# Patient Record
Sex: Male | Born: 1965 | Race: Black or African American | Hispanic: No | Smoking: Former smoker
Health system: Southern US, Community
[De-identification: ages and names within clinical notes are randomized; demographics above are authoritative.]

## PROBLEM LIST (undated history)

## (undated) ENCOUNTER — Emergency Department (HOSPITAL_COMMUNITY): Admission: EM | Payer: Medicare HMO | Source: Home / Self Care

## (undated) DIAGNOSIS — N189 Chronic kidney disease, unspecified: Secondary | ICD-10-CM

## (undated) DIAGNOSIS — E119 Type 2 diabetes mellitus without complications: Secondary | ICD-10-CM

## (undated) DIAGNOSIS — E7800 Pure hypercholesterolemia, unspecified: Secondary | ICD-10-CM

## (undated) DIAGNOSIS — I10 Essential (primary) hypertension: Secondary | ICD-10-CM

## (undated) HISTORY — DX: Chronic kidney disease, unspecified: N18.9

---

## 2018-07-31 HISTORY — PX: TRANSURETHRAL RESECTION OF PROSTATE: SHX73

## 2021-05-16 ENCOUNTER — Observation Stay (HOSPITAL_COMMUNITY): Payer: Medicare HMO

## 2021-05-16 ENCOUNTER — Emergency Department (HOSPITAL_COMMUNITY): Payer: Medicare HMO

## 2021-05-16 ENCOUNTER — Inpatient Hospital Stay (HOSPITAL_COMMUNITY)
Admission: EM | Admit: 2021-05-16 | Discharge: 2021-05-20 | DRG: 065 | Disposition: A | Discharge status: Rehabilitation Facility | Payer: Medicare HMO | Attending: Family Medicine | Admitting: Family Medicine

## 2021-05-16 DIAGNOSIS — N401 Enlarged prostate with lower urinary tract symptoms: Secondary | ICD-10-CM | POA: Diagnosis present

## 2021-05-16 DIAGNOSIS — R29898 Other symptoms and signs involving the musculoskeletal system: Secondary | ICD-10-CM | POA: Diagnosis not present

## 2021-05-16 DIAGNOSIS — G8194 Hemiplegia, unspecified affecting left nondominant side: Secondary | ICD-10-CM | POA: Diagnosis present

## 2021-05-16 DIAGNOSIS — I639 Cerebral infarction, unspecified: Principal | ICD-10-CM

## 2021-05-16 DIAGNOSIS — G4733 Obstructive sleep apnea (adult) (pediatric): Secondary | ICD-10-CM | POA: Diagnosis present

## 2021-05-16 DIAGNOSIS — E1165 Type 2 diabetes mellitus with hyperglycemia: Secondary | ICD-10-CM | POA: Diagnosis not present

## 2021-05-16 DIAGNOSIS — Z833 Family history of diabetes mellitus: Secondary | ICD-10-CM

## 2021-05-16 DIAGNOSIS — I6381 Other cerebral infarction due to occlusion or stenosis of small artery: Secondary | ICD-10-CM | POA: Diagnosis not present

## 2021-05-16 DIAGNOSIS — I421 Obstructive hypertrophic cardiomyopathy: Secondary | ICD-10-CM | POA: Diagnosis not present

## 2021-05-16 DIAGNOSIS — E785 Hyperlipidemia, unspecified: Secondary | ICD-10-CM | POA: Diagnosis present

## 2021-05-16 DIAGNOSIS — I651 Occlusion and stenosis of basilar artery: Secondary | ICD-10-CM | POA: Diagnosis not present

## 2021-05-16 DIAGNOSIS — R1313 Dysphagia, pharyngeal phase: Secondary | ICD-10-CM | POA: Diagnosis present

## 2021-05-16 DIAGNOSIS — E1169 Type 2 diabetes mellitus with other specified complication: Secondary | ICD-10-CM | POA: Diagnosis not present

## 2021-05-16 DIAGNOSIS — Z23 Encounter for immunization: Secondary | ICD-10-CM

## 2021-05-16 DIAGNOSIS — Z7984 Long term (current) use of oral hypoglycemic drugs: Secondary | ICD-10-CM

## 2021-05-16 DIAGNOSIS — I69952 Hemiplegia and hemiparesis following unspecified cerebrovascular disease affecting left dominant side: Secondary | ICD-10-CM | POA: Diagnosis not present

## 2021-05-16 DIAGNOSIS — I6522 Occlusion and stenosis of left carotid artery: Secondary | ICD-10-CM | POA: Diagnosis not present

## 2021-05-16 DIAGNOSIS — Z8249 Family history of ischemic heart disease and other diseases of the circulatory system: Secondary | ICD-10-CM | POA: Diagnosis not present

## 2021-05-16 DIAGNOSIS — E669 Obesity, unspecified: Secondary | ICD-10-CM | POA: Diagnosis not present

## 2021-05-16 DIAGNOSIS — Z6841 Body Mass Index (BMI) 40.0 and over, adult: Secondary | ICD-10-CM | POA: Diagnosis not present

## 2021-05-16 DIAGNOSIS — R471 Dysarthria and anarthria: Secondary | ICD-10-CM | POA: Diagnosis present

## 2021-05-16 DIAGNOSIS — G459 Transient cerebral ischemic attack, unspecified: Secondary | ICD-10-CM | POA: Diagnosis not present

## 2021-05-16 DIAGNOSIS — I517 Cardiomegaly: Secondary | ICD-10-CM | POA: Diagnosis not present

## 2021-05-16 DIAGNOSIS — R300 Dysuria: Secondary | ICD-10-CM | POA: Diagnosis present

## 2021-05-16 DIAGNOSIS — R29705 NIHSS score 5: Secondary | ICD-10-CM | POA: Diagnosis present

## 2021-05-16 DIAGNOSIS — E119 Type 2 diabetes mellitus without complications: Secondary | ICD-10-CM

## 2021-05-16 DIAGNOSIS — R4781 Slurred speech: Secondary | ICD-10-CM | POA: Diagnosis not present

## 2021-05-16 DIAGNOSIS — E1122 Type 2 diabetes mellitus with diabetic chronic kidney disease: Secondary | ICD-10-CM | POA: Diagnosis present

## 2021-05-16 DIAGNOSIS — N1832 Chronic kidney disease, stage 3b: Secondary | ICD-10-CM | POA: Diagnosis not present

## 2021-05-16 DIAGNOSIS — Z4659 Encounter for fitting and adjustment of other gastrointestinal appliance and device: Secondary | ICD-10-CM | POA: Diagnosis not present

## 2021-05-16 DIAGNOSIS — M109 Gout, unspecified: Secondary | ICD-10-CM | POA: Diagnosis not present

## 2021-05-16 DIAGNOSIS — I69354 Hemiplegia and hemiparesis following cerebral infarction affecting left non-dominant side: Secondary | ICD-10-CM

## 2021-05-16 DIAGNOSIS — R062 Wheezing: Secondary | ICD-10-CM | POA: Diagnosis not present

## 2021-05-16 DIAGNOSIS — I69322 Dysarthria following cerebral infarction: Secondary | ICD-10-CM | POA: Diagnosis not present

## 2021-05-16 DIAGNOSIS — R2681 Unsteadiness on feet: Secondary | ICD-10-CM | POA: Diagnosis present

## 2021-05-16 DIAGNOSIS — Z452 Encounter for adjustment and management of vascular access device: Secondary | ICD-10-CM | POA: Diagnosis not present

## 2021-05-16 DIAGNOSIS — R4701 Aphasia: Secondary | ICD-10-CM | POA: Diagnosis present

## 2021-05-16 DIAGNOSIS — Z87891 Personal history of nicotine dependence: Secondary | ICD-10-CM

## 2021-05-16 DIAGNOSIS — G9349 Other encephalopathy: Secondary | ICD-10-CM | POA: Diagnosis present

## 2021-05-16 DIAGNOSIS — I10 Essential (primary) hypertension: Secondary | ICD-10-CM | POA: Diagnosis present

## 2021-05-16 DIAGNOSIS — I6389 Other cerebral infarction: Secondary | ICD-10-CM | POA: Diagnosis not present

## 2021-05-16 DIAGNOSIS — Z751 Person awaiting admission to adequate facility elsewhere: Secondary | ICD-10-CM | POA: Diagnosis not present

## 2021-05-16 DIAGNOSIS — R0689 Other abnormalities of breathing: Secondary | ICD-10-CM | POA: Diagnosis not present

## 2021-05-16 DIAGNOSIS — I129 Hypertensive chronic kidney disease with stage 1 through stage 4 chronic kidney disease, or unspecified chronic kidney disease: Secondary | ICD-10-CM | POA: Diagnosis present

## 2021-05-16 DIAGNOSIS — Z20822 Contact with and (suspected) exposure to covid-19: Secondary | ICD-10-CM | POA: Diagnosis not present

## 2021-05-16 DIAGNOSIS — Z79899 Other long term (current) drug therapy: Secondary | ICD-10-CM

## 2021-05-16 DIAGNOSIS — I69391 Dysphagia following cerebral infarction: Secondary | ICD-10-CM

## 2021-05-16 DIAGNOSIS — E7800 Pure hypercholesterolemia, unspecified: Secondary | ICD-10-CM | POA: Diagnosis present

## 2021-05-16 DIAGNOSIS — Z886 Allergy status to analgesic agent status: Secondary | ICD-10-CM

## 2021-05-16 DIAGNOSIS — I63219 Cerebral infarction due to unspecified occlusion or stenosis of unspecified vertebral arteries: Secondary | ICD-10-CM | POA: Diagnosis not present

## 2021-05-16 DIAGNOSIS — R531 Weakness: Secondary | ICD-10-CM | POA: Diagnosis not present

## 2021-05-16 DIAGNOSIS — I63011 Cerebral infarction due to thrombosis of right vertebral artery: Secondary | ICD-10-CM | POA: Diagnosis not present

## 2021-05-16 HISTORY — DX: Essential (primary) hypertension: I10

## 2021-05-16 HISTORY — DX: Type 2 diabetes mellitus without complications: E11.9

## 2021-05-16 HISTORY — DX: Pure hypercholesterolemia, unspecified: E78.00

## 2021-05-16 LAB — DIFFERENTIAL
Abs Immature Granulocytes: 0.02 10*3/uL (ref 0.00–0.07)
Basophils Absolute: 0.1 10*3/uL (ref 0.0–0.1)
Basophils Relative: 1 %
Eosinophils Absolute: 0.2 10*3/uL (ref 0.0–0.5)
Eosinophils Relative: 3 %
Immature Granulocytes: 0 %
Lymphocytes Relative: 29 %
Lymphs Abs: 2.4 10*3/uL (ref 0.7–4.0)
Monocytes Absolute: 0.7 10*3/uL (ref 0.1–1.0)
Monocytes Relative: 9 %
Neutro Abs: 5 10*3/uL (ref 1.7–7.7)
Neutrophils Relative %: 58 %

## 2021-05-16 LAB — URINALYSIS, ROUTINE W REFLEX MICROSCOPIC
Bacteria, UA: NONE SEEN
Bilirubin Urine: NEGATIVE
Glucose, UA: NEGATIVE mg/dL
Ketones, ur: NEGATIVE mg/dL
Leukocytes,Ua: NEGATIVE
Nitrite: NEGATIVE
Protein, ur: 30 mg/dL — AB
Specific Gravity, Urine: 1.009 (ref 1.005–1.030)
pH: 8 (ref 5.0–8.0)

## 2021-05-16 LAB — CBG MONITORING, ED
Glucose-Capillary: 126 mg/dL — ABNORMAL HIGH (ref 70–99)
Glucose-Capillary: 97 mg/dL (ref 70–99)

## 2021-05-16 LAB — PROTIME-INR
INR: 1 (ref 0.8–1.2)
Prothrombin Time: 13 seconds (ref 11.4–15.2)

## 2021-05-16 LAB — CBC
HCT: 41.6 % (ref 39.0–52.0)
Hemoglobin: 12.8 g/dL — ABNORMAL LOW (ref 13.0–17.0)
MCH: 24.8 pg — ABNORMAL LOW (ref 26.0–34.0)
MCHC: 30.8 g/dL (ref 30.0–36.0)
MCV: 80.5 fL (ref 80.0–100.0)
Platelets: 382 10*3/uL (ref 150–400)
RBC: 5.17 MIL/uL (ref 4.22–5.81)
RDW: 14.9 % (ref 11.5–15.5)
WBC: 8.5 10*3/uL (ref 4.0–10.5)
nRBC: 0 % (ref 0.0–0.2)

## 2021-05-16 LAB — COMPREHENSIVE METABOLIC PANEL
ALT: 17 U/L (ref 0–44)
AST: 24 U/L (ref 15–41)
Albumin: 3.6 g/dL (ref 3.5–5.0)
Alkaline Phosphatase: 56 U/L (ref 38–126)
Anion gap: 7 (ref 5–15)
BUN: 30 mg/dL — ABNORMAL HIGH (ref 6–20)
CO2: 27 mmol/L (ref 22–32)
Calcium: 8.2 mg/dL — ABNORMAL LOW (ref 8.9–10.3)
Chloride: 105 mmol/L (ref 98–111)
Creatinine, Ser: 2.07 mg/dL — ABNORMAL HIGH (ref 0.61–1.24)
GFR, Estimated: 38 mL/min — ABNORMAL LOW (ref >60)
Glucose, Bld: 99 mg/dL (ref 70–99)
Potassium: 3.7 mmol/L (ref 3.5–5.1)
Sodium: 139 mmol/L (ref 135–145)
Total Bilirubin: 0.5 mg/dL (ref 0.3–1.2)
Total Protein: 8 g/dL (ref 6.5–8.1)

## 2021-05-16 LAB — RESP PANEL BY RT-PCR (FLU A&B, COVID) ARPGX2
Influenza A by PCR: NEGATIVE
Influenza B by PCR: NEGATIVE
SARS Coronavirus 2 by RT PCR: NEGATIVE

## 2021-05-16 LAB — ETHANOL: Alcohol, Ethyl (B): <10 mg/dL (ref <10)

## 2021-05-16 LAB — RAPID URINE DRUG SCREEN, HOSP PERFORMED
Amphetamines: NOT DETECTED
Barbiturates: NOT DETECTED
Benzodiazepines: NOT DETECTED
Cocaine: NOT DETECTED
Opiates: NOT DETECTED
Tetrahydrocannabinol: NOT DETECTED

## 2021-05-16 LAB — APTT: aPTT: 26 seconds (ref 24–36)

## 2021-05-16 IMAGING — DX DG ABDOMEN 1V
1 series · 1 of 1 positions shown · non-contrast
Comparison: None.

CLINICAL DATA: Check gastric catheter placement

EXAM:
ABDOMEN - 1 VIEW

[abdomen supine grid]
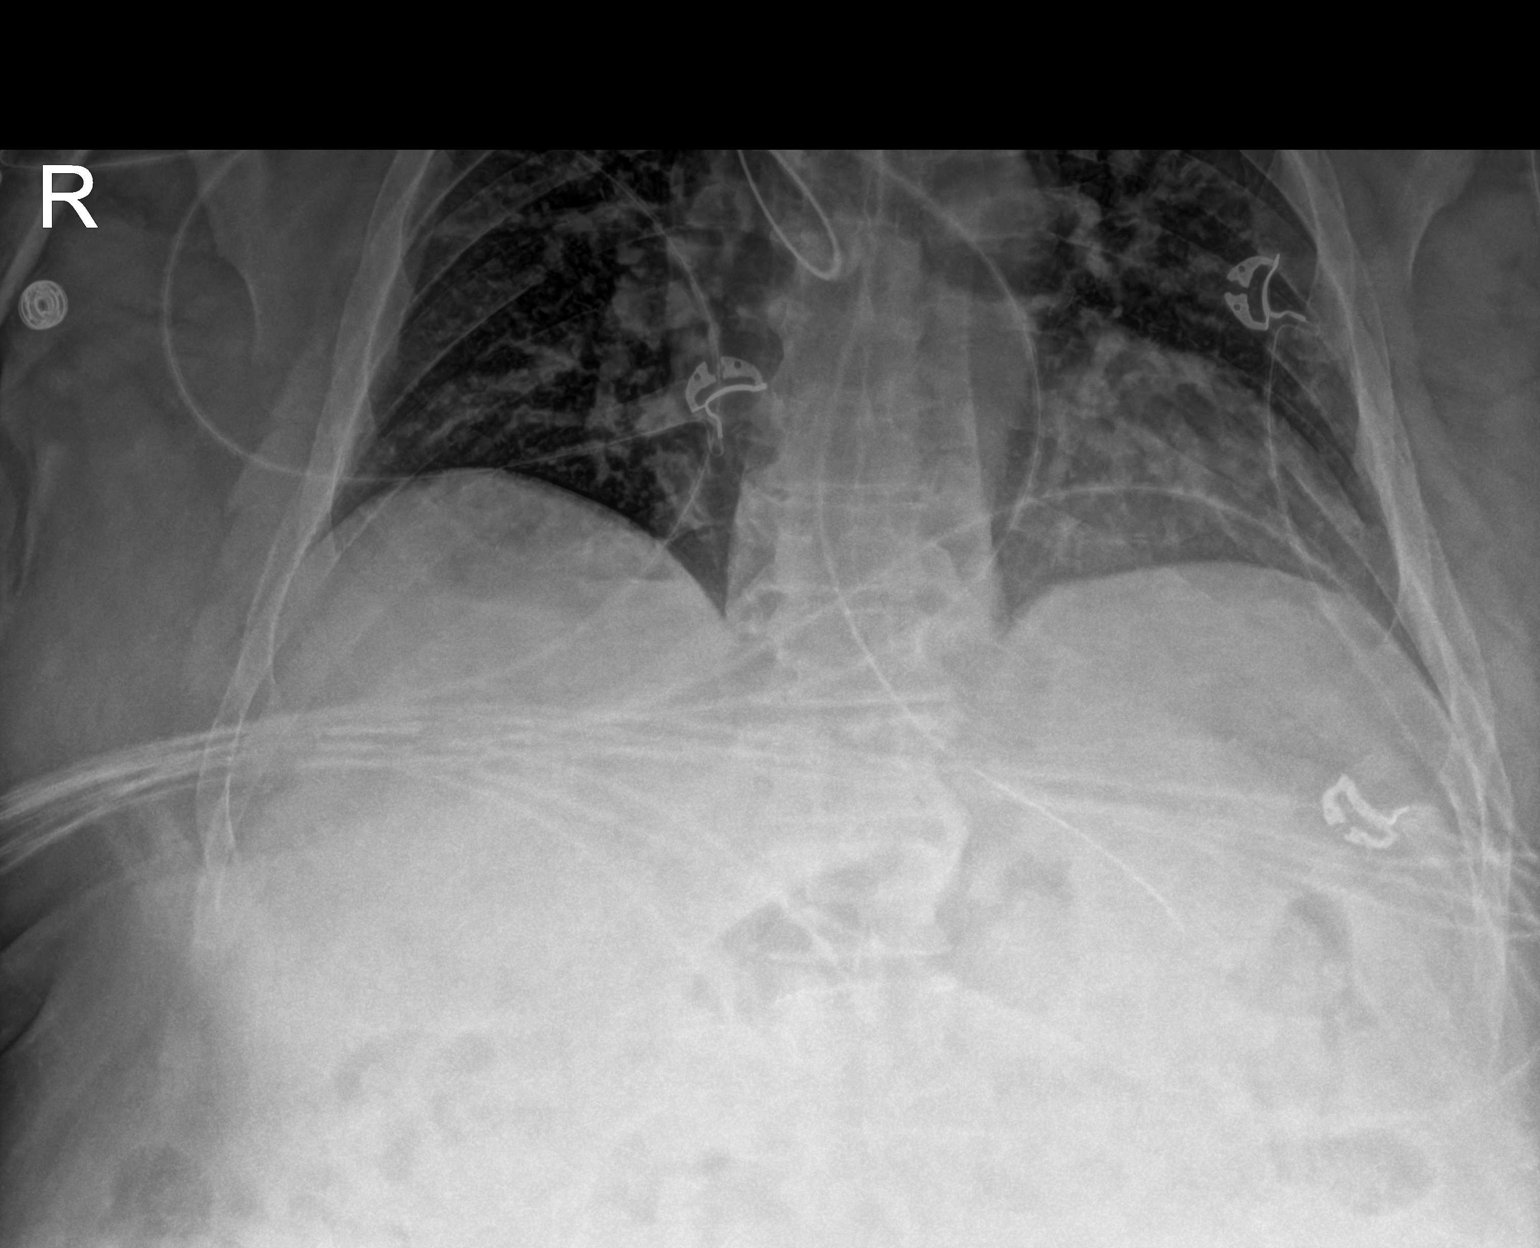

[1 of 1 positions shown; findings below may reference images not displayed]

FINDINGS: Gastric catheter is noted with the tip in the stomach. Proximal side
port is noted at the gastroesophageal junction. This could be
advanced deeper into the stomach. No obstructive changes are noted.
IMPRESSION: Gastric catheter as described. This could be advanced slightly
deeper into the stomach.

## 2021-05-16 IMAGING — DX DG ABDOMEN 1V
1 series · 1 of 1 positions shown · non-contrast
Comparison: Film from earlier in the same day.

CLINICAL DATA: Check gastric catheter placement

EXAM:
ABDOMEN - 1 VIEW

[abdomen supine grid]
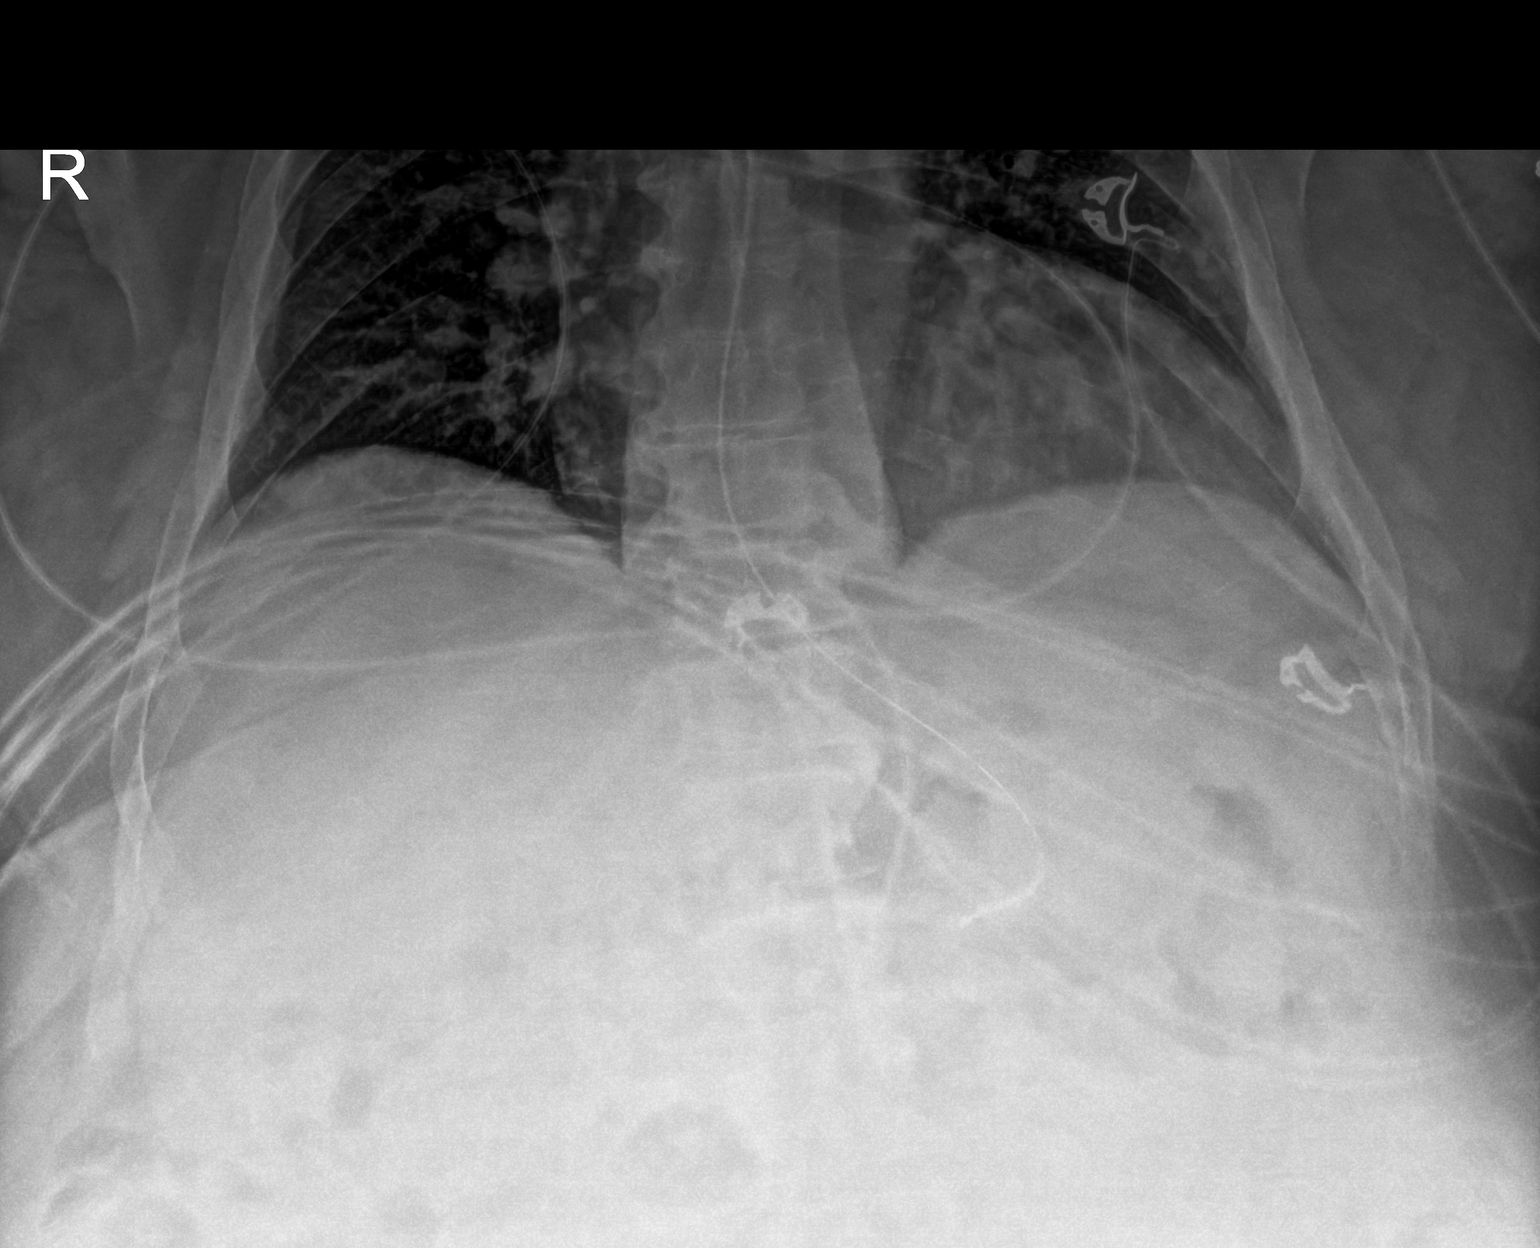

[1 of 1 positions shown; findings below may reference images not displayed]

FINDINGS: Gastric catheter has been advanced. Proximal side port now lies
within the stomach.
IMPRESSION: Gastric catheter in satisfactory position.

## 2021-05-16 MED ORDER — METOPROLOL TARTRATE 5 MG/5ML IV SOLN
2.5000 mg | Freq: Once | INTRAVENOUS | Status: AC
  Administered 2021-05-16: 2.5 mg via INTRAVENOUS
  Filled 2021-05-16: qty 5

## 2021-05-16 MED ORDER — ASPIRIN 300 MG RE SUPP
300.0000 mg | Freq: Once | RECTAL | Status: AC
  Administered 2021-05-16: 300 mg via RECTAL
  Filled 2021-05-16: qty 1

## 2021-05-16 MED ORDER — CLOPIDOGREL BISULFATE 75 MG PO TABS
75.0000 mg | ORAL_TABLET | Freq: Once | ORAL | Status: DC
Start: 2021-05-16 — End: 2021-05-16

## 2021-05-16 MED ORDER — CLOPIDOGREL BISULFATE 75 MG PO TABS
300.0000 mg | ORAL_TABLET | Freq: Every day | ORAL | Status: AC
  Administered 2021-05-17: 300 mg via ORAL
  Filled 2021-05-16: qty 4

## 2021-05-16 MED ORDER — HEPARIN SODIUM (PORCINE) 5000 UNIT/ML IJ SOLN
5000.0000 [IU] | Freq: Three times a day (TID) | INTRAMUSCULAR | Status: DC
  Administered 2021-05-16 – 2021-05-20 (×11): 5000 [IU] via SUBCUTANEOUS
  Filled 2021-05-16 (×11): qty 1

## 2021-05-16 MED ORDER — ACETAMINOPHEN 325 MG PO TABS: 650.0000 mg | ORAL_TABLET | ORAL | Status: DC | PRN

## 2021-05-16 MED ORDER — SODIUM CHLORIDE 0.9 % IV SOLN: INTRAVENOUS | Status: DC

## 2021-05-16 MED ORDER — INSULIN ASPART 100 UNIT/ML IJ SOLN: 0.0000 [IU] | Freq: Every day | INTRAMUSCULAR | Status: DC

## 2021-05-16 MED ORDER — CLOPIDOGREL BISULFATE 75 MG PO TABS
75.0000 mg | ORAL_TABLET | Freq: Every day | ORAL | Status: DC
  Administered 2021-05-18 – 2021-05-20 (×3): 75 mg via NASOGASTRIC
  Filled 2021-05-16 (×3): qty 1

## 2021-05-16 MED ORDER — ACETAMINOPHEN 160 MG/5ML PO SOLN: 650.0000 mg | ORAL | Status: DC | PRN

## 2021-05-16 MED ORDER — ACETAMINOPHEN 650 MG RE SUPP: 650.0000 mg | RECTAL | Status: DC | PRN

## 2021-05-16 MED ORDER — INSULIN ASPART 100 UNIT/ML IJ SOLN
0.0000 [IU] | Freq: Three times a day (TID) | INTRAMUSCULAR | Status: DC
  Administered 2021-05-17 (×2): 1 [IU] via SUBCUTANEOUS
  Filled 2021-05-16 (×2): qty 1

## 2021-05-16 MED ORDER — ASPIRIN 325 MG PO TABS
325.0000 mg | ORAL_TABLET | Freq: Every day | ORAL | Status: DC
  Administered 2021-05-17 – 2021-05-19 (×3): 325 mg via NASOGASTRIC
  Filled 2021-05-16 (×3): qty 1

## 2021-05-16 MED ORDER — CLOPIDOGREL BISULFATE 75 MG PO TABS: 300.0000 mg | ORAL_TABLET | Freq: Every day | ORAL | Status: DC

## 2021-05-16 MED ORDER — STROKE: EARLY STAGES OF RECOVERY BOOK
Freq: Once | Status: AC
  Filled 2021-05-16: qty 1

## 2021-05-16 MED ORDER — CLOPIDOGREL BISULFATE 75 MG PO TABS: 75.0000 mg | ORAL_TABLET | Freq: Every day | ORAL | Status: DC

## 2021-05-16 MED ORDER — ATORVASTATIN CALCIUM 40 MG PO TABS
40.0000 mg | ORAL_TABLET | Freq: Every day | ORAL | Status: DC
  Administered 2021-05-17 – 2021-05-20 (×4): 40 mg via ORAL
  Filled 2021-05-16 (×4): qty 1

## 2021-05-16 MED ORDER — CLOPIDOGREL BISULFATE 75 MG PO TABS: 75.0000 mg | ORAL_TABLET | Freq: Once | ORAL | Status: DC

## 2021-05-16 NOTE — ED Triage Notes (Signed)
Per EMS pt started having aphasia at 12 noon today. Pt states he has left side weakness in his history but today it was worse.   EMS BP 177/115 Bg 121 HR 92

## 2021-05-16 NOTE — H&P (Signed)
TRH H&P    Patient Demographics:    Brandon French, is a 55 y.o. male  MRN: [id number]  DOB - 30-Sep-1965  Admit Date - 05/16/2021  Referring MD/NP/PA: Rhunette Croft  Outpatient Primary MD for the patient is Pcp, No  Patient coming from: Home  Chief complaint- weakness and slurred speech   HPI:    Brandon French  is a 55 y.o. male, with history of DMII, HTN, HLD and more presents to the ED with a chief of weakness and slurred speech.  Patient reports that the onset of symptoms was around 2:30 PM.  He later then said he actually went to sleep at 2:30 PM and woke up at 5:30 PM with slurred speech.  He reports he is never had this problem before.  He has had left upper and lower extremity weakness as well.  He has been drooling as well.  He has had trouble swallowing.  Patient has had a history of stroke and started blood pressure medication after the last stroke.  He is difficult to understand due to slurred and garbled speech, but it sounds like the left-sided weakness was present after the last stroke.  He does think it is worse now though.  Patient reports that he was otherwise in his normal state of health up until this started this afternoon.  No other complaints at this time.  Patient does not smoke, does not drink, does not use illicit drugs.  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 UA and UDS pending Patient failed swallow eval Neurology consulted recommended admission here at [facility] as patient is out of the stroke window, no CTA given creatinine, do MRI brain and MRA of brain and neck tomorrow, echo  tomorrow, stroke labs   Review of systems:    In addition to the HPI above,  No Fever-chills, Admits to mild headache, No changes with Vision or hearing, No Chest pain, Cough or Shortness of Breath, No Abdominal pain, No Nausea or Vomiting, bowel movements are regular, No Blood in stool or Urine, No dysuria, No new skin rashes or bruises, No new joints pains-aches,  No new weakness, tingling, numbness in any extremity, No recent weight gain or loss, No polyuria, polydypsia or polyphagia, No significant Mental Stressors.  All other systems reviewed and are negative.    Past History of the following :    No past medical history on file.     Past medical history including diabetes mellitus type 2, hypertension, hyperlipidemia   Social History:      Social History   Tobacco Use   Smoking status: Not on file   Smokeless tobacco: Not on file  Substance Use Topics   Alcohol use: Not on file  Family History :    No family history on file. Family history of hypertension   Home Medications:   Prior to Admission medications   Medication Sig Start Date End Date Taking? Authorizing Provider  cloNIDine (CATAPRES) 0.1 MG tablet Take 0.1 mg by mouth 2 (two) times daily.   Yes [provider]  hydrochlorothiazide (HYDRODIURIL) 25 MG tablet Take 25 mg by mouth daily.   Yes [provider]  metFORMIN (GLUCOPHAGE) 500 MG tablet Take 500 mg by mouth daily with breakfast. 03/06/19  Yes [provider]  atorvastatin (LIPITOR) 40 MG tablet Take 1 tablet by mouth daily. Patient not taking: No sig reported 03/06/19   [provider]  finasteride (PROSCAR) 5 MG tablet Take by mouth. Patient not taking: No sig reported 03/29/19   [provider]  metoprolol succinate (TOPROL-XL) 100 MG 24 hr tablet Take 1 tablet by mouth daily. Patient not taking: No sig reported 03/06/19   [provider]  tamsulosin (FLOMAX) 0.4 MG CAPS capsule  Take 1 capsule by mouth daily. Patient not taking: No sig reported 03/29/19   [provider]     Allergies:     Allergies  Allergen Reactions   Aleve [Naproxen]      Physical Exam:   Vitals  Blood pressure (!) 208/115, pulse 94, temperature 98.3 F (36.8 C), temperature source Oral, resp. rate (!) 21, height 6' (1.829 m), weight (!) 145.2 kg, SpO2 94 %.  1.  General: Patient lying supine in bed,  no acute distress   2. Psychiatric: Alert and oriented x 3, mood and behavior normal for situation, pleasant and cooperative with exam   3. Neurologic: Speech and language are slurred and garbled, minimal left facial droop, moves all 4 extremities voluntarily, left upper and lower extremity minimally weaker than right upper and lower extremity, left side 4 out of 5 strength     4. HEENMT:  Head is atraumatic, normocephalic, pupils reactive to light, neck is supple, trachea is midline, mucous membranes are moist, drooling   5. Respiratory : Lungs are clear to auscultation bilaterally without wheezing, rhonchi, rales, no cyanosis, no increase in work of breathing or accessory muscle use   6. Cardiovascular : Heart rate normal, rhythm is regular, no murmurs, rubs or gallops, no peripheral edema, peripheral pulses palpated   7. Gastrointestinal:  Abdomen is soft, nondistended, nontender to palpation bowel sounds active, no masses or organomegaly palpated   8. Skin:  Skin is warm, dry and intact without rashes, acute lesions, or ulcers on limited exam   9.Musculoskeletal:  No acute deformities or trauma, no asymmetry in tone, no peripheral edema, peripheral pulses palpated, no tenderness to palpation in the extremities     Data Review:    CBC Recent Labs  Lab 05/16/21 1834  WBC 8.5  HGB 12.8*  HCT 41.6  PLT 382  MCV 80.5  MCH 24.8*  MCHC 30.8  RDW 14.9  LYMPHSABS 2.4  MONOABS 0.7  EOSABS 0.2  BASOSABS 0.1    ------------------------------------------------------------------------------------------------------------------  Results for orders placed or performed during the hospital encounter of 05/16/21 (from the past 48 hour(s))  Ethanol     Status: None   Collection Time: 05/16/21  6:34 PM  Result Value Ref Range   Alcohol, Ethyl (B) <10 <10 mg/dL    Comment: (NOTE) Lowest detectable limit for serum alcohol is 10 mg/dL.  For medical purposes only. Performed at [facility], [address], [place], [place] [address]   Protime-INR  Status: None   Collection Time: 05/16/21  6:34 PM  Result Value Ref Range   Prothrombin Time 13.0 11.4 - 15.2 seconds   INR 1.0 0.8 - 1.2    Comment: (NOTE) INR goal varies based on device and disease states. Performed at [facility], [address] [place], [place] [address]   APTT     Status: None   Collection Time: 05/16/21  6:34 PM  Result Value Ref Range   aPTT 26 24 - 36 seconds    Comment: Performed at [facility], [address], [place], [place] [address]  CBC     Status: Abnormal   Collection Time: 05/16/21  6:34 PM  Result Value Ref Range   WBC 8.5 4.0 - 10.5 K/uL   RBC 5.17 4.22 - 5.81 MIL/uL   Hemoglobin 12.8 (L) 13.0 - 17.0 g/dL   HCT 09.8 11.9 - 14.7 %   MCV 80.5 80.0 - 100.0 fL   MCH 24.8 (L) 26.0 - 34.0 pg   MCHC 30.8 30.0 - 36.0 g/dL   RDW 82.9 56.2 - 13.0 %   Platelets 382 150 - 400 K/uL   nRBC 0.0 0.0 - 0.2 %    Comment: Performed at [facility], [address], [place], [place] [address]  Differential     Status: None   Collection Time: 05/16/21  6:34 PM  Result Value Ref Range   Neutrophils Relative % 58 %   Neutro Abs 5.0 1.7 - 7.7 K/uL   Lymphocytes Relative 29 %   Lymphs Abs 2.4 0.7 - 4.0 K/uL   Monocytes Relative 9 %   Monocytes Absolute 0.7 0.1 - 1.0 K/uL   Eosinophils Relative 3 %   Eosinophils Absolute 0.2 0.0 - 0.5 K/uL   Basophils Relative 1 %   Basophils Absolute 0.1 0.0 - 0.1 K/uL   Immature  Granulocytes 0 %   Abs Immature Granulocytes 0.02 0.00 - 0.07 K/uL    Comment: Performed at [facility], [address], [place], [place] [address]  Comprehensive metabolic panel     Status: Abnormal   Collection Time: 05/16/21  6:34 PM  Result Value Ref Range   Sodium 139 135 - 145 mmol/L   Potassium 3.7 3.5 - 5.1 mmol/L   Chloride 105 98 - 111 mmol/L   CO2 27 22 - 32 mmol/L   Glucose, Bld 99 70 - 99 mg/dL    Comment: Glucose reference range applies only to samples taken after fasting for at least 8 hours.   BUN 30 (H) 6 - 20 mg/dL   Creatinine, Ser 9.52 (H) 0.61 - 1.24 mg/dL   Calcium 8.2 (L) 8.9 - 10.3 mg/dL   Total Protein 8.0 6.5 - 8.1 g/dL   Albumin 3.6 3.5 - 5.0 g/dL   AST 24 15 - 41 U/L   ALT 17 0 - 44 U/L   Alkaline Phosphatase 56 38 - 126 U/L   Total Bilirubin 0.5 0.3 - 1.2 mg/dL   GFR, Estimated 38 (L) >60 mL/min    Comment: (NOTE) Calculated using the CKD-EPI Creatinine Equation (2021)    Anion gap 7 5 - 15    Comment: Performed at [facility], [address], [place], [place] [address]  CBG monitoring, ED     Status: None   Collection Time: 05/16/21  6:37 PM  Result Value Ref Range   Glucose-Capillary 97 70 - 99 mg/dL    Comment: Glucose reference range applies only to samples taken after fasting for  at least 8 hours.  Urine rapid drug screen (hosp performed)     Status: None   Collection Time: 05/16/21  6:39 PM  Result Value Ref Range   Opiates NONE DETECTED NONE DETECTED   Cocaine NONE DETECTED NONE DETECTED   Benzodiazepines NONE DETECTED NONE DETECTED   Amphetamines NONE DETECTED NONE DETECTED   Tetrahydrocannabinol NONE DETECTED NONE DETECTED   Barbiturates NONE DETECTED NONE DETECTED    Comment: (NOTE) DRUG SCREEN FOR MEDICAL PURPOSES ONLY.  IF CONFIRMATION IS NEEDED FOR ANY PURPOSE, NOTIFY LAB WITHIN 5 DAYS.  LOWEST DETECTABLE LIMITS FOR URINE DRUG SCREEN Drug Class                     Cutoff (ng/mL) Amphetamine and metabolites     1000 Barbiturate and metabolites    200 Benzodiazepine                 200 Tricyclics and metabolites     300 Opiates and metabolites        300 Cocaine and metabolites        300 THC                            50 Performed at [facility], [address], [place], [place] [address]   Urinalysis, Routine w reflex microscopic Urine, Clean Catch     Status: Abnormal   Collection Time: 05/16/21  6:39 PM  Result Value Ref Range   Color, Urine STRAW (A) YELLOW   APPearance CLEAR CLEAR   Specific Gravity, Urine 1.009 1.005 - 1.030   pH 8.0 5.0 - 8.0   Glucose, UA NEGATIVE NEGATIVE mg/dL   Hgb urine dipstick SMALL (A) NEGATIVE   Bilirubin Urine NEGATIVE NEGATIVE   Ketones, ur NEGATIVE NEGATIVE mg/dL   Protein, ur 30 (A) NEGATIVE mg/dL   Nitrite NEGATIVE NEGATIVE   Leukocytes,Ua NEGATIVE NEGATIVE   RBC / HPF 0-5 0 - 5 RBC/hpf   WBC, UA 0-5 0 - 5 WBC/hpf   Bacteria, UA NONE SEEN NONE SEEN   Squamous Epithelial / LPF 0-5 0 - 5    Comment: Performed at [facility], [address] [place], [place] [address]  Resp Panel by RT-PCR (Flu A&B, Covid) Nasopharyngeal Swab     Status: None   Collection Time: 05/16/21  7:35 PM   Specimen: Nasopharyngeal Swab; Nasopharyngeal(NP) swabs in vial transport medium  Result Value Ref Range   SARS Coronavirus 2 by RT PCR NEGATIVE NEGATIVE    Comment: (NOTE) SARS-CoV-2 target nucleic acids are NOT DETECTED.  The SARS-CoV-2 RNA is generally detectable in upper respiratory specimens during the acute phase of infection. The lowest concentration of SARS-CoV-2 viral copies this assay can detect is 138 copies/mL. A negative result does not preclude SARS-Cov-2 infection and should not be used as the sole basis for treatment or other patient management decisions. A negative result may occur with  improper specimen collection/handling, submission of specimen other than nasopharyngeal swab, presence of viral mutation(s) within the areas targeted by this assay,  and inadequate number of viral copies(<138 copies/mL). A negative result must be combined with clinical observations, patient history, and epidemiological information. The expected result is Negative.  Fact Sheet for Patients:  BloggerCourse.com  Fact Sheet for Healthcare Providers:  SeriousBroker.it  This test is no t yet approved or cleared by the [place] FDA and  has been authorized for detection and/or diagnosis of SARS-CoV-2 by  FDA under an Emergency Use Authorization (EUA). This EUA will remain  in effect (meaning this test can be used) for the duration of the COVID-19 declaration under Section 564(b)(1) of the Act, 21 U.S.C.section 360bbb-3(b)(1), unless the authorization is terminated  or revoked sooner.       Influenza A by PCR NEGATIVE NEGATIVE   Influenza B by PCR NEGATIVE NEGATIVE    Comment: (NOTE) The Xpert Xpress SARS-CoV-2/FLU/RSV plus assay is intended as an aid in the diagnosis of influenza from Nasopharyngeal swab specimens and should not be used as a sole basis for treatment. Nasal washings and aspirates are unacceptable for Xpert Xpress SARS-CoV-2/FLU/RSV testing.  Fact Sheet for Patients: BloggerCourse.com  Fact Sheet for Healthcare Providers: SeriousBroker.it  This test is not yet approved or cleared by the [place] FDA and has been authorized for detection and/or diagnosis of SARS-CoV-2 by FDA under an Emergency Use Authorization (EUA). This EUA will remain in effect (meaning this test can be used) for the duration of the COVID-19 declaration under Section 564(b)(1) of the Act, 21 U.S.C. section 360bbb-3(b)(1), unless the authorization is terminated or revoked.  Performed at [facility], [address], [place], [place] [address]     Chemistries  Recent Labs  Lab 05/16/21 1834  NA 139  K 3.7  CL 105  CO2 27  GLUCOSE 99  BUN 30*   CREATININE 2.07*  CALCIUM 8.2*  AST 24  ALT 17  ALKPHOS 56  BILITOT 0.5   ------------------------------------------------------------------------------------------------------------------  ------------------------------------------------------------------------------------------------------------------ GFR: Estimated Creatinine Clearance: 61.1 mL/min (A) (by C-G formula based on SCr of 2.07 mg/dL (H)). Liver Function Tests: Recent Labs  Lab 05/16/21 1834  AST 24  ALT 17  ALKPHOS 56  BILITOT 0.5  PROT 8.0  ALBUMIN 3.6   No results for input(s): LIPASE, AMYLASE in the last 168 hours. No results for input(s): AMMONIA in the last 168 hours. Coagulation Profile: Recent Labs  Lab 05/16/21 1834  INR 1.0   Cardiac Enzymes: No results for input(s): CKTOTAL, CKMB, CKMBINDEX, TROPONINI in the last 168 hours. BNP (last 3 results) No results for input(s): PROBNP in the last 8760 hours. HbA1C: No results for input(s): HGBA1C in the last 72 hours. CBG: Recent Labs  Lab 05/16/21 1837  GLUCAP 97   Lipid Profile: No results for input(s): CHOL, HDL, LDLCALC, TRIG, CHOLHDL, LDLDIRECT in the last 72 hours. Thyroid Function Tests: No results for input(s): TSH, T4TOTAL, FREET4, T3FREE, THYROIDAB in the last 72 hours. Anemia Panel: No results for input(s): VITAMINB12, FOLATE, FERRITIN, TIBC, IRON, RETICCTPCT in the last 72 hours.  --------------------------------------------------------------------------------------------------------------- Urine analysis:    Component Value Date/Time   COLORURINE STRAW (A) 05/16/2021 1839   APPEARANCEUR CLEAR 05/16/2021 1839   LABSPEC 1.009 05/16/2021 1839   PHURINE 8.0 05/16/2021 1839   GLUCOSEU NEGATIVE 05/16/2021 1839   HGBUR SMALL (A) 05/16/2021 1839   BILIRUBINUR NEGATIVE 05/16/2021 1839   KETONESUR NEGATIVE 05/16/2021 1839   PROTEINUR 30 (A) 05/16/2021 1839   NITRITE NEGATIVE 05/16/2021 1839   LEUKOCYTESUR NEGATIVE 05/16/2021 1839       Imaging Results:    DG Abdomen 1 View  Result Date: 05/16/2021 CLINICAL DATA:  Check gastric catheter placement EXAM: ABDOMEN - 1 VIEW COMPARISON:  None. FINDINGS: Gastric catheter is noted with the tip in the stomach. Proximal side port is noted at the gastroesophageal junction. This could be advanced deeper into the stomach. No obstructive changes are noted. IMPRESSION: Gastric catheter as described. This could be advanced slightly deeper into  the stomach. Electronically Signed   By: Alcide Clever M.D.   On: 05/16/2021 21:59   CT HEAD CODE STROKE WO CONTRAST  Result Date: 05/16/2021 CLINICAL DATA:  Code stroke. Neuro deficit, acute, stroke suspected. Left-sided weakness and slurred speech beginning today. EXAM: CT HEAD WITHOUT CONTRAST TECHNIQUE: Contiguous axial images were obtained from the base of the skull through the vertex without intravenous contrast. COMPARISON:  None. FINDINGS: Brain: There is low-density in the left cerebellum that could be an old or subacute infarction. Cerebral hemispheres show a few old small vessel insults within the thalami, basal ganglia and cerebral hemispheric white matter. No sign of large vessel territory infarction. No mass, hemorrhage, hydrocephalus or extra-axial collection. Vascular: No abnormal vascular finding. Skull: Negative Sinuses/Orbits: Sinuses are clear.  Orbits are negative. Other: Left mastoid effusion. ASPECTS [facility] Stroke Program Early CT Score) - Ganglionic level infarction (caudate, lentiform nuclei, internal capsule, insula, M1-M3 cortex): 7 - Supraganglionic infarction (M4-M6 cortex): 3 Total score (0-10 with 10 being normal): 10 IMPRESSION: 1. No acute infarction identified. 2. 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 Old small vessel infarctions of the thalami, basal ganglia and hemispheric white matter. 3. ASPECTS is 10. 4. These results were called by telephone at the time of interpretation on 05/16/2021 at 6:53 pm to provider [facility] ,  who verbally acknowledged these results. Electronically Signed   By: Paulina Fusi M.D.   On: 05/16/2021 18:55     Assessment & Plan:    Active Problems:   Stroke (HCC)   DMII (diabetes mellitus, type 2) (HCC)   HTN (hypertension)   HLD (hyperlipidemia)   CVA MRI Brain in the AM MRA head and neck Failed swallow study NG tube placed NPO ST/PT/OT consult Echo in the AM Stroke labs per neuro recs Continue to monitor DMII Hgb A1C Hold metformin Sliding scale coverage NPO HTN Permissive HTN Hold catapres, HCTZ, and metoprolol Goal < 220 Patient did reach a systolic blood pressure up to 841Y on multiple checks, was given 2-1/2 mg of metoprolol x1 and blood pressure is now 194 systolic HLD Continue statin     DVT Prophylaxis-   Heparin- SCDs   AM Labs Ordered, also please review Full Orders  Family Communication: No family at bedside, attempted to relay message from patient to family member on patient's cell phone, but could not understand what patient was trying to say.  Asked family member if there are any questions, they said they were going to hang up and call another family member.  Code Status: Full  Admission status: Observation Time spent in minutes : 65   Jamel Dunton B Zierle-Ghosh DO

## 2021-05-16 NOTE — ED Provider Notes (Signed)
[facility] EMERGENCY DEPARTMENT Provider Note   CSN: [id number] Arrival date & time: 05/16/21  1835     History Chief Complaint  Patient presents with   Aphasia    QUAVON KEISLING is a 55 y.o. male.  HPI    55 year old male with history of CKD, diabetes, hypertension, hyperlipidemia, stroke with left-sided deficits and obesity comes in with chief complaint of left-sided weakness.  According to the patient, he was last known normal around 12:30 PM when he went to bed.  When he woke up at 5:00 pm he noted left-sided weakness and difficulty with his speech.  He denies any focal numbness, weakness.  Patient denies any dizziness, vision change.  No past medical history on file.  Patient Active Problem List   Diagnosis Date Noted   Stroke [facility]) 05/16/2021      No family history on file.     Home Medications Prior to Admission medications   Medication Sig Start Date End Date Taking? Authorizing Provider  cloNIDine (CATAPRES) 0.1 MG tablet Take 0.1 mg by mouth 2 (two) times daily.   Yes [provider]  hydrochlorothiazide (HYDRODIURIL) 25 MG tablet Take 25 mg by mouth daily.   Yes [provider]  metFORMIN (GLUCOPHAGE) 500 MG tablet Take 500 mg by mouth daily with breakfast. 03/06/19  Yes [provider]  atorvastatin (LIPITOR) 40 MG tablet Take 1 tablet by mouth daily. Patient not taking: No sig reported 03/06/19   [provider]  finasteride (PROSCAR) 5 MG tablet Take by mouth. Patient not taking: No sig reported 03/29/19   [provider]  metoprolol succinate (TOPROL-XL) 100 MG 24 hr tablet Take 1 tablet by mouth daily. Patient not taking: No sig reported 03/06/19   [provider]  tamsulosin (FLOMAX) 0.4 MG CAPS capsule Take 1 capsule by mouth daily. Patient not taking: No sig reported 03/29/19   [provider]    Allergies    Aleve [naproxen]  Review of Systems   Review of Systems  Constitutional:   Positive for activity change.  Neurological:  Positive for speech difficulty and weakness.  All other systems reviewed and are negative.  Physical Exam Updated Vital Signs BP (!) 190/122   Pulse (!) 103   Temp 98.3 F (36.8 C) (Oral)   Resp 14   Ht 6' (1.829 m)   Wt (!) 145.2 kg   SpO2 93%   BMI 43.40 kg/m   Physical Exam Vitals and nursing note reviewed.  Constitutional:      Appearance: He is well-developed.  HENT:     Head: Normocephalic and atraumatic.  Eyes:     Extraocular Movements: Extraocular movements intact.     Pupils: Pupils are equal, round, and reactive to light.     Comments: Horizontal nystagmus  Neck:     Vascular: No JVD.  Cardiovascular:     Rate and Rhythm: Normal rate and regular rhythm.  Pulmonary:     Effort: Pulmonary effort is normal. No respiratory distress.     Breath sounds: Normal breath sounds. No wheezing.  Abdominal:     General: Bowel sounds are normal. There is no distension.     Palpations: Abdomen is soft.     Tenderness: There is no abdominal tenderness.  Musculoskeletal:     Cervical back: Normal range of motion and neck supple.  Skin:    General: Skin is warm and dry.  Neurological:     Mental Status: He is alert and oriented to  person, place, and time.     Motor: Weakness present.     Comments: Slurred speech, left upper extremity has a drift, left lower extremity has drift    ED Results / Procedures / Treatments   Labs (all labs ordered are listed, but only abnormal results are displayed) Labs Reviewed  CBC - Abnormal; Notable for the following components:      Result Value   Hemoglobin 12.8 (*)    MCH 24.8 (*)    All other components within normal limits  COMPREHENSIVE METABOLIC PANEL - Abnormal; Notable for the following components:   BUN 30 (*)    Creatinine, Ser 2.07 (*)    Calcium 8.2 (*)    GFR, Estimated 38 (*)    All other components within normal limits  RESP PANEL BY RT-PCR (FLU A&B, COVID) ARPGX2   ETHANOL  PROTIME-INR  APTT  DIFFERENTIAL  RAPID URINE DRUG SCREEN, HOSP PERFORMED  URINALYSIS, ROUTINE W REFLEX MICROSCOPIC  HIV ANTIBODY (ROUTINE TESTING W REFLEX)  LIPID PANEL  HEMOGLOBIN A1C  COMPREHENSIVE METABOLIC PANEL  CBC  CBG MONITORING, ED    EKG EKG Interpretation  Date/Time:  Monday May 16 2021 19:05:23 EDT Ventricular Rate:  91 PR Interval:  184 QRS Duration: 127 QT Interval:  416 QTC Calculation: 512 R Axis:   71 Text Interpretation: Sinus rhythm IVCD, consider atypical RBBB No acute changes No old tracing to compare Confirmed by Derwood Kaplan [phone]) on 05/16/2021 7:30:28 PM  Radiology CT HEAD CODE STROKE WO CONTRAST  Result Date: 05/16/2021 CLINICAL DATA:  Code stroke. Neuro deficit, acute, stroke suspected. Left-sided weakness and slurred speech beginning today. EXAM: CT HEAD WITHOUT CONTRAST TECHNIQUE: Contiguous axial images were obtained from the base of the skull through the vertex without intravenous contrast. COMPARISON:  None. FINDINGS: Brain: There is low-density in the left cerebellum that could be an old or subacute infarction. Cerebral hemispheres show a few old small vessel insults within the thalami, basal ganglia and cerebral hemispheric white matter. No sign of large vessel territory infarction. No mass, hemorrhage, hydrocephalus or extra-axial collection. Vascular: No abnormal vascular finding. Skull: Negative Sinuses/Orbits: Sinuses are clear.  Orbits are negative. Other: Left mastoid effusion. ASPECTS [facility] Stroke Program Early CT Score) - Ganglionic level infarction (caudate, lentiform nuclei, internal capsule, insula, M1-M3 cortex): 7 - Supraganglionic infarction (M4-M6 cortex): 3 Total score (0-10 with 10 being normal): 10 IMPRESSION: 1. No acute infarction identified. 2. 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 Old small vessel infarctions of the thalami, basal ganglia and hemispheric white matter. 3. ASPECTS is 10. 4. These results were called by  telephone at the time of interpretation on 05/16/2021 at 6:53 pm to provider [facility] , who verbally acknowledged these results. Electronically Signed   By: Paulina Fusi M.D.   On: 05/16/2021 18:55    Procedures Procedures   Medications Ordered in ED Medications   stroke: mapping our early stages of recovery book (has no administration in time range)  0.9 %  sodium chloride infusion (has no administration in time range)  acetaminophen (TYLENOL) tablet 650 mg (has no administration in time range)    Or  acetaminophen (TYLENOL) 160 MG/5ML solution 650 mg (has no administration in time range)    Or  acetaminophen (TYLENOL) suppository 650 mg (has no administration in time range)  heparin injection 5,000 Units (has no administration in time range)  aspirin suppository 300 mg (has no administration in time range)  insulin aspart (novoLOG) injection 0-9 Units (has no administration in  time range)  insulin aspart (novoLOG) injection 0-5 Units (has no administration in time range)    ED Course  I have reviewed the triage vital signs and the nursing notes.  Pertinent labs & imaging results that were available during my care of the patient were reviewed by me and considered in my medical decision making (see chart for details).    MDM Rules/Calculators/A&P                           55 year old male comes in with chief complaint of left-sided weakness.  He has history of strokes, CKD, hypertension, hyperlipidemia. He has had history of stroke with left-sided weakness.  Currently, is reporting that his weakness is worse than usual and he is also having slurred speech. Zenaida Niece screen is negative.  Patient is just outside of tPA window, however code stroke was continued to see if patient was a candidate for any other treatment.  Neurology has assessed the patient independently.  They recommend that he be admitted to the hospital at [facility] for further work-up.  Allowing permissive  hypertension at this time. Neuro team did ask that we offer patient Plavix via NG tube as he failed oral challenge.  Patient is amenable to that approach.  Medicine to admit.  Final Clinical Impression(s) / ED Diagnoses Final diagnoses:  Acute ischemic stroke [facility])    Rx / DC Orders ED Discharge Orders     None        Derwood Kaplan, MD 05/16/21 2100

## 2021-05-16 NOTE — Consult Note (Signed)
[facility] Telemedicine Consult   Requesting Provider: Derwood Kaplan Consult Participants: Myself, Bedside nurse Georgiann Hahn, patient, atrium nurse Location of the provider: [place], [place] Location of the patient: [facility]   This consult was provided via telemedicine with 2-way video and audio communication. The patient/family was informed that care would be provided in this way and agreed to receive care in this manner.    Chief Complaint: New onset severe dysarthria and worsened left sided weakness  HPI: Mr. Towell is a 55 year old gentleman with past medical history significant for stroke risk factors of obesity (BMI 43.4), prior stroke with residual left leg weakness, hypertension, hyperlipidemia, diabetes, CKD stage IIIb, BPH.  Caveat, history is somewhat challenging to obtain given his significant dysarthria and limitations of the televideo format.  Initial last known well was reported as noon but then the patient clarified that he went to sleep at about 2:30 PM and awoke at 5:30 PM with new dysarthria that he has never had before.  He also does feel like his left leg weakness may be worse than normal although he gives variable answers when asked this question.  He denies taking any blood thinners, or even aspirin though he denies any recent bleeding issues that would have required him to stop aspirin.  He notes only that he was started on blood pressure medication after his last prior stroke.  He denies any vision difficulties, any excessive sleepiness or loss of consciousness, or any other new focal weakness other than his severe dysarthria and worsened left-sided weakness, particularly left leg weakness. Denies bleeding issues or signs/symptoms of infection   LKW: 2:30 PM tpa given?: No, out of the window IR Thrombectomy? No, exam not consistent with large vessel occlusion Modified Rankin Scale: 1-No significant post stroke disability and can perform usual duties with  stroke symptoms Time of teleneurologist evaluation: 7:10 PM  Exam: Vitals:   05/16/21 1900 05/16/21 1930  BP: (!) 189/98 (!) 190/122  Pulse: 89 (!) 103  Resp: (!) 21 14  Temp:    SpO2: 99% 93%    General: Mildly frustrated but no acute distress Pulmonary: breathing comfortably Cardiac: regular rate and rhythm on monitor, occasional PVCs  NIH Stroke scale 1A: Level of Consciousness - 0 1B: Ask Month and Age - 0 1C: 'Blink Eyes' & 'Squeeze Hands' - 0 2: Test Horizontal Extraocular Movements - 0 3: Test Visual Fields - 0 4: Test Facial Palsy - 1 mild right NLF 5A: Test Left Arm Motor Drift - 0 5B: Test Right Arm Motor Drift - 0 6A: Test Left Leg Motor Drift - 1 (left is notably worse than the right, but does not hit the bed) 6B: Test Right Leg Motor Drift - 1 7: Test Limb Ataxia - 0 (within limits of left leg weakness) 8: Test Sensation - 0 9: Test Language/Aphasia- 1 reports feather is "leaf", but able to name and repeat otherwise 10: Test Dysarthria - 2 11: Test Extinction/Inattention - 0 NIHSS score: 5   Imaging Reviewed:  Head CT personally reviewed, agree with radiology: 1. No acute infarction identified. 2. 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 Old small vessel infarctions of the thalami, basal ganglia and hemispheric white matter. 3. ASPECTS is 10.  Labs reviewed in epic and pertinent values follow: Creatinine 2.07 which is at his baseline in care everywhere, GFR 38  Assessment: Likely lacunar stroke given sudden onset of symptoms and history of multiple stroke risk factors as above.  Unfortunately out of the acute treatment window, but  merits admission for work-up of stroke etiology and medication optimization  Recommendations:   #Likely lacunar stroke  - Stroke labs TSH, HgbA1c, fasting lipid panel - MRI brain  - MRA of the brain without contrast and MRA neck w/wo  - Frequent neuro checks - Echocardiogram - Prophylactic therapy-Antiplatelet med: Aspirin - dose 325mg   PO or 300mg  PR, followed by 81 mg daily - Consider Plavix 300 mg load with 75 mg daily for 21 - 90 day course with NGT given failed bedside swallow - Risk factor modification - Telemetry monitoring  - Blood pressure goal   - Permissive hypertension to 220/120 for 24-48 hours - PT consult, OT consult, Speech consult, unless patient is back to baseline - Dr. to follow  This patient is receiving care for possible acute neurological changes. There was 35 minutes of care by this provider at the time of service, including time for direct evaluation via telemedicine, review of medical records, imaging studies and discussion of findings with providers, the patient and/or family.  MD-PhD [facility] [phone] If 8pm-8am, please page neurology on call as listed in AMION.

## 2021-05-17 ENCOUNTER — Other Ambulatory Visit: Payer: Self-pay

## 2021-05-17 ENCOUNTER — Observation Stay (HOSPITAL_COMMUNITY): Payer: Medicare HMO

## 2021-05-17 ENCOUNTER — Encounter (HOSPITAL_COMMUNITY): Payer: Self-pay | Admitting: Internal Medicine

## 2021-05-17 ENCOUNTER — Other Ambulatory Visit (HOSPITAL_COMMUNITY): Payer: Medicare HMO

## 2021-05-17 DIAGNOSIS — I10 Essential (primary) hypertension: Secondary | ICD-10-CM | POA: Diagnosis not present

## 2021-05-17 DIAGNOSIS — R531 Weakness: Secondary | ICD-10-CM | POA: Diagnosis not present

## 2021-05-17 DIAGNOSIS — R471 Dysarthria and anarthria: Secondary | ICD-10-CM | POA: Diagnosis present

## 2021-05-17 DIAGNOSIS — I6381 Other cerebral infarction due to occlusion or stenosis of small artery: Secondary | ICD-10-CM | POA: Diagnosis present

## 2021-05-17 DIAGNOSIS — I639 Cerebral infarction, unspecified: Secondary | ICD-10-CM | POA: Diagnosis present

## 2021-05-17 DIAGNOSIS — R300 Dysuria: Secondary | ICD-10-CM | POA: Diagnosis present

## 2021-05-17 DIAGNOSIS — G9349 Other encephalopathy: Secondary | ICD-10-CM | POA: Diagnosis present

## 2021-05-17 DIAGNOSIS — I63011 Cerebral infarction due to thrombosis of right vertebral artery: Secondary | ICD-10-CM | POA: Diagnosis not present

## 2021-05-17 DIAGNOSIS — Z8249 Family history of ischemic heart disease and other diseases of the circulatory system: Secondary | ICD-10-CM | POA: Diagnosis not present

## 2021-05-17 DIAGNOSIS — Z20822 Contact with and (suspected) exposure to covid-19: Secondary | ICD-10-CM | POA: Diagnosis present

## 2021-05-17 DIAGNOSIS — E1165 Type 2 diabetes mellitus with hyperglycemia: Secondary | ICD-10-CM | POA: Diagnosis present

## 2021-05-17 DIAGNOSIS — R4701 Aphasia: Secondary | ICD-10-CM | POA: Diagnosis present

## 2021-05-17 DIAGNOSIS — G8194 Hemiplegia, unspecified affecting left nondominant side: Secondary | ICD-10-CM | POA: Diagnosis present

## 2021-05-17 DIAGNOSIS — G4733 Obstructive sleep apnea (adult) (pediatric): Secondary | ICD-10-CM | POA: Diagnosis present

## 2021-05-17 DIAGNOSIS — I63219 Cerebral infarction due to unspecified occlusion or stenosis of unspecified vertebral arteries: Secondary | ICD-10-CM | POA: Diagnosis not present

## 2021-05-17 DIAGNOSIS — R29898 Other symptoms and signs involving the musculoskeletal system: Secondary | ICD-10-CM | POA: Diagnosis not present

## 2021-05-17 DIAGNOSIS — I69354 Hemiplegia and hemiparesis following cerebral infarction affecting left non-dominant side: Secondary | ICD-10-CM | POA: Diagnosis not present

## 2021-05-17 DIAGNOSIS — N401 Enlarged prostate with lower urinary tract symptoms: Secondary | ICD-10-CM | POA: Diagnosis present

## 2021-05-17 DIAGNOSIS — R29705 NIHSS score 5: Secondary | ICD-10-CM | POA: Diagnosis present

## 2021-05-17 DIAGNOSIS — E785 Hyperlipidemia, unspecified: Secondary | ICD-10-CM | POA: Diagnosis not present

## 2021-05-17 DIAGNOSIS — I6389 Other cerebral infarction: Secondary | ICD-10-CM | POA: Diagnosis not present

## 2021-05-17 DIAGNOSIS — E669 Obesity, unspecified: Secondary | ICD-10-CM | POA: Diagnosis not present

## 2021-05-17 DIAGNOSIS — Z751 Person awaiting admission to adequate facility elsewhere: Secondary | ICD-10-CM | POA: Diagnosis not present

## 2021-05-17 DIAGNOSIS — I69952 Hemiplegia and hemiparesis following unspecified cerebrovascular disease affecting left dominant side: Secondary | ICD-10-CM | POA: Diagnosis not present

## 2021-05-17 DIAGNOSIS — N1832 Chronic kidney disease, stage 3b: Secondary | ICD-10-CM

## 2021-05-17 DIAGNOSIS — Z6841 Body Mass Index (BMI) 40.0 and over, adult: Secondary | ICD-10-CM | POA: Diagnosis not present

## 2021-05-17 DIAGNOSIS — I6522 Occlusion and stenosis of left carotid artery: Secondary | ICD-10-CM | POA: Diagnosis not present

## 2021-05-17 DIAGNOSIS — I651 Occlusion and stenosis of basilar artery: Secondary | ICD-10-CM | POA: Diagnosis present

## 2021-05-17 DIAGNOSIS — R4781 Slurred speech: Secondary | ICD-10-CM | POA: Diagnosis not present

## 2021-05-17 DIAGNOSIS — I69391 Dysphagia following cerebral infarction: Secondary | ICD-10-CM | POA: Diagnosis not present

## 2021-05-17 DIAGNOSIS — E7800 Pure hypercholesterolemia, unspecified: Secondary | ICD-10-CM | POA: Diagnosis present

## 2021-05-17 DIAGNOSIS — R2681 Unsteadiness on feet: Secondary | ICD-10-CM | POA: Diagnosis present

## 2021-05-17 DIAGNOSIS — I129 Hypertensive chronic kidney disease with stage 1 through stage 4 chronic kidney disease, or unspecified chronic kidney disease: Secondary | ICD-10-CM | POA: Diagnosis present

## 2021-05-17 DIAGNOSIS — Z23 Encounter for immunization: Secondary | ICD-10-CM | POA: Diagnosis present

## 2021-05-17 DIAGNOSIS — M109 Gout, unspecified: Secondary | ICD-10-CM | POA: Diagnosis not present

## 2021-05-17 DIAGNOSIS — E1169 Type 2 diabetes mellitus with other specified complication: Secondary | ICD-10-CM | POA: Diagnosis not present

## 2021-05-17 DIAGNOSIS — R1313 Dysphagia, pharyngeal phase: Secondary | ICD-10-CM | POA: Diagnosis present

## 2021-05-17 DIAGNOSIS — E1122 Type 2 diabetes mellitus with diabetic chronic kidney disease: Secondary | ICD-10-CM | POA: Diagnosis present

## 2021-05-17 LAB — COMPREHENSIVE METABOLIC PANEL
ALT: 15 U/L (ref 0–44)
AST: 17 U/L (ref 15–41)
Albumin: 3.3 g/dL — ABNORMAL LOW (ref 3.5–5.0)
Alkaline Phosphatase: 51 U/L (ref 38–126)
Anion gap: 6 (ref 5–15)
BUN: 25 mg/dL — ABNORMAL HIGH (ref 6–20)
CO2: 28 mmol/L (ref 22–32)
Calcium: 8.3 mg/dL — ABNORMAL LOW (ref 8.9–10.3)
Chloride: 106 mmol/L (ref 98–111)
Creatinine, Ser: 1.8 mg/dL — ABNORMAL HIGH (ref 0.61–1.24)
GFR, Estimated: 44 mL/min — ABNORMAL LOW (ref >60)
Glucose, Bld: 138 mg/dL — ABNORMAL HIGH (ref 70–99)
Potassium: 3.3 mmol/L — ABNORMAL LOW (ref 3.5–5.1)
Sodium: 140 mmol/L (ref 135–145)
Total Bilirubin: 0.6 mg/dL (ref 0.3–1.2)
Total Protein: 7.1 g/dL (ref 6.5–8.1)

## 2021-05-17 LAB — CBC
HCT: 40.6 % (ref 39.0–52.0)
Hemoglobin: 12.1 g/dL — ABNORMAL LOW (ref 13.0–17.0)
MCH: 23.9 pg — ABNORMAL LOW (ref 26.0–34.0)
MCHC: 29.8 g/dL — ABNORMAL LOW (ref 30.0–36.0)
MCV: 80.1 fL (ref 80.0–100.0)
Platelets: 361 10*3/uL (ref 150–400)
RBC: 5.07 MIL/uL (ref 4.22–5.81)
RDW: 15 % (ref 11.5–15.5)
WBC: 7.5 10*3/uL (ref 4.0–10.5)
nRBC: 0 % (ref 0.0–0.2)

## 2021-05-17 LAB — CBG MONITORING, ED
Glucose-Capillary: 141 mg/dL — ABNORMAL HIGH (ref 70–99)
Glucose-Capillary: 121 mg/dL — ABNORMAL HIGH (ref 70–99)
Glucose-Capillary: 125 mg/dL — ABNORMAL HIGH (ref 70–99)

## 2021-05-17 LAB — GLUCOSE, CAPILLARY
Glucose-Capillary: 108 mg/dL — ABNORMAL HIGH (ref 70–99)
Glucose-Capillary: 118 mg/dL — ABNORMAL HIGH (ref 70–99)

## 2021-05-17 LAB — TSH: TSH: 1.208 u[IU]/mL (ref 0.350–4.500)

## 2021-05-17 LAB — HIV ANTIBODY (ROUTINE TESTING W REFLEX): HIV Screen 4th Generation wRfx: NONREACTIVE

## 2021-05-17 LAB — HEMOGLOBIN A1C
Hgb A1c MFr Bld: 6.6 % — ABNORMAL HIGH (ref 4.8–5.6)
Mean Plasma Glucose: 142.72 mg/dL

## 2021-05-17 LAB — LIPID PANEL
Cholesterol: 191 mg/dL (ref 0–200)
HDL: 40 mg/dL — ABNORMAL LOW (ref >40)
LDL Cholesterol: 139 mg/dL — ABNORMAL HIGH (ref 0–99)
Total CHOL/HDL Ratio: 4.8 RATIO
Triglycerides: 61 mg/dL (ref <150)
VLDL: 12 mg/dL (ref 0–40)

## 2021-05-17 IMAGING — MR MR MRA NECK WO/W CM
4 series · 35 of 48 positions shown · IV contrast (10 ml Gadavist)
Comparison: CT head without contrast [DATE]

CLINICAL DATA: Neuro deficit, acute, stroke suspected. Weakness and
slurred speech. New onset of symptoms yesterday. New onset of
dysarthria. Chronic left-sided weakness.

EXAM:
MRI HEAD WITHOUT CONTRAST
MRA HEAD WITHOUT CONTRAST
MRA NECK WITHOUT AND WITH CONTRAST
TECHNIQUE: Multiplanar, multiecho pulse sequences of the brain and surrounding
structures were obtained without intravenous contrast. Angiographic
images of the Circle of Willis were obtained using MRA technique
without intravenous contrast. Angiographic images of the neck were
obtained using MRA technique without and with intravenous contrast.
Carotid stenosis measurements (when applicable) are obtained
utilizing NASCET criteria, using the distal internal carotid
diameter as the denominator.
CONTRAST:  10mL GADAVIST GADOBUTROL 1 MMOL/ML IV SOLN

[Series 7: tof_fl3d_tra_iso · axial · 0.6mm · 0.57mm/px · z∈[-152,-70]mm · 16 of 146 slices shown]
[im 1/146]
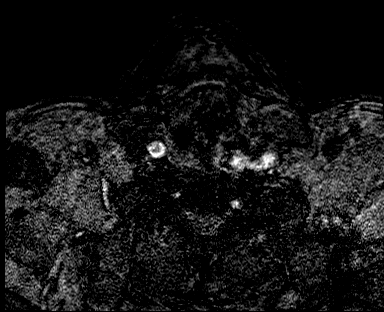
[im 7/146]
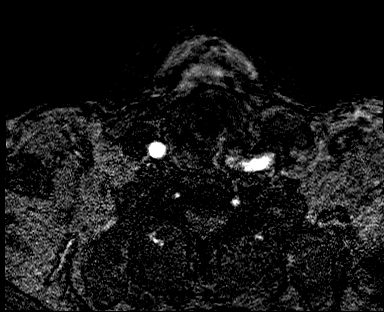
[im 14/146]
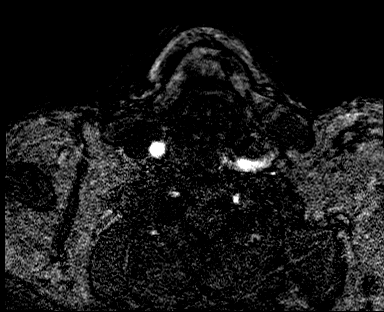
[im 21/146]
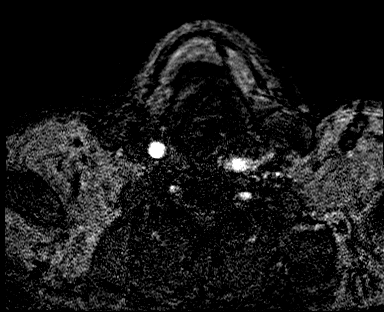
[im 28/146]
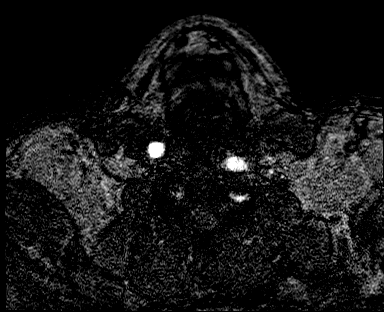
[im 35/146]
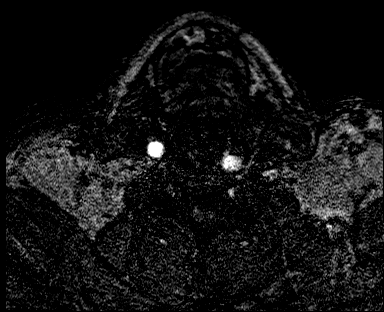
[im 42/146]
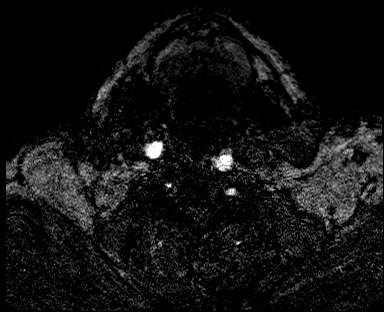
[im 49/146]
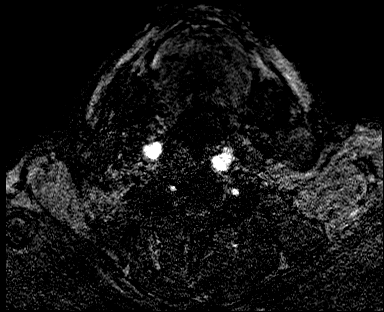
[im 56/146]
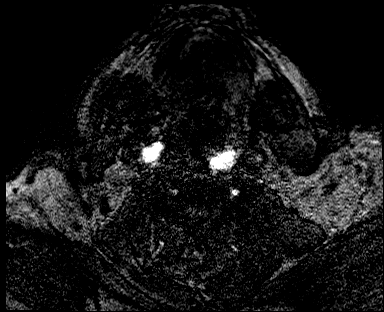
[im 63/146]
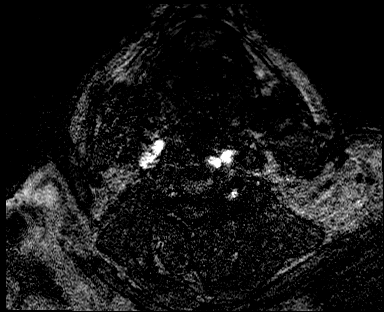
[im 76/146]
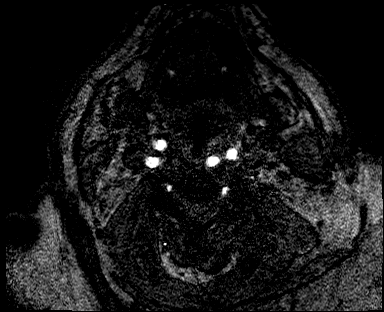
[im 83/146]
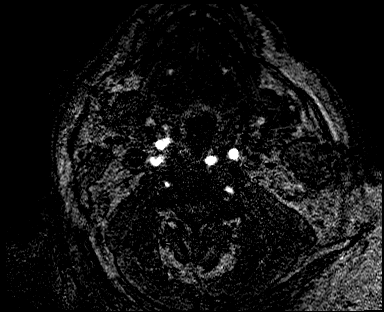
[im 104/146]
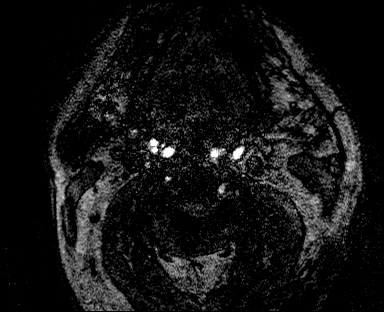
[im 118/146]
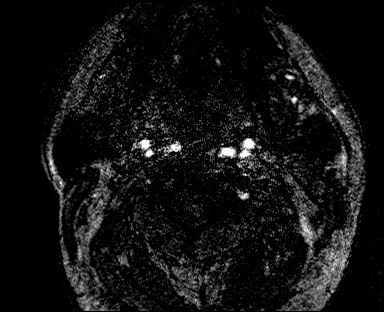
[im 125/146]
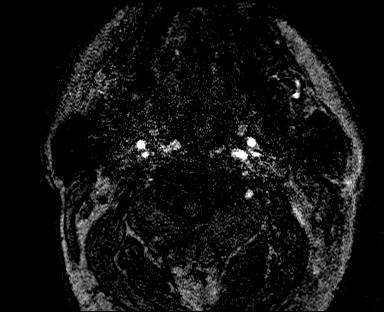
[im 139/146]
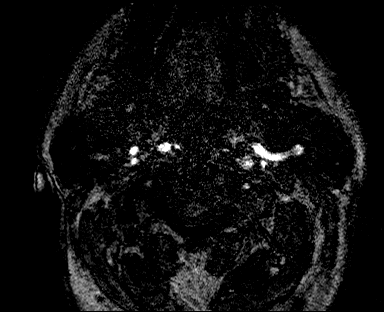

[Series 13: angio_fl3d_cor_post_ttc=3.0s_moco-adv · coronal · 0.9mm · 0.97mm/px · 9 of 80 slices shown]
[im 1/80]
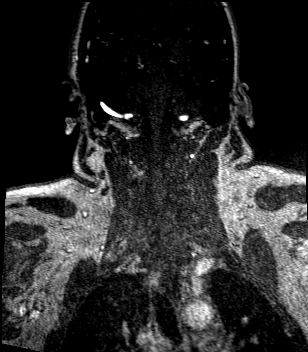
[im 14/80]
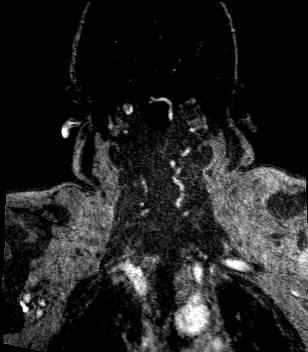
[im 27/80]
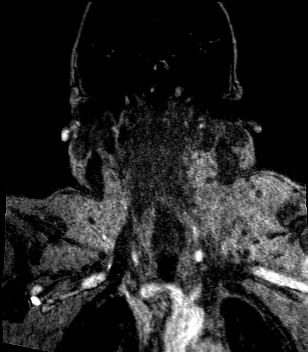
[im 33/80]
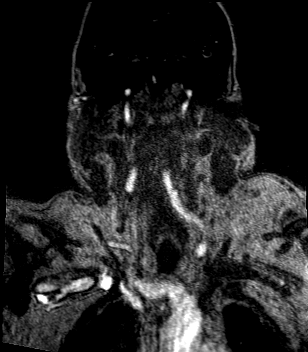
[im 40/80]
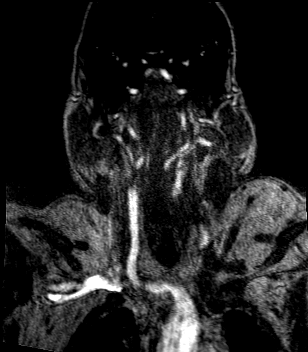
[im 47/80]
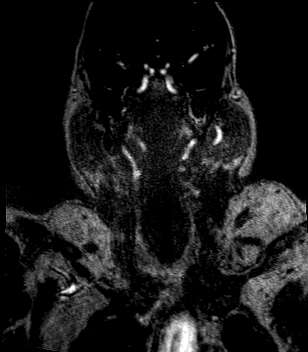
[im 53/80]
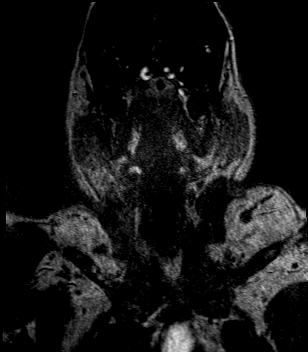
[im 66/80]
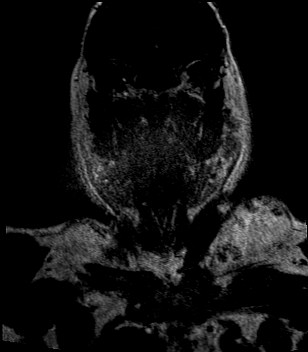
[im 80/80]
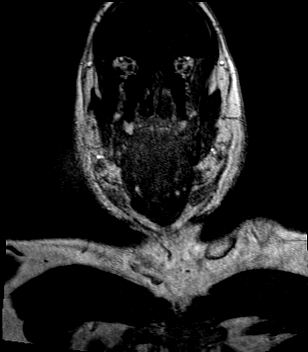

[Series 14: angio_fl3d_cor_post_ttc=3.0s_moco-adv_sub · coronal · 0.9mm · 0.97mm/px · 9 of 78 slices shown]
[im 1/78]
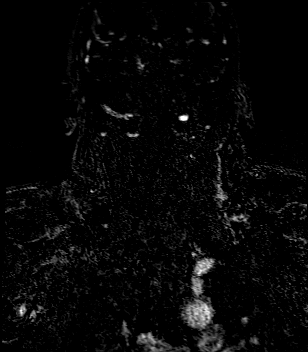
[im 15/78]
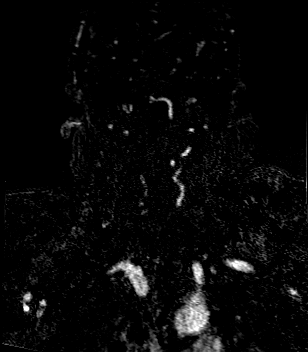
[im 22/78]
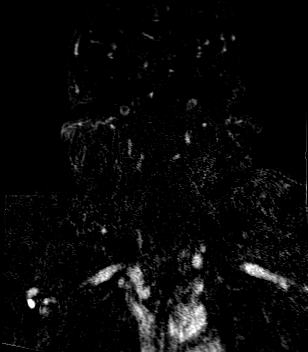
[im 36/78]
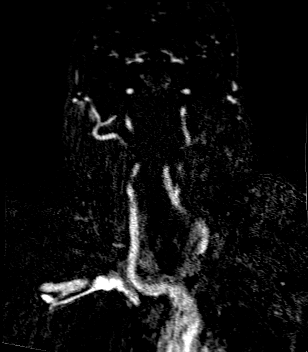
[im 43/78]
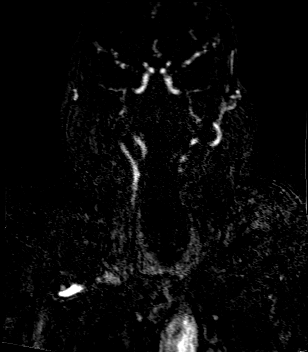
[im 57/78]
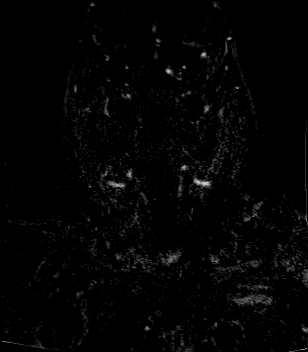
[im 64/78]
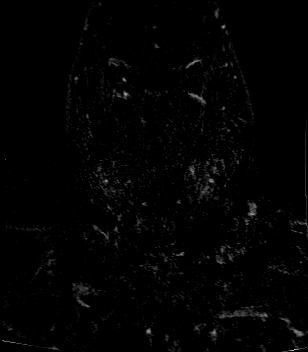
[im 71/78]
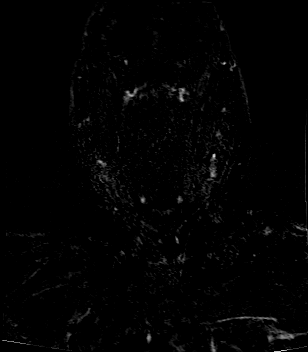
[im 78/78]
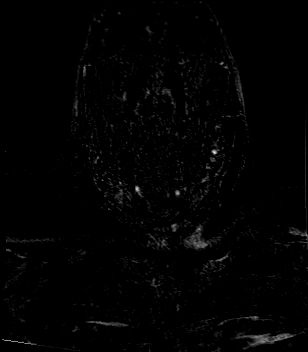

[Series 1003: tumble · axial · 0.6mm · 0.34mm/px · 1 of 1 slices shown]
[im 1/1]
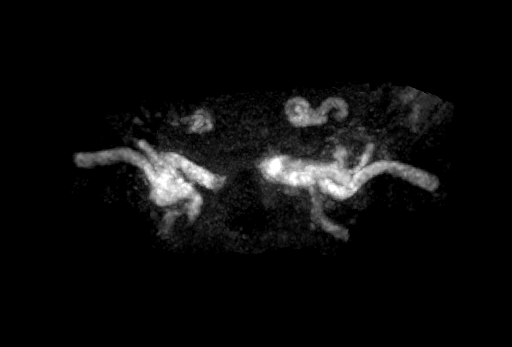

[35 of 48 positions shown; findings below may reference images not displayed]

FINDINGS: MRI HEAD FINDINGS

Brain: Acute nonhemorrhagic 12 mm infarct is present in the
posterior limb of the left internal capsule or lateral thalamus.

Remote nonhemorrhagic lacunar infarcts are present in the thalami
bilaterally. Multiple remote lacunar infarcts are present in the
corona radiata bilaterally. Multiple remote lacunar infarcts are
present within the right paramedian brainstem and cerebellum
bilaterally. A larger area of encephalomalacia is consistent with a
remote left superior cerebellar infarct.

Multiple foci of susceptibility are present in the posterior and
medial thalami bilaterally. Additional subcortical susceptibility
foci are present in the operculum bilaterally, left greater than
right and scattered throughout the subcortical frontal lobes.

No acute hemorrhage is present. No mass lesion is present. The
ventricles are of normal size. No significant extraaxial fluid
collection is present. The internal auditory canals are within
normal limits.

Vascular: Flow is present in the major intracranial arteries.

Skull and upper cervical spine: The craniocervical junction is
normal. Upper cervical spine is within normal limits. Marrow signal
is unremarkable.

Sinuses/Orbits: The globes and orbits are within normal limits.
Extensive left mastoid effusion is present. Small right mastoid
effusion is present. No obstructing nasopharyngeal lesion is
present. The paranasal sinuses are otherwise clear.

MRA HEAD FINDINGS

Internal carotid arteries are within normal limits from the high
cervical segments through the ICA termini. Artifactual signal loss
is present at the skull base on the right. Mild narrowing is present
at the proximal left A1 segment. No significant A1 or M1 segment
stenosis is present. MCA bifurcations are intact. Signal loss is
present the proximal MCA branch vessels bilaterally. Given more rib
os distal MCA branch vessel signal, this is likely in part
artifactual. There is some asymmetric perfusion to left MCA branch
vessels.

The left vertebral artery is the dominant vessel. Moderate narrowing
is present in the distal right V4 segment. There is some
irregularity of the basilar artery without a focal basilar artery
stenosis. Prominent posterior cerebral arteries are present
bilaterally, right greater than left. Moderate to high-grade
proximal P1 segment stenoses are present bilaterally. The right
posterior cerebral artery is not visualized beyond the proximal P2
segment. A high-grade left P2 segment stenosis is present with poor
signal distally.

MRA NECK FINDINGS

Time-of-flight images demonstrate no significant flow disturbance to
either carotid artery bifurcation. Flow is antegrade in the
vertebral arteries bilaterally.

Postcontrast images demonstrate no significant stenosis at the
aortic arch. Common origin of the left common carotid artery and not
a min artery is present. The right common carotid artery is within
normal limits. Bifurcation is unremarkable for only mild
atherosclerotic disease. No significant stenosis. Cervical right ICA
is normal.

The left common carotid artery is mildly tortuous. Mild narrowing of
less than 50% is suggested in the mid left common carotid artery.
Minimal atherosclerotic changes present at the left carotid
bifurcation without a significant stenosis. The left ICA is within
normal limits.

The left vertebral artery is dominant. Distal right V4 segment
stenosis confirmed. Moderate distal basilar artery stenosis is
likely present. High-grade P1 stenoses are confirmed.
IMPRESSION: 1. Acute nonhemorrhagic 12 mm infarct in the posterior limb of the
left internal capsule or lateral thalamus.
2. Remote nonhemorrhagic lacunar infarcts of the thalami bilaterally
with additional remote lacunar infarcts of the corona radiata
bilaterally. This is likely the sequelae of the high-grade proximal
P1 segment stenoses with impact on the meticulous straight arteries.
3. Multiple remote lacunar infarcts of the right paramedian
brainstem and cerebellum bilaterally. Findings are consistent with
the basilar artery stenosis.
4. Asymmetric attenuation to left MCA branch vessels suggesting
either proximal MCA narrowing or overall decreased perfusion.
5. Moderate to high-grade proximal P1 segment stenoses bilaterally.
6. Moderate distal basilar artery stenosis.
7. Less than 50% stenosis of the mid left common carotid artery.

These results were called by telephone at the time of interpretation
on [DATE] at [DATE] to provider ACHEMANN, who verbally
acknowledged these results.

## 2021-05-17 IMAGING — MR MR HEAD W/O CM
9 of 10 series · 40 of 48 positions shown · IV contrast (gadavist)
Comparison: CT head without contrast [DATE]

CLINICAL DATA: Neuro deficit, acute, stroke suspected. Weakness and
slurred speech. New onset of symptoms yesterday. New onset of
dysarthria. Chronic left-sided weakness.

EXAM:
MRI HEAD WITHOUT CONTRAST
MRA HEAD WITHOUT CONTRAST
MRA NECK WITHOUT AND WITH CONTRAST
TECHNIQUE: Multiplanar, multiecho pulse sequences of the brain and surrounding
structures were obtained without intravenous contrast. Angiographic
images of the Circle of Willis were obtained using MRA technique
without intravenous contrast. Angiographic images of the neck were
obtained using MRA technique without and with intravenous contrast.
Carotid stenosis measurements (when applicable) are obtained
utilizing NASCET criteria, using the distal internal carotid
diameter as the denominator.
CONTRAST:  10mL GADAVIST GADOBUTROL 1 MMOL/ML IV SOLN

[Series 5: DWI · axial · 4.0mm · 0.96mm/px · z∈[-41,+116]mm · 6 of 42 slices shown (1 of 4)]
[im 1/42]
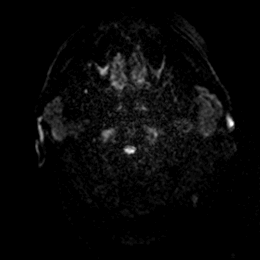
[im 9/42]
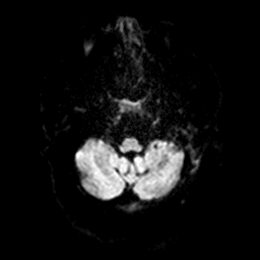
[im 17/42]
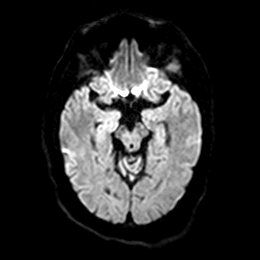
[im 25/42]
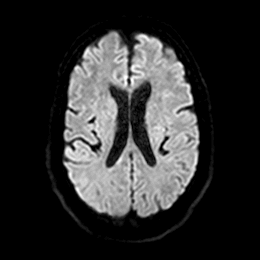
[im 33/42]
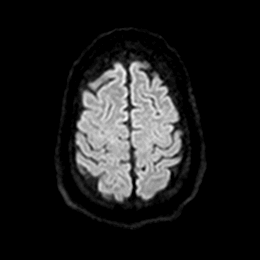
[im 42/42]
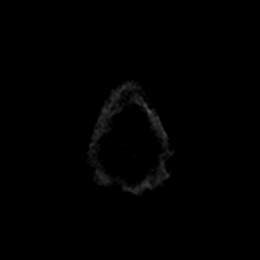

[Series 6: DWI · axial · 4.0mm · 0.96mm/px · z∈[-41,+116]mm · 5 of 42 slices shown (2 of 4)]
[im 1/42]
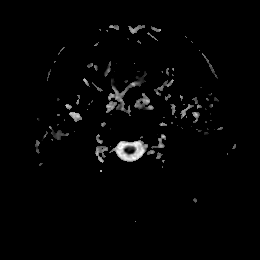
[im 11/42]
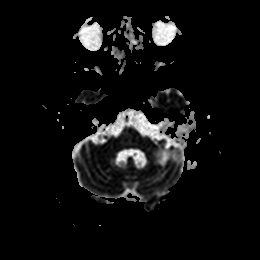
[im 21/42]
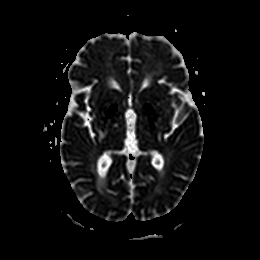
[im 31/42]
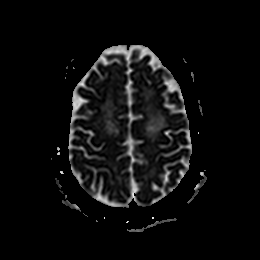
[im 42/42]
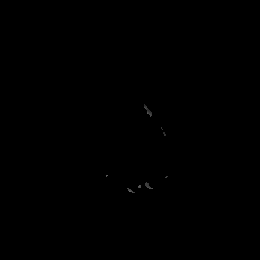

[Series 7: DWI · coronal · 4.0mm · 0.88mm/px · 5 of 40 slices shown (3 of 4)]
[im 1/40]
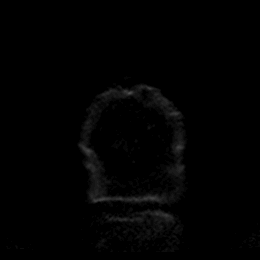
[im 10/40]
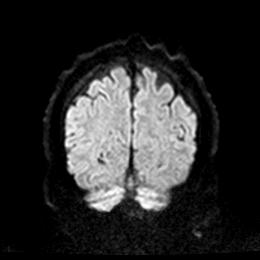
[im 20/40]
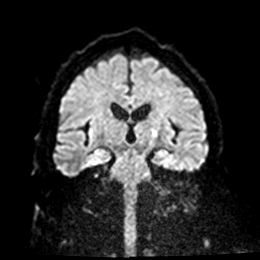
[im 30/40]
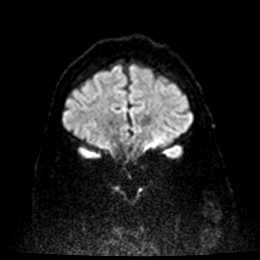
[im 40/40]
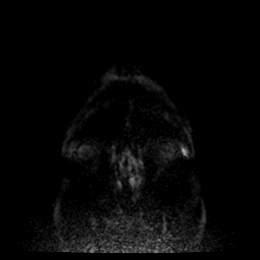

[Series 8: DWI · coronal · 4.0mm · 0.88mm/px · 5 of 40 slices shown (4 of 4)]
[im 1/40]
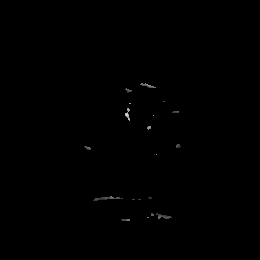
[im 10/40]
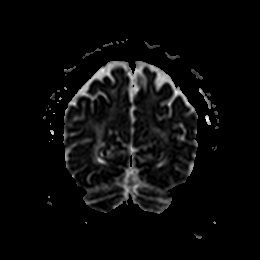
[im 20/40]
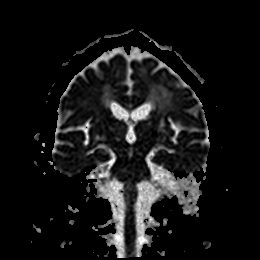
[im 30/40]
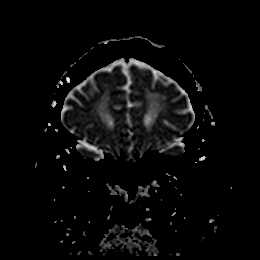
[im 40/40]
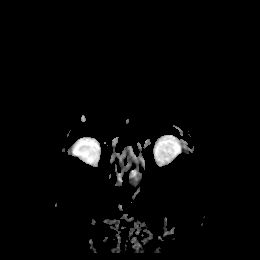

[Series 9: T1 · sagittal · 5.0mm · 0.90mm/px · 3 of 23 slices shown]
[im 1/23]
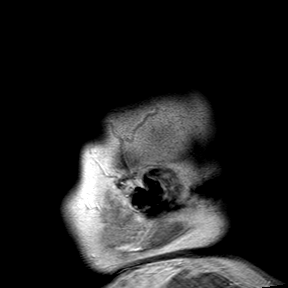
[im 12/23]
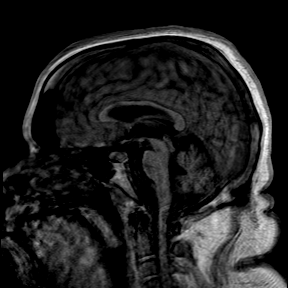
[im 23/23]
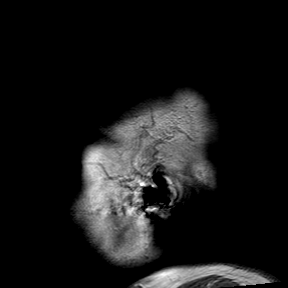

[Series 10: T2 · axial · 5.0mm · 0.78mm/px · z∈[-43,+118]mm · 3 of 25 slices shown (1 of 2)]
[im 1/25]
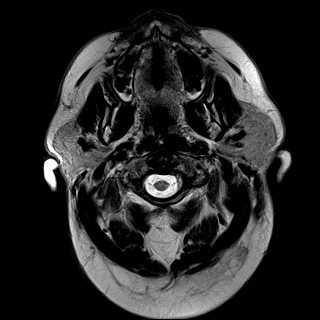
[im 13/25]
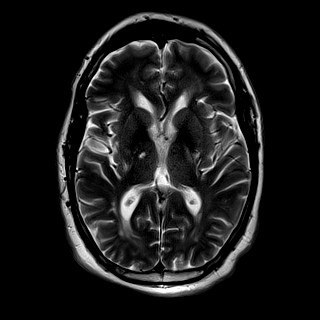
[im 25/25]
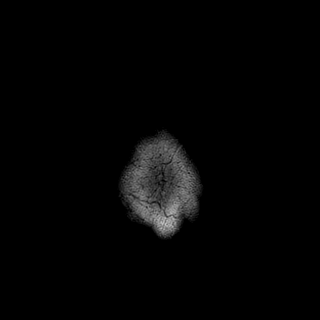

[Series 11: ax hemo · axial · 5.0mm · 0.98mm/px · z∈[-45,+116]mm · 4 of 29 slices shown]
[im 1/29]
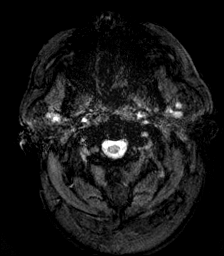
[im 10/29]
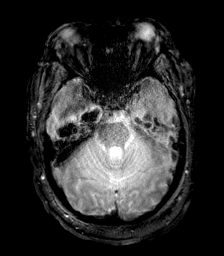
[im 19/29]
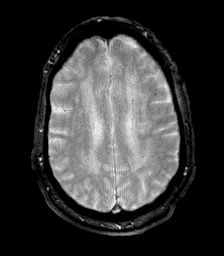
[im 29/29]
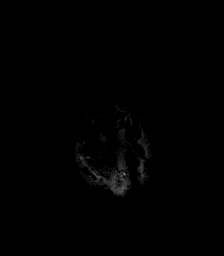

[Series 12: FLAIR · axial · 4.0mm · 0.49mm/px · z∈[-45,+116]mm · 5 of 36 slices shown]
[im 1/36]
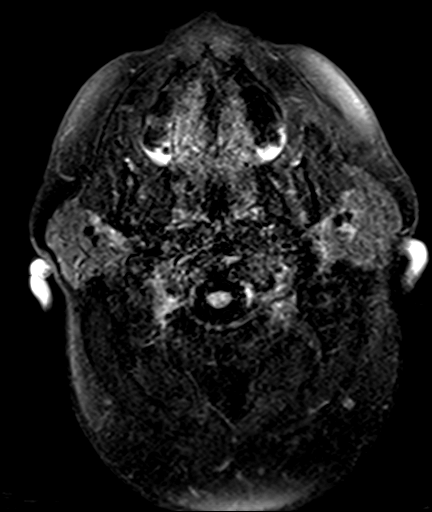
[im 9/36]
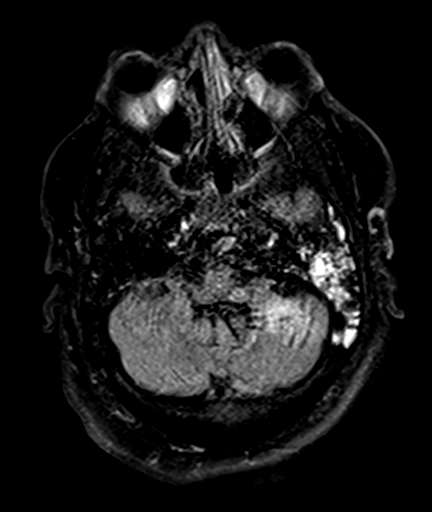
[im 18/36]
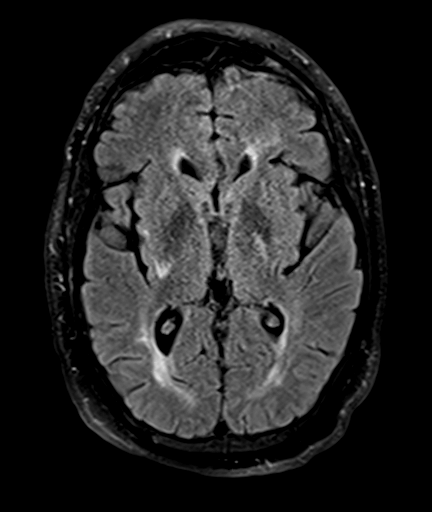
[im 27/36]
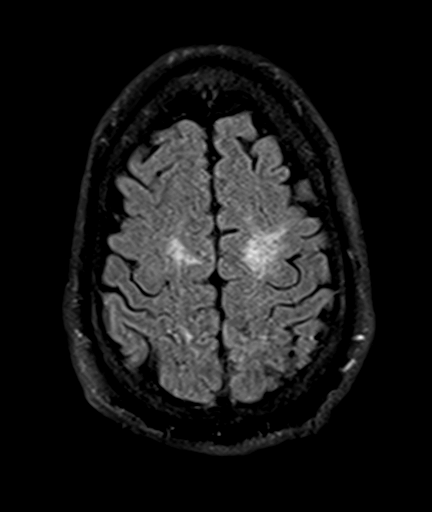
[im 36/36]
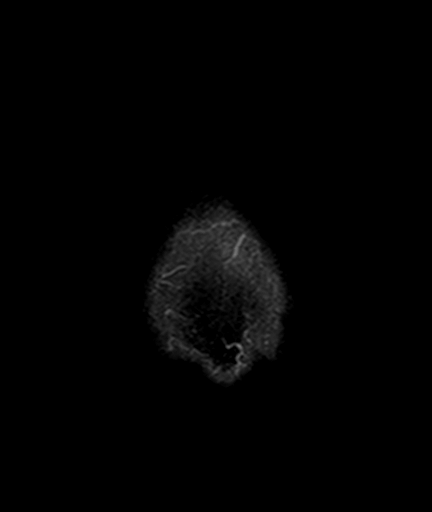

[Series 14: T2 · coronal · 5.0mm · 0.72mm/px · 4 of 32 slices shown (2 of 2)]
[im 1/32]
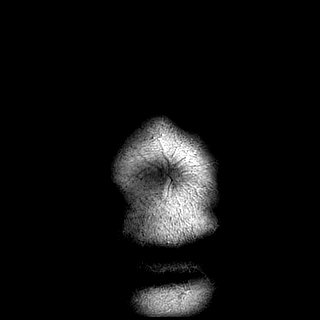
[im 11/32]
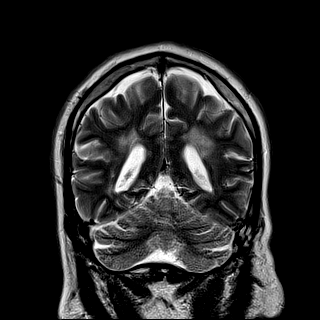
[im 21/32]
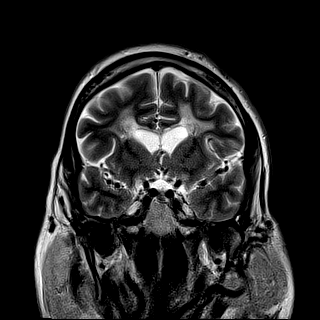
[im 32/32]
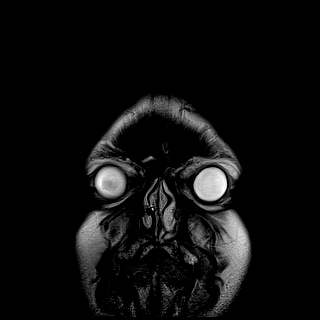

[40 of 48 positions shown; findings below may reference images not displayed]

FINDINGS: MRI HEAD FINDINGS

Brain: Acute nonhemorrhagic 12 mm infarct is present in the
posterior limb of the left internal capsule or lateral thalamus.

Remote nonhemorrhagic lacunar infarcts are present in the thalami
bilaterally. Multiple remote lacunar infarcts are present in the
corona radiata bilaterally. Multiple remote lacunar infarcts are
present within the right paramedian brainstem and cerebellum
bilaterally. A larger area of encephalomalacia is consistent with a
remote left superior cerebellar infarct.

Multiple foci of susceptibility are present in the posterior and
medial thalami bilaterally. Additional subcortical susceptibility
foci are present in the operculum bilaterally, left greater than
right and scattered throughout the subcortical frontal lobes.

No acute hemorrhage is present. No mass lesion is present. The
ventricles are of normal size. No significant extraaxial fluid
collection is present. The internal auditory canals are within
normal limits.

Vascular: Flow is present in the major intracranial arteries.

Skull and upper cervical spine: The craniocervical junction is
normal. Upper cervical spine is within normal limits. Marrow signal
is unremarkable.

Sinuses/Orbits: The globes and orbits are within normal limits.
Extensive left mastoid effusion is present. Small right mastoid
effusion is present. No obstructing nasopharyngeal lesion is
present. The paranasal sinuses are otherwise clear.

MRA HEAD FINDINGS

Internal carotid arteries are within normal limits from the high
cervical segments through the ICA termini. Artifactual signal loss
is present at the skull base on the right. Mild narrowing is present
at the proximal left A1 segment. No significant A1 or M1 segment
stenosis is present. MCA bifurcations are intact. Signal loss is
present the proximal MCA branch vessels bilaterally. Given more rib
os distal MCA branch vessel signal, this is likely in part
artifactual. There is some asymmetric perfusion to left MCA branch
vessels.

The left vertebral artery is the dominant vessel. Moderate narrowing
is present in the distal right V4 segment. There is some
irregularity of the basilar artery without a focal basilar artery
stenosis. Prominent posterior cerebral arteries are present
bilaterally, right greater than left. Moderate to high-grade
proximal P1 segment stenoses are present bilaterally. The right
posterior cerebral artery is not visualized beyond the proximal P2
segment. A high-grade left P2 segment stenosis is present with poor
signal distally.

MRA NECK FINDINGS

Time-of-flight images demonstrate no significant flow disturbance to
either carotid artery bifurcation. Flow is antegrade in the
vertebral arteries bilaterally.

Postcontrast images demonstrate no significant stenosis at the
aortic arch. Common origin of the left common carotid artery and not
a min artery is present. The right common carotid artery is within
normal limits. Bifurcation is unremarkable for only mild
atherosclerotic disease. No significant stenosis. Cervical right ICA
is normal.

The left common carotid artery is mildly tortuous. Mild narrowing of
less than 50% is suggested in the mid left common carotid artery.
Minimal atherosclerotic changes present at the left carotid
bifurcation without a significant stenosis. The left ICA is within
normal limits.

The left vertebral artery is dominant. Distal right V4 segment
stenosis confirmed. Moderate distal basilar artery stenosis is
likely present. High-grade P1 stenoses are confirmed.
IMPRESSION: 1. Acute nonhemorrhagic 12 mm infarct in the posterior limb of the
left internal capsule or lateral thalamus.
2. Remote nonhemorrhagic lacunar infarcts of the thalami bilaterally
with additional remote lacunar infarcts of the corona radiata
bilaterally. This is likely the sequelae of the high-grade proximal
P1 segment stenoses with impact on the meticulous straight arteries.
3. Multiple remote lacunar infarcts of the right paramedian
brainstem and cerebellum bilaterally. Findings are consistent with
the basilar artery stenosis.
4. Asymmetric attenuation to left MCA branch vessels suggesting
either proximal MCA narrowing or overall decreased perfusion.
5. Moderate to high-grade proximal P1 segment stenoses bilaterally.
6. Moderate distal basilar artery stenosis.
7. Less than 50% stenosis of the mid left common carotid artery.

These results were called by telephone at the time of interpretation
on [DATE] at [DATE] to provider ACHEMANN, who verbally
acknowledged these results.

## 2021-05-17 MED ORDER — GADOBUTROL 1 MMOL/ML IV SOLN
10.0000 mL | Freq: Once | INTRAVENOUS | Status: AC | PRN
  Administered 2021-05-17: 10 mL via INTRAVENOUS

## 2021-05-17 MED ORDER — INFLUENZA VAC SPLIT QUAD 0.5 ML IM SUSY: 0.5000 mL | PREFILLED_SYRINGE | INTRAMUSCULAR | Status: DC

## 2021-05-17 MED ORDER — LABETALOL HCL 5 MG/ML IV SOLN
10.0000 mg | INTRAVENOUS | Status: DC | PRN
  Administered 2021-05-17 – 2021-05-18 (×2): 10 mg via INTRAVENOUS
  Filled 2021-05-17: qty 4

## 2021-05-17 MED ORDER — MAGNESIUM SULFATE 2 GM/50ML IV SOLN
2.0000 g | Freq: Once | INTRAVENOUS | Status: AC
  Administered 2021-05-17: 2 g via INTRAVENOUS
  Filled 2021-05-17: qty 50

## 2021-05-17 NOTE — Plan of Care (Signed)
  Problem: Acute Rehab OT Goals (only OT should resolve) Goal: Pt. Will Perform Grooming Flowsheets (Taken 05/17/2021 1037) Pt Will Perform Grooming:  with modified independence  standing Goal: Pt. Will Perform Lower Body Dressing Flowsheets (Taken 05/17/2021 1037) Pt Will Perform Lower Body Dressing:  with modified independence  sitting/lateral leans  sit to/from stand Goal: Pt. Will Transfer To Toilet Flowsheets (Taken 05/17/2021 1037) Pt Will Transfer to Toilet:  with modified independence  ambulating Goal: Pt/Caregiver Will Perform Home Exercise Program Flowsheets (Taken 05/17/2021 1037) Pt/caregiver will Perform Home Exercise Program:  Increased ROM  Increased strength  Left upper extremity  Independently  Kelsea Mousel OT, MOT

## 2021-05-17 NOTE — Plan of Care (Signed)
  Problem: Acute Rehab PT Goals(only PT should resolve) Goal: Pt Will Go Supine/Side To Sit Outcome: Progressing Flowsheets (Taken 05/17/2021 1220) Pt will go Supine/Side to Sit:  Independently  with modified independence Goal: Patient Will Transfer Sit To/From Stand Outcome: Progressing Flowsheets (Taken 05/17/2021 1220) Patient will transfer sit to/from stand:  with modified independence  with supervision Goal: Pt Will Transfer Bed To Chair/Chair To Bed Outcome: Progressing Flowsheets (Taken 05/17/2021 1220) Pt will Transfer Bed to Chair/Chair to Bed:  with modified independence  with supervision Goal: Pt Will Ambulate Outcome: Progressing Flowsheets (Taken 05/17/2021 1220) Pt will Ambulate:  > 125 feet  with modified independence  with supervision  with cane   12:21 PM, 05/17/21 Ocie Bob, MPT Physical Therapist with [facility] [phone] office [phone] mobile phone

## 2021-05-17 NOTE — Evaluation (Signed)
Speech Language Pathology Evaluation Patient Details Name: Brandon French MRN: [id number] DOB: June 11, 1966 Today's Date: 05/17/2021 Time: 2979-8921 SLP Time Calculation (min) (ACUTE ONLY): 20 min  Problem List:  Patient Active Problem List   Diagnosis Date Noted   Acute CVA (cerebrovascular accident) (HCC) 05/17/2021   Stroke (HCC) 05/16/2021   DMII (diabetes mellitus, type 2) (HCC) 05/16/2021   HTN (hypertension) 05/16/2021   HLD (hyperlipidemia) 05/16/2021   Past Medical History: History reviewed. No pertinent past medical history. Past Surgical History: History reviewed. No pertinent surgical history. HPI:  55 year old male with history of diabetes mellitus type 2, hypertension, hyperlipidemia, unspecified stroke with possible residual left-sided weakness presented with weakness and slurred speech.  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  He failed swallow screen and NG tube was placed. MRI shows Acute nonhemorrhagic 12 mm infarct in the posterior limb of the left internal capsule or lateral thalamus. BSE and SLE requested.   Assessment / Plan / Recommendation Clinical Impression  Pt presents with moderate to severe dysarthria characterized by imprecise articulation, monopitch, and reduced breath support. Language skills appear WNL and high level cognitive skills will need to be further assessed once speech intelligibility improves. Speech intelligibility is reduced and judged to be ~60% intelligible for short phrases in known context. Pt supplemented verbal speech with typing on his phone and providing first letter of each word for listener to then try to ascertain the word. He responded well with cues to give one word responses with increased vocal intensity. Pt will benefit from ongoing dysphagia and dysarthria therapy in acute setting and next level of care. SLP will follow.     SLP Assessment  SLP Recommendation/Assessment:  Patient needs continued Speech Lanaguage Pathology Services SLP Visit Diagnosis: Dysarthria and anarthria (R47.1)    Recommendations for follow up therapy are one component of a multi-disciplinary discharge planning process, led by the attending physician.  Recommendations may be updated based on patient status, additional functional criteria and insurance authorization.    Follow Up Recommendations  Outpatient SLP;Home health SLP    Frequency and Duration min 2x/week  1 week      SLP Evaluation Cognition  Overall Cognitive Status: Within Functional Limits for tasks assessed Arousal/Alertness: Awake/alert Orientation Level: Oriented X4 Year: 2022 Month: October Day of Week: Correct Memory: Appears intact Awareness: Appears intact Problem Solving: Appears intact Safety/Judgment: Appears intact       Comprehension  Auditory Comprehension Overall Auditory Comprehension: Appears within functional limits for tasks assessed Yes/No Questions: Within Functional Limits Commands: Within Functional Limits Conversation: Simple Visual Recognition/Discrimination Discrimination: Within Function Limits Reading Comprehension Reading Status: Not tested    Expression Expression Primary Mode of Expression: Verbal Verbal Expression Overall Verbal Expression: Impaired Initiation: No impairment Automatic Speech: Name;Social Response;Day of week Level of Generative/Spontaneous Verbalization: Sentence Repetition: Impaired Naming: No impairment Pragmatics: No impairment Interfering Components: Speech intelligibility Non-Verbal Means of Communication: Not applicable Written Expression Dominant Hand: Right Written Expression: Not tested   Oral / Motor  Oral Motor/Sensory Function Overall Oral Motor/Sensory Function: Moderate impairment Facial ROM: Within Functional Limits Facial Symmetry: Within Functional Limits Facial Strength: Within Functional Limits Lingual ROM: Reduced  right;Reduced left Lingual Symmetry: Within Functional Limits Lingual Strength: Reduced;Suspected CN XII (hypoglossal) dysfunction Lingual Sensation: Within Functional Limits Velum:  (impaired) Mandible: Within Functional Limits Motor Speech Overall Motor Speech: Impaired Respiration: Impaired Level of Impairment: Phrase Phonation: Low vocal intensity Resonance:  (needs further assessment, has NG) Articulation: Impaired  Level of Impairment: Word Intelligibility: Intelligibility reduced Word: 25-49% accurate Phrase: 25-49% accurate Sentence: 25-49% accurate Motor Planning: Witnin functional limits Motor Speech Errors: Aware Effective Techniques: Increased vocal intensity;Over-articulate;Slow rate;Pause   Thank you,  Havery Moros, CCC-SLP [phone]                     Brandon French 05/17/2021, 6:08 PM

## 2021-05-17 NOTE — Evaluation (Signed)
Clinical/Bedside Swallow Evaluation Patient Details  Name: RHYDIAN BALDI MRN: [id number] Date of Birth: Oct 29, 1965  Today's Date: 05/17/2021 Time: SLP Start Time (ACUTE ONLY): 1320 SLP Stop Time (ACUTE ONLY): 1342 SLP Time Calculation (min) (ACUTE ONLY): 22 min  Past Medical History: History reviewed. No pertinent past medical history. Past Surgical History: History reviewed. No pertinent surgical history. HPI:  55 year old male with history of diabetes mellitus type 2, hypertension, hyperlipidemia, unspecified stroke with possible residual left-sided weakness presented with weakness and slurred speech.  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  He failed swallow screen and NG tube was placed. MRI shows Acute nonhemorrhagic 12 mm infarct in the posterior limb of the left internal capsule or lateral thalamus. BSE and SLE requested.    Assessment / Plan / Recommendation  Clinical Impression  Pt presents with functionally severe dysphagia at this time with inability to handle oral secretions (drooling and oral pooling), impaired lingual movement/weakness, and suspected pharyngeal phase dysphagia with delayed coughing after limited trials. Pt appears to have impaired velar movement, however he also has NG in place. Pt presenting with mod/severe dysarthria and verbalizes that he does not want additional po trials. Recommend NPO and consider alternative means of nutrition for now and SLP to check back again tomorrow to see if improvement (MBSS not indicated at this moment due to severity of deficits). Continue oral care, ok for single ice chips after oral care, and keep suction at bedside. Pt in agreement with plan of care. SLP Visit Diagnosis: Dysphagia, unspecified (R13.10)    Aspiration Risk  Severe aspiration risk;Risk for inadequate nutrition/hydration    Diet Recommendation NPO;Alternative means - temporary   Medication Administration: Via  alternative means    Other  Recommendations Oral Care Recommendations: Oral care QID;Oral care prior to ice chip/H20;Staff/trained caregiver to provide oral care    Recommendations for follow up therapy are one component of a multi-disciplinary discharge planning process, led by the attending physician.  Recommendations may be updated based on patient status, additional functional criteria and insurance authorization.  Follow up Recommendations Outpatient SLP;Home health SLP      Frequency and Duration min 2x/week  1 week       Prognosis Prognosis for Safe Diet Advancement: Fair Barriers to Reach Goals: Severity of deficits      Swallow Study   General Date of Onset: 05/16/21 HPI: 55 year old male with history of diabetes mellitus type 2, hypertension, hyperlipidemia, unspecified stroke with possible residual left-sided weakness presented with weakness and slurred speech.  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  He failed swallow screen and NG tube was placed. MRI shows Acute nonhemorrhagic 12 mm infarct in the posterior limb of the left internal capsule or lateral thalamus. BSE and SLE requested. Type of Study: Bedside Swallow Evaluation Previous Swallow Assessment: N/A Diet Prior to this Study: NPO Temperature Spikes Noted: No Respiratory Status: Room air History of Recent Intubation: No Behavior/Cognition: Alert;Cooperative;Pleasant mood Oral Cavity Assessment: Excessive secretions Oral Care Completed by SLP: Yes Oral Cavity - Dentition: Adequate natural dentition Vision: Functional for self-feeding Self-Feeding Abilities: Able to feed self Patient Positioning: Upright in bed Baseline Vocal Quality: Normal;Low vocal intensity Volitional Cough: Weak Volitional Swallow: Able to elicit    Oral/Motor/Sensory Function Overall Oral Motor/Sensory Function: Moderate impairment Facial ROM: Within Functional Limits Facial  Symmetry: Within Functional Limits Facial Strength: Within Functional Limits Lingual ROM: Within Functional Limits Lingual Symmetry: Within  Functional Limits Lingual Strength: Reduced;Suspected CN XII (hypoglossal) dysfunction Velum:  (impaired, but difficult to visualize) Mandible: Within Functional Limits   Ice Chips Ice chips: Impaired Presentation: Spoon Oral Phase Impairments: Reduced lingual movement/coordination Oral Phase Functional Implications: Oral holding;Oral residue;Prolonged oral transit Pharyngeal Phase Impairments: Cough - Delayed   Thin Liquid Thin Liquid: Impaired Presentation: Spoon Oral Phase Impairments: Reduced lingual movement/coordination Oral Phase Functional Implications: Oral holding Pharyngeal  Phase Impairments: Cough - Delayed;Multiple swallows    Nectar Thick Nectar Thick Liquid: Not tested   Honey Thick Honey Thick Liquid: Not tested   Puree Puree: Not tested   Solid     Solid: Not tested     Thank you,  Havery Moros, CCC-SLP [phone]  Sakari Raisanen 05/17/2021,5:52 PM

## 2021-05-17 NOTE — Progress Notes (Signed)
Patient sneezed and NG fell out. I notified the doctor, [place] Zierle-Ghosh. Patient requested NG tube to not be resubmitted and doctor agreed we could wait until morning.

## 2021-05-17 NOTE — Progress Notes (Signed)
   05/17/21 1312  Assess: MEWS Score  BP (!) 202/99  Pulse Rate 85  Resp 19  Level of Consciousness Alert  SpO2 100 %  O2 Device Room Air  Assess: MEWS Score  MEWS Temp 0  MEWS Systolic 2  MEWS Pulse 0  MEWS RR 0  MEWS LOC 0  MEWS Score 2  MEWS Score Color Yellow  Assess: if the MEWS score is Yellow or Red  Were vital signs taken at a resting state? Yes  Focused Assessment No change from prior assessment  Early Detection of Sepsis Score *See Row Information* Low  MEWS guidelines implemented *See Row Information* No, previously yellow, continue vital signs every 4 hours  Treat  MEWS Interventions Other (Comment) (BP elevated but not treatable per perameters)  Pain Scale 0-10  Pain Score 0  Take Vital Signs  Increase Vital Sign Frequency  Yellow: Q 2hr X 2 then Q 4hr X 2, if remains yellow, continue Q 4hrs  Escalate  MEWS: Escalate Yellow: discuss with charge nurse/RN and consider discussing with provider and RRT  Notify: Charge Nurse/RN  Name of Charge Nurse/RN Notified Maryann rn  Date Charge Nurse/RN Notified 05/17/21  Time Charge Nurse/RN Notified [id number]

## 2021-05-17 NOTE — Evaluation (Signed)
Occupational Therapy Evaluation Patient Details Name: Brandon French MRN: [id number] DOB: 1966-04-03 Today's Date: 05/17/2021   History of Present Illness Brandon French  is a 55 y.o. male, with history of DMII, HTN, HLD and more presents to the ED with a chief of weakness and slurred speech.  Patient reports that the onset of symptoms was around 2:30 PM.  He later then said he actually went to sleep at 2:30 PM and woke up at 5:30 PM with slurred speech.  He reports he is never had this problem before.  He has had left upper and lower extremity weakness as well.  He has been drooling as well.  He has had trouble swallowing.  Patient has had a history of stroke and started blood pressure medication after the last stroke.  He is difficult to understand due to slurred and garbled speech, but it sounds like the left-sided weakness was present after the last stroke.  He does think it is worse now though.  Patient reports that he was otherwise in his normal state of health up until this started this afternoon.  No other complaints at this time.   Clinical Impression   Pt in chair with PT upon arrival to room. Pt demonstrates weakness in L UE with reports of possible decrease from baseline weakness from previous stroke. Pt required min G to min A for sit to stand and functional ambulation in the hall. Pt presents with L upper quadrant visual field deficit that is reported at baseline. Pt is recommended for home health provided support for IADL's like grocery shopping and assist with mobility. Pt will benefit from continued OT in the hospital and recommended venue below to increase strength, balance, and endurance for safe ADL's.        Recommendations for follow up therapy are one component of a multi-disciplinary discharge planning process, led by the attending physician.  Recommendations may be updated based on patient status, additional functional criteria and insurance authorization.   Follow Up  Recommendations  Home health OT;Supervision - Intermittent (Supervision for mobility)    Equipment Recommendations  Tub/shower seat           Precautions / Restrictions Precautions Precautions: None Restrictions Weight Bearing Restrictions: No      Mobility Bed Mobility                    Transfers Overall transfer level: Needs assistance Equipment used: Straight cane Transfers: Sit to/from Stand;Stand Pivot Transfers Sit to Stand: Min guard;Min assist Stand pivot transfers: Min guard;Min assist       General transfer comment: Slow gain with mild R side lean using cane during ambulatory transfer from chair, to walking in hall, to back to chair.    Balance Overall balance assessment: Needs assistance         Standing balance support: Single extremity supported;During functional activity Standing balance-Leahy Scale: Fair Standing balance comment: fair using cane                           ADL either performed or assessed with clinical judgement   ADL Overall ADL's : Needs assistance/impaired                       Lower Body Dressing Details (indicate cue type and reason): Pt reports that he slides shoes on and off typically. Toilet Transfer: Min guard;Minimal assistance;Ambulation (cane) Toilet Transfer Details (indicate cue type and  reason): Partially simulated via pt sit to stand, ambulation in hall, and return to cane using single point cane.         Functional mobility during ADLs: Min guard;Minimal assistance;Cane General ADL Comments: Slow labored movement during mobility with R side lean.     Vision Baseline Vision/History:  (L visual field deficits from prvious stroke.) Ability to See in Adequate Light: 1 Impaired Patient Visual Report: No change from baseline Vision Assessment?: Yes Tracking/Visual Pursuits: Able to track stimulus in all quads without difficulty Visual Fields: Left superior homonymous quadranopsia  (Reproted to be a baseline issue.)                Pertinent Vitals/Pain Pain Assessment: Faces Pain Score: 0-No pain     Hand Dominance Right   Extremity/Trunk Assessment Upper Extremity Assessment Upper Extremity Assessment: LUE deficits/detail LUE Deficits / Details: 3-/5 shoulder flexion and abduction; 4/5 elbow extention, 4+/5 elbow flexion, 3+/5 wrist extension and flexion, 4-/5 grip. Pt reports deficits at baseline from previous stroke, but stated that it is slighlty worse than usual. LUE Sensation: WNL LUE Coordination: WNL   Lower Extremity Assessment Lower Extremity Assessment: Defer to PT evaluation   Cervical / Trunk Assessment Cervical / Trunk Assessment: Normal   Communication Communication Communication: Expressive difficulties   Cognition Arousal/Alertness: Awake/alert Behavior During Therapy: WFL for tasks assessed/performed Overall Cognitive Status: Within Functional Limits for tasks assessed                                                Home Living Family/patient expects to be discharged to:: Private residence Living Arrangements: Parent Available Help at Discharge: Family;Other (Comment) (Helps take care of parents per pt report.) Type of Home: House Home Access: Stairs to enter [employer] of Steps: 3 Entrance Stairs-Rails: Right Home Layout: One level     Bathroom Shower/Tub: Chief Strategy Officer: Handicapped height Bathroom Accessibility: Yes   Home Equipment: Environmental consultant - 2 wheels;Cane - single point          Prior Functioning/Environment Level of Independence: Independent with assistive device(s)        Comments: Pt used cane for household and community mobility        OT Problem List: Decreased strength;Decreased range of motion;Impaired balance (sitting and/or standing);Impaired vision/perception;Obesity;Impaired UE functional use      OT Treatment/Interventions: Self-care/ADL  training;Therapeutic exercise;Therapeutic activities;Neuromuscular education;Patient/family education;Balance training;Visual/perceptual remediation/compensation    OT Goals(Current goals can be found in the care plan section) Acute Rehab OT Goals Patient Stated Goal: return home OT Goal Formulation: With patient Time For Goal Achievement: 05/31/21 Potential to Achieve Goals: Good  OT Frequency: Min 2X/week               Co-evaluation PT/OT/SLP Co-Evaluation/Treatment: Yes Reason for Co-Treatment: To address functional/ADL transfers   OT goals addressed during session: Strengthening/ROM;ADL's and self-care      AM-PAC OT "6 Clicks" Daily Activity     Outcome Measure Help from another person eating meals?: None Help from another person taking care of personal grooming?: A Little Help from another person toileting, which includes using toliet, bedpan, or urinal?: A Little Help from another person bathing (including washing, rinsing, drying)?: A Little Help from another person to put on and taking off regular upper body clothing?: None Help from another person to put on and taking  off regular lower body clothing?: A Little 6 Click Score: 20   End of Session Equipment Utilized During Treatment:  (cane)  Activity Tolerance: Patient tolerated treatment well Patient left: in chair;with call bell/phone within reach  OT Visit Diagnosis: Other abnormalities of gait and mobility (R26.89);Unsteadiness on feet (R26.81);Muscle weakness (generalized) (M62.81);Other symptoms and signs involving the nervous system (R29.898);Hemiplegia and hemiparesis Hemiplegia - Right/Left: Left Hemiplegia - dominant/non-dominant: Non-Dominant Hemiplegia - caused by: Cerebral infarction (Previous cva as well.)                Time: 0315-9458 OT Time Calculation (min): 21 min Charges:  OT General Charges $OT Visit: 1 Visit OT Evaluation $OT Eval Low Complexity: 1 Low  Ethan Kasperski OT,  MOT  Danie Chandler 05/17/2021, 10:34 AM

## 2021-05-17 NOTE — Evaluation (Signed)
Physical Therapy Evaluation Patient Details Name: Brandon French MRN: [id number] DOB: 07-07-1966 Today's Date: 05/17/2021  History of Present Illness  Brandon French  is a 55 y.o. male, with history of DMII, HTN, HLD and more presents to the ED with a chief of weakness and slurred speech.  Patient reports that the onset of symptoms was around 2:30 PM.  He later then said he actually went to sleep at 2:30 PM and woke up at 5:30 PM with slurred speech.  He reports he is never had this problem before.  He has had left upper and lower extremity weakness as well.  He has been drooling as well.  He has had trouble swallowing.  Patient has had a history of stroke and started blood pressure medication after the last stroke.  He is difficult to understand due to slurred and garbled speech, but it sounds like the left-sided weakness was present after the last stroke.  He does think it is worse now though.  Patient reports that he was otherwise in his normal state of health up until this started this afternoon.  No other complaints at this time.   Clinical Impression  Patient functioning near baseline for functional mobility and gait with fair/good return for sit to stands, transfers and ambulating in room/hallways using SPC without loss of balance.  Patient presents with expressive aphasia and able to communicate effectively with yes/no questions and states he feels slightly weaker on left side beyond baseline residual weakness from previous CVA.  Patient tolerated sitting up in chair after therapy - nursing staff aware.  Patient will benefit from continued physical therapy in hospital and recommended venue below to increase strength, balance, endurance for safe ADLs and gait.          Recommendations for follow up therapy are one component of a multi-disciplinary discharge planning process, led by the attending physician.  Recommendations may be updated based on patient status, additional functional criteria and  insurance authorization.  Follow Up Recommendations Home health PT;Supervision for mobility/OOB;Supervision - Intermittent    Equipment Recommendations       Recommendations for Other Services       Precautions / Restrictions Precautions Precautions: None Restrictions Weight Bearing Restrictions: No      Mobility  Bed Mobility Overal bed mobility: Modified Independent             General bed mobility comments: slightly labored movement with HOB raised    Transfers Overall transfer level: Needs assistance Equipment used: Straight cane Transfers: Sit to/from Stand;Stand Pivot Transfers Sit to Stand: Min guard;Min assist Stand pivot transfers: Min guard;Min assist       General transfer comment: see OT notes  Ambulation/Gait Ambulation/Gait assistance: Supervision Gait Distance (Feet): 100 Feet Assistive device: Straight cane Gait Pattern/deviations: Step-through pattern;Decreased step length - left;Decreased stride length;Decreased dorsiflexion - left Gait velocity: decreased   General Gait Details: slightly labored cadenc with mostly step-through gait pattern without loss of balance, slightly decreased left ankle dorsifexion  Stairs            Wheelchair Mobility    Modified Rankin (Stroke Patients Only)       Balance Overall balance assessment: Needs assistance Sitting-balance support: Feet supported;No upper extremity supported Sitting balance-Leahy Scale: Good     Standing balance support: During functional activity;Single extremity supported Standing balance-Leahy Scale: Fair Standing balance comment: fair/good using SPC  Pertinent Vitals/Pain Pain Assessment: No/denies pain Pain Score: 0-No pain    Home Living Family/patient expects to be discharged to:: Private residence Living Arrangements: Parent Available Help at Discharge: Family;Other (Comment) Type of Home: House Home Access: Stairs  to enter Entrance Stairs-Rails: Right Entrance Stairs-Number of Steps: 3 Home Layout: One level Home Equipment: Walker - 2 wheels;Cane - single point      Prior Function Level of Independence: Independent with assistive device(s)         Comments: Retail buyer SPC, drives, shops     Hand Dominance   Dominant Hand: Right    Extremity/Trunk Assessment   Upper Extremity Assessment Upper Extremity Assessment: Defer to OT evaluation LUE Deficits / Details: 3-/5 shoulder flexion and abduction; 4/5 elbow extention, 4+/5 elbow flexion, 3+/5 wrist extension and flexion, 4-/5 grip. Pt reports deficits at baseline from previous stroke, but stated that it is slighlty worse than usual. LUE Sensation: WNL LUE Coordination: WNL    Lower Extremity Assessment Lower Extremity Assessment: Overall WFL for tasks assessed;LLE deficits/detail LLE Deficits / Details: grossly -4/5 except ankle dorsiflexion 3/5 LLE Sensation: WNL LLE Coordination: decreased fine motor    Cervical / Trunk Assessment Cervical / Trunk Assessment: Normal  Communication   Communication: Expressive difficulties  Cognition Arousal/Alertness: Awake/alert Behavior During Therapy: WFL for tasks assessed/performed Overall Cognitive Status: Within Functional Limits for tasks assessed                                        General Comments      Exercises     Assessment/Plan    PT Assessment Patient needs continued PT services  PT Problem List Decreased strength;Decreased activity tolerance;Decreased balance;Decreased mobility       PT Treatment Interventions DME instruction;Gait training;Stair training;Functional mobility training;Therapeutic activities;Therapeutic exercise;Balance training;Patient/family education    PT Goals (Current goals can be found in the Care Plan section)  Acute Rehab PT Goals Patient Stated Goal: return home PT Goal Formulation: With patient Time For  Goal Achievement: 05/19/21 Potential to Achieve Goals: Good    Frequency Min 3X/week   Barriers to discharge        Co-evaluation PT/OT/SLP Co-Evaluation/Treatment: Yes Reason for Co-Treatment: Complexity of the patient's impairments (multi-system involvement);To address functional/ADL transfers PT goals addressed during session: Mobility/safety with mobility;Balance;Proper use of DME OT goals addressed during session: Strengthening/ROM;ADL's and self-care       AM-PAC PT "6 Clicks" Mobility  Outcome Measure Help needed turning from your back to your side while in a flat bed without using bedrails?: None Help needed moving from lying on your back to sitting on the side of a flat bed without using bedrails?: A Little Help needed moving to and from a bed to a chair (including a wheelchair)?: A Little Help needed standing up from a chair using your arms (e.g., wheelchair or bedside chair)?: None Help needed to walk in hospital room?: A Little Help needed climbing 3-5 steps with a railing? : A Little 6 Click Score: 20    End of Session   Activity Tolerance: Patient tolerated treatment well;Patient limited by fatigue Patient left: in chair;with call bell/phone within reach Nurse Communication: Mobility status PT Visit Diagnosis: Unsteadiness on feet (R26.81);Other abnormalities of gait and mobility (R26.89);Muscle weakness (generalized) (M62.81)    Time: 6010-9323 PT Time Calculation (min) (ACUTE ONLY): 33 min   Charges:   PT Evaluation $PT Eval  Moderate Complexity: 1 Mod PT Treatments $Therapeutic Activity: 23-37 mins        12:18 PM, 05/17/21 Ocie Bob, MPT Physical Therapist with [facility] [phone] office [phone] mobile phone

## 2021-05-17 NOTE — ED Notes (Signed)
Last bp filed by mistake at 0000

## 2021-05-17 NOTE — TOC Initial Note (Signed)
Transition of Care [facility]) - Initial/Assessment Note    Patient Details  Name: Brandon French MRN: [id number] Date of Birth: [birth year]  Transition of Care [facility]) CM/SW Contact:    Villa Herb, LCSWA Phone Number: 05/17/2021, 3:39 PM  Clinical Narrative:                 TOC conuslted for [facility] needs. CSW spoke with pt on the phone about PT recommending HH. Pt is agreeable to referral being made. CSW reached out to [place] with [facility] who accepts referral for [facility] services. CSW asked MD to place [facility] orders. TOC to follow.   Expected Discharge Plan: Home w Home Health Services Barriers to Discharge: Continued Medical Work up   Patient Goals and CMS Choice Patient states their goals for this hospitalization and ongoing recovery are:: Return home with [facility] CMS Medicare.gov Compare Post Acute Care list provided to:: Patient Choice offered to / list presented to : Patient  Expected Discharge Plan and Services Expected Discharge Plan: Home w Home Health Services In-house Referral: Clinical Social Work Discharge Planning Services: CM Consult Post Acute Care Choice: Home Health Living arrangements for the past 2 months: Single Family Home                                      Prior Living Arrangements/Services Living arrangements for the past 2 months: Single Family Home Lives with:: Self Patient language and need for interpreter reviewed:: Yes Do you feel safe going back to the place where you live?: Yes      Need for Family Participation in Patient Care: Yes (Comment) Care giver support system in place?: Yes (comment)   Criminal Activity/Legal Involvement Pertinent to Current Situation/Hospitalization: No - Comment as needed  Activities of Daily Living Home Assistive Devices/Equipment: Cane (specify quad or straight) ADL Screening (condition at time of admission) Patient's cognitive ability adequate to safely complete daily activities?: Yes Is the patient deaf or have  difficulty hearing?: No Does the patient have difficulty seeing, even when wearing glasses/contacts?: No Does the patient have difficulty concentrating, remembering, or making decisions?: No Patient able to express need for assistance with ADLs?: Yes Does the patient have difficulty dressing or bathing?: No Independently performs ADLs?: Yes (appropriate for developmental age) Does the patient have difficulty walking or climbing stairs?: No Weakness of Legs: Left Weakness of Arms/Hands: None  Permission Sought/Granted                  Emotional Assessment Appearance:: Appears stated age Attitude/Demeanor/Rapport: Engaged Affect (typically observed): Accepting Orientation: : Oriented to Self, Oriented to Place, Oriented to  Time, Oriented to Situation Alcohol / Substance Use: Not Applicable Psych Involvement: No (comment)  Admission diagnosis:  Stroke [facility]) [I63.9] Acute ischemic stroke [facility]) [I63.9] Acute CVA (cerebrovascular accident) [facility]) [I63.9] Patient Active Problem List   Diagnosis Date Noted   Acute CVA (cerebrovascular accident) (HCC) 05/17/2021   Stroke (HCC) 05/16/2021   DMII (diabetes mellitus, type 2) (HCC) 05/16/2021   HTN (hypertension) 05/16/2021   HLD (hyperlipidemia) 05/16/2021   PCP:  Pcp, No Pharmacy:  No Pharmacies Listed    Social Determinants of Health (SDOH) Interventions    Readmission Risk Interventions Readmission Risk Prevention Plan 05/17/2021  Medication Screening Complete  Transportation Screening Complete

## 2021-05-17 NOTE — ED Notes (Signed)
Pt went to MRI at 0705 hrs, will assess on return.

## 2021-05-17 NOTE — Progress Notes (Signed)
Patient ID: Brandon French, male   DOB: [birth year], 55 y.o.   MRN: [id number]  PROGRESS NOTE    Brandon French  MWU:[id number] DOB: 1966-07-23 DOA: 05/16/2021 PCP: Pcp, No   Brief Narrative:  55 year old male with history of diabetes mellitus type 2, hypertension, hyperlipidemia, unspecified stroke with possible residual left-sided weakness presented with weakness and slurred speech.  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  Neurology was consulted and recommended MRI of brain and MRA of brain and neck along with echo and official neurology consultation.  He failed swallow evaluation and NG tube was placed.  Assessment & Plan:   Possible acute unspecified CVA in a patient with history of unspecified CVA and residual left-sided weakness -Presented with slurred speech and weakness. 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  Neurology was consulted and recommended MRI of brain and MRA of brain and neck along with echo and official neurology consultation.  He failed swallow evaluation and NG tube was placed. -MRI of brain pending. -PT/OT/SLP evaluation. -2D echo. -LDL 139; A1c 6.6 -Continue aspirin, Plavix and statin  Diabetes mellitus type 2 -Continue CBGs with SSI.  A1c 6.6.  Metformin on hold  Hypertension -Allow for permissive hypertension: Catapres, hydrochlorothiazide and metoprolol on hold. -Use antihypertensives if blood pressure is more than 220/120  Hyperlipidemia -Continue statin  Chronic kidney disease stage IIIb -Last known creatinine around 1.9-2 around 2 years ago. -Creatinine currently stable.  Monitor.  Continue IV fluids for now   DVT prophylaxis: Heparin Code Status: Full Family Communication: None at bedside Disposition Plan: Status is: Observation  The patient will require care spanning > 2 midnights and should be moved to inpatient because: Possibility of stroke, failed  swallow eval and currently requiring NG tube  Consultants: Neurology  Procedures: None  Antimicrobials: None   Subjective: Patient seen and examined at bedside.  Still has slurred speech and hence cannot communicate well.  No overnight fever, vomiting, seizures reported.  Objective: Vitals:   05/17/21 0440 05/17/21 0600 05/17/21 0810 05/17/21 1005  BP: (!) 192/100 (!) 204/90 (!) 185/172 (!) 199/122  Pulse: 78 67 91 90  Resp: 18 20 (!) 23 12  Temp:      TempSrc:      SpO2: 98% 97% 97% 94%  Weight:      Height:        Intake/Output Summary (Last 24 hours) at 05/17/2021 1042 Last data filed at 05/17/2021 7253 Gross per 24 hour  Intake --  Output 1900 ml  Net -1900 ml   Filed Weights   05/16/21 1907  Weight: (!) 145.2 kg    Examination:  General exam: Looks older than stated age.  Currently on room air. ENT: NG tube present. Respiratory system: Bilateral decreased breath sounds at bases with some scattered crackles and intermittent tachypnea Cardiovascular system: S1 & S2 heard, Rate controlled Gastrointestinal system: Abdomen is morbidly obese, nondistended, soft and nontender. Normal bowel sounds heard. Extremities: No cyanosis, clubbing, edema  Central nervous system: Awake, speech is slurred.  Left-sided weakness present  Skin: No rashes, lesions or ulcers Psychiatry: Could not be assessed because of mental status.   Data Reviewed: I have personally reviewed following labs and imaging studies  CBC: Recent Labs  Lab 05/16/21 1834 05/17/21 0408  WBC 8.5 7.5  NEUTROABS 5.0  --   HGB 12.8* 12.1*  HCT 41.6 40.6  MCV 80.5 80.1  PLT 382 361  Basic Metabolic Panel: Recent Labs  Lab 05/16/21 1834 05/17/21 0408  NA 139 140  K 3.7 3.3*  CL 105 106  CO2 27 28  GLUCOSE 99 138*  BUN 30* 25*  CREATININE 2.07* 1.80*  CALCIUM 8.2* 8.3*   GFR: Estimated Creatinine Clearance: 70.2 mL/min (A) (by C-G formula based on SCr of 1.8 mg/dL (H)). Liver Function  Tests: Recent Labs  Lab 05/16/21 1834 05/17/21 0408  AST 24 17  ALT 17 15  ALKPHOS 56 51  BILITOT 0.5 0.6  PROT 8.0 7.1  ALBUMIN 3.6 3.3*   No results for input(s): LIPASE, AMYLASE in the last 168 hours. No results for input(s): AMMONIA in the last 168 hours. Coagulation Profile: Recent Labs  Lab 05/16/21 1834  INR 1.0   Cardiac Enzymes: No results for input(s): CKTOTAL, CKMB, CKMBINDEX, TROPONINI in the last 168 hours. BNP (last 3 results) No results for input(s): PROBNP in the last 8760 hours. HbA1C: Recent Labs    05/17/21 0408  HGBA1C 6.6*   CBG: Recent Labs  Lab 05/16/21 1837 05/16/21 2343 05/17/21 0809 05/17/21 0842  GLUCAP 97 126* 141* 121*   Lipid Profile: Recent Labs    05/17/21 0408  CHOL 191  HDL 40*  LDLCALC 139*  TRIG 61  CHOLHDL 4.8   Thyroid Function Tests: Recent Labs    05/17/21 0408  TSH 1.208   Anemia Panel: No results for input(s): VITAMINB12, FOLATE, FERRITIN, TIBC, IRON, RETICCTPCT in the last 72 hours. Sepsis Labs: No results for input(s): PROCALCITON, LATICACIDVEN in the last 168 hours.  Recent Results (from the past 240 hour(s))  Resp Panel by RT-PCR (Flu A&B, Covid) Nasopharyngeal Swab     Status: None   Collection Time: 05/16/21  7:35 PM   Specimen: Nasopharyngeal Swab; Nasopharyngeal(NP) swabs in vial transport medium  Result Value Ref Range Status   SARS Coronavirus 2 by RT PCR NEGATIVE NEGATIVE Final    Comment: (NOTE) SARS-CoV-2 target nucleic acids are NOT DETECTED.  The SARS-CoV-2 RNA is generally detectable in upper respiratory specimens during the acute phase of infection. The lowest concentration of SARS-CoV-2 viral copies this assay can detect is 138 copies/mL. A negative result does not preclude SARS-Cov-2 infection and should not be used as the sole basis for treatment or other patient management decisions. A negative result may occur with  improper specimen collection/handling, submission of specimen  other than nasopharyngeal swab, presence of viral mutation(s) within the areas targeted by this assay, and inadequate number of viral copies(<138 copies/mL). A negative result must be combined with clinical observations, patient history, and epidemiological information. The expected result is Negative.  Fact Sheet for Patients:  BloggerCourse.com  Fact Sheet for Healthcare Providers:  SeriousBroker.it  This test is no t yet approved or cleared by the [place] FDA and  has been authorized for detection and/or diagnosis of SARS-CoV-2 by FDA under an Emergency Use Authorization (EUA). This EUA will remain  in effect (meaning this test can be used) for the duration of the COVID-19 declaration under Section 564(b)(1) of the Act, 21 U.S.C.section 360bbb-3(b)(1), unless the authorization is terminated  or revoked sooner.       Influenza A by PCR NEGATIVE NEGATIVE Final   Influenza B by PCR NEGATIVE NEGATIVE Final    Comment: (NOTE) The Xpert Xpress SARS-CoV-2/FLU/RSV plus assay is intended as an aid in the diagnosis of influenza from Nasopharyngeal swab specimens and should not be used as a sole basis for treatment. Nasal washings and aspirates are  unacceptable for Xpert Xpress SARS-CoV-2/FLU/RSV testing.  Fact Sheet for Patients: BloggerCourse.com  Fact Sheet for Healthcare Providers: SeriousBroker.it  This test is not yet approved or cleared by the [place] FDA and has been authorized for detection and/or diagnosis of SARS-CoV-2 by FDA under an Emergency Use Authorization (EUA). This EUA will remain in effect (meaning this test can be used) for the duration of the COVID-19 declaration under Section 564(b)(1) of the Act, 21 U.S.C. section 360bbb-3(b)(1), unless the authorization is terminated or revoked.  Performed at [facility], [address], [place], [place]  [address]       Scheduled Meds:   stroke: mapping our early stages of recovery book   Does not apply Once   aspirin  325 mg Per NG tube Daily   atorvastatin  40 mg Oral Daily   [START ON 05/18/2021] clopidogrel  75 mg Per NG tube Daily   heparin  5,000 Units Subcutaneous Q8H   insulin aspart  0-5 Units Subcutaneous QHS   insulin aspart  0-9 Units Subcutaneous TID WC   Continuous Infusions:  sodium chloride 75 mL/hr at 05/17/21 1013          Berl Bonfanti, MD [facility] 05/17/2021, 10:42 AM

## 2021-05-17 NOTE — Plan of Care (Signed)
  Problem: Nutrition: Goal: Risk of aspiration will decrease Outcome: Progressing   Problem: Education: Goal: Knowledge of secondary prevention will improve Outcome: Progressing   Problem: Ischemic Stroke/TIA Tissue Perfusion: Goal: Complications of ischemic stroke/TIA will be minimized Outcome: Progressing   Problem: Education: Goal: Knowledge of General Education information will improve Description: Including pain rating scale, medication(s)/side effects and non-pharmacologic comfort measures Outcome: Progressing   Problem: Health Behavior/Discharge Planning: Goal: Ability to manage health-related needs will improve Outcome: Progressing   Problem: Clinical Measurements: Goal: Ability to maintain clinical measurements within normal limits will improve Outcome: Progressing

## 2021-05-17 NOTE — Consult Note (Signed)
[facility] Florine Sprenkle A. Gerilyn Pilgrim, MD     [email or web address]          MATSON WELCH is an 55 y.o. male.   ASSESSMENT/PLAN: ACUTE LEFT CAPSULAR INFARCT: This is most likely a lacunar event. Dual antiplatelet agents are recommended for 1 month. Afterwards, Plavix 75 mg is recommended. Blood pressure control pressure control recommended. Optimization of statin treatment for dyslipidemia is also recommended. Additional labs will also be done.  Likely untreated significant obstructive sleep apnea syndrome which increases risk of uncontrol hypertension and stroke. Highly recommended the patient to be evaluated and treated for this. Obesity     This is a 55 year old right-handed black male who presents with the acute onset weakness of the left side. Patient also difficulties with his talking with severe slurring of his speech. The patient to the hospital for further evaluation and has been treated appropriately.  The history is very difficult because he is severely dysarthric. The review systems is consequently very limited.  Blood pressure (!) 216/114, pulse 83, temperature 98.3 F (36.8 C), temperature source Oral, resp. rate 20, height 6' (1.829 m), weight (!) 145.1 kg, SpO2 100 %.   GENERAL:  This is morbidly obese male who appears to be in some discomfort.  HEENT:  There is excessive drooling due to significant dysphagia. He has poor dentition with excessive gingival disease and calculus buildup.  ABDOMEN: soft  EXTREMITIES:  Nonpitting edema of the legs  BACK: normal  SKIN:  some scaly appearance of the distal lower extremities bilaterally  MENTAL STATUS:  He is awake and alert and cooperates with evaluation. He is severely dysarthric to the point being essentially mute. He is able to write some words and seemed to have good comprehension follows commands briskly.  CRANIAL NERVES: Pupils are equal, round and reactive to light and accomodation; extra ocular movements are  full, there is no significant nystagmus; visual fields are full; upper and lower facial muscles are normal in strength and symmetric, there is no flattening of the nasolabial folds; tongue is midline  MOTOR:  Left upper and left lower extremity weak and graded as 4-/ 5. Bulk and tone are normal. The right side shows normal tone, bulk and strength. There is mild drift of the left upper extremity. No drift on the right.  COORDINATION: Left finger to nose is normal, right finger to nose is normal, No rest tremor; no intention tremor; no postural tremor; no bradykinesia.  REFLEXES: Deep tendon reflexes are symmetrical and normal but diminished in the legs.   SENSATION: Normal to light touch, temperature, and pain.    History reviewed. No pertinent past medical history.  History reviewed. No pertinent surgical history.  History reviewed. No pertinent family history.  Social History:  reports that he has never smoked. He has never used smokeless tobacco. He reports that he does not drink alcohol and does not use drugs.  Allergies:  Allergies  Allergen Reactions   Aleve [Naproxen]     Medications: Prior to Admission medications   Medication Sig Start Date End Date Taking? Authorizing Provider  cloNIDine (CATAPRES) 0.1 MG tablet Take 0.1 mg by mouth 2 (two) times daily.   Yes [provider]  hydrochlorothiazide (HYDRODIURIL) 25 MG tablet Take 25 mg by mouth daily.   Yes [provider]  metFORMIN (GLUCOPHAGE) 500 MG tablet Take 500 mg by mouth daily with breakfast. 03/06/19  Yes [provider]  atorvastatin (LIPITOR) 40 MG tablet Take 1 tablet by mouth daily. Patient  not taking: No sig reported 03/06/19   [provider]  finasteride (PROSCAR) 5 MG tablet Take by mouth. Patient not taking: No sig reported 03/29/19   [provider]  metoprolol succinate (TOPROL-XL) 100 MG 24 hr tablet Take 1 tablet by mouth daily. Patient not taking: No sig  reported 03/06/19   [provider]  tamsulosin (FLOMAX) 0.4 MG CAPS capsule Take 1 capsule by mouth daily. Patient not taking: No sig reported 03/29/19   [provider]    Scheduled Meds:  aspirin  325 mg Per NG tube Daily   atorvastatin  40 mg Oral Daily   [START ON 05/18/2021] clopidogrel  75 mg Per NG tube Daily   heparin  5,000 Units Subcutaneous Q8H   [START ON 05/18/2021] influenza vac split quadrivalent PF  0.5 mL Intramuscular Tomorrow-1000   insulin aspart  0-5 Units Subcutaneous QHS   insulin aspart  0-9 Units Subcutaneous TID WC   Continuous Infusions:  sodium chloride 75 mL/hr at 05/17/21 1359   PRN Meds:.acetaminophen **OR** acetaminophen (TYLENOL) oral liquid 160 mg/5 mL **OR** acetaminophen, labetalol     Results for orders placed or performed during the hospital encounter of 05/16/21 (from the past 48 hour(s))  Ethanol     Status: None   Collection Time: 05/16/21  6:34 PM  Result Value Ref Range   Alcohol, Ethyl (B) <10 <10 mg/dL    Comment: (NOTE) Lowest detectable limit for serum alcohol is 10 mg/dL.  For medical purposes only. Performed at [facility], [address] [place], [place] [address]   Protime-INR     Status: None   Collection Time: 05/16/21  6:34 PM  Result Value Ref Range   Prothrombin Time 13.0 11.4 - 15.2 seconds   INR 1.0 0.8 - 1.2    Comment: (NOTE) INR goal varies based on device and disease states. Performed at [facility], [address] [place], [place] [address]   APTT     Status: None   Collection Time: 05/16/21  6:34 PM  Result Value Ref Range   aPTT 26 24 - 36 seconds    Comment: Performed at [facility], [address] [place], [place] [address]  CBC     Status: Abnormal   Collection Time: 05/16/21  6:34 PM  Result Value Ref Range   WBC 8.5 4.0 - 10.5 K/uL   RBC 5.17 4.22 - 5.81 MIL/uL   Hemoglobin 12.8 (L) 13.0 - 17.0 g/dL   HCT 62.6 94.8 - 54.6 %   MCV 80.5 80.0 - 100.0 fL   MCH 24.8 (L) 26.0 -  34.0 pg   MCHC 30.8 30.0 - 36.0 g/dL   RDW 27.0 35.0 - 09.3 %   Platelets 382 150 - 400 K/uL   nRBC 0.0 0.0 - 0.2 %    Comment: Performed at [facility], [address] [place], [place] [address]  Differential     Status: None   Collection Time: 05/16/21  6:34 PM  Result Value Ref Range   Neutrophils Relative % 58 %   Neutro Abs 5.0 1.7 - 7.7 K/uL   Lymphocytes Relative 29 %   Lymphs Abs 2.4 0.7 - 4.0 K/uL   Monocytes Relative 9 %   Monocytes Absolute 0.7 0.1 - 1.0 K/uL   Eosinophils Relative 3 %   Eosinophils Absolute 0.2 0.0 - 0.5 K/uL   Basophils Relative 1 %   Basophils Absolute 0.1 0.0 - 0.1 K/uL   Immature Granulocytes 0 %  Abs Immature Granulocytes 0.02 0.00 - 0.07 K/uL    Comment: Performed at [facility], [address] [place], [place] [address]  Comprehensive metabolic panel     Status: Abnormal   Collection Time: 05/16/21  6:34 PM  Result Value Ref Range   Sodium 139 135 - 145 mmol/L   Potassium 3.7 3.5 - 5.1 mmol/L   Chloride 105 98 - 111 mmol/L   CO2 27 22 - 32 mmol/L   Glucose, Bld 99 70 - 99 mg/dL    Comment: Glucose reference range applies only to samples taken after fasting for at least 8 hours.   BUN 30 (H) 6 - 20 mg/dL   Creatinine, Ser 6.04 (H) 0.61 - 1.24 mg/dL   Calcium 8.2 (L) 8.9 - 10.3 mg/dL   Total Protein 8.0 6.5 - 8.1 g/dL   Albumin 3.6 3.5 - 5.0 g/dL   AST 24 15 - 41 U/L   ALT 17 0 - 44 U/L   Alkaline Phosphatase 56 38 - 126 U/L   Total Bilirubin 0.5 0.3 - 1.2 mg/dL   GFR, Estimated 38 (L) >60 mL/min    Comment: (NOTE) Calculated using the CKD-EPI Creatinine Equation (2021)    Anion gap 7 5 - 15    Comment: Performed at [facility], [address], [place], [place] [address]  CBG monitoring, ED     Status: None   Collection Time: 05/16/21  6:37 PM  Result Value Ref Range   Glucose-Capillary 97 70 - 99 mg/dL    Comment: Glucose reference range applies only to samples taken after fasting for at least 8 hours.  Urine rapid drug screen  (hosp performed)     Status: None   Collection Time: 05/16/21  6:39 PM  Result Value Ref Range   Opiates NONE DETECTED NONE DETECTED   Cocaine NONE DETECTED NONE DETECTED   Benzodiazepines NONE DETECTED NONE DETECTED   Amphetamines NONE DETECTED NONE DETECTED   Tetrahydrocannabinol NONE DETECTED NONE DETECTED   Barbiturates NONE DETECTED NONE DETECTED    Comment: (NOTE) DRUG SCREEN FOR MEDICAL PURPOSES ONLY.  IF CONFIRMATION IS NEEDED FOR ANY PURPOSE, NOTIFY LAB WITHIN 5 DAYS.  LOWEST DETECTABLE LIMITS FOR URINE DRUG SCREEN Drug Class                     Cutoff (ng/mL) Amphetamine and metabolites    1000 Barbiturate and metabolites    200 Benzodiazepine                 200 Tricyclics and metabolites     300 Opiates and metabolites        300 Cocaine and metabolites        300 THC                            50 Performed at [facility], [address], [place], [place] [address]   Urinalysis, Routine w reflex microscopic Urine, Clean Catch     Status: Abnormal   Collection Time: 05/16/21  6:39 PM  Result Value Ref Range   Color, Urine STRAW (A) YELLOW   APPearance CLEAR CLEAR   Specific Gravity, Urine 1.009 1.005 - 1.030   pH 8.0 5.0 - 8.0   Glucose, UA NEGATIVE NEGATIVE mg/dL   Hgb urine dipstick SMALL (A) NEGATIVE   Bilirubin Urine NEGATIVE NEGATIVE   Ketones, ur NEGATIVE NEGATIVE mg/dL   Protein, ur 30 (A) NEGATIVE mg/dL  Nitrite NEGATIVE NEGATIVE   Leukocytes,Ua NEGATIVE NEGATIVE   RBC / HPF 0-5 0 - 5 RBC/hpf   WBC, UA 0-5 0 - 5 WBC/hpf   Bacteria, UA NONE SEEN NONE SEEN   Squamous Epithelial / LPF 0-5 0 - 5    Comment: Performed at [facility], [address], [place], [place] [address]  Resp Panel by RT-PCR (Flu A&B, Covid) Nasopharyngeal Swab     Status: None   Collection Time: 05/16/21  7:35 PM   Specimen: Nasopharyngeal Swab; Nasopharyngeal(NP) swabs in vial transport medium  Result Value Ref Range   SARS Coronavirus 2 by RT PCR NEGATIVE NEGATIVE    Comment:  (NOTE) SARS-CoV-2 target nucleic acids are NOT DETECTED.  The SARS-CoV-2 RNA is generally detectable in upper respiratory specimens during the acute phase of infection. The lowest concentration of SARS-CoV-2 viral copies this assay can detect is 138 copies/mL. A negative result does not preclude SARS-Cov-2 infection and should not be used as the sole basis for treatment or other patient management decisions. A negative result may occur with  improper specimen collection/handling, submission of specimen other than nasopharyngeal swab, presence of viral mutation(s) within the areas targeted by this assay, and inadequate number of viral copies(<138 copies/mL). A negative result must be combined with clinical observations, patient history, and epidemiological information. The expected result is Negative.  Fact Sheet for Patients:  BloggerCourse.com  Fact Sheet for Healthcare Providers:  SeriousBroker.it  This test is no t yet approved or cleared by the [place] FDA and  has been authorized for detection and/or diagnosis of SARS-CoV-2 by FDA under an Emergency Use Authorization (EUA). This EUA will remain  in effect (meaning this test can be used) for the duration of the COVID-19 declaration under Section 564(b)(1) of the Act, 21 U.S.C.section 360bbb-3(b)(1), unless the authorization is terminated  or revoked sooner.       Influenza A by PCR NEGATIVE NEGATIVE   Influenza B by PCR NEGATIVE NEGATIVE    Comment: (NOTE) The Xpert Xpress SARS-CoV-2/FLU/RSV plus assay is intended as an aid in the diagnosis of influenza from Nasopharyngeal swab specimens and should not be used as a sole basis for treatment. Nasal washings and aspirates are unacceptable for Xpert Xpress SARS-CoV-2/FLU/RSV testing.  Fact Sheet for Patients: BloggerCourse.com  Fact Sheet for Healthcare  Providers: SeriousBroker.it  This test is not yet approved or cleared by the [place] FDA and has been authorized for detection and/or diagnosis of SARS-CoV-2 by FDA under an Emergency Use Authorization (EUA). This EUA will remain in effect (meaning this test can be used) for the duration of the COVID-19 declaration under Section 564(b)(1) of the Act, 21 U.S.C. section 360bbb-3(b)(1), unless the authorization is terminated or revoked.  Performed at [facility], [address] [place], [place] [address]   CBG monitoring, ED     Status: Abnormal   Collection Time: 05/16/21 11:43 PM  Result Value Ref Range   Glucose-Capillary 126 (H) 70 - 99 mg/dL    Comment: Glucose reference range applies only to samples taken after fasting for at least 8 hours.  HIV Antibody (routine testing w rflx)     Status: None   Collection Time: 05/17/21  4:08 AM  Result Value Ref Range   HIV Screen 4th Generation wRfx Non Reactive Non Reactive    Comment: Performed at [facility], [address] [place], [place] [address]  Lipid panel     Status: Abnormal   Collection Time: 05/17/21  4:08 AM  Result Value Ref Range   Cholesterol 191 0 - 200 mg/dL   Triglycerides 61 <832 mg/dL   HDL 40 (L) >91 mg/dL   Total CHOL/HDL Ratio 4.8 RATIO   VLDL 12 0 - 40 mg/dL   LDL Cholesterol 916 (H) 0 - 99 mg/dL    Comment:        Total Cholesterol/HDL:CHD Risk Coronary Heart Disease Risk Table                     Men   Women  1/2 Average Risk   3.4   3.3  Average Risk       5.0   4.4  2 X Average Risk   9.6   7.1  3 X Average Risk  23.4   11.0        Use the calculated Patient Ratio above and the CHD Risk Table to determine the patient's CHD Risk.        ATP III CLASSIFICATION (LDL):  <100     mg/dL   Optimal  606-004  mg/dL   Near or Above                    Optimal  130-159  mg/dL   Borderline  599-774  mg/dL   High  >142     mg/dL   Very High Performed at [facility], [address], [place], [place] [address]   Hemoglobin A1c     Status: Abnormal   Collection Time: 05/17/21  4:08 AM  Result Value Ref Range   Hgb A1c MFr Bld 6.6 (H) 4.8 - 5.6 %    Comment: (NOTE) Pre diabetes:          5.7%-6.4%  Diabetes:              >6.4%  Glycemic control for   <7.0% adults with diabetes    Mean Plasma Glucose 142.72 mg/dL    Comment: Performed at [facility], [address] [place], [place] [address]  Comprehensive metabolic panel     Status: Abnormal   Collection Time: 05/17/21  4:08 AM  Result Value Ref Range   Sodium 140 135 - 145 mmol/L   Potassium 3.3 (L) 3.5 - 5.1 mmol/L   Chloride 106 98 - 111 mmol/L   CO2 28 22 - 32 mmol/L   Glucose, Bld 138 (H) 70 - 99 mg/dL    Comment: Glucose reference range applies only to samples taken after fasting for at least 8 hours.   BUN 25 (H) 6 - 20 mg/dL   Creatinine, Ser 3.56 (H) 0.61 - 1.24 mg/dL   Calcium 8.3 (L) 8.9 - 10.3 mg/dL   Total Protein 7.1 6.5 - 8.1 g/dL   Albumin 3.3 (L) 3.5 - 5.0 g/dL   AST 17 15 - 41 U/L   ALT 15 0 - 44 U/L   Alkaline Phosphatase 51 38 - 126 U/L   Total Bilirubin 0.6 0.3 - 1.2 mg/dL   GFR, Estimated 44 (L) >60 mL/min    Comment: (NOTE) Calculated using the CKD-EPI Creatinine Equation (2021)    Anion gap 6 5 - 15    Comment: Performed at [facility], [address] [place], [place] [address]  CBC     Status: Abnormal   Collection Time: 05/17/21  4:08 AM  Result Value Ref Range   WBC 7.5 4.0 - 10.5 K/uL   RBC 5.07 4.22 - 5.81 MIL/uL  Hemoglobin 12.1 (L) 13.0 - 17.0 g/dL   HCT 62.9 52.8 - 41.3 %   MCV 80.1 80.0 - 100.0 fL   MCH 23.9 (L) 26.0 - 34.0 pg   MCHC 29.8 (L) 30.0 - 36.0 g/dL   RDW 24.4 01.0 - 27.2 %   Platelets 361 150 - 400 K/uL   nRBC 0.0 0.0 - 0.2 %    Comment: Performed at [facility], [address], [place], [place] [address]  TSH     Status: None   Collection Time: 05/17/21  4:08 AM  Result Value Ref Range   TSH 1.208 0.350 - 4.500 uIU/mL     Comment: Performed by a 3rd Generation assay with a functional sensitivity of <=0.01 uIU/mL. Performed at [facility], [address] [place], [place] [address]   CBG monitoring, ED     Status: Abnormal   Collection Time: 05/17/21  8:09 AM  Result Value Ref Range   Glucose-Capillary 141 (H) 70 - 99 mg/dL    Comment: Glucose reference range applies only to samples taken after fasting for at least 8 hours.  CBG monitoring, ED     Status: Abnormal   Collection Time: 05/17/21  8:42 AM  Result Value Ref Range   Glucose-Capillary 121 (H) 70 - 99 mg/dL    Comment: Glucose reference range applies only to samples taken after fasting for at least 8 hours.  CBG monitoring, ED     Status: Abnormal   Collection Time: 05/17/21 11:14 AM  Result Value Ref Range   Glucose-Capillary 125 (H) 70 - 99 mg/dL    Comment: Glucose reference range applies only to samples taken after fasting for at least 8 hours.   Comment 1 Notify RN   Glucose, capillary     Status: Abnormal   Collection Time: 05/17/21  3:58 PM  Result Value Ref Range   Glucose-Capillary 108 (H) 70 - 99 mg/dL    Comment: Glucose reference range applies only to samples taken after fasting for at least 8 hours.    Studies/Results:   BRAIN MRI MRA FINDINGS: MRI HEAD FINDINGS   Brain: Acute nonhemorrhagic 12 mm infarct is present in the posterior limb of the left internal capsule or lateral thalamus.   Remote nonhemorrhagic lacunar infarcts are present in the thalami bilaterally. Multiple remote lacunar infarcts are present in the corona radiata bilaterally. Multiple remote lacunar infarcts are present within the right paramedian brainstem and cerebellum bilaterally. A larger area of encephalomalacia is consistent with a remote left superior cerebellar infarct.   Multiple foci of susceptibility are present in the posterior and medial thalami bilaterally. Additional subcortical susceptibility foci are present in the operculum  bilaterally, left greater than right and scattered throughout the subcortical frontal lobes.   No acute hemorrhage is present. No mass lesion is present. The ventricles are of normal size. No significant extraaxial fluid collection is present. The internal auditory canals are within normal limits.   Vascular: Flow is present in the major intracranial arteries.   Skull and upper cervical spine: The craniocervical junction is normal. Upper cervical spine is within normal limits. Marrow signal is unremarkable.   Sinuses/Orbits: The globes and orbits are within normal limits. Extensive left mastoid effusion is present. Small right mastoid effusion is present. No obstructing nasopharyngeal lesion is present. The paranasal sinuses are otherwise clear.   MRA HEAD FINDINGS   Internal carotid arteries are within normal limits from the high cervical segments through the ICA termini. Artifactual signal loss is  present at the skull base on the right. Mild narrowing is present at the proximal left A1 segment. No significant A1 or M1 segment stenosis is present. MCA bifurcations are intact. Signal loss is present the proximal MCA branch vessels bilaterally. Given more rib os distal MCA branch vessel signal, this is likely in part artifactual. There is some asymmetric perfusion to left MCA branch vessels.   The left vertebral artery is the dominant vessel. Moderate narrowing is present in the distal right V4 segment. There is some irregularity of the basilar artery without a focal basilar artery stenosis. Prominent posterior cerebral arteries are present bilaterally, right greater than left. Moderate to high-grade proximal P1 segment stenoses are present bilaterally. The right posterior cerebral artery is not visualized beyond the proximal P2 segment. A high-grade left P2 segment stenosis is present with poor signal distally.   MRA NECK FINDINGS   Time-of-flight images demonstrate no  significant flow disturbance to either carotid artery bifurcation. Flow is antegrade in the vertebral arteries bilaterally.   Postcontrast images demonstrate no significant stenosis at the aortic arch. Common origin of the left common carotid artery and not a min artery is present. The right common carotid artery is within normal limits. Bifurcation is unremarkable for only mild atherosclerotic disease. No significant stenosis. Cervical right ICA is normal.   The left common carotid artery is mildly tortuous. Mild narrowing of less than 50% is suggested in the mid left common carotid artery. Minimal atherosclerotic changes present at the left carotid bifurcation without a significant stenosis. The left ICA is within normal limits.   The left vertebral artery is dominant. Distal right V4 segment stenosis confirmed. Moderate distal basilar artery stenosis is likely present. 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   IMPRESSION: 1. Acute nonhemorrhagic 12 mm infarct in the posterior limb of the left internal capsule or lateral thalamus. 2. Remote nonhemorrhagic lacunar infarcts of the thalami bilaterally with additional remote lacunar infarcts of the corona radiata bilaterally. This is likely the sequelae of the high-grade proximal P1 segment stenoses with impact on the meticulous straight arteries. 3. Multiple remote lacunar infarcts of the right paramedian brainstem and cerebellum bilaterally. 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 4. Asymmetric attenuation to left MCA branch vessels suggesting either proximal MCA narrowing or overall decreased perfusion. 5. Moderate to high-grade proximal P1 segment stenoses bilaterally. 6. Moderate distal basilar artery stenosis. 7. Less than 50% stenosis of the mid left common carotid artery.       Brain MRI is reviewed in person. There is a left thalamic capsular lacunar type infarct is acute. 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 There several infarcts involving the right pontine area bilateral thalamic regions and basal ganglia. No hemorrhages noted.   Helia Haese A. Gerilyn Pilgrim, M.D.  Diplomate, Biomedical engineer of Psychiatry and Neurology ( Neurology). 05/17/2021, 6:09 PM

## 2021-05-18 ENCOUNTER — Inpatient Hospital Stay (HOSPITAL_COMMUNITY): Payer: Medicare HMO

## 2021-05-18 DIAGNOSIS — I6389 Other cerebral infarction: Secondary | ICD-10-CM | POA: Diagnosis not present

## 2021-05-18 LAB — ECHOCARDIOGRAM COMPLETE
AR max vel: 2.55 cm2
AV Area VTI: 2.86 cm2
AV Area mean vel: 2.96 cm2
AV Mean grad: 5 mmHg
AV Peak grad: 9.6 mmHg
Ao pk vel: 1.55 m/s
Area-P 1/2: 2.8 cm2
Height: 72 in
MV VTI: 3.17 cm2
S' Lateral: 3.1 cm
Weight: 5118.2 oz

## 2021-05-18 LAB — BASIC METABOLIC PANEL
Anion gap: 6 (ref 5–15)
BUN: 23 mg/dL — ABNORMAL HIGH (ref 6–20)
CO2: 27 mmol/L (ref 22–32)
Calcium: 8.2 mg/dL — ABNORMAL LOW (ref 8.9–10.3)
Chloride: 105 mmol/L (ref 98–111)
Creatinine, Ser: 1.83 mg/dL — ABNORMAL HIGH (ref 0.61–1.24)
GFR, Estimated: 44 mL/min — ABNORMAL LOW (ref >60)
Glucose, Bld: 110 mg/dL — ABNORMAL HIGH (ref 70–99)
Potassium: 3.2 mmol/L — ABNORMAL LOW (ref 3.5–5.1)
Sodium: 138 mmol/L (ref 135–145)

## 2021-05-18 LAB — GLUCOSE, CAPILLARY
Glucose-Capillary: 98 mg/dL (ref 70–99)
Glucose-Capillary: 103 mg/dL — ABNORMAL HIGH (ref 70–99)
Glucose-Capillary: 114 mg/dL — ABNORMAL HIGH (ref 70–99)
Glucose-Capillary: 98 mg/dL (ref 70–99)

## 2021-05-18 LAB — VITAMIN B12: Vitamin B-12: 289 pg/mL (ref 180–914)

## 2021-05-18 MED ORDER — POTASSIUM CHLORIDE 10 MEQ/100ML IV SOLN
10.0000 meq | INTRAVENOUS | Status: AC
Start: 2021-05-18 — End: 2021-05-19
  Administered 2021-05-18 (×4): 10 meq via INTRAVENOUS
  Filled 2021-05-18 (×4): qty 100

## 2021-05-18 MED ORDER — INFLUENZA VAC SPLIT QUAD 0.5 ML IM SUSY
0.5000 mL | PREFILLED_SYRINGE | INTRAMUSCULAR | Status: AC
  Administered 2021-05-19: 0.5 mL via INTRAMUSCULAR
  Filled 2021-05-18: qty 0.5

## 2021-05-18 NOTE — Progress Notes (Signed)
Patient ID: Brandon French, male   DOB: 11/21/1965, 55 y.o.   MRN: [id number]  PROGRESS NOTE    Brandon French  GYI:[id number] DOB: [birth year] DOA: 05/16/2021 PCP: Pcp, No   Brief Narrative:  55 year old male with history of diabetes mellitus type 2, hypertension, hyperlipidemia, unspecified stroke with possible residual left-sided weakness presented with weakness and slurred speech.  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  Neurology was consulted and recommended MRI of brain and MRA of brain and neck along with echo and official neurology consultation.  He failed swallow evaluation and NG tube was placed.  Assessment & Plan:   1)Acute CVA- Acute stroke with motor, sensory and speech and swallowing deficits -MRI Brain , MRA Head and Neck with Acute nonhemorrhagic 12 mm infarct in the posterior limb of the left internal capsule or lateral thalamus findings consistent with basilar artery stenosis -Also showed multiple remote nonhemorrhagic lacunar infarcts in multiple locations -PTA had residua/old left-sided weakness -Now with new pronounced speech and swallowing difficulties, unsteady gait and right-sided weakness -PT/OT/speech eval appreciated -Recommending inpatient rehab/CIR -2D echo with EF of 65 to 70%, cannot rule out hypertrophic cardiomyopathy-May benefit from cardiac MRI as outpatient -LDL 139; A1c 6.6 -Continue aspirin, Plavix and statin  2) possible hypertrophic cardiomyopathy --- echo showed 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 -Use antihypertensives if blood  pressure is more than 220/120  5)Chronic kidney disease stage IIIb -Last known creatinine around 1.9-2 around 2 years ago. -Creatinine currently stable.  Monitor.  Continue IV fluids for now   DVT prophylaxis: Heparin Code Status: Full Family Communication: Mother at bedside  Disposition Plan: Status is: Inpatient  The patient will require care spanning > 2 midnights and should be moved to inpatient because: Acute stroke with motor, sensory and speech and swallowing deficits  Consultants: Neurology  Procedures: None  Antimicrobials: None   Subjective: -Patient's mother and physical therapist are at bedside -Patient having significant difficulties with transfers and mobility very unsteady -No Chest pains or dizziness.  Objective: Vitals:   05/18/21 0127 05/18/21 0333 05/18/21 0502 05/18/21 1344  BP: (!) 211/108 (!) 195/110 (!) 169/93 (!) 166/153  Pulse: 94 89 82 93  Resp: 20 19 20    Temp: 98.5 F (36.9 C) 98.4 F (36.9 C) 98.1 F (36.7 C) 98 F (36.7 C)  TempSrc: Oral Oral  Oral  SpO2: 95% 96% 99% 99%  Weight:      Height:        Intake/Output Summary (Last 24 hours) at 05/18/2021 1631 Last data filed at 05/18/2021 1500 Gross per 24 hour  Intake 1973.03 ml  Output 1500 ml  Net 473.03 ml   Filed Weights   05/16/21 1907 05/17/21 1312  Weight: (!) 145.2 kg (!) 145.1 kg    Examination: Physical Exam  Gen:- Awake Alert, morbidly obese, in no acute distress HEENT:- [place].AT, No sclera icterus Neck-Supple Neck,No JVD,.  Lungs-  CTAB , fair air movement CV- S1, S2 normal, RRR Abd-  +ve B.Sounds, Abd Soft, No tenderness,    Extremity/Skin:- No  edema,   good pedal pulses Psych-affect is appropriate, oriented x3 Neuro-old/residual left-sided weakness, now with some new right-sided weakness and unsteady  gait , no tremors    Data Reviewed: I have personally reviewed following labs and imaging studies  CBC: Recent Labs  Lab 05/16/21 1834 05/17/21 0408  WBC  8.5 7.5  NEUTROABS 5.0  --   HGB 12.8* 12.1*  HCT 41.6 40.6  MCV 80.5 80.1  PLT 382 361   Basic Metabolic Panel: Recent Labs  Lab 05/16/21 1834 05/17/21 0408 05/18/21 0555  NA 139 140 138  K 3.7 3.3* 3.2*  CL 105 106 105  CO2 27 28 27   GLUCOSE 99 138* 110*  BUN 30* 25* 23*  CREATININE 2.07* 1.80* 1.83*  CALCIUM 8.2* 8.3* 8.2*   GFR: Estimated Creatinine Clearance: 69.1 mL/min (A) (by C-G formula based on SCr of 1.83 mg/dL (H)). Liver Function Tests: Recent Labs  Lab 05/16/21 1834 05/17/21 0408  AST 24 17  ALT 17 15  ALKPHOS 56 51  BILITOT 0.5 0.6  PROT 8.0 7.1  ALBUMIN 3.6 3.3*   No results for input(s): LIPASE, AMYLASE in the last 168 hours. No results for input(s): AMMONIA in the last 168 hours. Coagulation Profile: Recent Labs  Lab 05/16/21 1834  INR 1.0   Cardiac Enzymes: No results for input(s): CKTOTAL, CKMB, CKMBINDEX, TROPONINI in the last 168 hours. BNP (last 3 results) No results for input(s): PROBNP in the last 8760 hours. HbA1C: Recent Labs    05/17/21 0408  HGBA1C 6.6*   CBG: Recent Labs  Lab 05/17/21 1558 05/17/21 2007 05/18/21 0740 05/18/21 1107 05/18/21 1608  GLUCAP 108* 118* 98 103* 114*   Lipid Profile: Recent Labs    05/17/21 0408  CHOL 191  HDL 40*  LDLCALC 139*  TRIG 61  CHOLHDL 4.8   Thyroid Function Tests: Recent Labs    05/17/21 0408  TSH 1.208   Anemia Panel: Recent Labs    05/18/21 0948  VITAMINB12 289   Sepsis Labs: No results for input(s): PROCALCITON, LATICACIDVEN in the last 168 hours.  Recent Results (from the past 240 hour(s))  Resp Panel by RT-PCR (Flu A&B, Covid) Nasopharyngeal Swab     Status: None   Collection Time: 05/16/21  7:35 PM   Specimen: Nasopharyngeal Swab; Nasopharyngeal(NP) swabs in vial transport medium  Result Value Ref Range Status   SARS Coronavirus 2 by RT PCR NEGATIVE NEGATIVE Final    Comment: (NOTE) SARS-CoV-2 target nucleic acids are NOT DETECTED.  The SARS-CoV-2  RNA is generally detectable in upper respiratory specimens during the acute phase of infection. The lowest concentration of SARS-CoV-2 viral copies this assay can detect is 138 copies/mL. A negative result does not preclude SARS-Cov-2 infection and should not be used as the sole basis for treatment or other patient management decisions. A negative result may occur with  improper specimen collection/handling, submission of specimen other than nasopharyngeal swab, presence of viral mutation(s) within the areas targeted by this assay, and inadequate number of viral copies(<138 copies/mL). A negative result must be combined with clinical observations, patient history, and epidemiological information. The expected result is Negative.  Fact Sheet for Patients:  05/18/21  Fact Sheet for Healthcare Providers:  BloggerCourse.com  This test is no t yet approved or cleared by the [place] FDA and  has been authorized for detection and/or diagnosis of SARS-CoV-2 by FDA under an Emergency Use Authorization (EUA). This EUA will remain  in effect (meaning this test can be used) for the duration of the COVID-19 declaration under Section 564(b)(1) of the Act, 21 U.S.C.section 360bbb-3(b)(1), unless the authorization is terminated  or revoked sooner.       Influenza A by PCR NEGATIVE NEGATIVE Final   Influenza B by PCR NEGATIVE NEGATIVE Final    Comment: (NOTE) The Xpert Xpress SARS-CoV-2/FLU/RSV plus assay is intended as an aid in the diagnosis of influenza from Nasopharyngeal swab specimens and should not be used as a sole basis for treatment. Nasal washings and aspirates are unacceptable for Xpert Xpress SARS-CoV-2/FLU/RSV testing.  Fact Sheet for Patients: BloggerCourse.com  Fact Sheet for Healthcare Providers: SeriousBroker.it  This test is not yet approved or cleared by the  [place] FDA and has been authorized for detection and/or diagnosis of SARS-CoV-2 by FDA under an Emergency Use Authorization (EUA). This EUA will remain in effect (meaning this test can be used) for the duration of the COVID-19 declaration under Section 564(b)(1) of the Act, 21 U.S.C. section 360bbb-3(b)(1), unless the authorization is terminated or revoked.  Performed at [facility], [address], [place], [place] [address]       Scheduled Meds:  aspirin  325 mg Per NG tube Daily   atorvastatin  40 mg Oral Daily   clopidogrel  75 mg Per NG tube Daily   heparin  5,000 Units Subcutaneous Q8H   [START ON 05/19/2021] influenza vac split quadrivalent PF  0.5 mL Intramuscular Tomorrow-1000   insulin aspart  0-5 Units Subcutaneous QHS   insulin aspart  0-9 Units Subcutaneous TID WC   Continuous Infusions:  sodium chloride 75 mL/hr at 05/18/21 0954    Shon Hale, MD [facility] 05/18/2021, 4:31 PM

## 2021-05-18 NOTE — Progress Notes (Signed)
Patient requested ice chips. Per Speech pathology note it is stated that oral care recommendations are oral care prior to ice chips. Sent secure chat to MD Zierle-Ghosh regarding this. Per her it is okay for patient to have ice chips. Ice chips given to patient. Will continue to monitor.

## 2021-05-18 NOTE — Progress Notes (Signed)
Modified Barium Swallow Progress Note  Patient Details  Name: LEMOINE GOYNE MRN: [id number] Date of Birth: [birth year]  Today's Date: 05/18/2021  Modified Barium Swallow completed.  Full report located under Chart Review in the Imaging Section.  Brief recommendations include the following:  Clinical Impression  Pt presents with severe oral phase and mild/moderate pharyngeal phase characterized by reduced labial closure, impaired lingual movement, and impaired mastication resulting in labial spillage, reduced oral containment, impaired anterior posterior transit, premature spillage, and oral residuals; pharyngeal phase is marked by delay in swallow initiation (negatively impacted by premature spillage with liquids) and reduced laryngeal vestibule closure resulting in variable penetration and aspiration of liquids (thin, nectar, honey) which Pt inconsistently sensed via cough (not removed). Pt tolerated teaspoon presentations of honey thick liquids best. He required verbal cues to move his tongue and to "chew" in order to try and engage oral phase. Although manipulation of semi-solids was challenging, this will be good for him to practice via trials with SLP. He appears to have the greatest difficulty moving the anterior portion of his tongue, once he was able to move the pill posteriorly, he swallowed in puree without incident. Pt tells SLP that he would not want a feeding tube, however Pt may benefit from PEG in order to meet nutritional needs (suspect fatigue will prevent him from eating enough by mouth with modified diet). Can initiate D1(puree) and HTL (honey thick liquids) via self presented teaspoons and trials mech soft with SLP. Will discuss with MD.    Swallow Evaluation Recommendations       SLP Diet Recommendations: Dysphagia 1 (Puree) solids;Honey thick liquids (via teaspoon presentation)   Liquid Administration via: Spoon   Medication Administration: Crushed with puree    Supervision: Patient able to self feed;Full supervision/cueing for compensatory strategies   Compensations: Small sips/bites;Lingual sweep for clearance of pocketing;Multiple dry swallows after each bite/sip   Postural Changes: Remain semi-upright after after feeds/meals (Comment);Seated upright at 90 degrees   Oral Care Recommendations: Oral care before and after PO   Other Recommendations: Order thickener from pharmacy;Prohibited food (jello, ice cream, thin soups);Clarify dietary restrictions   Thank you,  Havery Moros, CCC-SLP [phone]  Feiga Nadel 05/18/2021,1:32 PM

## 2021-05-18 NOTE — Progress Notes (Signed)
Speech Language Pathology Treatment: Dysphagia  Patient Details Name: Brandon French MRN: [id number] DOB: [birth year] Today's Date: 05/18/2021 Time: 4098-1191 SLP Time Calculation (min) (ACUTE ONLY): 18 min  Assessment / Plan / Recommendation Clinical Impression  Pt appears to have greater weakness on the right side (orofacial) today than yesterday. Speech/dysarthria also seems worse today. Pt presented with an ice chip and he says he was able to swallow- difficult to determine at bedside. Will proceed with MBSS later today, Pt accidentally pulled NG over night. Above to RN.      HPI HPI: 55 year old male with history of diabetes mellitus type 2, hypertension, hyperlipidemia, unspecified stroke with possible residual left-sided weakness presented with weakness and slurred speech.  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  He failed swallow screen and NG tube was placed. MRI shows Acute nonhemorrhagic 12 mm infarct in the posterior limb of the left internal capsule or lateral thalamus. BSE and SLE requested.      SLP Plan  MBS      Recommendations for follow up therapy are one component of a multi-disciplinary discharge planning process, led by the attending physician.  Recommendations may be updated based on patient status, additional functional criteria and insurance authorization.    Recommendations  Diet recommendations: NPO Medication Administration: Via alternative means                Oral Care Recommendations: Oral care QID;Oral care prior to ice chip/H20;Staff/trained caregiver to provide oral care Follow up Recommendations: Outpatient SLP;Home health SLP SLP Visit Diagnosis: Dysarthria and anarthria (R47.1);Dysphagia, unspecified (R13.10) Plan: MBS       Thank you,  Brandon French, CCC-SLP [phone]                 Glenville Espina 05/18/2021, 10:04 AM

## 2021-05-18 NOTE — Progress Notes (Signed)
Physical Therapy Treatment Patient Details Name: Brandon French MRN: [id number] DOB: 1965-10-17 Today's Date: 05/18/2021   History of Present Illness Brandon French  is a 55 y.o. male, with history of DMII, HTN, HLD and more presents to the ED with a chief of weakness and slurred speech.  Patient reports that the onset of symptoms was around 2:30 PM.  He later then said he actually went to sleep at 2:30 PM and woke up at 5:30 PM with slurred speech.  He reports he is never had this problem before.  He has had left upper and lower extremity weakness as well.  He has been drooling as well.  He has had trouble swallowing.  Patient has had a history of stroke and started blood pressure medication after the last stroke.  He is difficult to understand due to slurred and garbled speech, but it sounds like the left-sided weakness was present after the last stroke.  He does think it is worse now though.  Patient reports that he was otherwise in his normal state of health up until this started this afternoon.  No other complaints at this time.    PT Comments    Patient presents in chair and agreeable for therapy - his mother in room.  Patient unable to complete sit to stands using cane due to RLE sliding forward resulting in falling backwards into chair, had to use RW for safety demonstrating slow labored movement to complete transfers and required Min assist to lift BLE during sit to supine.  Patient slightly unsteady using RW with slow labored cadence during ambulation and mostly limited due to fatigue.  Patient tolerated staying up in chair after therapy with his mother present in room.  Patient will benefit from continued physical therapy in hospital and recommended venue below to increase strength, balance, endurance for safe ADLs and gait.    Recommendations for follow up therapy are one component of a multi-disciplinary discharge planning process, led by the attending physician.  Recommendations may be  updated based on patient status, additional functional criteria and insurance authorization.  Follow Up Recommendations  CIR     Equipment Recommendations  None recommended by PT    Recommendations for Other Services       Precautions / Restrictions Precautions Precautions: Fall Restrictions Weight Bearing Restrictions: No     Mobility  Bed Mobility Overal bed mobility: Needs Assistance Bed Mobility: Supine to Sit;Sit to Supine     Supine to sit: Supervision Sit to supine: Min assist   General bed mobility comments: had difficulty lifting BLE onto be during sit to supine due to weakness    Transfers Overall transfer level: Needs assistance Equipment used: Straight cane;Rolling walker (2 wheeled) Transfers: Sit to/from [employer] Sit to Stand: Min assist Stand pivot transfers: Min assist       General transfer comment: very unsteady with frequent sliding forward with RLE and unable to use SPC for sit to stands due to loss of balance, required use of RW for safety  Ambulation/Gait Ambulation/Gait assistance: Min assist Gait Distance (Feet): 30 Feet Assistive device: Rolling walker (2 wheeled) Gait Pattern/deviations: Decreased step length - right;Decreased step length - left;Decreased stride length;Decreased dorsiflexion - left Gait velocity: decreased   General Gait Details: slow labored cadence with slightly decreased left ankle dorsiflexion, no loss of balance, limited mostly due to fatigue   Stairs             Wheelchair Mobility    Modified Rankin (  Stroke Patients Only)       Balance Overall balance assessment: Needs assistance Sitting-balance support: Feet supported;No upper extremity supported Sitting balance-Leahy Scale: Good Sitting balance - Comments: seated at EOB   Standing balance support: During functional activity;Single extremity supported Standing balance-Leahy Scale: Poor Standing balance comment: fair using  RW                            Cognition Arousal/Alertness: Awake/alert Behavior During Therapy: WFL for tasks assessed/performed Overall Cognitive Status: Within Functional Limits for tasks assessed                                        Exercises      General Comments        Pertinent Vitals/Pain Pain Assessment: No/denies pain    Home Living                      Prior Function            PT Goals (current goals can now be found in the care plan section) Acute Rehab PT Goals Patient Stated Goal: return home after rehab PT Goal Formulation: With patient/family Time For Goal Achievement: 06/07/21 Potential to Achieve Goals: Good Progress towards PT goals: Progressing toward goals    Frequency    Min 4X/week      PT Plan Discharge plan needs to be updated    Co-evaluation              AM-PAC PT "6 Clicks" Mobility   Outcome Measure  Help needed turning from your back to your side while in a flat bed without using bedrails?: None Help needed moving from lying on your back to sitting on the side of a flat bed without using bedrails?: A Little Help needed moving to and from a bed to a chair (including a wheelchair)?: A Little Help needed standing up from a chair using your arms (e.g., wheelchair or bedside chair)?: A Little Help needed to walk in hospital room?: A Lot Help needed climbing 3-5 steps with a railing? : A Lot 6 Click Score: 17    End of Session   Activity Tolerance: Patient tolerated treatment well;Patient limited by fatigue Patient left: in chair;with call bell/phone within reach;with family/visitor present Nurse Communication: Mobility status PT Visit Diagnosis: Unsteadiness on feet (R26.81);Other abnormalities of gait and mobility (R26.89);Muscle weakness (generalized) (M62.81)     Time: 3825-0539 PT Time Calculation (min) (ACUTE ONLY): 22 min  Charges:  $Therapeutic Activity: 8-22 mins                      3:50 PM, 05/18/21 Ocie Bob, MPT Physical Therapist with [facility] [phone] office [phone] mobile phone

## 2021-05-18 NOTE — Progress Notes (Signed)
*  PRELIMINARY RESULTS* Echocardiogram 2D Echocardiogram has been performed.  Carolyne Fiscal 05/18/2021, 9:53 AM

## 2021-05-19 LAB — CBC
HCT: 40.8 % (ref 39.0–52.0)
Hemoglobin: 12.3 g/dL — ABNORMAL LOW (ref 13.0–17.0)
MCH: 24 pg — ABNORMAL LOW (ref 26.0–34.0)
MCHC: 30.1 g/dL (ref 30.0–36.0)
MCV: 79.5 fL — ABNORMAL LOW (ref 80.0–100.0)
Platelets: 297 10*3/uL (ref 150–400)
RBC: 5.13 MIL/uL (ref 4.22–5.81)
RDW: 14.7 % (ref 11.5–15.5)
WBC: 8.1 10*3/uL (ref 4.0–10.5)
nRBC: 0 % (ref 0.0–0.2)

## 2021-05-19 LAB — RENAL FUNCTION PANEL
Albumin: 3.3 g/dL — ABNORMAL LOW (ref 3.5–5.0)
Anion gap: 9 (ref 5–15)
BUN: 25 mg/dL — ABNORMAL HIGH (ref 6–20)
CO2: 22 mmol/L (ref 22–32)
Calcium: 8.4 mg/dL — ABNORMAL LOW (ref 8.9–10.3)
Chloride: 108 mmol/L (ref 98–111)
Creatinine, Ser: 1.9 mg/dL — ABNORMAL HIGH (ref 0.61–1.24)
GFR, Estimated: 42 mL/min — ABNORMAL LOW (ref >60)
Glucose, Bld: 110 mg/dL — ABNORMAL HIGH (ref 70–99)
Phosphorus: 2.9 mg/dL (ref 2.5–4.6)
Potassium: 3.5 mmol/L (ref 3.5–5.1)
Sodium: 139 mmol/L (ref 135–145)

## 2021-05-19 LAB — GLUCOSE, CAPILLARY
Glucose-Capillary: 107 mg/dL — ABNORMAL HIGH (ref 70–99)
Glucose-Capillary: 104 mg/dL — ABNORMAL HIGH (ref 70–99)
Glucose-Capillary: 123 mg/dL — ABNORMAL HIGH (ref 70–99)
Glucose-Capillary: 131 mg/dL — ABNORMAL HIGH (ref 70–99)

## 2021-05-19 LAB — RPR: RPR Ser Ql: NONREACTIVE

## 2021-05-19 LAB — HOMOCYSTEINE: Homocysteine: 14.1 umol/L (ref 0.0–14.5)

## 2021-05-19 MED ORDER — ASPIRIN EC 81 MG PO TBEC
81.0000 mg | DELAYED_RELEASE_TABLET | Freq: Every day | ORAL | Status: DC
  Administered 2021-05-20: 81 mg via ORAL
  Filled 2021-05-19: qty 1

## 2021-05-19 MED ORDER — PANTOPRAZOLE SODIUM 40 MG PO TBEC
40.0000 mg | DELAYED_RELEASE_TABLET | Freq: Every day | ORAL | Status: DC
  Administered 2021-05-19 – 2021-05-20 (×2): 40 mg via ORAL
  Filled 2021-05-19 (×2): qty 1

## 2021-05-19 MED ORDER — LABETALOL HCL 5 MG/ML IV SOLN: 10.0000 mg | INTRAVENOUS | Status: DC | PRN

## 2021-05-19 MED ORDER — AMLODIPINE BESYLATE 5 MG PO TABS
5.0000 mg | ORAL_TABLET | Freq: Every day | ORAL | Status: DC
  Administered 2021-05-19 – 2021-05-20 (×2): 5 mg via ORAL
  Filled 2021-05-19: qty 1

## 2021-05-19 NOTE — Progress Notes (Signed)
Patient ID: Brandon French, male   DOB: Dec 20, 1965, 55 y.o.   MRN: [id number]  PROGRESS NOTE    Brandon French  HFW:[id number] DOB: [birth year] DOA: 05/16/2021 PCP: Pcp, No   Brief Narrative:  55 year old male with history of diabetes mellitus type 2, hypertension, hyperlipidemia, unspecified stroke with possible residual left-sided weakness presented with weakness and slurred speech.  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  Neurology was consulted and recommended MRI of brain and MRA of brain and neck along with echo and official neurology consultation.  He failed swallow evaluation and NG tube was placed, patient pulled NG tube out -Now awaiting placement to inpatient rehab/CIR after PEG tube placement  Assessment & Plan:   1)Acute CVA- Acute stroke with motor, sensory and speech and swallowing deficits -MRI Brain , MRA Head and Neck with Acute nonhemorrhagic 12 mm infarct in the posterior limb of the left internal capsule or lateral thalamus findings consistent with basilar artery stenosis -Also showed multiple remote nonhemorrhagic lacunar infarcts in multiple locations -PTA had residual/old left-sided weakness -Now with new pronounced speech and swallowing difficulties, unsteady gait and right-sided weakness -PT/OT/speech eval appreciated -Recommending inpatient rehab/CIR -2D echo with EF of 65 to 70%, cannot rule out hypertrophic cardiomyopathy-May benefit from cardiac MRI as outpatient -LDL 139; A1c 6.6 -Continue aspirin, Plavix and lipitor  2)FEN/Dysphagia--speech eval appreciated speech and swallowing difficulties actually of wash and, patient is now agreeable to getting the PEG tube -General surgery consult for PEG tube placement for feeding purposes requested  3)Possible hypertrophic cardiomyopathy --- echo showed 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 -Creatinine currently stable.  Monitor.  Continue IV fluids for now  6)Diabetes mellitus type 2--A1c 6.6 reflecting good diabetic control PTA -Hold metformin Use Novolog/Humalog Sliding scale insulin with Accu-Cheks/Fingersticks as ordered   DVT prophylaxis: Heparin Code Status: Full Family Communication: Mother at bedside  Disposition Plan:-- awaiting placement to inpatient rehab/CIR after PEG tube placement Status is: Inpatient  The patient will require care spanning > 2 midnights and should be moved to inpatient because: Acute stroke with motor, sensory and speech and swallowing deficits  Consultants: Neurology  Procedures: None  Antimicrobials: None   Subjective: -Significant speech and swallowing difficulties persist -Patient did not like pured diet today Mother at bedside -No chest pains no palpitations -  Objective: Vitals:   05/18/21 1344 05/18/21 1931 05/18/21 2009 05/19/21 0416  BP: (!) 166/153 (!) 201/104 (!) 183/101 (!) 185/107  Pulse: 93 75 62 94  Resp:  18  20  Temp: 98 F (36.7 C) 98.4 F (36.9 C)  98.3 F (36.8 C)  TempSrc: Oral     SpO2: 99% 98%  100%  Weight:      Height:        Intake/Output Summary (Last 24 hours) at 05/19/2021 1006 Last data filed at 05/19/2021 0500 Gross per 24 hour  Intake 1973.03 ml  Output 1550 ml  Net 423.03 ml   Filed Weights   05/16/21 1907 05/17/21 1312  Weight: (!) 145.2 kg (!) 145.1 kg    Examination: Physical Exam  Gen:-  Awake Alert, morbidly obese, in no acute distress HEENT:- [place].AT, No sclera icterus Neck-Supple Neck,No JVD,.  Lungs-  CTAB , fair air  movement CV- S1, S2 normal, RRR Abd-  +ve B.Sounds, Abd Soft, No tenderness, increased truncal adiposity Extremity/Skin:- No  edema,   good pedal pulses Psych-affect is appropriate, oriented x3 Neuro-old/residual left-sided weakness, now with some new right-sided weakness and unsteady gait , no tremors --Significant speech and swallowing difficulties persist    Data Reviewed: I have personally reviewed following labs and imaging studies  CBC: Recent Labs  Lab 05/16/21 1834 05/17/21 0408 05/19/21 0609  WBC 8.5 7.5 8.1  NEUTROABS 5.0  --   --   HGB 12.8* 12.1* 12.3*  HCT 41.6 40.6 40.8  MCV 80.5 80.1 79.5*  PLT 382 361 297   Basic Metabolic Panel: Recent Labs  Lab 05/16/21 1834 05/17/21 0408 05/18/21 0555 05/19/21 0609  NA 139 140 138 139  K 3.7 3.3* 3.2* 3.5  CL 105 106 105 108  CO2 27 28 27 22   GLUCOSE 99 138* 110* 110*  BUN 30* 25* 23* 25*  CREATININE 2.07* 1.80* 1.83* 1.90*  CALCIUM 8.2* 8.3* 8.2* 8.4*  PHOS  --   --   --  2.9   GFR: Estimated Creatinine Clearance: 66.5 mL/min (A) (by C-G formula based on SCr of 1.9 mg/dL (H)). Liver Function Tests: Recent Labs  Lab 05/16/21 1834 05/17/21 0408 05/19/21 0609  AST 24 17  --   ALT 17 15  --   ALKPHOS 56 51  --   BILITOT 0.5 0.6  --   PROT 8.0 7.1  --   ALBUMIN 3.6 3.3* 3.3*   No results for input(s): LIPASE, AMYLASE in the last 168 hours. No results for input(s): AMMONIA in the last 168 hours. Coagulation Profile: Recent Labs  Lab 05/16/21 1834  INR 1.0   Cardiac Enzymes: No results for input(s): CKTOTAL, CKMB, CKMBINDEX, TROPONINI in the last 168 hours. BNP (last 3 results) No results for input(s): PROBNP in the last 8760 hours. HbA1C: Recent Labs    05/17/21 0408  HGBA1C 6.6*   CBG: Recent Labs  Lab 05/18/21 0740 05/18/21 1107 05/18/21 1608 05/18/21 2010 05/19/21 0733  GLUCAP 98 103* 114* 98 107*   Lipid Profile: Recent Labs    05/17/21 0408  CHOL 191  HDL 40*  LDLCALC 139*   TRIG 61  CHOLHDL 4.8   Thyroid Function Tests: Recent Labs    05/17/21 0408  TSH 1.208   Anemia Panel: Recent Labs    05/18/21 0948  VITAMINB12 289   Sepsis Labs: No results for input(s): PROCALCITON, LATICACIDVEN in the last 168 hours.  Recent Results (from the past 240 hour(s))  Resp Panel by RT-PCR (Flu A&B, Covid) Nasopharyngeal Swab     Status: None   Collection Time: 05/16/21  7:35 PM   Specimen: Nasopharyngeal Swab; Nasopharyngeal(NP) swabs in vial transport medium  Result Value Ref Range Status   SARS Coronavirus 2 by RT PCR NEGATIVE NEGATIVE Final    Comment: (NOTE) SARS-CoV-2 target nucleic acids are NOT DETECTED.  The SARS-CoV-2 RNA is generally detectable in upper respiratory specimens during the acute phase of infection. The lowest concentration of SARS-CoV-2 viral copies this assay can detect is 138 copies/mL. A negative result does not preclude SARS-Cov-2 infection and should not be used as the sole basis for treatment or other patient management decisions. A negative result may occur with  improper specimen collection/handling, submission of specimen other than nasopharyngeal swab,  presence of viral mutation(s) within the areas targeted by this assay, and inadequate number of viral copies(<138 copies/mL). A negative result must be combined with clinical observations, patient history, and epidemiological information. The expected result is Negative.  Fact Sheet for Patients:  BloggerCourse.com  Fact Sheet for Healthcare Providers:  SeriousBroker.it  This test is no t yet approved or cleared by the [place] FDA and  has been authorized for detection and/or diagnosis of SARS-CoV-2 by FDA under an Emergency Use Authorization (EUA). This EUA will remain  in effect (meaning this test can be used) for the duration of the COVID-19 declaration under Section 564(b)(1) of the Act, 21 U.S.C.section  360bbb-3(b)(1), unless the authorization is terminated  or revoked sooner.       Influenza A by PCR NEGATIVE NEGATIVE Final   Influenza B by PCR NEGATIVE NEGATIVE Final    Comment: (NOTE) The Xpert Xpress SARS-CoV-2/FLU/RSV plus assay is intended as an aid in the diagnosis of influenza from Nasopharyngeal swab specimens and should not be used as a sole basis for treatment. Nasal washings and aspirates are unacceptable for Xpert Xpress SARS-CoV-2/FLU/RSV testing.  Fact Sheet for Patients: BloggerCourse.com  Fact Sheet for Healthcare Providers: SeriousBroker.it  This test is not yet approved or cleared by the [place] FDA and has been authorized for detection and/or diagnosis of SARS-CoV-2 by FDA under an Emergency Use Authorization (EUA). This EUA will remain in effect (meaning this test can be used) for the duration of the COVID-19 declaration under Section 564(b)(1) of the Act, 21 U.S.C. section 360bbb-3(b)(1), unless the authorization is terminated or revoked.  Performed at [facility], [address] [place], [place] [address]       Scheduled Meds:  amLODipine  5 mg Oral Daily   [START ON 05/20/2021] aspirin EC  81 mg Oral Q breakfast   atorvastatin  40 mg Oral Daily   clopidogrel  75 mg Per NG tube Daily   heparin  5,000 Units Subcutaneous Q8H   influenza vac split quadrivalent PF  0.5 mL Intramuscular Tomorrow-1000   insulin aspart  0-5 Units Subcutaneous QHS   insulin aspart  0-9 Units Subcutaneous TID WC   pantoprazole  40 mg Oral Daily   Continuous Infusions:  sodium chloride 75 mL/hr at 05/19/21 8366    Shon Hale, MD [facility] 05/19/2021, 10:06 AM

## 2021-05-19 NOTE — Progress Notes (Signed)
[place] Inpatient rehab admissions - I have called and opened the case for acute inpatient rehab admission.  Patient has [facility] and we will need authorization for a potential CIR admission.  I have tried to call his parents, but no response yet.  I will follow up after I hear back from insurance case manager.  Call me for questions.  [phone]

## 2021-05-19 NOTE — PMR Pre-admission (Signed)
PMR Admission Coordinator Pre-Admission Assessment  Patient: Brandon French is an 55 y.o., male MRN: [id number] DOB: 1965-09-27 Height: 6' (182.9 cm) Weight: (!) 145.1 kg  Insurance Information HMO: yes    PPO:      PCP:      IPA:      80/20:      OTHER:  PRIMARY: [employer]      Policy#: [id number]      Subscriber: pt CM Name: Cassie      Phone#: [phone] ext [phone]     Fax#: [phone] Pre-Cert#: [id number]   approved for 7 days and f/u with Pat at ext [phone]   Employer:  Benefits:  Phone #: [phone]     Name: 10/20 Eff. Date: 08/31/2020     Deduct: none      Out of Pocket Max: $3900      Life Max: none CIR: $295 copay per day days 1 until 6      SNF: no copay days 1 until 20; then $178 co pay per day days 21 until 100 Outpatient: $10 to $20 per visit     Co-Pay: visits per medical neccesity Home Health: 100%      Co-Pay: visits per medical neccesity DME: 80%     Co-Pay: 20% Providers: in network  SECONDARY: none      Policy#:      Phone#:   Artist:       Phone#:   The Data processing manager" for patients in Inpatient Rehabilitation Facilities with attached "Privacy Act Statement-Health Care Records" was provided and verbally reviewed with: Family  Emergency Contact Information Contact Information     Name Relation Home Work [place] Father [phone]  [phone]   Pickard,Ernestine Mother [phone]  [phone]       Current Medical History  Patient Admitting Diagnosis: CVA  History of Present Illness: 55 year old male with history of DM type 2, HTN, HLD and previous CVA. Presented on 05/16/2021 with weakness and slurred speech. 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 Neurology consulted.   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 Also showed multiple  remote no hemorrhagic lacunar infarcts. Pronounced speech and swallow difficulties. 2 d echo with EF 65 to 70%, cannot rule out hypertrophic cardiomyopathy. May benefit from outpatient cardiac MRI. LDL 139, Hgb A1c 6.6. Recommend continue asa , plavix and Lipitor.,   Speech evaluated for dysphagia with possible need for PEG if does not resolve. To begin Dysphagia 1 diet with honey thick liquids. Meds crushed with puree. MBS 10/19.   HTN and allowing for permissive hypertension, continue to hold Catapres, HCTZ and metoprolol. Added amlodipine. CKD stage IIIB, last known creat 1.9-2 around 2 years ago. DM holding metformin. Use Novolog /Humalog sliding scale with CBGS. Heparin for DVT prophylaxis.   Complete NIHSS TOTAL: 6  Patient's medical record from [facility] has been reviewed by the rehabilitation admission coordinator and physician.  Past Medical History  History reviewed. No pertinent past medical history.  Has the patient had major surgery during 100 days prior to admission? No  Family History   family history is not on file.  Current Medications  Current Facility-Administered Medications:    0.9 %  sodium chloride infusion, , Intravenous, Continuous, Emokpae, Courage, MD, Last Rate: 50 mL/hr at 05/20/21 0554, Infusion Verify at 05/20/21  2542   acetaminophen (TYLENOL) tablet 650 mg, 650 mg, Oral, Q4H PRN **OR** acetaminophen (TYLENOL) 160 MG/5ML solution 650 mg, 650 mg, Per Tube, Q4H PRN **OR** acetaminophen (TYLENOL) suppository 650 mg, 650 mg, Rectal, Q4H PRN, Zierle-Ghosh, Asia B, DO   amLODipine (NORVASC) tablet 5 mg, 5 mg, Oral, Daily, Emokpae, Courage, MD, 5 mg at 05/20/21 7062   aspirin EC tablet 81 mg, 81 mg, Oral, Q breakfast, Emokpae, Courage, MD, 81 mg at 05/20/21 0853   atorvastatin (LIPITOR) tablet 40 mg, 40 mg, Oral, Daily, Zierle-Ghosh, Asia B, DO, 40 mg at 05/20/21 0912   clopidogrel (PLAVIX) tablet 75 mg, 75 mg, Per NG tube, Daily, Zierle-Ghosh, Asia B, DO,  75 mg at 05/20/21 0912   heparin injection 5,000 Units, 5,000 Units, Subcutaneous, Q8H, Zierle-Ghosh, Asia B, DO, 5,000 Units at 05/20/21 0555   insulin aspart (novoLOG) injection 0-5 Units, 0-5 Units, Subcutaneous, QHS, Zierle-Ghosh, Asia B, DO   insulin aspart (novoLOG) injection 0-9 Units, 0-9 Units, Subcutaneous, TID WC, Zierle-Ghosh, Asia B, DO, 1 Units at 05/17/21 1139   labetalol (NORMODYNE) injection 10 mg, 10 mg, Intravenous, Q4H PRN, Emokpae, Courage, MD   pantoprazole (PROTONIX) EC tablet 40 mg, 40 mg, Oral, Daily, Emokpae, Courage, MD, 40 mg at 05/20/21 0919  Patients Current Diet:  Diet Order             Diet - low sodium heart healthy           DIET - DYS 1 Room service appropriate? Yes; Fluid consistency: Honey Thick  Diet effective now                  Precautions / Restrictions Precautions Precautions: Fall Restrictions Weight Bearing Restrictions: No   Has the patient had 2 or more falls or a fall with injury in the past year? No  Prior Activity Level Community (5-7x/wk): Independent and driving; H/O CVA in 3762  Prior Functional Level Self Care: Did the patient need help bathing, dressing, using the toilet or eating? Independent  Indoor Mobility: Did the patient need assistance with walking from room to room (with or without device)? Independent  Stairs: Did the patient need assistance with internal or external stairs (with or without device)? Independent  Functional Cognition: Did the patient need help planning regular tasks such as shopping or remembering to take medications? Independent  Patient Information Are you of Hispanic, Latino/a,or Spanish origin?: A. No, not of Hispanic, Latino/a, or Spanish origin What is your race?: B. Black or African American Do you need or want an interpreter to communicate with a doctor or health care staff?: 0. No  Patient's Response To:  Health Literacy and Transportation Is the patient able to respond to health  literacy and transportation needs?: Yes Health Literacy - How often do you need to have someone help you when you read instructions, pamphlets, or other written material from your doctor or pharmacy?: Never In the past 12 months, has lack of transportation kept you from medical appointments or from getting medications?: No In the past 12 months, has lack of transportation kept you from meetings, work, or from getting things needed for daily living?: No  Home Assistive Devices / Equipment Home Assistive Devices/Equipment: Medical laboratory scientific officer (specify quad or straight) Home Equipment: Walker - 2 wheels, Cane - single point  Prior Device Use: Indicate devices/aids used by the patient prior to current illness, exacerbation or injury?  Cane in the community  Current Functional Level Cognition  Arousal/Alertness: Awake/alert Overall Cognitive Status: Within  Functional Limits for tasks assessed Orientation Level: Oriented X4 Memory: Appears intact Awareness: Appears intact Problem Solving: Appears intact Safety/Judgment: Appears intact    Extremity Assessment (includes Sensation/Coordination)  Upper Extremity Assessment: Defer to OT evaluation LUE Deficits / Details: 3-/5 shoulder flexion and abduction; 4/5 elbow extention, 4+/5 elbow flexion, 3+/5 wrist extension and flexion, 4-/5 grip. Pt reports deficits at baseline from previous stroke, but stated that it is slighlty worse than usual. LUE Sensation: WNL LUE Coordination: WNL  Lower Extremity Assessment: Overall WFL for tasks assessed, LLE deficits/detail LLE Deficits / Details: grossly -4/5 except ankle dorsiflexion 3/5 LLE Sensation: WNL LLE Coordination: decreased fine motor    ADLs  Overall ADL's : Needs assistance/impaired Eating/Feeding:  (NG tube.) Grooming: Standing, Minimal assistance, Min guard, Wash/dry face, Sitting, Set up, Applying deodorant, Supervision/safety Grooming Details (indicate cue type and reason): Per clinical judgement for  standing. Sitting pt was able to wash face with s/u assist. SPV assist to open deodorant. Upper Body Bathing: Set up, Sitting Upper Body Bathing Details (indicate cue type and reason): Per clinical judgement. Lower Body Bathing: Minimal assistance, Sitting/lateral leans Lower Body Bathing Details (indicate cue type and reason): Per clinical judgement. Upper Body Dressing : Set up, Sitting Upper Body Dressing Details (indicate cue type and reason): Per clinical judgement. Lower Body Dressing: Supervision/safety, Modified independent, Sitting/lateral leans Lower Body Dressing Details (indicate cue type and reason): Extended time with labored movement for pt to doff and don R sock seated in chair this date. Toilet Transfer: RW, Stand-pivot, Minimal assistance, Moderate assistance Toilet Transfer Details (indicate cue type and reason): Simulated via EOB to chair transfer. Toileting- Architect and Hygiene: Min guard, Minimal assistance, Sit to/from stand Toileting - Clothing Manipulation Details (indicate cue type and reason): Per clinical judgement. Tub/ Shower Transfer: Minimal assistance, Moderate assistance, Rolling walker Tub/Shower Transfer Details (indicate cue type and reason): Per clinical judgement. Functional mobility during ADLs: Minimal assistance, Rolling walker, Moderate assistance (Mod A for sit to stand.) General ADL Comments: Slow labored movement with RW. Unsteady on feet.    Mobility  Overal bed mobility: Needs Assistance Bed Mobility: Supine to Sit Supine to sit: Min assist Sit to supine: Min assist General bed mobility comments: Presents up in chair    Transfers  Overall transfer level: Needs assistance Equipment used: Rolling walker (2 wheeled) Transfers: Sit to/from Stand, [employer] Transfers Sit to Stand: Min assist Stand pivot transfers: Min assist General transfer comment: increased time, labored movement    Ambulation / Gait / Stairs / Wheelchair  Mobility  Ambulation/Gait Ambulation/Gait assistance: Min guard, Editor, commissioning (Feet): 55 Feet Assistive device: Rolling walker (2 wheeled) Gait Pattern/deviations: Decreased step length - right, Decreased step length - left, Decreased stride length, Decreased dorsiflexion - left, Steppage General Gait Details: slow labored cadence with decreased left dorsiflexion with mild steppage gait on left foot, no loss of balance, limited mostly due to fatigue Gait velocity: decreased    Posture / Balance Dynamic Sitting Balance Sitting balance - Comments: seated in chair Balance Overall balance assessment: Needs assistance Sitting-balance support: Feet supported, No upper extremity supported Sitting balance-Leahy Scale: Good Sitting balance - Comments: seated in chair Standing balance support: During functional activity, Bilateral upper extremity supported Standing balance-Leahy Scale: Fair Standing balance comment: using RW    Special needs/care consideration May need Cortrak placement   Previous Home Environment  Living Arrangements:  (Lives with his parents for 2 years when moved from [place])  Lives With: Family Available  Help at Discharge: Available 24 hours/day Type of Home: House Home Layout: One level Home Access: Ramped entrance (ramp to porch then 1 step into home) Entrance Stairs-Rails: Right Entrance Stairs-Number of Steps: 3 Bathroom Shower/Tub: Engineer, manufacturing systems: Handicapped height Bathroom Accessibility: Yes Home Care Services: No  Discharge Living Setting Plans for Discharge Living Setting: Lives with (comment) (parents) Type of Home at Discharge: House Discharge Home Layout: One level Discharge Home Access: Ramped entrance (ramp to porch then 1 step in) Discharge Bathroom Shower/Tub: Tub/shower unit Discharge Bathroom Toilet: Handicapped height Discharge Bathroom Accessibility: Yes How Accessible: Accessible via walker Does the patient  have any problems obtaining your medications?: No  Social/Family/Support Systems Patient Roles: Parent (has 2 adult children in [place]) Contact Information: Dad, Greggory Stallion Anticipated Caregiver: parents Anticipated Caregiver's Contact Information: [phone] Ability/Limitations of Caregiver: parents in their late 40s Caregiver Availability: 24/7 Discharge Plan Discussed with Primary Caregiver: Yes Is Caregiver In Agreement with Plan?: Yes Does Caregiver/Family have Issues with Lodging/Transportation while Pt is in Rehab?: No  His Dad is on hemodialysis  Goals Patient/Family Goal for Rehab: Mod I to supervision with PT, OT and SLP Expected length of stay: ELOS 10 to 12 days Pt/Family Agrees to Admission and willing to participate: Yes Program Orientation Provided & Reviewed with Pt/Caregiver Including Roles  & Responsibilities: Yes  Decrease burden of Care through IP rehab admission: n/a  Possible need for SNF placement upon discharge: not anticipated  Patient Condition: I have reviewed medical records from [facility] , spoken with CM, and family member. I discussed via phone for inpatient rehabilitation assessment.  Patient will benefit from ongoing PT, OT, and SLP, can actively participate in 3 hours of therapy a day 5 days of the week, and can make measurable gains during the admission.  Patient will also benefit from the coordinated team approach during an Inpatient Acute Rehabilitation admission.  The patient will receive intensive therapy as well as Rehabilitation physician, nursing, social worker, and care management interventions.  Due to bladder management, bowel management, safety, skin/wound care, disease management, medication administration, pain management, and patient education the patient requires 24 hour a day rehabilitation nursing.  The patient is currently mod assist overall with mobility and basic ADLs.  Discharge setting and therapy post discharge at home with  home health is anticipated.  Patient has agreed to participate in the Acute Inpatient Rehabilitation Program and will admit today.  Preadmission Screen Completed By:  Clois Dupes, 05/20/2021 12:00 PM ______________________________________________________________________   Discussed status with Dr. Allena Katz  on  10/21 22 at 1201 and received approval for admission today.  Admission Coordinator:  Clois Dupes, RN, time  3845 Date  05/20/2021   Assessment/Plan: Diagnosis: CVA Does the need for close, 24 hr/day Medical supervision in concert with the patient's rehab needs make it unreasonable for this patient to be served in a less intensive setting? Yes Co-Morbidities requiring supervision/potential complications: DM type 2, HTN, HLD and previous CVA Due to safety, disease management, and patient education, does the patient require 24 hr/day rehab nursing? Yes Does the patient require coordinated care of a physician, rehab nurse, PT, OT, and SLP to address physical and functional deficits in the context of the above medical diagnosis(es)? Yes Addressing deficits in the following areas: balance, endurance, locomotion, strength, transferring, bathing, dressing, toileting, and psychosocial support Can the patient actively participate in an intensive therapy program of at least 3 hrs of therapy 5 days a week? Yes The potential for patient  to make measurable gains while on inpatient rehab is good Anticipated functional outcomes upon discharge from inpatient rehab: modified independent and supervision PT, modified independent and supervision OT, independent SLP Estimated rehab length of stay to reach the above functional goals is: 7-10 days. Anticipated discharge destination: Home 10. Overall Rehab/Functional Prognosis: excellent   MD Signature: Maryla Morrow, MD, ABPMR

## 2021-05-19 NOTE — Progress Notes (Signed)
Physical Therapy Treatment Patient Details Name: Brandon French MRN: [id number] DOB: 01/23/1966 Today's Date: 05/19/2021   History of Present Illness Brandon French  is a 55 y.o. male, with history of DMII, HTN, HLD and more presents to the ED with a chief of weakness and slurred speech.  Patient reports that the onset of symptoms was around 2:30 PM.  He later then said he actually went to sleep at 2:30 PM and woke up at 5:30 PM with slurred speech.  He reports he is never had this problem before.  He has had left upper and lower extremity weakness as well.  He has been drooling as well.  He has had trouble swallowing.  Patient has had a history of stroke and started blood pressure medication after the last stroke.  He is difficult to understand due to slurred and garbled speech, but it sounds like the left-sided weakness was present after the last stroke.  He does think it is worse now though.  Patient reports that he was otherwise in his normal state of health up until this started this afternoon.  No other complaints at this time.    PT Comments    Patient presents seated in chair (assisted by OT), agreeable and motivated for therapy.  Patient demonstrates fair/good return for completing BLE ROM/strengthening exercises with minimal verbal cueing and demonstration while seated in chair, slow labored movement for completing sit to stands using RW, slightly labored cadence with decreased left ankle dorsiflexion and limited mostly due to fatigue.  Patient tolerated staying up in chair after therapy.  Patient will benefit from continued physical therapy in hospital and recommended venue below to increase strength, balance, endurance for safe ADLs and gait.      Recommendations for follow up therapy are one component of a multi-disciplinary discharge planning process, led by the attending physician.  Recommendations may be updated based on patient status, additional functional criteria and insurance  authorization.  Follow Up Recommendations  CIR     Equipment Recommendations  None recommended by PT    Recommendations for Other Services       Precautions / Restrictions Precautions Precautions: Fall Restrictions Weight Bearing Restrictions: No     Mobility  Bed Mobility Overal bed mobility: Needs Assistance Bed Mobility: Supine to Sit     Supine to sit: Min assist     General bed mobility comments: Presents up in chair    Transfers Overall transfer level: Needs assistance Equipment used: Rolling walker (2 wheeled) Transfers: Sit to/from [employer] Sit to Stand: Min assist Stand pivot transfers: Min assist       General transfer comment: increased time, labored movement  Ambulation/Gait Ambulation/Gait assistance: Min guard;Min assist Gait Distance (Feet): 55 Feet Assistive device: Rolling walker (2 wheeled) Gait Pattern/deviations: Decreased step length - right;Decreased step length - left;Decreased stride length;Decreased dorsiflexion - left;Steppage Gait velocity: decreased   General Gait Details: slow labored cadence with decreased left dorsiflexion with mild steppage gait on left foot, no loss of balance, limited mostly due to fatigue   Stairs             Wheelchair Mobility    Modified Rankin (Stroke Patients Only)       Balance Overall balance assessment: Needs assistance Sitting-balance support: Feet supported;No upper extremity supported Sitting balance-Leahy Scale: Good Sitting balance - Comments: seated in chair   Standing balance support: During functional activity;Bilateral upper extremity supported Standing balance-Leahy Scale: Fair Standing balance comment: using RW  Cognition Arousal/Alertness: Awake/alert Behavior During Therapy: WFL for tasks assessed/performed Overall Cognitive Status: Within Functional Limits for tasks assessed                                         Exercises General Exercises - Lower Extremity Long Arc Quad: Seated;AROM;Strengthening;10 reps;Both Hip Flexion/Marching: Seated;AROM;Strengthening;Both;10 reps Toe Raises: Seated;AROM;Strengthening;Both;15 reps Heel Raises: Seated;AROM;Strengthening;15 reps;Both    General Comments        Pertinent Vitals/Pain Pain Assessment: No/denies pain    Home Living                      Prior Function            PT Goals (current goals can now be found in the care plan section) Acute Rehab PT Goals Patient Stated Goal: return home after rehab PT Goal Formulation: With patient/family Time For Goal Achievement: 06/07/21 Potential to Achieve Goals: Good Progress towards PT goals: Progressing toward goals    Frequency    Min 4X/week      PT Plan Current plan remains appropriate    Co-evaluation              AM-PAC PT "6 Clicks" Mobility   Outcome Measure  Help needed turning from your back to your side while in a flat bed without using bedrails?: None Help needed moving from lying on your back to sitting on the side of a flat bed without using bedrails?: A Little Help needed moving to and from a bed to a chair (including a wheelchair)?: A Little Help needed standing up from a chair using your arms (e.g., wheelchair or bedside chair)?: A Little Help needed to walk in hospital room?: A Little Help needed climbing 3-5 steps with a railing? : A Lot 6 Click Score: 18    End of Session   Activity Tolerance: Patient tolerated treatment well;Patient limited by fatigue Patient left: in chair;with call bell/phone within reach Nurse Communication: Mobility status PT Visit Diagnosis: Unsteadiness on feet (R26.81);Other abnormalities of gait and mobility (R26.89);Muscle weakness (generalized) (M62.81)     Time: 8185-6314 PT Time Calculation (min) (ACUTE ONLY): 27 min  Charges:  $Gait Training: 8-22 mins $Therapeutic Exercise: 8-22 mins                      10:51 AM, 05/19/21 Ocie Bob, MPT Physical Therapist with [facility] [phone] office [phone] mobile phone

## 2021-05-19 NOTE — Progress Notes (Addendum)
Inpatient Rehabilitation Admissions Coordinator  I spoke with patient's Dad by phone at his bedside. We discussed goals and expectations of a possible CIR admit. They prefer Cir at [facility] prior to return home with his parents. We will begin Auth with [facility] for a possible Admit pending insurance approval. He is a great candidate for Cir. He does not need PEG to admit to CIR, but needs to be able to take po meds and diet or Cortrak needed. Noted MBS results.  Ottie Glazier, RN, MSN Rehab Admissions Coordinator [phone] 05/19/2021 6:15 PM

## 2021-05-19 NOTE — Progress Notes (Signed)
Occupational Therapy Treatment Patient Details Name: Brandon French MRN: [id number] DOB: 1965/09/29 Today's Date: 05/19/2021   History of present illness Brandon French  is a 55 y.o. male, with history of DMII, HTN, HLD and more presents to the ED with a chief of weakness and slurred speech.  Patient reports that the onset of symptoms was around 2:30 PM.  He later then said he actually went to sleep at 2:30 PM and woke up at 5:30 PM with slurred speech.  He reports he is never had this problem before.  He has had left upper and lower extremity weakness as well.  He has been drooling as well.  He has had trouble swallowing.  Patient has had a history of stroke and started blood pressure medication after the last stroke.  He is difficult to understand due to slurred and garbled speech, but it sounds like the left-sided weakness was present after the last stroke.  He does think it is worse now though.  Patient reports that he was otherwise in his normal state of health up until this started this afternoon.  No other complaints at this time.   OT comments  Pt sleeping when this OT entered the room. Pt woke easily and sat at EOB with Min A. Pt required Moderate assist for sit to stand from EOB today with mild impulsive movement during bed mobility. Pt demonstrated improved sit to stand from chair using seat arms to push up from chair. Pt was able to doff and don R sock but with extended time and labored movement. Pt was able to complete grooming tasks seated in the chair with set up to supervision assist. Pt was able to walk forward and backward from chair using RW with min A today. Overall pt is unsteady with use of RW. Pt will benefit from continued OT in the hospital and recommended venue below to increase strength, balance, and endurance for safe ADL's.      Recommendations for follow up therapy are one component of a multi-disciplinary discharge planning process, led by the attending physician.   Recommendations may be updated based on patient status, additional functional criteria and insurance authorization.    Follow Up Recommendations  CIR    Equipment Recommendations  Tub/shower seat          Precautions / Restrictions Precautions Precautions: Fall Restrictions Weight Bearing Restrictions: No       Mobility Bed Mobility Overal bed mobility: Needs Assistance Bed Mobility: Supine to Sit     Supine to sit: Min assist     General bed mobility comments: Single hand held assist to raise from supine to sit with HOB slightly elevated. Slow labored movement.    Transfers Overall transfer level: Needs assistance Equipment used: Rolling walker (2 wheeled) Transfers: Sit to/from [employer] Sit to Stand: Mod assist Stand pivot transfers: Min assist       General transfer comment: Slow, labored, and mild impulsive movement during transfer with RW. More assist needed for sit to stand from EOB.    Balance Overall balance assessment: Needs assistance Sitting-balance support: Feet supported;No upper extremity supported Sitting balance-Leahy Scale: Good Sitting balance - Comments: seated at EOB   Standing balance support: Single extremity supported;Bilateral upper extremity supported Standing balance-Leahy Scale: Poor Standing balance comment: using RW poor to fair                           ADL either performed or  assessed with clinical judgement   ADL Overall ADL's : Needs assistance/impaired Eating/Feeding:  (NG tube.)   Grooming: Standing;Minimal assistance;Min guard;Wash/dry face;Sitting;Set up;Applying deodorant;Supervision/safety Grooming Details (indicate cue type and reason): Per clinical judgement for standing. Sitting pt was able to wash face with s/u assist. SPV assist to open deodorant. Upper Body Bathing: Set up;Sitting Upper Body Bathing Details (indicate cue type and reason): Per clinical judgement. Lower Body Bathing:  Minimal assistance;Sitting/lateral leans Lower Body Bathing Details (indicate cue type and reason): Per clinical judgement. Upper Body Dressing : Set up;Sitting Upper Body Dressing Details (indicate cue type and reason): Per clinical judgement. Lower Body Dressing: Supervision/safety;Modified independent;Sitting/lateral leans Lower Body Dressing Details (indicate cue type and reason): Extended time with labored movement for pt to doff and don R sock seated in chair this date. Toilet Transfer: RW;Stand-pivot;Minimal assistance;Moderate assistance Toilet Transfer Details (indicate cue type and reason): Simulated via EOB to chair transfer. Toileting- Architect and Hygiene: Min guard;Minimal assistance;Sit to/from stand Toileting - Clothing Manipulation Details (indicate cue type and reason): Per clinical judgement. Tub/ Shower Transfer: Minimal assistance;Moderate assistance;Rolling walker Tub/Shower Transfer Details (indicate cue type and reason): Per clinical judgement. Functional mobility during ADLs: Minimal assistance;Rolling walker;Moderate assistance (Mod A for sit to stand.) General ADL Comments: Slow labored movement with RW. Unsteady on feet.                       Cognition Arousal/Alertness: Awake/alert Behavior During Therapy: WFL for tasks assessed/performed Overall Cognitive Status: Within Functional Limits for tasks assessed                                                            Pertinent Vitals/ Pain       Pain Assessment: No/denies pain                                                          Frequency  Min 2X/week        Progress Toward Goals  OT Goals(current goals can now be found in the care plan section)  Progress towards OT goals: Progressing toward goals  Acute Rehab OT Goals Patient Stated Goal: return home after rehab OT Goal Formulation: With patient Time For Goal Achievement:  05/31/21 Potential to Achieve Goals: Good ADL Goals Pt Will Perform Grooming: with modified independence;standing Pt Will Perform Lower Body Dressing: with modified independence;sitting/lateral leans;sit to/from stand Pt Will Transfer to Toilet: with modified independence;ambulating Pt/caregiver will Perform Home Exercise Program: Increased ROM;Increased strength;Left upper extremity;Independently  Plan Discharge plan needs to be updated                                    End of Session Equipment Utilized During Treatment: Rolling walker  OT Visit Diagnosis: Other abnormalities of gait and mobility (R26.89);Unsteadiness on feet (R26.81);Muscle weakness (generalized) (M62.81);Other symptoms and signs involving the nervous system (R29.898);Hemiplegia and hemiparesis Hemiplegia - Right/Left: Left Hemiplegia - dominant/non-dominant: Non-Dominant Hemiplegia - caused by: Cerebral infarction   Activity Tolerance Patient tolerated treatment well  Patient Left in chair;with call bell/phone within reach   Nurse Communication Other (comment) (Notifed pt in chair)        Time: 6767-2094 OT Time Calculation (min): 18 min  Charges: OT General Charges $OT Visit: 1 Visit OT Treatments $Self Care/Home Management : 8-22 mins  Brandon French OT, MOT  Danie Chandler 05/19/2021, 9:57 AM

## 2021-05-20 ENCOUNTER — Encounter (HOSPITAL_COMMUNITY): Payer: Self-pay | Admitting: Physical Medicine & Rehabilitation

## 2021-05-20 ENCOUNTER — Encounter (HOSPITAL_COMMUNITY): Payer: Self-pay | Admitting: Anesthesiology

## 2021-05-20 ENCOUNTER — Inpatient Hospital Stay (HOSPITAL_COMMUNITY)
Admission: RE | Admit: 2021-05-20 | Discharge: 2021-06-07 | DRG: 057 | Disposition: A | Discharge status: Home Health | Payer: Medicare HMO | Source: Intra-hospital | Attending: Physical Medicine & Rehabilitation | Admitting: Physical Medicine & Rehabilitation

## 2021-05-20 ENCOUNTER — Encounter (HOSPITAL_COMMUNITY): Payer: Self-pay | Admitting: Internal Medicine

## 2021-05-20 ENCOUNTER — Inpatient Hospital Stay (HOSPITAL_COMMUNITY): Payer: Medicare HMO

## 2021-05-20 ENCOUNTER — Other Ambulatory Visit: Payer: Self-pay

## 2021-05-20 DIAGNOSIS — E785 Hyperlipidemia, unspecified: Secondary | ICD-10-CM

## 2021-05-20 DIAGNOSIS — R52 Pain, unspecified: Secondary | ICD-10-CM

## 2021-05-20 DIAGNOSIS — N400 Enlarged prostate without lower urinary tract symptoms: Secondary | ICD-10-CM | POA: Diagnosis present

## 2021-05-20 DIAGNOSIS — I639 Cerebral infarction, unspecified: Secondary | ICD-10-CM

## 2021-05-20 DIAGNOSIS — E1169 Type 2 diabetes mellitus with other specified complication: Secondary | ICD-10-CM | POA: Diagnosis not present

## 2021-05-20 DIAGNOSIS — I69354 Hemiplegia and hemiparesis following cerebral infarction affecting left non-dominant side: Principal | ICD-10-CM

## 2021-05-20 DIAGNOSIS — E7800 Pure hypercholesterolemia, unspecified: Secondary | ICD-10-CM | POA: Diagnosis present

## 2021-05-20 DIAGNOSIS — I6389 Other cerebral infarction: Secondary | ICD-10-CM | POA: Diagnosis not present

## 2021-05-20 DIAGNOSIS — R1313 Dysphagia, pharyngeal phase: Secondary | ICD-10-CM | POA: Diagnosis present

## 2021-05-20 DIAGNOSIS — Z886 Allergy status to analgesic agent status: Secondary | ICD-10-CM

## 2021-05-20 DIAGNOSIS — Z87891 Personal history of nicotine dependence: Secondary | ICD-10-CM

## 2021-05-20 DIAGNOSIS — Z79899 Other long term (current) drug therapy: Secondary | ICD-10-CM

## 2021-05-20 DIAGNOSIS — E119 Type 2 diabetes mellitus without complications: Secondary | ICD-10-CM

## 2021-05-20 DIAGNOSIS — I421 Obstructive hypertrophic cardiomyopathy: Secondary | ICD-10-CM | POA: Diagnosis not present

## 2021-05-20 DIAGNOSIS — Z7984 Long term (current) use of oral hypoglycemic drugs: Secondary | ICD-10-CM

## 2021-05-20 DIAGNOSIS — I10 Essential (primary) hypertension: Secondary | ICD-10-CM | POA: Diagnosis not present

## 2021-05-20 DIAGNOSIS — Z833 Family history of diabetes mellitus: Secondary | ICD-10-CM

## 2021-05-20 DIAGNOSIS — Z6841 Body Mass Index (BMI) 40.0 and over, adult: Secondary | ICD-10-CM | POA: Diagnosis not present

## 2021-05-20 DIAGNOSIS — E1122 Type 2 diabetes mellitus with diabetic chronic kidney disease: Secondary | ICD-10-CM | POA: Diagnosis present

## 2021-05-20 DIAGNOSIS — I129 Hypertensive chronic kidney disease with stage 1 through stage 4 chronic kidney disease, or unspecified chronic kidney disease: Secondary | ICD-10-CM | POA: Diagnosis not present

## 2021-05-20 DIAGNOSIS — N1832 Chronic kidney disease, stage 3b: Secondary | ICD-10-CM | POA: Diagnosis not present

## 2021-05-20 DIAGNOSIS — M79674 Pain in right toe(s): Secondary | ICD-10-CM | POA: Diagnosis not present

## 2021-05-20 DIAGNOSIS — I69391 Dysphagia following cerebral infarction: Secondary | ICD-10-CM

## 2021-05-20 DIAGNOSIS — I63011 Cerebral infarction due to thrombosis of right vertebral artery: Secondary | ICD-10-CM | POA: Diagnosis not present

## 2021-05-20 DIAGNOSIS — I69322 Dysarthria following cerebral infarction: Secondary | ICD-10-CM | POA: Diagnosis not present

## 2021-05-20 DIAGNOSIS — R4781 Slurred speech: Secondary | ICD-10-CM | POA: Diagnosis present

## 2021-05-20 DIAGNOSIS — M109 Gout, unspecified: Secondary | ICD-10-CM | POA: Diagnosis not present

## 2021-05-20 DIAGNOSIS — R001 Bradycardia, unspecified: Secondary | ICD-10-CM | POA: Diagnosis not present

## 2021-05-20 DIAGNOSIS — R062 Wheezing: Secondary | ICD-10-CM

## 2021-05-20 DIAGNOSIS — M7989 Other specified soft tissue disorders: Secondary | ICD-10-CM | POA: Diagnosis not present

## 2021-05-20 DIAGNOSIS — Z809 Family history of malignant neoplasm, unspecified: Secondary | ICD-10-CM | POA: Diagnosis not present

## 2021-05-20 DIAGNOSIS — I422 Other hypertrophic cardiomyopathy: Secondary | ICD-10-CM

## 2021-05-20 DIAGNOSIS — I63219 Cerebral infarction due to unspecified occlusion or stenosis of unspecified vertebral arteries: Secondary | ICD-10-CM | POA: Diagnosis not present

## 2021-05-20 DIAGNOSIS — E669 Obesity, unspecified: Secondary | ICD-10-CM | POA: Diagnosis not present

## 2021-05-20 LAB — GLUCOSE, CAPILLARY
Glucose-Capillary: 86 mg/dL (ref 70–99)
Glucose-Capillary: 112 mg/dL — ABNORMAL HIGH (ref 70–99)
Glucose-Capillary: 106 mg/dL — ABNORMAL HIGH (ref 70–99)

## 2021-05-20 IMAGING — DX DG CHEST 2V
2 series · 2 of 2 positions shown · non-contrast
Comparison: None.

CLINICAL DATA: Wheezing

EXAM:
CHEST - 2 VIEW

[chest lat]
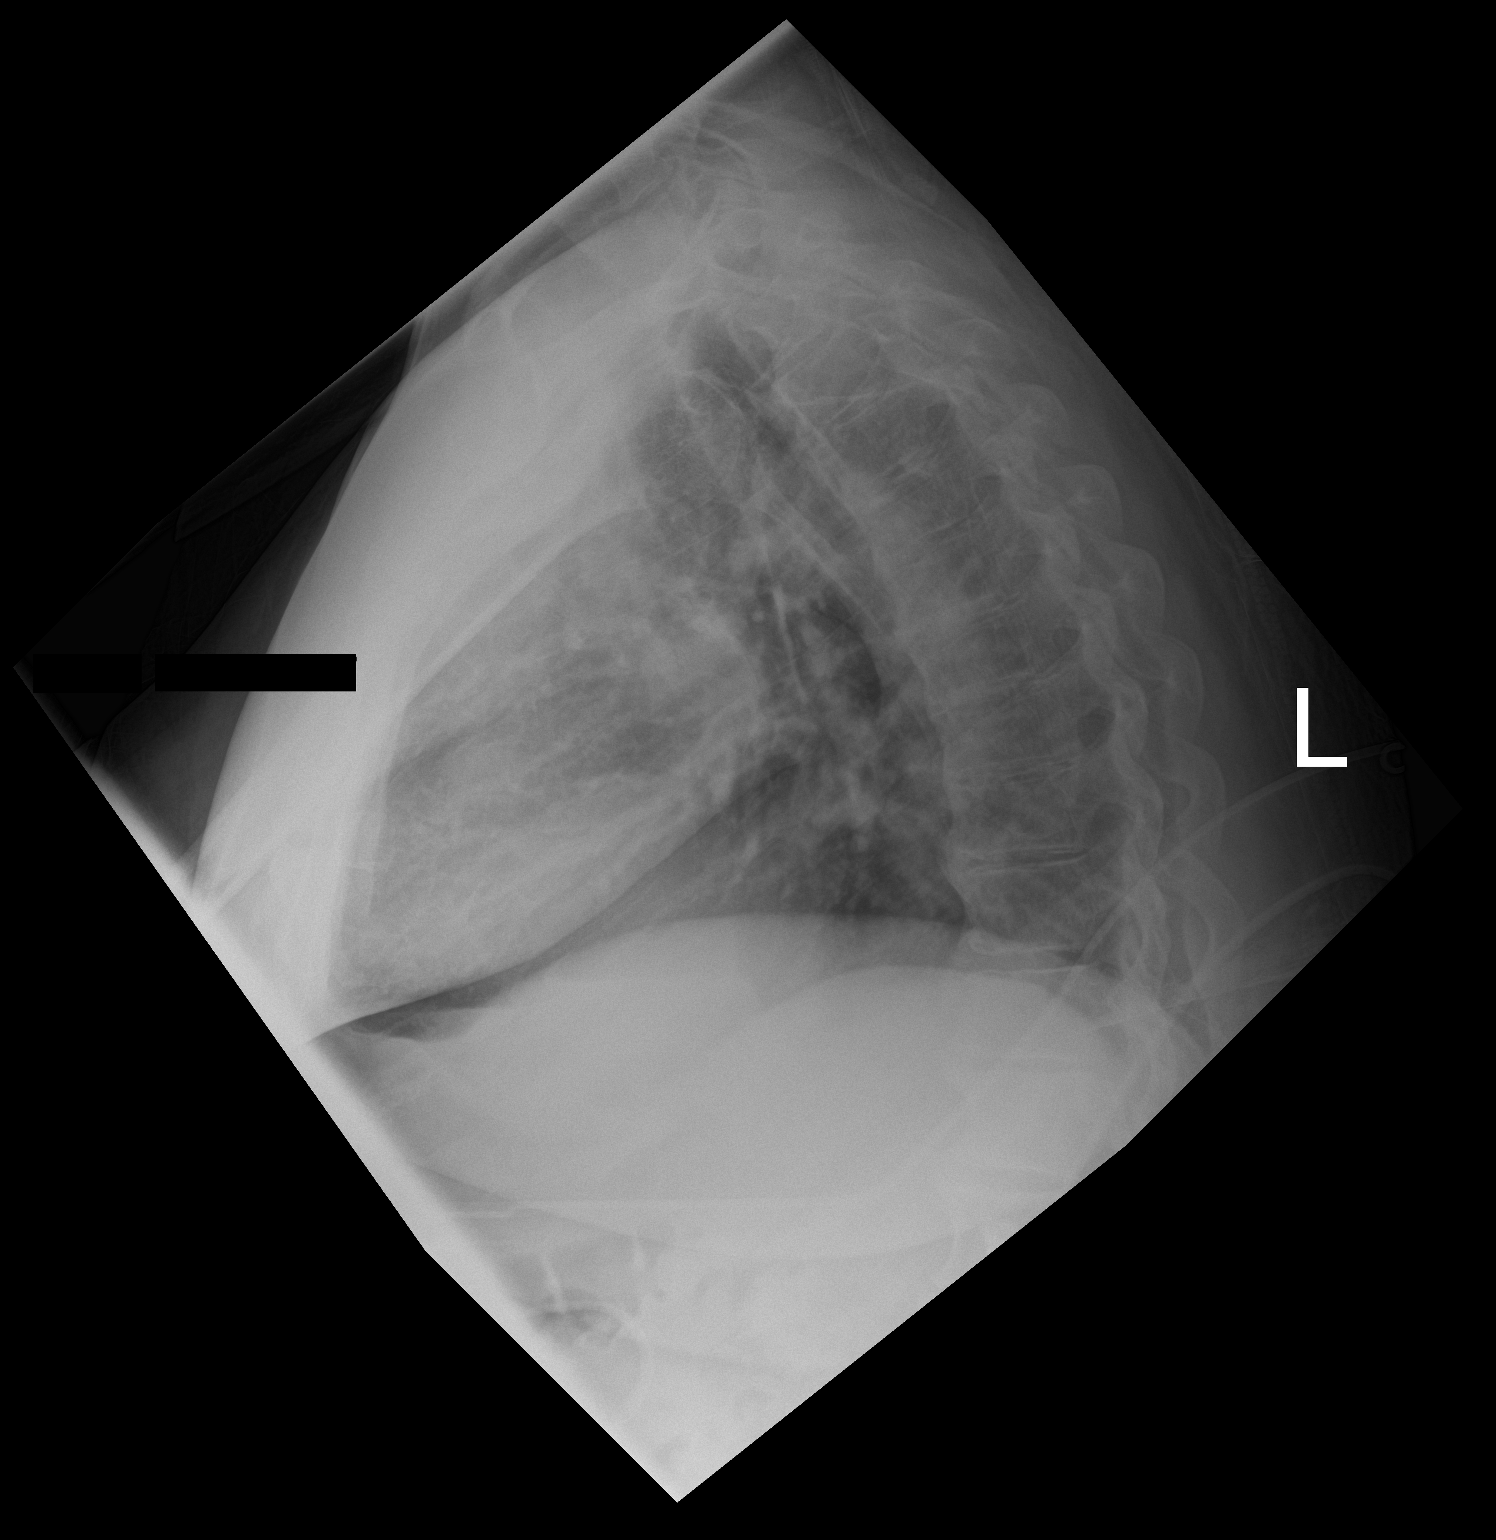

[chest ap]
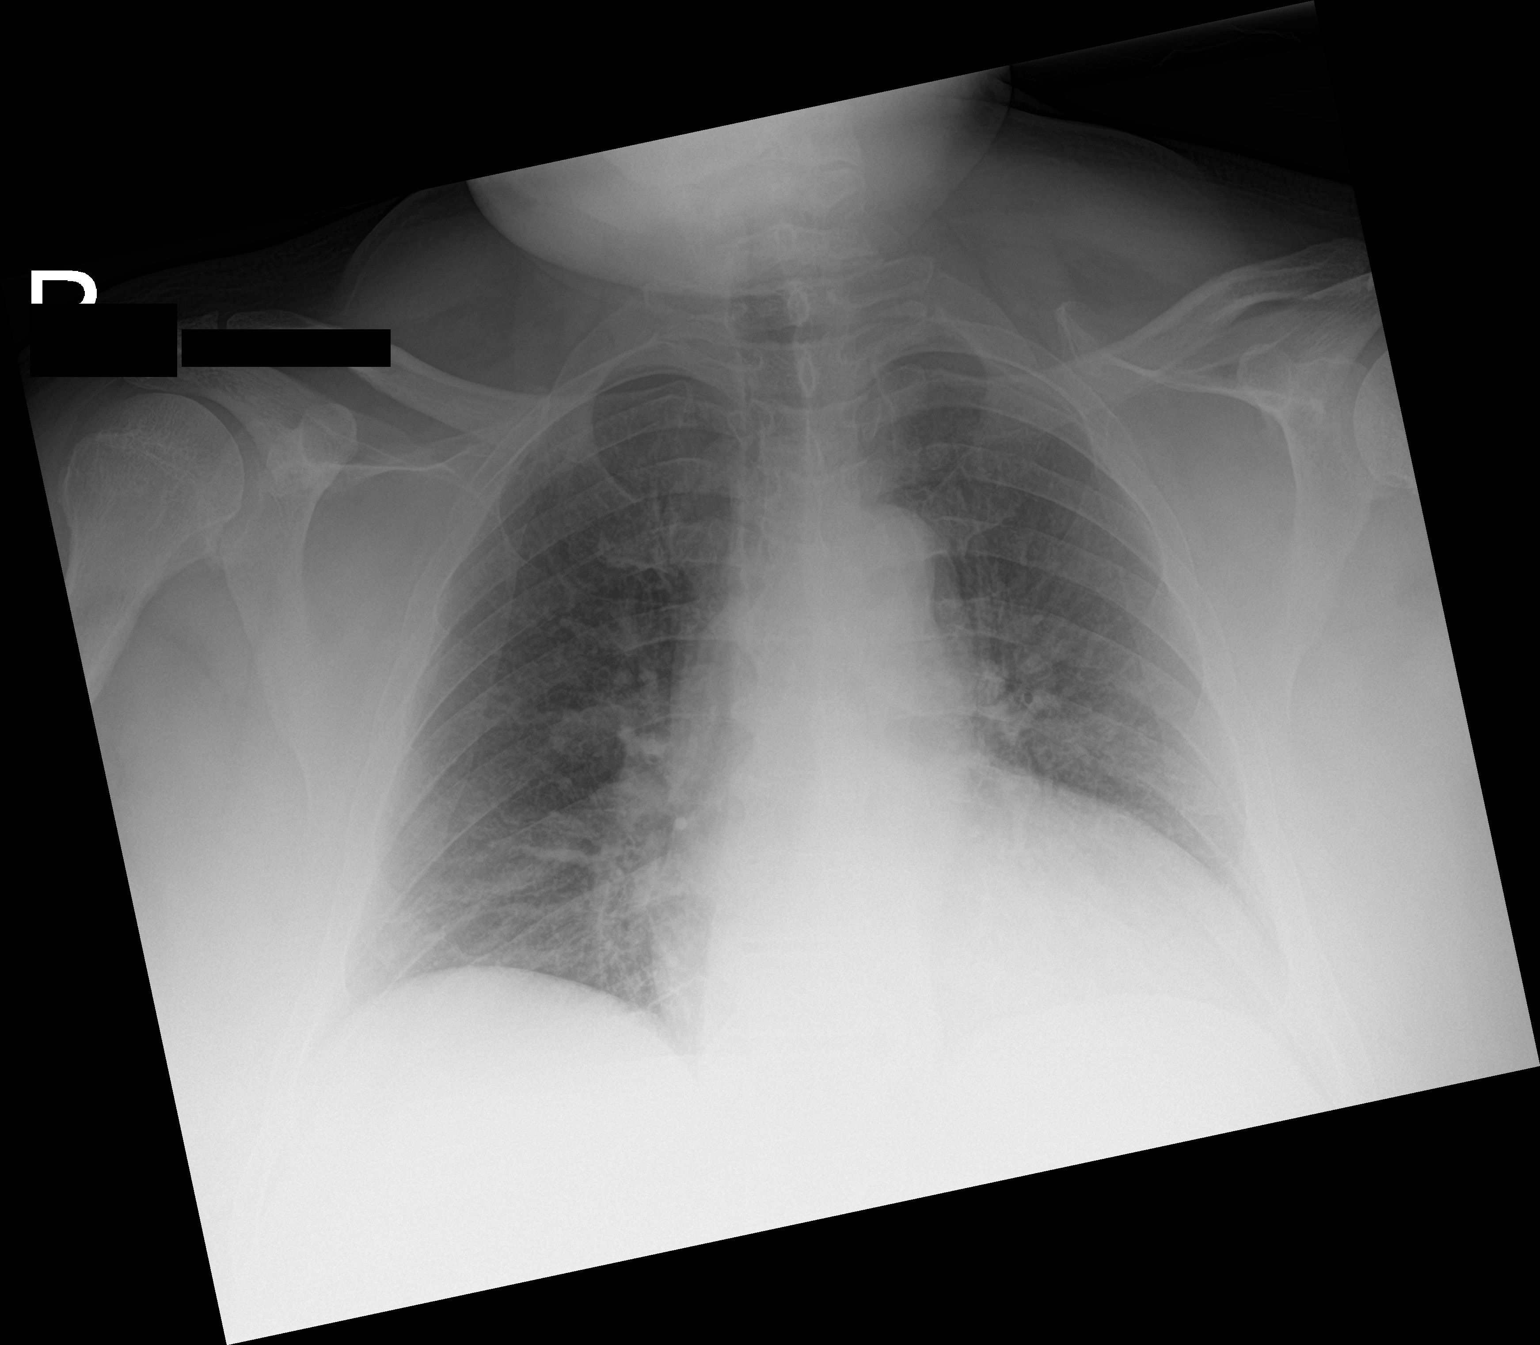

[2 of 2 positions shown; findings below may reference images not displayed]

FINDINGS: Cardiomegaly with vascular congestion. Possible bronchitic changes.
No consolidation, pleural effusion or pneumothorax
IMPRESSION: 1. Cardiomegaly with slight central congestion
2. Mild bronchitic changes

## 2021-05-20 MED ORDER — PROCHLORPERAZINE EDISYLATE 10 MG/2ML IJ SOLN: 5.0000 mg | Freq: Four times a day (QID) | INTRAMUSCULAR | Status: DC | PRN

## 2021-05-20 MED ORDER — INSULIN ASPART 100 UNIT/ML IJ SOLN
0.0000 [IU] | Freq: Three times a day (TID) | INTRAMUSCULAR | Status: DC
  Administered 2021-05-21: 1 [IU] via SUBCUTANEOUS
  Administered 2021-05-22: 2 [IU] via SUBCUTANEOUS
  Administered 2021-05-23 – 2021-05-28 (×5): 1 [IU] via SUBCUTANEOUS
  Administered 2021-05-29: 2 [IU] via SUBCUTANEOUS
  Administered 2021-05-30 (×2): 1 [IU] via SUBCUTANEOUS
  Administered 2021-05-31: 2 [IU] via SUBCUTANEOUS
  Administered 2021-06-01: 1 [IU] via SUBCUTANEOUS
  Administered 2021-06-01 – 2021-06-02 (×2): 2 [IU] via SUBCUTANEOUS
  Administered 2021-06-02: 1 [IU] via SUBCUTANEOUS
  Administered 2021-06-02 – 2021-06-04 (×4): 2 [IU] via SUBCUTANEOUS
  Administered 2021-06-04: 1 [IU] via SUBCUTANEOUS
  Administered 2021-06-05: 2 [IU] via SUBCUTANEOUS
  Administered 2021-06-05: 3 [IU] via SUBCUTANEOUS
  Administered 2021-06-06 (×2): 2 [IU] via SUBCUTANEOUS

## 2021-05-20 MED ORDER — AMLODIPINE BESYLATE 5 MG PO TABS
5.0000 mg | ORAL_TABLET | Freq: Every day | ORAL | Status: DC
  Administered 2021-05-21: 5 mg via ORAL
  Filled 2021-05-20: qty 1

## 2021-05-20 MED ORDER — ACETAMINOPHEN 325 MG PO TABS: 650.0000 mg | ORAL_TABLET | ORAL | 0 refills | Status: DC | PRN

## 2021-05-20 MED ORDER — LIDOCAINE HCL URETHRAL/MUCOSAL 2 % EX GEL: CUTANEOUS | Status: DC | PRN

## 2021-05-20 MED ORDER — FINASTERIDE 5 MG PO TABS: 5.0000 mg | ORAL_TABLET | Freq: Every day | ORAL | 3 refills | Status: DC

## 2021-05-20 MED ORDER — TRAZODONE HCL 50 MG PO TABS: 25.0000 mg | ORAL_TABLET | Freq: Every evening | ORAL | Status: DC | PRN

## 2021-05-20 MED ORDER — FINASTERIDE 5 MG PO TABS
5.0000 mg | ORAL_TABLET | Freq: Every day | ORAL | Status: DC
  Administered 2021-05-21 – 2021-06-07 (×18): 5 mg via ORAL
  Filled 2021-05-20 (×19): qty 1

## 2021-05-20 MED ORDER — LIVING WELL WITH DIABETES BOOK
Freq: Once | Status: AC
  Administered 2021-05-20: 1
  Filled 2021-05-20: qty 1

## 2021-05-20 MED ORDER — ACETAMINOPHEN 325 MG PO TABS
325.0000 mg | ORAL_TABLET | ORAL | Status: DC | PRN
  Administered 2021-05-20 – 2021-05-31 (×5): 650 mg via ORAL
  Filled 2021-05-20 (×5): qty 2

## 2021-05-20 MED ORDER — GLIMEPIRIDE 2 MG PO TABS: 2.0000 mg | ORAL_TABLET | Freq: Every day | ORAL | 11 refills | Status: DC

## 2021-05-20 MED ORDER — SENNOSIDES-DOCUSATE SODIUM 8.6-50 MG PO TABS
2.0000 | ORAL_TABLET | Freq: Every evening | ORAL | Status: DC | PRN
  Administered 2021-06-02: 2 via ORAL
  Filled 2021-05-20: qty 2

## 2021-05-20 MED ORDER — AMLODIPINE BESYLATE 5 MG PO TABS: 5.0000 mg | ORAL_TABLET | Freq: Every day | ORAL | 3 refills | Status: DC

## 2021-05-20 MED ORDER — BLOOD PRESSURE CONTROL BOOK
Freq: Once | Status: AC
  Filled 2021-05-20: qty 1

## 2021-05-20 MED ORDER — ASPIRIN 81 MG PO TBEC: 81.0000 mg | DELAYED_RELEASE_TABLET | Freq: Every day | ORAL | 11 refills | Status: DC

## 2021-05-20 MED ORDER — PROCHLORPERAZINE MALEATE 5 MG PO TABS: 5.0000 mg | ORAL_TABLET | Freq: Four times a day (QID) | ORAL | Status: DC | PRN

## 2021-05-20 MED ORDER — CLOPIDOGREL BISULFATE 75 MG PO TABS
75.0000 mg | ORAL_TABLET | Freq: Every day | ORAL | Status: DC
  Administered 2021-05-21 – 2021-05-23 (×3): 75 mg via NASOGASTRIC
  Filled 2021-05-20 (×3): qty 1

## 2021-05-20 MED ORDER — ENOXAPARIN SODIUM 80 MG/0.8ML IJ SOSY
70.0000 mg | PREFILLED_SYRINGE | INTRAMUSCULAR | Status: DC
  Administered 2021-05-20 – 2021-06-06 (×18): 70 mg via SUBCUTANEOUS
  Filled 2021-05-20 (×2): qty 0.8
  Filled 2021-05-20 (×2): qty 0.7
  Filled 2021-05-20 (×5): qty 0.8
  Filled 2021-05-20 (×2): qty 0.7
  Filled 2021-05-20 (×3): qty 0.8
  Filled 2021-05-20 (×2): qty 0.7
  Filled 2021-05-20 (×2): qty 0.8

## 2021-05-20 MED ORDER — SODIUM CHLORIDE 0.9 % IV SOLN: INTRAVENOUS | Status: DC

## 2021-05-20 MED ORDER — FLEET ENEMA 7-19 GM/118ML RE ENEM: 1.0000 | ENEMA | Freq: Once | RECTAL | Status: DC | PRN

## 2021-05-20 MED ORDER — ENSURE ENLIVE PO LIQD
237.0000 mL | Freq: Two times a day (BID) | ORAL | Status: DC
  Administered 2021-05-20 – 2021-05-21 (×3): 237 mL via ORAL

## 2021-05-20 MED ORDER — CLOPIDOGREL BISULFATE 75 MG PO TABS: 75.0000 mg | ORAL_TABLET | Freq: Every day | ORAL | 3 refills | Status: DC

## 2021-05-20 MED ORDER — ASPIRIN EC 81 MG PO TBEC
81.0000 mg | DELAYED_RELEASE_TABLET | Freq: Every day | ORAL | Status: DC
  Administered 2021-05-21 – 2021-06-07 (×18): 81 mg via ORAL
  Filled 2021-05-20 (×18): qty 1

## 2021-05-20 MED ORDER — PANTOPRAZOLE SODIUM 40 MG PO TBEC
40.0000 mg | DELAYED_RELEASE_TABLET | Freq: Every day | ORAL | Status: DC
  Administered 2021-05-21 – 2021-06-07 (×18): 40 mg via ORAL
  Filled 2021-05-20 (×18): qty 1

## 2021-05-20 MED ORDER — ENOXAPARIN SODIUM 40 MG/0.4ML IJ SOSY: 40.0000 mg | PREFILLED_SYRINGE | INTRAMUSCULAR | Status: DC

## 2021-05-20 MED ORDER — PROCHLORPERAZINE 25 MG RE SUPP: 12.5000 mg | Freq: Four times a day (QID) | RECTAL | Status: DC | PRN

## 2021-05-20 MED ORDER — DIPHENHYDRAMINE HCL 12.5 MG/5ML PO ELIX: 12.5000 mg | ORAL_SOLUTION | Freq: Four times a day (QID) | ORAL | Status: DC | PRN

## 2021-05-20 MED ORDER — BISACODYL 10 MG RE SUPP
10.0000 mg | Freq: Every day | RECTAL | Status: DC | PRN
  Filled 2021-05-20: qty 1

## 2021-05-20 MED ORDER — ATORVASTATIN CALCIUM 40 MG PO TABS
40.0000 mg | ORAL_TABLET | Freq: Every day | ORAL | Status: DC
  Administered 2021-05-21 – 2021-06-07 (×18): 40 mg via ORAL
  Filled 2021-05-20 (×18): qty 1

## 2021-05-20 MED ORDER — ATORVASTATIN CALCIUM 40 MG PO TABS: 40.0000 mg | ORAL_TABLET | Freq: Every day | ORAL | 3 refills | Status: DC

## 2021-05-20 MED ORDER — ALUM & MAG HYDROXIDE-SIMETH 200-200-20 MG/5ML PO SUSP: 30.0000 mL | ORAL | Status: DC | PRN

## 2021-05-20 MED ORDER — METOPROLOL SUCCINATE ER 100 MG PO TB24: 100.0000 mg | ORAL_TABLET | Freq: Every day | ORAL | 3 refills | Status: DC

## 2021-05-20 MED ORDER — GUAIFENESIN-DM 100-10 MG/5ML PO SYRP: 5.0000 mL | ORAL_SOLUTION | Freq: Four times a day (QID) | ORAL | Status: DC | PRN

## 2021-05-20 MED ORDER — TAMSULOSIN HCL 0.4 MG PO CAPS: 0.4000 mg | ORAL_CAPSULE | Freq: Every day | ORAL | 3 refills | Status: DC

## 2021-05-20 MED ORDER — TAMSULOSIN HCL 0.4 MG PO CAPS
0.4000 mg | ORAL_CAPSULE | Freq: Every day | ORAL | Status: DC
  Administered 2021-05-20 – 2021-06-06 (×14): 0.4 mg via ORAL
  Filled 2021-05-20 (×18): qty 1

## 2021-05-20 MED ORDER — TRAZODONE HCL 50 MG PO TABS
50.0000 mg | ORAL_TABLET | Freq: Every day | ORAL | Status: DC
  Administered 2021-05-20 – 2021-06-06 (×18): 50 mg via ORAL
  Filled 2021-05-20 (×18): qty 1

## 2021-05-20 MED ORDER — INSULIN ASPART 100 UNIT/ML IJ SOLN
0.0000 [IU] | Freq: Every day | INTRAMUSCULAR | Status: DC
Start: 2021-05-20 — End: 2021-06-07
  Administered 2021-06-01 – 2021-06-05 (×4): 2 [IU] via SUBCUTANEOUS

## 2021-05-20 MED ORDER — PANTOPRAZOLE SODIUM 40 MG PO TBEC: 40.0000 mg | DELAYED_RELEASE_TABLET | Freq: Every day | ORAL | 2 refills | Status: DC

## 2021-05-20 MED ORDER — CLONIDINE HCL 0.1 MG PO TABS: 0.1000 mg | ORAL_TABLET | Freq: Two times a day (BID) | ORAL | 11 refills | Status: DC

## 2021-05-20 MED ORDER — ALBUTEROL SULFATE (2.5 MG/3ML) 0.083% IN NEBU: 2.5000 mg | INHALATION_SOLUTION | RESPIRATORY_TRACT | Status: DC | PRN

## 2021-05-20 MED ORDER — POTASSIUM CHLORIDE IN NACL 20-0.9 MEQ/L-% IV SOLN
INTRAVENOUS | Status: AC
Start: 2021-05-20 — End: 2021-05-21
  Filled 2021-05-20: qty 1000

## 2021-05-20 NOTE — TOC Transition Note (Signed)
Transition of Care [facility]) - CM/SW Discharge Note   Patient Details  Name: Brandon French MRN: [id number] Date of Birth: July 21, 1966  [facility] of [facility]) CM/SW Contact:  Leitha Bleak, RN Phone Number: 05/20/2021, 12:08 PM   Clinical Narrative:   Patient approved for CIR. Discharge there today. CIR called [facility] and updated RN with DC plan.    Final next level of care: Home w Home Health Services Barriers to Discharge: Barriers Resolved (going to CIR)   Patient Goals and CMS Choice Patient states their goals for this hospitalization and ongoing recovery are:: Return home with [facility] CMS Medicare.gov Compare Post Acute Care list provided to:: Patient Choice offered to / list presented to : Patient  Discharge Placement  Discharge Plan and Services In-house Referral: Clinical Social Work Discharge Planning Services: CM Consult Post Acute Care Choice: Home Health              Readmission Risk Interventions Readmission Risk Prevention Plan 05/17/2021  Medication Screening Complete  Transportation Screening Complete

## 2021-05-20 NOTE — Progress Notes (Signed)
Initial Nutrition Assessment  DOCUMENTATION CODES:   Morbid obesity  INTERVENTION:  Provide Magic cup TID with meals, each supplement provides 290 kcal and 9 grams of protein  Provide Ensure Enlive po BID (thickened to appropriate consistency), each supplement provides 350 kcal and 20 grams of protein  Encourage PO intake.  NUTRITION DIAGNOSIS:   Increased nutrient needs related to  (therapy) as evidenced by estimated needs.  GOAL:   Patient will meet greater than or equal to 90% of their needs  MONITOR:  PO intake, Supplement acceptance, Skin, Weight trends, Labs, I & O's  REASON FOR ASSESSMENT:   Consult Assessment of nutrition requirement/status  ASSESSMENT:   55 year old male with history of DM type 2, HTN, HLD and previous CVA. Presented on 05/16/2021 with weakness and slurred speech. 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  Pt unavailable during attempted time of contact. Pt is currently on a dysphagia 1 diet with honey thick liquids. Meal completion 50-75%. RD to order nutritional supplements to aid in caloric and protein needs. Unable to complete Nutrition-Focused physical exam at this time.   Labs and medications reviewed.   Diet Order:   Diet Order             DIET - DYS 1 Room service appropriate? Yes; Fluid consistency: Honey Thick  Diet effective now                   EDUCATION NEEDS:   Not appropriate for education at this time  Skin:  Skin Assessment: Reviewed RN Assessment  Last BM:  10/16  Height:   Ht Readings from Last 1 Encounters:  05/20/21 6' (1.829 m)    Weight:   Wt Readings from Last 1 Encounters:  05/20/21 (!) 147.6 kg   BMI:  Body mass index is 44.13 kg/m.  Estimated Nutritional Needs:   Kcal:  2100-2300  Protein:   105-115 grams  Fluid:  >/= 2 L/day  Roslyn Smiling, MS, RD, LDN RD pager number/after hours weekend pager number on Amion.

## 2021-05-20 NOTE — H&P (Signed)
Physical Medicine and Rehabilitation Admission H&P    Chief Complaint  Patient presents with   Stroke with functional deficits.     HPI: Brandon French is a 55 year old RH-male with history of HTN, T2DM, BPH?, who was admitted to [facility] on 05/16/2021 after waking up with slurred speech and left hemiparesis. He was drooling and reported to have trouble swallowing. 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  Echocardiogram with EF of EF 65-70% and sevvere asymmetric left ventricular hypertrophy of septal segment--cardiology referral recommended.  Dr. Gerilyn Pilgrim consulted for input and recommended aspirin/Plavix for 1 month followed by Plavix alone.  NPO. recommended due to severe dysphagia with inability to handle secretions.  MBS done on showing severe oral effusion mild to moderate pharyngeal phase dysphagia with penetration and aspiration of thin and nectar and honey liquids.  He was able to tolerate honey thick by teaspoon and needed verbal cues to try and engage on phase.  PEG tube was recommended due to concerns of ability to maintain adequate nutrition.  Therapy ongoing and patient limited by left-sided hemiparesis with balance deficits and fatigue.  CIR was recommended due to functional decline. Please see preadmission assessment from earlier today as well.   Review of Systems  Constitutional:  Negative for chills, diaphoresis, fever and malaise/fatigue.  Eyes:        Wear glasses for driving  Respiratory:  Positive for wheezing. Negative  for cough, shortness of breath and stridor.   Cardiovascular:  Negative for chest pain and leg swelling.  Gastrointestinal:  Negative for constipation and heartburn.  Genitourinary:  Negative for dysuria and urgency.  Musculoskeletal:  Positive for myalgias (left thigh/calf pain). Negative for back pain.  Neurological:  Positive for speech change, focal weakness and weakness. Negative for dizziness.  Psychiatric/Behavioral:  The patient has insomnia (chronic).   All other systems reviewed and are negative.   Past Medical History:  Diagnosis Date   Diabetes mellitus (HCC)    Elevated cholesterol    High blood pressure     No past surgical history.    Family History  Problem Relation Age of Onset   Diabetes Mother    Cancer Mother    Diabetes Father     Social History: Lives with parents. He  reports that he used to smoke cigarettes--quit 10 years ago.  He has never used smokeless tobacco. He reports that he does not drink alcohol and does not use drugs.   Allergies  Allergen Reactions   Aleve [Naproxen]    Medications Prior to Admission  Medication Sig Dispense Refill   acetaminophen (TYLENOL) 325 MG tablet Take 2 tablets (650 mg total) by mouth every 4 (four) hours as needed for mild pain (or temp > 37.5 C (99.5 F)). 30 tablet 0   [START ON 05/21/2021] amLODipine (NORVASC) 5 MG tablet Take 1 tablet (5 mg total) by mouth daily. 90 tablet 3   [START ON 05/21/2021] aspirin EC 81 MG EC tablet Take 1 tablet (81 mg total) by mouth daily with breakfast. Please take Aspirin 81 mg daily along with Plavix 75 mg daily for 30  days then after that STOP the aspirin and continue ONLY Plavix 75 mg daily indefinitely--for stroke prevention 30 tablet 11   atorvastatin (LIPITOR) 40 MG tablet Take 1 tablet (40 mg total) by mouth daily. For Stroke Prevention 90 tablet 3   cloNIDine (CATAPRES) 0.1 MG tablet Take 1 tablet (0.1 mg total) by mouth 2 (two) times daily. 60 tablet 11   [START ON  05/21/2021] clopidogrel (PLAVIX) 75 MG tablet 1 tablet (75 mg total) by Per NG tube route daily. Please take Aspirin 81 mg daily along with Plavix 75 mg daily for 30 days then after that STOP the aspirin and continue ONLY Plavix 75 mg daily indefinitely--for stroke prevention 90 tablet 3   finasteride (PROSCAR) 5 MG tablet Take 1 tablet (5 mg total) by mouth daily. 90 tablet 3   glimepiride (AMARYL) 2 MG tablet Take 1 tablet (2 mg total) by mouth daily with breakfast. 30 tablet 11   metoprolol succinate (TOPROL-XL) 100 MG 24 hr tablet Take 1 tablet (100 mg total) by mouth daily. 90 tablet 3   [START ON 05/21/2021] pantoprazole (PROTONIX) 40 MG tablet Take 1 tablet (40 mg total) by mouth daily. 30 tablet 2   tamsulosin (FLOMAX) 0.4 MG CAPS capsule Take 1 capsule (0.4 mg total) by mouth daily. 90 capsule 3    Drug Regimen Review  Drug regimen was reviewed and remains appropriate with no significant issues identified  Home: Home Living Family/patient expects to be discharged to:: Private residence Living Arrangements:  (Lives with his parents for 2 years when moved from [place]) Available Help at Discharge: Available 24 hours/day Type of Home: House Home Access: Ramped entrance (ramp to porch then 1 step into home) Entrance Stairs-Number of Steps: 3 Entrance Stairs-Rails: Right Home Layout: One level Bathroom Shower/Tub: Engineer, manufacturing systems: Handicapped height Bathroom Accessibility: Yes Home Equipment: Environmental consultant - 2 wheels, Cane - single point  Lives With: Family   Functional History: Prior Function Level of Independence: Independent with assistive device(s) Comments: Retail buyer SPC, drives, shops  Functional Status:  Mobility: Bed Mobility Overal bed mobility: Needs Assistance Bed Mobility: Supine to Sit Supine to sit: Min assist Sit to supine: Min assist General bed mobility comments: Presents up in chair Transfers Overall transfer level: Needs  assistance Equipment used: Rolling walker (2 wheeled) Transfers: Sit to/from Stand, [employer] Transfers Sit to Stand: Min assist Stand pivot transfers: Min assist General transfer comment: increased time, labored movement Ambulation/Gait Ambulation/Gait assistance: Min guard, Min assist Gait Distance (Feet): 55 Feet Assistive device: Rolling walker (2 wheeled) Gait Pattern/deviations: Decreased step length - right, Decreased step length - left, Decreased stride length, Decreased dorsiflexion - left, Steppage General Gait Details: slow labored cadence with decreased left dorsiflexion with mild steppage gait on left foot, no loss of balance, limited mostly due to fatigue Gait velocity: decreased    ADL: ADL Overall ADL's : Needs assistance/impaired Eating/Feeding:  (NG tube.) Grooming: Standing, Minimal assistance, Min guard, Wash/dry face, Sitting, Set up, Applying deodorant, Supervision/safety Grooming Details (indicate cue type and reason): Per clinical judgement for standing. Sitting pt was able to wash face with s/u assist. SPV assist to open deodorant. Upper Body Bathing: Set up, Sitting Upper Body Bathing Details (indicate cue type and reason): Per clinical judgement. Lower Body Bathing: Minimal assistance, Sitting/lateral leans Lower Body Bathing Details (indicate cue type and reason): Per clinical judgement. Upper Body Dressing : Set up, Sitting Upper Body Dressing Details (indicate cue type and reason): Per clinical judgement. Lower  Body Dressing: Supervision/safety, Modified independent, Sitting/lateral leans Lower Body Dressing Details (indicate cue type and reason): Extended time with labored movement for pt to doff and don R sock seated in chair this date. Toilet Transfer: RW, Stand-pivot, Minimal assistance, Moderate assistance Toilet Transfer Details (indicate cue type and reason): Simulated via EOB to chair transfer. Toileting- Architect and Hygiene: Min  guard, Minimal assistance, Sit to/from stand Toileting - Clothing Manipulation Details (indicate cue type and reason): Per clinical judgement. Tub/ Shower Transfer: Minimal assistance, Moderate assistance, Rolling walker Tub/Shower Transfer Details (indicate cue type and reason): Per clinical judgement. Functional mobility during ADLs: Minimal assistance, Rolling walker, Moderate assistance (Mod A for sit to stand.) General ADL Comments: Slow labored movement with RW. Unsteady on feet.  Cognition: Cognition Overall Cognitive Status: Within Functional Limits for tasks assessed Arousal/Alertness: Awake/alert Orientation Level: Oriented X4 Year: 2022 Month: October Day of Week: Correct Memory: Appears intact Awareness: Appears intact Problem Solving: Appears intact Safety/Judgment: Appears intact Cognition Arousal/Alertness: Awake/alert Behavior During Therapy: WFL for tasks assessed/performed Overall Cognitive Status: Within Functional Limits for tasks assessed   Blood pressure (!) 146/89, pulse 92, temperature 98 F (36.7 C), resp. rate 19, height 6' (1.829 m), weight (!) 145.1 kg, SpO2 95 %. Physical Exam Vitals and nursing note reviewed.  Constitutional:      General: He is not in acute distress.    Appearance: Normal appearance. He is obese.  HENT:     Head: Normocephalic and atraumatic.     Comments: Poor dentition    Right Ear: External ear normal.     Left Ear: External ear normal.     Nose: Nose normal.  Eyes:     General:        Right eye: No discharge.        Left eye: No discharge.     Extraocular Movements: Extraocular movements intact.  Cardiovascular:     Rate and Rhythm: Normal rate and regular rhythm.  Pulmonary:     Effort: Pulmonary effort is normal. No respiratory distress.     Breath sounds: Examination of the left-upper field reveals rales. Rales present.  Abdominal:     General: Abdomen is flat. Bowel sounds are normal. There is no distension.   Musculoskeletal:     Cervical back: Normal range of motion and neck supple.     Comments: No edema in extremities LLE with some pain with manipulation  Skin:    General: Skin is warm and dry.  Neurological:     Mental Status: He is alert and oriented to person, place, and time.     Comments: Alert Severe dysarthria Able to answer biographic questions.  He was able to follow simple motor commands.  Sensation diminished to light touch LLE Motor: RUE/RLE; 5/5 proximal to distal LUE: 4/5 proximal to distal LLE: 3-/5 proximal to distal  Psychiatric:        Mood and Affect: Mood normal.        Behavior: Behavior normal.    Results for orders placed or performed during the hospital encounter of 05/16/21 (from the past 48 hour(s))  Glucose, capillary     Status: Abnormal   Collection Time: 05/18/21  4:08 PM  Result Value Ref Range   Glucose-Capillary 114 (H) 70 - 99 mg/dL    Comment: Glucose reference range applies only to samples taken after fasting for at least 8 hours.  Glucose, capillary     Status: None   Collection Time: 05/18/21  8:10 PM  Result Value Ref Range   Glucose-Capillary 98 70 - 99 mg/dL    Comment: Glucose reference range applies only to samples taken after fasting for at least 8 hours.  Renal function panel     Status: Abnormal   Collection Time: 05/19/21  6:09 AM  Result Value Ref Range   Sodium 139 135 - 145 mmol/L   Potassium 3.5 3.5 - 5.1 mmol/L   Chloride 108 98 - 111 mmol/L   CO2 22 22 - 32 mmol/L   Glucose, Bld 110 (H) 70 - 99 mg/dL    Comment: Glucose reference range applies only to samples taken after fasting for at least 8 hours.   BUN 25 (H) 6 - 20 mg/dL   Creatinine, Ser 6.46 (H) 0.61 - 1.24 mg/dL   Calcium 8.4 (L) 8.9 - 10.3 mg/dL   Phosphorus 2.9 2.5 - 4.6 mg/dL   Albumin 3.3 (L) 3.5 - 5.0 g/dL   GFR, Estimated 42 (L) >60 mL/min    Comment: (NOTE) Calculated using the CKD-EPI Creatinine Equation (2021)    Anion gap 9 5 - 15    Comment:  Performed at [facility] [address] [place], [place] [address]  CBC     Status: Abnormal   Collection Time: 05/19/21  6:09 AM  Result Value Ref Range   WBC 8.1 4.0 - 10.5 K/uL   RBC 5.13 4.22 - 5.81 MIL/uL   Hemoglobin 12.3 (L) 13.0 - 17.0 g/dL   HCT 22.4 82.5 - 00.3 %   MCV 79.5 (L) 80.0 - 100.0 fL   MCH 24.0 (L) 26.0 - 34.0 pg   MCHC 30.1 30.0 - 36.0 g/dL   RDW 70.4 88.8 - 91.6 %   Platelets 297 150 - 400 K/uL   nRBC 0.0 0.0 - 0.2 %    Comment: Performed at [facility], [address] [place], [place] [address]  Glucose, capillary     Status: Abnormal   Collection Time: 05/19/21  7:33 AM  Result Value Ref Range   Glucose-Capillary 107 (H) 70 - 99 mg/dL    Comment: Glucose reference range applies only to samples taken after fasting for at least 8 hours.  Glucose, capillary     Status: Abnormal   Collection Time: 05/19/21 11:00 AM  Result Value Ref Range   Glucose-Capillary 104 (H) 70 - 99 mg/dL    Comment: Glucose reference range applies only to samples taken after fasting for at least 8 hours.  Glucose, capillary     Status: Abnormal   Collection Time: 05/19/21  4:01 PM  Result Value Ref Range   Glucose-Capillary 123 (H) 70 - 99 mg/dL    Comment: Glucose reference range applies only to samples taken after fasting for at least 8 hours.  Glucose, capillary     Status: Abnormal   Collection Time: 05/19/21  8:10 PM  Result Value Ref Range   Glucose-Capillary 131 (H) 70 - 99 mg/dL    Comment: Glucose reference range applies only to samples taken after fasting for at least 8 hours.  Glucose, capillary     Status: None   Collection Time: 05/20/21  7:05 AM  Result Value Ref Range   Glucose-Capillary 86 70 - 99 mg/dL    Comment: Glucose reference range applies only to samples taken after fasting for at least 8 hours.  Glucose, capillary     Status: Abnormal   Collection Time: 05/20/21 11:13 AM  Result Value Ref Range   Glucose-Capillary 112 (H) 70 -  99 mg/dL    Comment:  Glucose reference range applies only to samples taken after fasting for at least 8 hours.   No results found.     Medical Problem List and Plan: 1.  Deficits with mobility, speech, self-care secondary to acute left brain nonhemorrhagic infarct.   -patient may shower  -ELOS/Goals: Supervision/Mod I with PT/OT and Min A with SLP.  Admit to CIR 2.  Antithrombotics: -DVT/anticoagulation:  Pharmaceutical: Lovenox--will check dopplers due to LLE pain  -antiplatelet therapy: DAPT x1 month followed by Plavix alone. 3. Pain Management: Tylenol as needed. 4. Mood: LCSW to follow for evaluation and support.  -antipsychotic agents: N/A 5. Neuropsych: This patient is not capable of making decisions on his own behalf. 6. Skin/Wound Care: Routine pressure-relief measures. 7. Fluids/Electrolytes/Nutrition: Will monitor intake/output.   CMP ordered for tomorrow AM  -- Will need assistance/encouragement for intake. 8.  HTN: Monitor blood pressures 3 times daily 5-7 days.   --Avoid HTN with intracranial/basilar artery stenosis.  Monitor with increased exertion 9.  T2DM with hyperglycemia: Hgb A1C- 6.6.  Was on metformin prior to admission which is being held due to CKD?  -- Monitor blood sugars achs and use SSI for elevated BS   Monitor with increased activity 10.  CKD stage IIIb: Serum creatinine 1.92 range per records.  -- We will change IV fluids to nocturnal.  CMP ordered 11.  Possible HCOM: Severe asymmetric LV septal hypertrophy per echo  -- Will need referral to cardiology at discharge 12. 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 Will need assistance with fluid intake.  --Will check CXR due to wet sounds/WOB 13. Dyslipidemia: LDL 139. On Crestor.  14. Morbid obesity: Educate on importance of diet/wt loss to promote health and mobility.   15.BPH?: Indicating difficulty with voiding--will resume Proscar and Flomax.  --toilet every 3-4 hours/assist  with urinal   Jacquelynn Cree, PA-C 05/20/2021  I have personally performed a face to face diagnostic evaluation, including, but not limited to relevant history and physical exam findings, of this patient and developed relevant assessment and plan.  Additionally, I have reviewed and concur with the physician assistant's documentation above.  Maryla Morrow, MD, ABPMR  The patient's status has not changed. Any changes from the pre-admission screening or documentation from the acute chart are noted above.   Maryla Morrow, MD, ABPMR

## 2021-05-20 NOTE — Progress Notes (Signed)
INPATIENT REHABILITATION ADMISSION NOTE   Arrival Method:     Mental Orientation: A&O x 4   Assessment: completed    Skin: no skin issues   IV'S: none present    Pain: no pain    Tubes and Drains: no tubes or drains   Safety Measures: High fall risk    Vital Signs: completed    Height and Weight: 72 inches 146.7 kg    Rehab Orientation: done   Family: mom will visit later today     Notes:

## 2021-05-20 NOTE — Discharge Summary (Addendum)
Brandon French, is a 55 y.o. male  DOB 09-Sep-1965  MRN [id number].  Admission date:  05/16/2021  Admitting Physician  Glade Lloyd, MD  Discharge Date:  05/20/2021   Primary MD  Pcp, No  Recommendations for primary care physician for things to follow:   1)Please take Aspirin 81 mg daily along with Plavix 75 mg daily for 30 days then after that STOP the aspirin and continue ONLY Plavix 75 mg daily indefinitely--for stroke prevention  2)Please follow-up with Neurologist Dr. Beryle Beams-- Phone: [phone], Address: [address], [place], [place] [address] in 4 to 6 weeks for recheck and reevaluation.  Please call to make appointment with him  3) please take all your medications as prescribed to prevent further strokes   Admission Diagnosis  Stroke [facility]) [I63.9] Acute ischemic stroke (HCC) [I63.9] Acute CVA (cerebrovascular accident) (HCC) [I63.9]   Discharge Diagnosis  Stroke (HCC) [I63.9] Acute ischemic stroke (HCC) [I63.9] Acute CVA (cerebrovascular accident) (HCC) [I63.9]    Active Problems:   Stroke (HCC)   DMII (diabetes mellitus, type 2) (HCC)   HTN (hypertension)   HLD (hyperlipidemia)   Acute CVA (cerebrovascular accident) (HCC)      History reviewed. No pertinent past medical history.  History reviewed. No pertinent surgical history.     HPI  from the history and physical done on the day of admission:     Brandon French  is a 55 y.o. male, with history of DMII, HTN, HLD and more presents to the ED with a chief of weakness and slurred speech.  Patient reports that the onset of symptoms was around 2:30 PM.  He later then said he actually went to sleep at 2:30 PM and woke up at 5:30 PM with slurred speech.  He reports he is never had this problem before.  He has had left upper and lower extremity weakness as well.  He has been drooling as well.  He has had trouble  swallowing.  Patient has had a history of stroke and started blood pressure medication after the last stroke.  He is difficult to understand due to slurred and garbled speech, but it sounds like the left-sided weakness was present after the last stroke.  He does think it is worse now though.  Patient reports that he was otherwise in his normal state of health up until this started this afternoon.  No other complaints at this time.   Patient does not smoke, does not drink, does not use illicit drugs.   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  Neurology was consulted and recommended MRI of brain and MRA of brain and neck along with echo and official neurology consultation.  He failed swallow evaluation and NG tube was placed, patient pulled NG tube out -Transfer to inpatient rehab/CIR   Assessment & Plan:   1)Acute CVA- Acute stroke with motor, sensory and speech and swallowing deficits -MRI Brain , MRA Head and Neck with Acute nonhemorrhagic 12 mm infarct in the posterior limb of the left internal capsule or lateral thalamus  findings consistent with basilar artery stenosis -Also showed multiple remote nonhemorrhagic lacunar infarcts in multiple locations -PTA had residual/old left-sided weakness -Now with new pronounced speech and swallowing difficulties, unsteady gait and right-sided weakness -PT/OT/speech eval appreciated- -Recommending inpatient rehab/CIR -2D echo with EF of 65 to 70%, cannot rule out hypertrophic cardiomyopathy-May benefit from cardiac MRI as outpatient -LDL 139; A1c 6.6 -Continue aspirin, Plavix and lipitor--please see discharge instructions regarding aspirin and Plavix   2)FEN/Dysphagia--speech eval appreciated speech and swallowing difficulties actually worsened , patient is now agreeable to getting the PEG tube if needed -General surgery consult for PEG tube placement for feeding purposes requested -In the meantime-Cortrac Team at [facility] can place feeding tube if needed and Gi/Gen surgery at [facility] can do PEG at [facility]--next week if needed   3)Possible hypertrophic cardiomyopathy --- echo showed 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 -Creatinine currently stable.   -Patient is at risk for dehydration and worsening renal function given feeding difficulties due to acute stroke with dysphagia   6)Diabetes mellitus type 2--A1c 6.6 reflecting good diabetic control PTA -Hold metformin due to kidney concerns -- May use Amaryl every morning with breakfast -Check sugars before meals and at bedtime  7)Morbid Obesity- -Low calorie diet, portion control and increase physical activity discussed with patient -Body mass index is 43.38 kg/m.   Code Status:  Full Family Communication: Mother at bedside  Disposition Plan:--  inpatient rehab/CIR    Consultants: Neurology/general surgery   Procedures: None   Antimicrobials: None  Discharge Condition: Stable  Follow UP   Follow-up Information     Beryle Beams, MD. Schedule an appointment as soon as possible for a visit in 1 month(s).   Specialty: Neurology Why: For stroke follow-up Contact information: [address] [place] [address] [phone]                 Diet and Activity recommendation:  As advised  Discharge Instructions    Discharge Instructions     Call MD for:  difficulty breathing, headache or visual disturbances   Complete by: As directed    Call MD for:  persistant dizziness or light-headedness   Complete by: As directed    Call MD for:  persistant nausea and vomiting   Complete by: As directed    Call MD for:  severe uncontrolled pain  Complete by: As directed    Call MD for:  temperature >100.4   Complete by: As directed    Diet - low sodium heart healthy   Complete by: As directed    Dysphagia 1 diet with honey thick liquids   Discharge instructions   Complete by: As directed    1)Please take Aspirin 81 mg daily along with Plavix 75 mg daily for 30 days then after that STOP the aspirin and continue ONLY Plavix 75 mg daily indefinitely--for stroke prevention  2)Please follow-up with Neurologist Dr. Beryle Beams-- Phone: [phone], Address: [address], [place], [place] [address] in 4 to 6 weeks for recheck and reevaluation.  Please call to make appointment with him  3) please take all your medications as prescribed to prevent further strokes   Increase activity slowly   Complete by: As directed        Discharge Medications     Allergies as of 05/20/2021       Reactions   Aleve [naproxen]         Medication List     STOP taking these medications    hydrochlorothiazide 25 MG tablet Commonly known as: HYDRODIURIL    metFORMIN 500 MG tablet Commonly known as: GLUCOPHAGE       TAKE these medications    acetaminophen 325 MG tablet Commonly known as: TYLENOL Take 2 tablets (650 mg total) by mouth every 4 (four) hours as needed for mild pain (or temp > 37.5 C (99.5 F)).   amLODipine 5 MG tablet Commonly known as: NORVASC Take 1 tablet (5 mg total) by mouth daily. Start taking on: May 21, 2021   aspirin 81 MG EC tablet Take 1 tablet (81 mg total) by mouth daily with breakfast. Please take Aspirin 81 mg daily along with Plavix 75 mg daily for 30 days then after that STOP the aspirin and continue ONLY Plavix 75 mg daily indefinitely--for stroke prevention Start taking on: May 21, 2021   atorvastatin 40 MG tablet Commonly known as: LIPITOR Take 1 tablet (40 mg total) by mouth daily. For Stroke Prevention What changed: additional instructions   cloNIDine 0.1 MG tablet Commonly known as: CATAPRES Take 1 tablet (0.1 mg total) by mouth 2 (two) times daily.   clopidogrel 75 MG tablet Commonly known as: PLAVIX 1 tablet (75 mg total) by Per NG tube route daily. Please take Aspirin 81 mg daily along with Plavix 75 mg daily for 30 days then after that STOP the aspirin and continue ONLY Plavix 75 mg daily indefinitely--for stroke prevention Start taking on: May 21, 2021   finasteride 5 MG tablet Commonly known as: PROSCAR Take 1 tablet (5 mg total) by mouth daily. What changed:  how much to take when to take this   glimepiride 2 MG tablet Commonly known as: Amaryl Take 1 tablet (2 mg total) by mouth daily with breakfast.   metoprolol succinate 100 MG 24 hr tablet Commonly known as: TOPROL-XL Take 1 tablet (100 mg total) by mouth daily.   pantoprazole 40 MG tablet Commonly known as: PROTONIX Take 1 tablet (40 mg total) by mouth daily. Start taking on: May 21, 2021   tamsulosin 0.4 MG Caps capsule Commonly known as: FLOMAX Take 1 capsule (0.4 mg total) by mouth daily.        Major procedures and Radiology Reports - PLEASE review detailed and final reports for all details, in brief -   DG Abdomen 1 View  Result Date: 05/16/2021  CLINICAL DATA:  Check gastric catheter placement EXAM: ABDOMEN - 1 VIEW COMPARISON:  Film from earlier in the same day. FINDINGS: Gastric catheter has been advanced. Proximal side port now lies within the stomach. IMPRESSION: Gastric catheter in satisfactory position. Electronically Signed   By: Alcide Clever M.D.   On: 05/16/2021 23:37   DG Abdomen 1 View  Result Date: 05/16/2021 CLINICAL DATA:  Check gastric catheter placement EXAM: ABDOMEN - 1 VIEW COMPARISON:  None. FINDINGS: Gastric catheter is noted with the tip in the stomach. Proximal side port is noted at the gastroesophageal junction. This could be advanced deeper into the stomach. No obstructive changes are noted. IMPRESSION: Gastric catheter as described. This could be advanced slightly deeper into the stomach. Electronically Signed   By: Alcide Clever M.D.   On: 05/16/2021 21:59   MR ANGIO HEAD WO CONTRAST  Result Date: 05/17/2021 CLINICAL DATA:  Neuro deficit, acute, stroke suspected. Weakness and slurred speech. New onset of symptoms yesterday. New onset of dysarthria. Chronic left-sided weakness. EXAM: MRI HEAD WITHOUT CONTRAST MRA HEAD WITHOUT CONTRAST MRA NECK WITHOUT AND WITH CONTRAST TECHNIQUE: Multiplanar, multiecho pulse sequences of the brain and surrounding structures were obtained without intravenous contrast. Angiographic images of the Circle of Willis were obtained using MRA technique without intravenous contrast. Angiographic images of the neck were obtained using MRA technique without and with intravenous contrast. Carotid stenosis measurements (when applicable) are obtained utilizing NASCET criteria, using the distal internal carotid diameter as the denominator. CONTRAST:  10mL GADAVIST GADOBUTROL 1 MMOL/ML IV SOLN COMPARISON:  CT head without contrast 05/16/2021  FINDINGS: MRI HEAD FINDINGS Brain: Acute nonhemorrhagic 12 mm infarct is present in the posterior limb of the left internal capsule or lateral thalamus. Remote nonhemorrhagic lacunar infarcts are present in the thalami bilaterally. Multiple remote lacunar infarcts are present in the corona radiata bilaterally. Multiple remote lacunar infarcts are present within the right paramedian brainstem and cerebellum bilaterally. A larger area of encephalomalacia is consistent with a remote left superior cerebellar infarct. Multiple foci of susceptibility are present in the posterior and medial thalami bilaterally. Additional subcortical susceptibility foci are present in the operculum bilaterally, left greater than right and scattered throughout the subcortical frontal lobes. No acute hemorrhage is present. No mass lesion is present. The ventricles are of normal size. No significant extraaxial fluid collection is present. The internal auditory canals are within normal limits. Vascular: Flow is present in the major intracranial arteries. Skull and upper cervical spine: The craniocervical junction is normal. Upper cervical spine is within normal limits. Marrow signal is unremarkable. Sinuses/Orbits: The globes and orbits are within normal limits. Extensive left mastoid effusion is present. Small right mastoid effusion is present. No obstructing nasopharyngeal lesion is present. The paranasal sinuses are otherwise clear. MRA HEAD FINDINGS Internal carotid arteries are within normal limits from the high cervical segments through the ICA termini. Artifactual signal loss is present at the skull base on the right. Mild narrowing is present at the proximal left A1 segment. No significant A1 or M1 segment stenosis is present. MCA bifurcations are intact. Signal loss is present the proximal MCA branch vessels bilaterally. Given more rib os distal MCA branch vessel signal, this is likely in part artifactual. There is some asymmetric  perfusion to left MCA branch vessels. The left vertebral artery is the dominant vessel. Moderate narrowing is present in the distal right V4 segment. There is some irregularity of the basilar artery without a focal basilar artery stenosis. Prominent posterior cerebral arteries  are present bilaterally, right greater than left. Moderate to high-grade proximal P1 segment stenoses are present bilaterally. The right posterior cerebral artery is not visualized beyond the proximal P2 segment. A high-grade left P2 segment stenosis is present with poor signal distally. MRA NECK FINDINGS Time-of-flight images demonstrate no significant flow disturbance to either carotid artery bifurcation. Flow is antegrade in the vertebral arteries bilaterally. Postcontrast images demonstrate no significant stenosis at the aortic arch. Common origin of the left common carotid artery and not a min artery is present. The right common carotid artery is within normal limits. Bifurcation is unremarkable for only mild atherosclerotic disease. No significant stenosis. Cervical right ICA is normal. The left common carotid artery is mildly tortuous. Mild narrowing of less than 50% is suggested in the mid left common carotid artery. Minimal atherosclerotic changes present at the left carotid bifurcation without a significant stenosis. The left ICA is within normal limits. The left vertebral artery is dominant. Distal right V4 segment stenosis confirmed. Moderate distal basilar artery stenosis is likely present. 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 IMPRESSION: 1. Acute nonhemorrhagic 12 mm infarct in the posterior limb of the left internal capsule or lateral thalamus. 2. Remote nonhemorrhagic lacunar infarcts of the thalami bilaterally with additional remote lacunar infarcts of the corona radiata bilaterally. This is likely the sequelae of the high-grade proximal P1 segment stenoses with impact on the meticulous straight arteries. 3. Multiple remote  lacunar infarcts of the right paramedian brainstem and cerebellum bilaterally. 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 4. Asymmetric attenuation to left MCA branch vessels suggesting either proximal MCA narrowing or overall decreased perfusion. 5. Moderate to high-grade proximal P1 segment stenoses bilaterally. 6. Moderate distal basilar artery stenosis. 7. Less than 50% stenosis of the mid left common carotid artery. These results were called by telephone at the time of interpretation on 05/17/2021 at 9:24 am to provider Hanley Ben, who verbally acknowledged these results. Electronically Signed   By: Marin Roberts M.D.   On: 05/17/2021 09:24   MR ANGIO NECK W WO CONTRAST  Result Date: 05/17/2021 CLINICAL DATA:  Neuro deficit, acute, stroke suspected. Weakness and slurred speech. New onset of symptoms yesterday. New onset of dysarthria. Chronic left-sided weakness. EXAM: MRI HEAD WITHOUT CONTRAST MRA HEAD WITHOUT CONTRAST MRA NECK WITHOUT AND WITH CONTRAST TECHNIQUE: Multiplanar, multiecho pulse sequences of the brain and surrounding structures were obtained without intravenous contrast. Angiographic images of the Circle of Willis were obtained using MRA technique without intravenous contrast. Angiographic images of the neck were obtained using MRA technique without and with intravenous contrast. Carotid stenosis measurements (when applicable) are obtained utilizing NASCET criteria, using the distal internal carotid diameter as the denominator. CONTRAST:  10mL GADAVIST GADOBUTROL 1 MMOL/ML IV SOLN COMPARISON:  CT head without contrast 05/16/2021 FINDINGS: MRI HEAD FINDINGS Brain: Acute nonhemorrhagic 12 mm infarct is present in the posterior limb of the left internal capsule or lateral thalamus. Remote nonhemorrhagic lacunar infarcts are present in the thalami bilaterally. Multiple remote lacunar infarcts are present in the corona radiata bilaterally. Multiple remote lacunar infarcts are  present within the right paramedian brainstem and cerebellum bilaterally. A larger area of encephalomalacia is consistent with a remote left superior cerebellar infarct. Multiple foci of susceptibility are present in the posterior and medial thalami bilaterally. Additional subcortical susceptibility foci are present in the operculum bilaterally, left greater than right and scattered throughout the subcortical frontal lobes. No acute hemorrhage is present. No mass lesion is present. The ventricles are of normal size.  No significant extraaxial fluid collection is present. The internal auditory canals are within normal limits. Vascular: Flow is present in the major intracranial arteries. Skull and upper cervical spine: The craniocervical junction is normal. Upper cervical spine is within normal limits. Marrow signal is unremarkable. Sinuses/Orbits: The globes and orbits are within normal limits. Extensive left mastoid effusion is present. Small right mastoid effusion is present. No obstructing nasopharyngeal lesion is present. The paranasal sinuses are otherwise clear. MRA HEAD FINDINGS Internal carotid arteries are within normal limits from the high cervical segments through the ICA termini. Artifactual signal loss is present at the skull base on the right. Mild narrowing is present at the proximal left A1 segment. No significant A1 or M1 segment stenosis is present. MCA bifurcations are intact. Signal loss is present the proximal MCA branch vessels bilaterally. Given more rib os distal MCA branch vessel signal, this is likely in part artifactual. There is some asymmetric perfusion to left MCA branch vessels. The left vertebral artery is the dominant vessel. Moderate narrowing is present in the distal right V4 segment. There is some irregularity of the basilar artery without a focal basilar artery stenosis. Prominent posterior cerebral arteries are present bilaterally, right greater than left. Moderate to high-grade  proximal P1 segment stenoses are present bilaterally. The right posterior cerebral artery is not visualized beyond the proximal P2 segment. A high-grade left P2 segment stenosis is present with poor signal distally. MRA NECK FINDINGS Time-of-flight images demonstrate no significant flow disturbance to either carotid artery bifurcation. Flow is antegrade in the vertebral arteries bilaterally. Postcontrast images demonstrate no significant stenosis at the aortic arch. Common origin of the left common carotid artery and not a min artery is present. The right common carotid artery is within normal limits. Bifurcation is unremarkable for only mild atherosclerotic disease. No significant stenosis. Cervical right ICA is normal. The left common carotid artery is mildly tortuous. Mild narrowing of less than 50% is suggested in the mid left common carotid artery. Minimal atherosclerotic changes present at the left carotid bifurcation without a significant stenosis. The left ICA is within normal limits. The left vertebral artery is dominant. Distal right V4 segment stenosis confirmed. Moderate distal basilar artery stenosis is likely present. 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 IMPRESSION: 1. Acute nonhemorrhagic 12 mm infarct in the posterior limb of the left internal capsule or lateral thalamus. 2. Remote nonhemorrhagic lacunar infarcts of the thalami bilaterally with additional remote lacunar infarcts of the corona radiata bilaterally. This is likely the sequelae of the high-grade proximal P1 segment stenoses with impact on the meticulous straight arteries. 3. Multiple remote lacunar infarcts of the right paramedian brainstem and cerebellum bilaterally. 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 4. Asymmetric attenuation to left MCA branch vessels suggesting either proximal MCA narrowing or overall decreased perfusion. 5. Moderate to high-grade proximal P1 segment stenoses bilaterally. 6. Moderate distal  basilar artery stenosis. 7. Less than 50% stenosis of the mid left common carotid artery. These results were called by telephone at the time of interpretation on 05/17/2021 at 9:24 am to provider Hanley Ben, who verbally acknowledged these results. Electronically Signed   By: Marin Roberts M.D.   On: 05/17/2021 09:24   MR BRAIN WO CONTRAST  Result Date: 05/17/2021 CLINICAL DATA:  Neuro deficit, acute, stroke suspected. Weakness and slurred speech. New onset of symptoms yesterday. New onset of dysarthria. Chronic left-sided weakness. EXAM: MRI HEAD WITHOUT CONTRAST MRA HEAD WITHOUT CONTRAST MRA NECK WITHOUT AND WITH CONTRAST TECHNIQUE: Multiplanar, multiecho  pulse sequences of the brain and surrounding structures were obtained without intravenous contrast. Angiographic images of the Circle of Willis were obtained using MRA technique without intravenous contrast. Angiographic images of the neck were obtained using MRA technique without and with intravenous contrast. Carotid stenosis measurements (when applicable) are obtained utilizing NASCET criteria, using the distal internal carotid diameter as the denominator. CONTRAST:  53mL GADAVIST GADOBUTROL 1 MMOL/ML IV SOLN COMPARISON:  CT head without contrast 05/16/2021 FINDINGS: MRI HEAD FINDINGS Brain: Acute nonhemorrhagic 12 mm infarct is present in the posterior limb of the left internal capsule or lateral thalamus. Remote nonhemorrhagic lacunar infarcts are present in the thalami bilaterally. Multiple remote lacunar infarcts are present in the corona radiata bilaterally. Multiple remote lacunar infarcts are present within the right paramedian brainstem and cerebellum bilaterally. A larger area of encephalomalacia is consistent with a remote left superior cerebellar infarct. Multiple foci of susceptibility are present in the posterior and medial thalami bilaterally. Additional subcortical susceptibility foci are present in the operculum bilaterally, left greater  than right and scattered throughout the subcortical frontal lobes. No acute hemorrhage is present. No mass lesion is present. The ventricles are of normal size. No significant extraaxial fluid collection is present. The internal auditory canals are within normal limits. Vascular: Flow is present in the major intracranial arteries. Skull and upper cervical spine: The craniocervical junction is normal. Upper cervical spine is within normal limits. Marrow signal is unremarkable. Sinuses/Orbits: The globes and orbits are within normal limits. Extensive left mastoid effusion is present. Small right mastoid effusion is present. No obstructing nasopharyngeal lesion is present. The paranasal sinuses are otherwise clear. MRA HEAD FINDINGS Internal carotid arteries are within normal limits from the high cervical segments through the ICA termini. Artifactual signal loss is present at the skull base on the right. Mild narrowing is present at the proximal left A1 segment. No significant A1 or M1 segment stenosis is present. MCA bifurcations are intact. Signal loss is present the proximal MCA branch vessels bilaterally. Given more rib os distal MCA branch vessel signal, this is likely in part artifactual. There is some asymmetric perfusion to left MCA branch vessels. The left vertebral artery is the dominant vessel. Moderate narrowing is present in the distal right V4 segment. There is some irregularity of the basilar artery without a focal basilar artery stenosis. Prominent posterior cerebral arteries are present bilaterally, right greater than left. Moderate to high-grade proximal P1 segment stenoses are present bilaterally. The right posterior cerebral artery is not visualized beyond the proximal P2 segment. A high-grade left P2 segment stenosis is present with poor signal distally. MRA NECK FINDINGS Time-of-flight images demonstrate no significant flow disturbance to either carotid artery bifurcation. Flow is antegrade in the  vertebral arteries bilaterally. Postcontrast images demonstrate no significant stenosis at the aortic arch. Common origin of the left common carotid artery and not a min artery is present. The right common carotid artery is within normal limits. Bifurcation is unremarkable for only mild atherosclerotic disease. No significant stenosis. Cervical right ICA is normal. The left common carotid artery is mildly tortuous. Mild narrowing of less than 50% is suggested in the mid left common carotid artery. Minimal atherosclerotic changes present at the left carotid bifurcation without a significant stenosis. The left ICA is within normal limits. The left vertebral artery is dominant. Distal right V4 segment stenosis confirmed. Moderate distal basilar artery stenosis is likely present. 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 IMPRESSION: 1. Acute nonhemorrhagic 12 mm infarct in the posterior limb  of the left internal capsule or lateral thalamus. 2. Remote nonhemorrhagic lacunar infarcts of the thalami bilaterally with additional remote lacunar infarcts of the corona radiata bilaterally. This is likely the sequelae of the high-grade proximal P1 segment stenoses with impact on the meticulous straight arteries. 3. Multiple remote lacunar infarcts of the right paramedian brainstem and cerebellum bilaterally. 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 4. Asymmetric attenuation to left MCA branch vessels suggesting either proximal MCA narrowing or overall decreased perfusion. 5. Moderate to high-grade proximal P1 segment stenoses bilaterally. 6. Moderate distal basilar artery stenosis. 7. Less than 50% stenosis of the mid left common carotid artery. These results were called by telephone at the time of interpretation on 05/17/2021 at 9:24 am to provider Hanley Ben, who verbally acknowledged these results. Electronically Signed   By: Marin Roberts M.D.   On: 05/17/2021 09:24   DG Swallowing Func-Speech  Pathology  Result Date: 05/19/2021 Table formatting from the original result was not included. Objective Swallowing Evaluation: Type of Study: MBS-Modified Barium Swallow Study  Patient Details Name: Brandon French MRN: [id number] Date of Birth: 06-25-1966 Today's Date: 05/18/2021 Time: SLP Start Time (ACUTE ONLY): 1230 -SLP Stop Time (ACUTE ONLY): 1300 SLP Time Calculation (min) (ACUTE ONLY): 30 min Past Medical History: No past medical history on file. Past Surgical History: No past surgical history on file. HPI: 56 year old male with history of diabetes mellitus type 2, hypertension, hyperlipidemia, unspecified stroke with possible residual left-sided weakness presented with weakness and slurred speech.  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  He failed swallow screen and NG tube was placed. MRI shows Acute nonhemorrhagic 12 mm infarct in the posterior limb of the left internal capsule or lateral thalamus. BSE and SLE requested.  Subjective: "Trygve" Assessment / Plan / Recommendation CHL IP CLINICAL IMPRESSIONS 05/18/2021 Clinical Impression Pt presents with severe oral phase and mild/moderate pharyngeal phase characterized by reduced labial closure, impaired lingual movement, and impaired mastication resulting in labial spillage, reduced oral containment, impaired anterior posterior transit, premature spillage, and oral residuals; pharyngeal phase is marked by delay in swallow initiation (negatively impacted by premature spillage with liquids) and reduced laryngeal vestibule closure resulting in variable penetration and aspiration of liquids (thin, nectar, honey) which Pt inconsistently sensed via cough (not removed). Pt tolerated teaspoon presentations of honey thick liquids best. He required verbal cues to move his tongue and to "chew" in order to try and engage oral phase. Although manipulation of semi-solids was challenging, this will be good for him to  practice via trials with SLP. He appears to have the greatest difficulty moving the anterior portion of his tongue, once he was able to move the pill posteriorly, he swallowed in puree without incident. Pt tells SLP that he would not want a feeding tube, however Pt may benefit from PEG in order to meet nutritional needs (suspect fatigue will prevent him from eating enough by mouth with modified diet). Can initiate D1(puree) and HTL (honey thick liquids) via self presented teaspoons and trials mech soft with SLP. Will discuss with MD. SLP Visit Diagnosis Dysphagia, oropharyngeal phase (R13.12) Attention and concentration deficit following -- Frontal lobe and executive function deficit following -- Impact on safety and function Moderate aspiration risk;Risk for inadequate nutrition/hydration   CHL IP TREATMENT RECOMMENDATION 05/18/2021 Treatment Recommendations Therapy as outlined in treatment plan below   Prognosis 05/18/2021 Prognosis for Safe Diet Advancement Fair Barriers to Reach Goals Severity of deficits Barriers/Prognosis Comment --  CHL IP DIET RECOMMENDATION 05/18/2021 SLP Diet Recommendations Dysphagia 1 (Puree) solids;Honey thick liquids Liquid Administration via Spoon Medication Administration Crushed with puree Compensations Small sips/bites;Lingual sweep for clearance of pocketing;Multiple dry swallows after each bite/sip Postural Changes Remain semi-upright after after feeds/meals (Comment);Seated upright at 90 degrees   CHL IP OTHER RECOMMENDATIONS 05/18/2021 Recommended Consults -- Oral Care Recommendations Oral care before and after PO Other Recommendations Order thickener from pharmacy;Prohibited food (jello, ice cream, thin soups);Clarify dietary restrictions   CHL IP FOLLOW UP RECOMMENDATIONS 05/18/2021 Follow up Recommendations Outpatient SLP;Home health SLP;Skilled Nursing facility   [facility] IP FREQUENCY AND DURATION 05/18/2021 Speech Therapy Frequency (ACUTE ONLY) min 2x/week Treatment Duration 1  week      CHL IP ORAL PHASE 05/18/2021 Oral Phase Impaired Oral - Pudding Teaspoon -- Oral - Pudding Cup -- Oral - Honey Teaspoon Weak lingual manipulation;Reduced posterior propulsion;Premature spillage;Lingual/palatal residue Oral - Honey Cup Weak lingual manipulation;Reduced posterior propulsion;Lingual/palatal residue;Delayed oral transit;Decreased bolus cohesion;Premature spillage Oral - Nectar Teaspoon Weak lingual manipulation;Reduced posterior propulsion;Delayed oral transit;Decreased bolus cohesion;Premature spillage Oral - Nectar Cup Weak lingual manipulation;Reduced posterior propulsion;Lingual/palatal residue;Delayed oral transit;Decreased bolus cohesion;Premature spillage Oral - Nectar Straw -- Oral - Thin Teaspoon Weak lingual manipulation;Delayed oral transit;Decreased bolus cohesion;Premature spillage Oral - Thin Cup -- Oral - Thin Straw -- Oral - Puree Weak lingual manipulation;Reduced posterior propulsion;Lingual/palatal residue;Piecemeal swallowing;Delayed oral transit;Decreased bolus cohesion Oral - Mech Soft Impaired mastication;Weak lingual manipulation;Reduced posterior propulsion;Lingual/palatal residue;Piecemeal swallowing;Delayed oral transit;Decreased bolus cohesion Oral - Regular -- Oral - Multi-Consistency -- Oral - Pill Weak lingual manipulation;Reduced posterior propulsion Oral Phase - Comment --  CHL IP PHARYNGEAL PHASE 05/18/2021 Pharyngeal Phase Impaired Pharyngeal- Pudding Teaspoon -- Pharyngeal -- Pharyngeal- Pudding Cup -- Pharyngeal -- Pharyngeal- Honey Teaspoon Delayed swallow initiation-vallecula Pharyngeal -- Pharyngeal- Honey Cup Delayed swallow initiation-pyriform sinuses;Reduced airway/laryngeal closure;Penetration/Apiration after swallow;Trace aspiration Pharyngeal Material enters airway, passes BELOW cords without attempt by patient to eject out (silent aspiration) Pharyngeal- Nectar Teaspoon Delayed swallow initiation-pyriform sinuses Pharyngeal Material does not enter  airway Pharyngeal- Nectar Cup Delayed swallow initiation-pyriform sinuses;Reduced airway/laryngeal closure;Penetration/Aspiration before swallow Pharyngeal Material enters airway, remains ABOVE vocal cords and not ejected out Pharyngeal- Nectar Straw -- Pharyngeal -- Pharyngeal- Thin Teaspoon Delayed swallow initiation-pyriform sinuses;Penetration/Aspiration during swallow;Reduced airway/laryngeal closure Pharyngeal Material enters airway, passes BELOW cords and not ejected out despite cough attempt by patient Pharyngeal- Thin Cup -- Pharyngeal -- Pharyngeal- Thin Straw -- Pharyngeal -- Pharyngeal- Puree WFL Pharyngeal -- Pharyngeal- Mechanical Soft WFL Pharyngeal -- Pharyngeal- Regular -- Pharyngeal -- Pharyngeal- Multi-consistency -- Pharyngeal -- Pharyngeal- Pill WFL Pharyngeal -- Pharyngeal Comment --  CHL IP CERVICAL ESOPHAGEAL PHASE 05/18/2021 Cervical Esophageal Phase WFL Pudding Teaspoon -- Pudding Cup -- Honey Teaspoon -- Honey Cup -- Nectar Teaspoon -- Nectar Cup -- Nectar Straw -- Thin Teaspoon -- Thin Cup -- Thin Straw -- Puree -- Mechanical Soft -- Regular -- Multi-consistency -- Pill -- Cervical Esophageal Comment -- Thank you, Havery Moros, CCC-SLP [phone] PORTER,DABNEY 05/18/2021, 2:13 PM              ECHOCARDIOGRAM COMPLETE  Result Date: 05/18/2021    ECHOCARDIOGRAM REPORT   Patient Name:   Brandon French Date of Exam: 05/18/2021 Medical Rec #:  [id number]      Height:       72.0 in Accession #:    [id number]     Weight:       319.9 lb Date of Birth:  25-Oct-1965     BSA:  2.601 m Patient Age:    54 years       BP:           169/33 mmHg Patient Gender: M              HR:           82 bpm. [facility] Procedure: 2D Echo, Cardiac Doppler and Color Doppler Indications:    Stroke  History:        Patient has no prior history of Echocardiogram examinations.                 Stroke; Risk Factors:Diabetes and Dyslipidemia.  Sonographer:    Mikki Harbor Referring Phys:  [id number] ASIA B ZIERLE-GHOSH IMPRESSIONS  1. Left ventricular ejection fraction, by estimation, is 65 to 70%. The left ventricle has normal function. The left ventricle has no regional wall motion abnormalities. There is severe asymmetric left ventricular hypertrophy of the septal segment (21 mm). Left ventricular diastolic parameters are indeterminate. No obvious LVOT gradient. Although history of hypertension present, consider elective cardiology referral for evaluation of hypertrophic cardiomyopathy. Non-urgent cardiac MRI can be considered.  2. Right ventricular systolic function is normal. The right ventricular size is normal. Tricuspid regurgitation signal is inadequate for assessing PA pressure.  3. The mitral valve is grossly normal. Trivial mitral valve regurgitation.  4. The aortic valve is tricuspid. Aortic valve regurgitation is not visualized. No aortic stenosis is present. Aortic valve mean gradient measures 5.0 mmHg.  5. Aortic dilatation noted. There is borderline dilatation of the aortic root, measuring 40 mm.  6. Unable to estimate CVP. Comparison(s): No prior Echocardiogram. FINDINGS  Left Ventricle: Left ventricular ejection fraction, by estimation, is 65 to 70%. The left ventricle has normal function. The left ventricle has no regional wall motion abnormalities. The left ventricular internal cavity size was normal in size. There is  severe asymmetric left ventricular hypertrophy of the septal segment. Left ventricular diastolic parameters are indeterminate. Right Ventricle: The right ventricular size is normal. No increase in right ventricular wall thickness. Right ventricular systolic function is normal. Tricuspid regurgitation signal is inadequate for assessing PA pressure. Left Atrium: Left atrial size was normal in size. Right Atrium: Right atrial size was normal in size. Pericardium: There is no evidence of pericardial effusion. Mitral Valve: The mitral valve is grossly normal. Trivial  mitral valve regurgitation. MV peak gradient, 8.1 mmHg. The mean mitral valve gradient is 4.0 mmHg. Tricuspid Valve: The tricuspid valve is grossly normal. Tricuspid valve regurgitation is trivial. Aortic Valve: The aortic valve is tricuspid. There is mild aortic valve annular calcification. Aortic valve regurgitation is not visualized. No aortic stenosis is present. Aortic valve mean gradient measures 5.0 mmHg. Aortic valve peak gradient measures 9.6 mmHg. Aortic valve area, by VTI measures 2.86 cm. Pulmonic Valve: The pulmonic valve was grossly normal. Pulmonic valve regurgitation is trivial. Aorta: Aortic dilatation noted. There is borderline dilatation of the aortic root, measuring 40 mm. Venous: Unable to estimate CVP. The inferior vena cava was not well visualized. IAS/Shunts: No atrial level shunt detected by color flow Doppler.  LEFT VENTRICLE PLAX 2D LVIDd:         4.50 cm   Diastology LVIDs:         3.10 cm   LV e' medial:    6.20 cm/s LV PW:         1.40 cm   LV E/e' medial:  12.0 LV IVS:  2.10 cm   LV e' lateral:   10.30 cm/s LVOT diam:     2.00 cm   LV E/e' lateral: 7.2 LV SV:         78 LV SV Index:   30 LVOT Area:     3.14 cm  RIGHT VENTRICLE RV Basal diam:  3.80 cm RV Mid diam:    3.20 cm RV S prime:     12.50 cm/s TAPSE (M-mode): 2.4 cm LEFT ATRIUM             Index        RIGHT ATRIUM           Index LA diam:        3.00 cm 1.15 cm/m   RA Area:     21.40 cm LA Vol (A2C):   82.0 ml 31.52 ml/m  RA Volume:   60.60 ml  23.30 ml/m LA Vol (A4C):   64.2 ml 24.68 ml/m LA Biplane Vol: 77.0 ml 29.60 ml/m  AORTIC VALVE                    PULMONIC VALVE AV Area (Vmax):    2.55 cm     PV Vmax:       0.86 m/s AV Area (Vmean):   2.96 cm     PV Peak grad:  3.0 mmHg AV Area (VTI):     2.86 cm AV Vmax:           155.00 cm/s AV Vmean:          97.900 cm/s AV VTI:            0.271 m AV Peak Grad:      9.6 mmHg AV Mean Grad:      5.0 mmHg LVOT Vmax:         126.00 cm/s LVOT Vmean:        92.100 cm/s  LVOT VTI:          0.247 m LVOT/AV VTI ratio: 0.91  AORTA Ao Root diam: 4.00 cm MITRAL VALVE MV Area (PHT): 2.80 cm    SHUNTS MV Area VTI:   3.17 cm    Systemic VTI:  0.25 m MV Peak grad:  8.1 mmHg    Systemic Diam: 2.00 cm MV Mean grad:  4.0 mmHg MV Vmax:       1.42 m/s MV Vmean:      87.8 cm/s MV Decel Time: 271 msec MV E velocity: 74.10 cm/s MV A velocity: 98.50 cm/s MV E/A ratio:  0.75 Nona Dell MD Electronically signed by Nona Dell MD Signature Date/Time: 05/18/2021/12:05:14 PM    Final    CT HEAD CODE STROKE WO CONTRAST  Result Date: 05/16/2021 CLINICAL DATA:  Code stroke. Neuro deficit, acute, stroke suspected. Left-sided weakness and slurred speech beginning today. EXAM: CT HEAD WITHOUT CONTRAST TECHNIQUE: Contiguous axial images were obtained from the base of the skull through the vertex without intravenous contrast. COMPARISON:  None. FINDINGS: Brain: There is low-density in the left cerebellum that could be an old or subacute infarction. Cerebral hemispheres show a few old small vessel insults within the thalami, basal ganglia and cerebral hemispheric white matter. No sign of large vessel territory infarction. No mass, hemorrhage, hydrocephalus or extra-axial collection. Vascular: No abnormal vascular finding. Skull: Negative Sinuses/Orbits: Sinuses are clear.  Orbits are negative. Other: Left mastoid effusion. ASPECTS [facility] An Stroke Program Early CT Score) - Ganglionic level infarction (caudate, lentiform nuclei, internal capsule, insula, M1-M3 cortex):  7 - Supraganglionic infarction (M4-M6 cortex): 3 Total score (0-10 with 10 being normal): 10 IMPRESSION: 1. No acute infarction identified. 2. 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 Old small vessel infarctions of the thalami, basal ganglia and hemispheric white matter. 3. ASPECTS is 10. 4. These results were called by telephone at the time of interpretation on 05/16/2021 at 6:53 pm to provider [facility] , who verbally acknowledged these  results. Electronically Signed   By: Paulina Fusi M.D.   On: 05/16/2021 18:55    Micro Results   Recent Results (from the past 240 hour(s))  Resp Panel by RT-PCR (Flu A&B, Covid) Nasopharyngeal Swab     Status: None   Collection Time: 05/16/21  7:35 PM   Specimen: Nasopharyngeal Swab; Nasopharyngeal(NP) swabs in vial transport medium  Result Value Ref Range Status   SARS Coronavirus 2 by RT PCR NEGATIVE NEGATIVE Final    Comment: (NOTE) SARS-CoV-2 target nucleic acids are NOT DETECTED.  The SARS-CoV-2 RNA is generally detectable in upper respiratory specimens during the acute phase of infection. The lowest concentration of SARS-CoV-2 viral copies this assay can detect is 138 copies/mL. A negative result does not preclude SARS-Cov-2 infection and should not be used as the sole basis for treatment or other patient management decisions. A negative result may occur with  improper specimen collection/handling, submission of specimen other than nasopharyngeal swab, presence of viral mutation(s) within the areas targeted by this assay, and inadequate number of viral copies(<138 copies/mL). A negative result must be combined with clinical observations, patient history, and epidemiological information. The expected result is Negative.  Fact Sheet for Patients:  BloggerCourse.com  Fact Sheet for Healthcare Providers:  SeriousBroker.it  This test is no t yet approved or cleared by the [place] FDA and  has been authorized for detection and/or diagnosis of SARS-CoV-2 by FDA under an Emergency Use Authorization (EUA). This EUA will remain  in effect (meaning this test can be used) for the duration of the COVID-19 declaration under Section 564(b)(1) of the Act, 21 U.S.C.section 360bbb-3(b)(1), unless the authorization is terminated  or revoked sooner.       Influenza A by PCR NEGATIVE NEGATIVE Final   Influenza B by PCR NEGATIVE  NEGATIVE Final    Comment: (NOTE) The Xpert Xpress SARS-CoV-2/FLU/RSV plus assay is intended as an aid in the diagnosis of influenza from Nasopharyngeal swab specimens and should not be used as a sole basis for treatment. Nasal washings and aspirates are unacceptable for Xpert Xpress SARS-CoV-2/FLU/RSV testing.  Fact Sheet for Patients: BloggerCourse.com  Fact Sheet for Healthcare Providers: SeriousBroker.it  This test is not yet approved or cleared by the [place] FDA and has been authorized for detection and/or diagnosis of SARS-CoV-2 by FDA under an Emergency Use Authorization (EUA). This EUA will remain in effect (meaning this test can be used) for the duration of the COVID-19 declaration under Section 564(b)(1) of the Act, 21 U.S.C. section 360bbb-3(b)(1), unless the authorization is terminated or revoked.  Performed at [facility], [address] [place], [place] [address]        Today   Subjective    Brandon French today has no new complaints  -Speech, swallowing and feeding difficulties persist          Patient has been seen and examined prior to discharge   Objective   Blood pressure (!) 146/89, pulse 92, temperature 98 F (36.7 C), resp. rate 19, height 6' (1.829 m), weight (!) 145.1 kg, SpO2 95 %.  Intake/Output Summary (Last 24 hours) at 05/20/2021 1149 Last data filed at 05/20/2021 0554 Gross per 24 hour  Intake 2079.43 ml  Output 2000 ml  Net 79.43 ml    Exam Gen:- Awake Alert, morbidly obese, in no acute distress HEENT:- [place].AT, No sclera icterus Neck-Supple Neck,No JVD,.  Lungs-  CTAB , fair air movement CV- S1, S2 normal, RRR Abd-  +ve B.Sounds, Abd Soft, No tenderness, increased truncal adiposity Extremity/Skin:- No  edema,   good pedal pulses Psych-affect is appropriate, oriented x3 Neuro-old/residual left-sided weakness, now with some new right-sided weakness and unsteady gait , no  tremors --Significant speech and swallowing difficulties persist     Data Review   CBC w Diff:  Lab Results  Component Value Date   WBC 8.1 05/19/2021   HGB 12.3 (L) 05/19/2021   HCT 40.8 05/19/2021   PLT 297 05/19/2021   LYMPHOPCT 29 05/16/2021   MONOPCT 9 05/16/2021   EOSPCT 3 05/16/2021   BASOPCT 1 05/16/2021    CMP:  Lab Results  Component Value Date   NA 139 05/19/2021   K 3.5 05/19/2021   CL 108 05/19/2021   CO2 22 05/19/2021   BUN 25 (H) 05/19/2021   CREATININE 1.90 (H) 05/19/2021   PROT 7.1 05/17/2021   ALBUMIN 3.3 (L) 05/19/2021   BILITOT 0.6 05/17/2021   ALKPHOS 51 05/17/2021   AST 17 05/17/2021   ALT 15 05/17/2021  .   Total Discharge time is about 33 minutes  Shon Hale M.D on 05/20/2021 at 11:49 AM  Go to [email or web address] -  for contact info  [facility] - Office  [phone]

## 2021-05-20 NOTE — Progress Notes (Signed)
Marcello Fennel, MD   Physician  Physical Medicine and Rehabilitation  PMR Pre-admission      Signed  Date of Service:  05/19/2021  6:21 PM       Related encounter: ED to Hosp-Admission (Discharged) from 05/16/2021 in [facility] SURGICAL UNIT       Signed      Show:Clear all [x] Written[x] Templated[] Copied  Added by: [x] Standley Brooking, RN[x] Marcello Fennel, MD  [] Hover for details                                                                                                                                                                                                                                                                                                                                                                                                                               PMR Admission Coordinator Pre-Admission Assessment   Patient: Brandon French is an 55 y.o., male MRN: [id number] DOB: 07-Apr-1966 Height: 6' (182.9 cm) Weight: (!) 145.1 kg   Insurance Information HMO: yes    PPO:      PCP:      IPA:      80/20:      OTHER:  PRIMARY: [employer]      Policy#: [id number]      Subscriber: pt CM Name: Cassie      Phone#: [phone] ext [phone]     Fax#: [phone] Pre-Cert#: [id number]   approved for 7 days and  f/u with Dennie Bible at ext [phone]   Employer:  Benefits:  Phone #: [phone]     Name: 10/20 Eff. Date: 08/31/2020     Deduct: none      Out of Pocket Max: $3900      Life Max: none CIR: $295 copay per day days 1 until 6      SNF: no copay days 1 until 20; then $178 co pay per day days 21 until 100 Outpatient: $10 to $20 per visit     Co-Pay: visits per medical neccesity Home Health: 100%      Co-Pay: visits per medical neccesity DME: 80%     Co-Pay: 20% Providers: in network  SECONDARY: none      Policy#:       Phone#:    Artist:       Phone#:    The Data processing manager" for patients in Inpatient Rehabilitation Facilities with attached "Privacy Act Statement-Health Care Records" was provided and verbally reviewed with: Family   Emergency Contact Information Contact Information       Name Relation Home Work [place] Father [phone]   [phone]    Pickard,Ernestine Mother [phone]   [phone]           Current Medical History  Patient Admitting Diagnosis: CVA   History of Present Illness: 55 year old male with history of DM type 2, HTN, HLD and previous CVA. Presented on 05/16/2021 with weakness and slurred speech. 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 Neurology consulted.    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 Also showed multiple remote no hemorrhagic lacunar infarcts. Pronounced speech and swallow difficulties. 2 d echo with EF 65 to 70%, cannot rule out hypertrophic cardiomyopathy. May benefit from outpatient cardiac MRI. LDL 139, Hgb A1c 6.6. Recommend continue asa , plavix and Lipitor.,    Speech evaluated for dysphagia with possible need for PEG if does not resolve. To begin Dysphagia 1 diet with honey thick liquids. Meds crushed with puree. MBS 10/19.    HTN and allowing for permissive hypertension, continue to hold Catapres, HCTZ and metoprolol. Added amlodipine. CKD stage IIIB, last known creat 1.9-2 around 2 years ago. DM holding metformin. Use Novolog /Humalog sliding scale with CBGS. Heparin for DVT prophylaxis.    Complete NIHSS TOTAL: 6   Patient's medical record from [facility] has been reviewed by the rehabilitation admission coordinator and physician.   Past Medical History  History reviewed. No pertinent past medical history.   Has  the patient had major surgery during 100 days prior to admission? No   Family History   family history is not on file.   Current Medications   Current Facility-Administered Medications:    0.9 %  sodium chloride infusion, , Intravenous, Continuous, Emokpae, Courage, MD, Last Rate: 50 mL/hr at 05/20/21 0554, Infusion Verify at 05/20/21 0554   acetaminophen (TYLENOL) tablet 650 mg, 650 mg, Oral, Q4H PRN **OR** acetaminophen (TYLENOL) 160 MG/5ML solution 650 mg, 650 mg, Per Tube, Q4H PRN **OR** acetaminophen (TYLENOL) suppository 650 mg, 650 mg, Rectal, Q4H PRN, Zierle-Ghosh, Asia B, DO   amLODipine (NORVASC) tablet 5 mg, 5 mg, Oral, Daily, Emokpae, Courage, MD, 5 mg at 05/20/21 0912   aspirin EC tablet 81 mg, 81 mg, Oral, Q breakfast, Emokpae, Courage, MD, 81 mg at 05/20/21  0853   atorvastatin (LIPITOR) tablet 40 mg, 40 mg, Oral, Daily, Zierle-Ghosh, Asia B, DO, 40 mg at 05/20/21 0912   clopidogrel (PLAVIX) tablet 75 mg, 75 mg, Per NG tube, Daily, Zierle-Ghosh, Asia B, DO, 75 mg at 05/20/21 0912   heparin injection 5,000 Units, 5,000 Units, Subcutaneous, Q8H, Zierle-Ghosh, Asia B, DO, 5,000 Units at 05/20/21 0555   insulin aspart (novoLOG) injection 0-5 Units, 0-5 Units, Subcutaneous, QHS, Zierle-Ghosh, Asia B, DO   insulin aspart (novoLOG) injection 0-9 Units, 0-9 Units, Subcutaneous, TID WC, Zierle-Ghosh, Asia B, DO, 1 Units at 05/17/21 1139   labetalol (NORMODYNE) injection 10 mg, 10 mg, Intravenous, Q4H PRN, Emokpae, Courage, MD   pantoprazole (PROTONIX) EC tablet 40 mg, 40 mg, Oral, Daily, Emokpae, Courage, MD, 40 mg at 05/20/21 0919   Patients Current Diet:  Diet Order                  Diet - low sodium heart healthy             DIET - DYS 1 Room service appropriate? Yes; Fluid consistency: Honey Thick  Diet effective now                       Precautions / Restrictions Precautions Precautions: Fall Restrictions Weight Bearing Restrictions: No    Has the patient had 2 or  more falls or a fall with injury in the past year? No   Prior Activity Level Community (5-7x/wk): Independent and driving; H/O CVA in 7371   Prior Functional Level Self Care: Did the patient need help bathing, dressing, using the toilet or eating? Independent   Indoor Mobility: Did the patient need assistance with walking from room to room (with or without device)? Independent   Stairs: Did the patient need assistance with internal or external stairs (with or without device)? Independent   Functional Cognition: Did the patient need help planning regular tasks such as shopping or remembering to take medications? Independent   Patient Information Are you of Hispanic, Latino/a,or Spanish origin?: A. No, not of Hispanic, Latino/a, or Spanish origin What is your race?: B. Black or African American Do you need or want an interpreter to communicate with a doctor or health care staff?: 0. No   Patient's Response To:  Health Literacy and Transportation Is the patient able to respond to health literacy and transportation needs?: Yes Health Literacy - How often do you need to have someone help you when you read instructions, pamphlets, or other written material from your doctor or pharmacy?: Never In the past 12 months, has lack of transportation kept you from medical appointments or from getting medications?: No In the past 12 months, has lack of transportation kept you from meetings, work, or from getting things needed for daily living?: No   Home Assistive Devices / Equipment Home Assistive Devices/Equipment: Medical laboratory scientific officer (specify quad or straight) Home Equipment: Walker - 2 wheels, Cane - single point   Prior Device Use: Indicate devices/aids used by the patient prior to current illness, exacerbation or injury?  Cane in the community   Current Functional Level Cognition   Arousal/Alertness: Awake/alert Overall Cognitive Status: Within Functional Limits for tasks assessed Orientation Level:  Oriented X4 Memory: Appears intact Awareness: Appears intact Problem Solving: Appears intact Safety/Judgment: Appears intact    Extremity Assessment (includes Sensation/Coordination)   Upper Extremity Assessment: Defer to OT evaluation LUE Deficits / Details: 3-/5 shoulder flexion and abduction; 4/5 elbow extention, 4+/5 elbow flexion, 3+/5 wrist  extension and flexion, 4-/5 grip. Pt reports deficits at baseline from previous stroke, but stated that it is slighlty worse than usual. LUE Sensation: WNL LUE Coordination: WNL  Lower Extremity Assessment: Overall WFL for tasks assessed, LLE deficits/detail LLE Deficits / Details: grossly -4/5 except ankle dorsiflexion 3/5 LLE Sensation: WNL LLE Coordination: decreased fine motor     ADLs   Overall ADL's : Needs assistance/impaired Eating/Feeding:  (NG tube.) Grooming: Standing, Minimal assistance, Min guard, Wash/dry face, Sitting, Set up, Applying deodorant, Supervision/safety Grooming Details (indicate cue type and reason): Per clinical judgement for standing. Sitting pt was able to wash face with s/u assist. SPV assist to open deodorant. Upper Body Bathing: Set up, Sitting Upper Body Bathing Details (indicate cue type and reason): Per clinical judgement. Lower Body Bathing: Minimal assistance, Sitting/lateral leans Lower Body Bathing Details (indicate cue type and reason): Per clinical judgement. Upper Body Dressing : Set up, Sitting Upper Body Dressing Details (indicate cue type and reason): Per clinical judgement. Lower Body Dressing: Supervision/safety, Modified independent, Sitting/lateral leans Lower Body Dressing Details (indicate cue type and reason): Extended time with labored movement for pt to doff and don R sock seated in chair this date. Toilet Transfer: RW, Stand-pivot, Minimal assistance, Moderate assistance Toilet Transfer Details (indicate cue type and reason): Simulated via EOB to chair transfer. Toileting- Designer, fashion/clothing and Hygiene: Min guard, Minimal assistance, Sit to/from stand Toileting - Clothing Manipulation Details (indicate cue type and reason): Per clinical judgement. Tub/ Shower Transfer: Minimal assistance, Moderate assistance, Rolling walker Tub/Shower Transfer Details (indicate cue type and reason): Per clinical judgement. Functional mobility during ADLs: Minimal assistance, Rolling walker, Moderate assistance (Mod A for sit to stand.) General ADL Comments: Slow labored movement with RW. Unsteady on feet.     Mobility   Overal bed mobility: Needs Assistance Bed Mobility: Supine to Sit Supine to sit: Min assist Sit to supine: Min assist General bed mobility comments: Presents up in chair     Transfers   Overall transfer level: Needs assistance Equipment used: Rolling walker (2 wheeled) Transfers: Sit to/from Stand, [employer] Transfers Sit to Stand: Min assist Stand pivot transfers: Min assist General transfer comment: increased time, labored movement     Ambulation / Gait / Stairs / Wheelchair Mobility   Ambulation/Gait Ambulation/Gait assistance: Min guard, Editor, commissioning (Feet): 55 Feet Assistive device: Rolling walker (2 wheeled) Gait Pattern/deviations: Decreased step length - right, Decreased step length - left, Decreased stride length, Decreased dorsiflexion - left, Steppage General Gait Details: slow labored cadence with decreased left dorsiflexion with mild steppage gait on left foot, no loss of balance, limited mostly due to fatigue Gait velocity: decreased     Posture / Balance Dynamic Sitting Balance Sitting balance - Comments: seated in chair Balance Overall balance assessment: Needs assistance Sitting-balance support: Feet supported, No upper extremity supported Sitting balance-Leahy Scale: Good Sitting balance - Comments: seated in chair Standing balance support: During functional activity, Bilateral upper extremity supported Standing  balance-Leahy Scale: Fair Standing balance comment: using RW     Special needs/care consideration May need Cortrak placement    Previous Home Environment  Living Arrangements:  (Lives with his parents for 2 years when moved from [place])  Lives With: Family Available Help at Discharge: Available 24 hours/day Type of Home: House Home Layout: One level Home Access: Ramped entrance (ramp to porch then 1 step into home) Entrance Stairs-Rails: Right Entrance Stairs-Number of Steps: 3 Bathroom Shower/Tub: Engineer, manufacturing systems: Handicapped height Bathroom  Accessibility: Yes Home Care Services: No   Discharge Living Setting Plans for Discharge Living Setting: Lives with (comment) (parents) Type of Home at Discharge: House Discharge Home Layout: One level Discharge Home Access: Ramped entrance (ramp to porch then 1 step in) Discharge Bathroom Shower/Tub: Tub/shower unit Discharge Bathroom Toilet: Handicapped height Discharge Bathroom Accessibility: Yes How Accessible: Accessible via walker Does the patient have any problems obtaining your medications?: No   Social/Family/Support Systems Patient Roles: Parent (has 2 adult children in [place]) Contact Information: Dad, Greggory Stallion Anticipated Caregiver: parents Anticipated Caregiver's Contact Information: [phone] Ability/Limitations of Caregiver: parents in their late 40s Caregiver Availability: 24/7 Discharge Plan Discussed with Primary Caregiver: Yes Is Caregiver In Agreement with Plan?: Yes Does Caregiver/Family have Issues with Lodging/Transportation while Pt is in Rehab?: No   His Dad is on hemodialysis   Goals Patient/Family Goal for Rehab: Mod I to supervision with PT, OT and SLP Expected length of stay: ELOS 10 to 12 days Pt/Family Agrees to Admission and willing to participate: Yes Program Orientation Provided & Reviewed with Pt/Caregiver Including Roles  & Responsibilities: Yes   Decrease burden of Care  through IP rehab admission: n/a   Possible need for SNF placement upon discharge: not anticipated   Patient Condition: I have reviewed medical records from [facility] , spoken with CM, and family member. I discussed via phone for inpatient rehabilitation assessment.  Patient will benefit from ongoing PT, OT, and SLP, can actively participate in 3 hours of therapy a day 5 days of the week, and can make measurable gains during the admission.  Patient will also benefit from the coordinated team approach during an Inpatient Acute Rehabilitation admission.  The patient will receive intensive therapy as well as Rehabilitation physician, nursing, social worker, and care management interventions.  Due to bladder management, bowel management, safety, skin/wound care, disease management, medication administration, pain management, and patient education the patient requires 24 hour a day rehabilitation nursing.  The patient is currently mod assist overall with mobility and basic ADLs.  Discharge setting and therapy post discharge at home with home health is anticipated.  Patient has agreed to participate in the Acute Inpatient Rehabilitation Program and will admit today.   Preadmission Screen Completed By:  Clois Dupes, 05/20/2021 12:00 PM ______________________________________________________________________   Discussed status with Dr. Allena Katz  on  10/21 22 at 1201 and received approval for admission today.   Admission Coordinator:  Clois Dupes, RN, time  6144 Date  05/20/2021    Assessment/Plan: Diagnosis: CVA Does the need for close, 24 hr/day Medical supervision in concert with the patient's rehab needs make it unreasonable for this patient to be served in a less intensive setting? Yes Co-Morbidities requiring supervision/potential complications: DM type 2, HTN, HLD and previous CVA Due to safety, disease management, and patient education, does the patient require 24 hr/day  rehab nursing? Yes Does the patient require coordinated care of a physician, rehab nurse, PT, OT, and SLP to address physical and functional deficits in the context of the above medical diagnosis(es)? Yes Addressing deficits in the following areas: balance, endurance, locomotion, strength, transferring, bathing, dressing, toileting, and psychosocial support Can the patient actively participate in an intensive therapy program of at least 3 hrs of therapy 5 days a week? Yes The potential for patient to make measurable gains while on inpatient rehab is good Anticipated functional outcomes upon discharge from inpatient rehab: modified independent and supervision PT, modified independent and supervision OT, independent SLP Estimated rehab length  of stay to reach the above functional goals is: 7-10 days. Anticipated discharge destination: Home 10. Overall Rehab/Functional Prognosis: excellent     MD Signature: Maryla Morrow, MD, ABPMR         Revision History                          Note Details  Author Marcello Fennel, MD File Time 05/20/2021 12:11 PM  Author Type Physician Status Signed  Last Editor Marcello Fennel, MD Service Physical Medicine and Rehabilitation  [facility] Acct # [id number] Admit Date 05/20/2021

## 2021-05-20 NOTE — Progress Notes (Signed)
Inpatient Rehabilitation Admission Medication Review by a Pharmacist  A complete drug regimen review was completed for this patient to identify any potential clinically significant medication issues.  High Risk Drug Classes Is patient taking? Indication by Medication  Antipsychotic Yes PRN compazine for N/V Trazodone for sleep  Anticoagulant Yes Lovenox for DVT px  Antibiotic No   Opioid No   Antiplatelet Yes ASA/Plavix for stroke  Hypoglycemics/insulin Yes SSI for DM  Vasoactive Medication Yes amlodipine for HTN  Chemotherapy No   Other Yes Ator for HLD/CVA Protonix for reflux      Type of Medication Issue Identified Description of Issue Recommendation(s)  Drug Interaction(s) (clinically significant)     Duplicate Therapy     Allergy     No Medication Administration End Date     Incorrect Dose     Additional Drug Therapy Needed  clonidine, finasteride, glimepiride, Toprol, flomax Resume once stable  Significant med changes from prior encounter (inform family/care partners about these prior to discharge).    Other       Clinically significant medication issues were identified that warrant physician communication and completion of prescribed/recommended actions by midnight of the next day:  Yes  Name of provider notified for urgent issues identified: Marissa Nestle, PA  Provider Method of Notification: Direct    Pharmacist comments: Continue to hold clonidine, finasteride, glimepiride, Toprol, flomax until can swallow per Marissa Nestle, PA  Time spent performing this drug regimen review (minutes):  20  Ulyses Southward, PharmD, [place], AAHIVP, CPP Infectious Disease Pharmacist 05/20/2021 3:40 PM

## 2021-05-20 NOTE — Plan of Care (Signed)
Pt alert, discharged to [facility]. Belongings packed and sent with pt. Report called to [facility] and given to [place], [place]. Pt left hospital with no distress noted.  Problem: Nutrition: Goal: Risk of aspiration will decrease Outcome: Adequate for Discharge   Problem: Education: Goal: Knowledge of secondary prevention will improve Outcome: Adequate for Discharge   Problem: Ischemic Stroke/TIA Tissue Perfusion: Goal: Complications of ischemic stroke/TIA will be minimized Outcome: Adequate for Discharge   Problem: Education: Goal: Knowledge of General Education information will improve Description: Including pain rating scale, medication(s)/side effects and non-pharmacologic comfort measures Outcome: Adequate for Discharge   Problem: Health Behavior/Discharge Planning: Goal: Ability to manage health-related needs will improve Outcome: Adequate for Discharge   Problem: Clinical Measurements: Goal: Ability to maintain clinical measurements within normal limits will improve Outcome: Adequate for Discharge Goal: Will remain free from infection Outcome: Adequate for Discharge Goal: Diagnostic test results will improve Outcome: Adequate for Discharge Goal: Respiratory complications will improve Outcome: Adequate for Discharge Goal: Cardiovascular complication will be avoided Outcome: Adequate for Discharge   Problem: Activity: Goal: Risk for activity intolerance will decrease Outcome: Adequate for Discharge   Problem: Nutrition: Goal: Adequate nutrition will be maintained Outcome: Adequate for Discharge   Problem: Coping: Goal: Level of anxiety will decrease Outcome: Adequate for Discharge   Problem: Elimination: Goal: Will not experience complications related to bowel motility Outcome: Adequate for Discharge Goal: Will not experience complications related to urinary retention Outcome: Adequate for Discharge   Problem: Pain Managment: Goal: General experience of comfort will  improve Outcome: Adequate for Discharge   Problem: Safety: Goal: Ability to remain free from injury will improve Outcome: Adequate for Discharge   Problem: Skin Integrity: Goal: Risk for impaired skin integrity will decrease Outcome: Adequate for Discharge

## 2021-05-20 NOTE — Progress Notes (Signed)
Ok to increase Lovenox to 0.5mg /kg/day for DVT prophylaxis due to BMI 44 per Marissa Nestle, PA.  Ulyses Southward, PharmD, BCIDP, AAHIVP, CPP Infectious Disease Pharmacist 05/20/2021 3:51 PM

## 2021-05-20 NOTE — H&P (Signed)
Physical Medicine and Rehabilitation Admission H&P    Chief Complaint  Patient presents with   Stroke with functional deficits.     HPI: Brandon French is a 55 year old RH-male with history of HTN, T2DM, BPH?, who was admitted to [facility] on 05/16/2021 after waking up with slurred speech and left hemiparesis. He was drooling and reported to have trouble swallowing. 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  Echocardiogram with EF of EF 65-70% and sevvere asymmetric left ventricular hypertrophy of septal segment--cardiology referral recommended.  Dr. Gerilyn Pilgrim consulted for input and recommended aspirin/Plavix for 1 month followed by Plavix alone.  NPO. recommended due to severe dysphagia with inability to handle secretions.  MBS done on showing severe oral effusion mild to moderate pharyngeal phase dysphagia with penetration and aspiration of thin and nectar and honey liquids.  He was able to tolerate honey thick by teaspoon and needed verbal cues to try and engage on phase.  PEG tube was recommended due to concerns of ability to maintain adequate nutrition.  Therapy ongoing and patient limited by left-sided hemiparesis with balance deficits and fatigue.  CIR was recommended due to functional decline. Please see preadmission assessment from earlier today as well.   Review of Systems  Constitutional:  Negative for chills, diaphoresis, fever and malaise/fatigue.  Eyes:        Wear glasses for driving  Respiratory:  Positive for wheezing. Negative  for cough, shortness of breath and stridor.   Cardiovascular:  Negative for chest pain and leg swelling.  Gastrointestinal:  Negative for constipation and heartburn.  Genitourinary:  Negative for dysuria and urgency.  Musculoskeletal:  Positive for myalgias (left thigh/calf pain). Negative for back pain.  Neurological:  Positive for speech change, focal weakness and weakness. Negative for dizziness.  Psychiatric/Behavioral:  The patient has insomnia (chronic).   All other systems reviewed and are negative.   Past Medical History:  Diagnosis Date   Diabetes mellitus (HCC)    Elevated cholesterol    High blood pressure     No past surgical history.    Family History  Problem Relation Age of Onset   Diabetes Mother    Cancer Mother    Diabetes Father     Social History: Lives with parents. He  reports that he used to smoke cigarettes--quit 10 years ago.  He has never used smokeless tobacco. He reports that he does not drink alcohol and does not use drugs.   Allergies  Allergen Reactions   Aleve [Naproxen]    Medications Prior to Admission  Medication Sig Dispense Refill   acetaminophen (TYLENOL) 325 MG tablet Take 2 tablets (650 mg total) by mouth every 4 (four) hours as needed for mild pain (or temp > 37.5 C (99.5 F)). 30 tablet 0   [START ON 05/21/2021] amLODipine (NORVASC) 5 MG tablet Take 1 tablet (5 mg total) by mouth daily. 90 tablet 3   [START ON 05/21/2021] aspirin EC 81 MG EC tablet Take 1 tablet (81 mg total) by mouth daily with breakfast. Please take Aspirin 81 mg daily along with Plavix 75 mg daily for 30  days then after that STOP the aspirin and continue ONLY Plavix 75 mg daily indefinitely--for stroke prevention 30 tablet 11   atorvastatin (LIPITOR) 40 MG tablet Take 1 tablet (40 mg total) by mouth daily. For Stroke Prevention 90 tablet 3   cloNIDine (CATAPRES) 0.1 MG tablet Take 1 tablet (0.1 mg total) by mouth 2 (two) times daily. 60 tablet 11   [START ON  05/21/2021] clopidogrel (PLAVIX) 75 MG tablet 1 tablet (75 mg total) by Per NG tube route daily. Please take Aspirin 81 mg daily along with Plavix 75 mg daily for 30 days then after that STOP the aspirin and continue ONLY Plavix 75 mg daily indefinitely--for stroke prevention 90 tablet 3   finasteride (PROSCAR) 5 MG tablet Take 1 tablet (5 mg total) by mouth daily. 90 tablet 3   glimepiride (AMARYL) 2 MG tablet Take 1 tablet (2 mg total) by mouth daily with breakfast. 30 tablet 11   metoprolol succinate (TOPROL-XL) 100 MG 24 hr tablet Take 1 tablet (100 mg total) by mouth daily. 90 tablet 3   [START ON 05/21/2021] pantoprazole (PROTONIX) 40 MG tablet Take 1 tablet (40 mg total) by mouth daily. 30 tablet 2   tamsulosin (FLOMAX) 0.4 MG CAPS capsule Take 1 capsule (0.4 mg total) by mouth daily. 90 capsule 3    Drug Regimen Review  Drug regimen was reviewed and remains appropriate with no significant issues identified  Home: Home Living Family/patient expects to be discharged to:: Private residence Living Arrangements:  (Lives with his parents for 2 years when moved from [place]) Available Help at Discharge: Available 24 hours/day Type of Home: House Home Access: Ramped entrance (ramp to porch then 1 step into home) Entrance Stairs-Number of Steps: 3 Entrance Stairs-Rails: Right Home Layout: One level Bathroom Shower/Tub: Engineer, manufacturing systems: Handicapped height Bathroom Accessibility: Yes Home Equipment: Environmental consultant - 2 wheels, Cane - single point  Lives With: Family   Functional History: Prior Function Level of Independence: Independent with assistive device(s) Comments: Retail buyer SPC, drives, shops  Functional Status:  Mobility: Bed Mobility Overal bed mobility: Needs Assistance Bed Mobility: Supine to Sit Supine to sit: Min assist Sit to supine: Min assist General bed mobility comments: Presents up in chair Transfers Overall transfer level: Needs  assistance Equipment used: Rolling walker (2 wheeled) Transfers: Sit to/from Stand, [employer] Transfers Sit to Stand: Min assist Stand pivot transfers: Min assist General transfer comment: increased time, labored movement Ambulation/Gait Ambulation/Gait assistance: Min guard, Min assist Gait Distance (Feet): 55 Feet Assistive device: Rolling walker (2 wheeled) Gait Pattern/deviations: Decreased step length - right, Decreased step length - left, Decreased stride length, Decreased dorsiflexion - left, Steppage General Gait Details: slow labored cadence with decreased left dorsiflexion with mild steppage gait on left foot, no loss of balance, limited mostly due to fatigue Gait velocity: decreased    ADL: ADL Overall ADL's : Needs assistance/impaired Eating/Feeding:  (NG tube.) Grooming: Standing, Minimal assistance, Min guard, Wash/dry face, Sitting, Set up, Applying deodorant, Supervision/safety Grooming Details (indicate cue type and reason): Per clinical judgement for standing. Sitting pt was able to wash face with s/u assist. SPV assist to open deodorant. Upper Body Bathing: Set up, Sitting Upper Body Bathing Details (indicate cue type and reason): Per clinical judgement. Lower Body Bathing: Minimal assistance, Sitting/lateral leans Lower Body Bathing Details (indicate cue type and reason): Per clinical judgement. Upper Body Dressing : Set up, Sitting Upper Body Dressing Details (indicate cue type and reason): Per clinical judgement. Lower  Body Dressing: Supervision/safety, Modified independent, Sitting/lateral leans Lower Body Dressing Details (indicate cue type and reason): Extended time with labored movement for pt to doff and don R sock seated in chair this date. Toilet Transfer: RW, Stand-pivot, Minimal assistance, Moderate assistance Toilet Transfer Details (indicate cue type and reason): Simulated via EOB to chair transfer. Toileting- Architect and Hygiene: Min  guard, Minimal assistance, Sit to/from stand Toileting - Clothing Manipulation Details (indicate cue type and reason): Per clinical judgement. Tub/ Shower Transfer: Minimal assistance, Moderate assistance, Rolling walker Tub/Shower Transfer Details (indicate cue type and reason): Per clinical judgement. Functional mobility during ADLs: Minimal assistance, Rolling walker, Moderate assistance (Mod A for sit to stand.) General ADL Comments: Slow labored movement with RW. Unsteady on feet.  Cognition: Cognition Overall Cognitive Status: Within Functional Limits for tasks assessed Arousal/Alertness: Awake/alert Orientation Level: Oriented X4 Year: 2022 Month: October Day of Week: Correct Memory: Appears intact Awareness: Appears intact Problem Solving: Appears intact Safety/Judgment: Appears intact Cognition Arousal/Alertness: Awake/alert Behavior During Therapy: WFL for tasks assessed/performed Overall Cognitive Status: Within Functional Limits for tasks assessed   Blood pressure (!) 146/89, pulse 92, temperature 98 F (36.7 C), resp. rate 19, height 6' (1.829 m), weight (!) 145.1 kg, SpO2 95 %. Physical Exam Vitals and nursing note reviewed.  Constitutional:      General: He is not in acute distress.    Appearance: Normal appearance. He is obese.  HENT:     Head: Normocephalic and atraumatic.     Comments: Poor dentition    Right Ear: External ear normal.     Left Ear: External ear normal.     Nose: Nose normal.  Eyes:     General:        Right eye: No discharge.        Left eye: No discharge.     Extraocular Movements: Extraocular movements intact.  Cardiovascular:     Rate and Rhythm: Normal rate and regular rhythm.  Pulmonary:     Effort: Pulmonary effort is normal. No respiratory distress.     Breath sounds: Examination of the left-upper field reveals rales. Rales present.  Abdominal:     General: Abdomen is flat. Bowel sounds are normal. There is no distension.   Musculoskeletal:     Cervical back: Normal range of motion and neck supple.     Comments: No edema in extremities LLE with some pain with manipulation  Skin:    General: Skin is warm and dry.  Neurological:     Mental Status: He is alert and oriented to person, place, and time.     Comments: Alert Severe dysarthria Able to answer biographic questions.  He was able to follow simple motor commands.  Sensation diminished to light touch LLE Motor: RUE/RLE; 5/5 proximal to distal LUE: 4/5 proximal to distal LLE: 3-/5 proximal to distal  Psychiatric:        Mood and Affect: Mood normal.        Behavior: Behavior normal.    Results for orders placed or performed during the hospital encounter of 05/16/21 (from the past 48 hour(s))  Glucose, capillary     Status: Abnormal   Collection Time: 05/18/21  4:08 PM  Result Value Ref Range   Glucose-Capillary 114 (H) 70 - 99 mg/dL    Comment: Glucose reference range applies only to samples taken after fasting for at least 8 hours.  Glucose, capillary     Status: None   Collection Time: 05/18/21  8:10 PM  Result Value Ref Range   Glucose-Capillary 98 70 - 99 mg/dL    Comment: Glucose reference range applies only to samples taken after fasting for at least 8 hours.  Renal function panel     Status: Abnormal   Collection Time: 05/19/21  6:09 AM  Result Value Ref Range   Sodium 139 135 - 145 mmol/L   Potassium 3.5 3.5 - 5.1 mmol/L   Chloride 108 98 - 111 mmol/L   CO2 22 22 - 32 mmol/L   Glucose, Bld 110 (H) 70 - 99 mg/dL    Comment: Glucose reference range applies only to samples taken after fasting for at least 8 hours.   BUN 25 (H) 6 - 20 mg/dL   Creatinine, Ser 6.76 (H) 0.61 - 1.24 mg/dL   Calcium 8.4 (L) 8.9 - 10.3 mg/dL   Phosphorus 2.9 2.5 - 4.6 mg/dL   Albumin 3.3 (L) 3.5 - 5.0 g/dL   GFR, Estimated 42 (L) >60 mL/min    Comment: (NOTE) Calculated using the CKD-EPI Creatinine Equation (2021)    Anion gap 9 5 - 15    Comment:  Performed at [facility], [address] [place], [place] [address]  CBC     Status: Abnormal   Collection Time: 05/19/21  6:09 AM  Result Value Ref Range   WBC 8.1 4.0 - 10.5 K/uL   RBC 5.13 4.22 - 5.81 MIL/uL   Hemoglobin 12.3 (L) 13.0 - 17.0 g/dL   HCT 32.6 71.2 - 45.8 %   MCV 79.5 (L) 80.0 - 100.0 fL   MCH 24.0 (L) 26.0 - 34.0 pg   MCHC 30.1 30.0 - 36.0 g/dL   RDW 09.9 83.3 - 82.5 %   Platelets 297 150 - 400 K/uL   nRBC 0.0 0.0 - 0.2 %    Comment: Performed at [facility], [address], [place], [place] [address]  Glucose, capillary     Status: Abnormal   Collection Time: 05/19/21  7:33 AM  Result Value Ref Range   Glucose-Capillary 107 (H) 70 - 99 mg/dL    Comment: Glucose reference range applies only to samples taken after fasting for at least 8 hours.  Glucose, capillary     Status: Abnormal   Collection Time: 05/19/21 11:00 AM  Result Value Ref Range   Glucose-Capillary 104 (H) 70 - 99 mg/dL    Comment: Glucose reference range applies only to samples taken after fasting for at least 8 hours.  Glucose, capillary     Status: Abnormal   Collection Time: 05/19/21  4:01 PM  Result Value Ref Range   Glucose-Capillary 123 (H) 70 - 99 mg/dL    Comment: Glucose reference range applies only to samples taken after fasting for at least 8 hours.  Glucose, capillary     Status: Abnormal   Collection Time: 05/19/21  8:10 PM  Result Value Ref Range   Glucose-Capillary 131 (H) 70 - 99 mg/dL    Comment: Glucose reference range applies only to samples taken after fasting for at least 8 hours.  Glucose, capillary     Status: None   Collection Time: 05/20/21  7:05 AM  Result Value Ref Range   Glucose-Capillary 86 70 - 99 mg/dL    Comment: Glucose reference range applies only to samples taken after fasting for at least 8 hours.  Glucose, capillary     Status: Abnormal   Collection Time: 05/20/21 11:13 AM  Result Value Ref Range   Glucose-Capillary 112 (H) 70 -  99 mg/dL    Comment:  Glucose reference range applies only to samples taken after fasting for at least 8 hours.   No results found.     Medical Problem List and Plan: 1.  Deficits with mobility, speech, self-care secondary to acute left brain nonhemorrhagic infarct.   -patient may shower  -ELOS/Goals: Supervision/Mod I with PT/OT and Min A with SLP.  Admit to CIR 2.  Antithrombotics: -DVT/anticoagulation:  Pharmaceutical: Lovenox--will check dopplers due to LLE pain  -antiplatelet therapy: DAPT x1 month followed by Plavix alone. 3. Pain Management: Tylenol as needed. 4. Mood: LCSW to follow for evaluation and support.  -antipsychotic agents: N/A 5. Neuropsych: This patient is not capable of making decisions on his own behalf. 6. Skin/Wound Care: Routine pressure-relief measures. 7. Fluids/Electrolytes/Nutrition: Will monitor intake/output.   CMP ordered for tomorrow AM  -- Will need assistance/encouragement for intake. 8.  HTN: Monitor blood pressures 3 times daily 5-7 days.   --Avoid HTN with intracranial/basilar artery stenosis.  Monitor with increased exertion 9.  T2DM with hyperglycemia: Hgb A1C- 6.6.  Was on metformin prior to admission which is being held due to CKD?  -- Monitor blood sugars achs and use SSI for elevated BS   Monitor with increased activity 10.  CKD stage IIIb: Serum creatinine 1.92 range per records.  -- We will change IV fluids to nocturnal.  CMP ordered 11.  Possible HCOM: Severe asymmetric LV septal hypertrophy per echo  -- Will need referral to cardiology at discharge 12. 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 Will need assistance with fluid intake.  --Will check CXR due to wet sounds/WOB 13. Dyslipidemia: LDL 139. On Crestor.  14. Morbid obesity: Educate on importance of diet/wt loss to promote health and mobility.   15.BPH?: Indicating difficulty with voiding--will resume Proscar and Flomax.  --toilet every 3-4 hours/assist  with urinal   Jacquelynn Cree, PA-C 05/20/2021  I have personally performed a face to face diagnostic evaluation, including, but not limited to relevant history and physical exam findings, of this patient and developed relevant assessment and plan.  Additionally, I have reviewed and concur with the physician assistant's documentation above.  Maryla Morrow, MD, ABPMR

## 2021-05-20 NOTE — Discharge Instructions (Signed)
1)Please take Aspirin 81 mg daily along with Plavix 75 mg daily for 30 days then after that STOP the aspirin and continue ONLY Plavix 75 mg daily indefinitely--for stroke prevention  2)Please follow-up with Neurologist Dr. Beryle Beams-- Phone: [phone], Address: [address], [place], [place] [address] in 4 to 6 weeks for recheck and reevaluation.  Please call to make appointment with him  3) please take all your medications as prescribed to prevent further strokes

## 2021-05-20 NOTE — Progress Notes (Addendum)
Inpatient Rehabilitation Admissions Coordinator   I have insurance approval and CIR bed to admit to today. I have alerted acute team and TOC. I have placed call to Dr Mariea Clonts to clarify if I can plan admit today. We can take patient without PEG and place Cortrak if needed.I await clarification before proceeding.  Ottie Glazier, RN, MSN Rehab Admissions Coordinator [phone] 05/20/2021 10:48 AM  I spoke with pt's Dad by phone and he is aware and in agreement to admit to CIR at [facility] [place] today. Dr Maryla Morrow will be receiving MD and he will be admitted to room 4 west 07  at [facility].  I have called [facility] to request transport at 1330.  Ottie Glazier, RN, MSN Rehab Admissions Coordinator [phone] 05/20/2021 11:56 AM

## 2021-05-20 NOTE — Progress Notes (Signed)
Patient ID: Brandon French, male   DOB: 29-Apr-1966, 55 y.o.   MRN: [id number] Met with the patient to introduce self. Reviewed therapy schedule and plan of care. Notebook with handouts on secondary risk management left in the room for the patient and parents. Mother on her way. Continue to follow along to discharge to address educational needs and secondary risk management tips including DM, HTN, HLD and CKD. Patient with slurred speech but appears to comprehend questions and attempts to answer if not verbally, then with gestures and nods. Margarito Liner

## 2021-05-21 ENCOUNTER — Inpatient Hospital Stay (HOSPITAL_COMMUNITY): Payer: Medicare HMO

## 2021-05-21 DIAGNOSIS — I639 Cerebral infarction, unspecified: Secondary | ICD-10-CM

## 2021-05-21 DIAGNOSIS — I63219 Cerebral infarction due to unspecified occlusion or stenosis of unspecified vertebral arteries: Secondary | ICD-10-CM | POA: Diagnosis not present

## 2021-05-21 LAB — GLUCOSE, CAPILLARY
Glucose-Capillary: 140 mg/dL — ABNORMAL HIGH (ref 70–99)
Glucose-Capillary: 115 mg/dL — ABNORMAL HIGH (ref 70–99)
Glucose-Capillary: 150 mg/dL — ABNORMAL HIGH (ref 70–99)

## 2021-05-21 LAB — CBC WITH DIFFERENTIAL/PLATELET
Abs Immature Granulocytes: 0.02 10*3/uL (ref 0.00–0.07)
Basophils Absolute: 0.1 10*3/uL (ref 0.0–0.1)
Basophils Relative: 1 %
Eosinophils Absolute: 0.2 10*3/uL (ref 0.0–0.5)
Eosinophils Relative: 4 %
HCT: 41.1 % (ref 39.0–52.0)
Hemoglobin: 12.5 g/dL — ABNORMAL LOW (ref 13.0–17.0)
Immature Granulocytes: 0 %
Lymphocytes Relative: 21 %
Lymphs Abs: 1.4 10*3/uL (ref 0.7–4.0)
MCH: 23.9 pg — ABNORMAL LOW (ref 26.0–34.0)
MCHC: 30.4 g/dL (ref 30.0–36.0)
MCV: 78.7 fL — ABNORMAL LOW (ref 80.0–100.0)
Monocytes Absolute: 0.6 10*3/uL (ref 0.1–1.0)
Monocytes Relative: 9 %
Neutro Abs: 4.4 10*3/uL (ref 1.7–7.7)
Neutrophils Relative %: 65 %
Platelets: 337 10*3/uL (ref 150–400)
RBC: 5.22 MIL/uL (ref 4.22–5.81)
RDW: 15.2 % (ref 11.5–15.5)
WBC: 6.7 10*3/uL (ref 4.0–10.5)
nRBC: 0 % (ref 0.0–0.2)

## 2021-05-21 LAB — COMPREHENSIVE METABOLIC PANEL
ALT: 39 U/L (ref 0–44)
AST: 66 U/L — ABNORMAL HIGH (ref 15–41)
Albumin: 2.9 g/dL — ABNORMAL LOW (ref 3.5–5.0)
Alkaline Phosphatase: 49 U/L (ref 38–126)
Anion gap: 5 (ref 5–15)
BUN: 23 mg/dL — ABNORMAL HIGH (ref 6–20)
CO2: 27 mmol/L (ref 22–32)
Calcium: 8.5 mg/dL — ABNORMAL LOW (ref 8.9–10.3)
Chloride: 107 mmol/L (ref 98–111)
Creatinine, Ser: 2.17 mg/dL — ABNORMAL HIGH (ref 0.61–1.24)
GFR, Estimated: 36 mL/min — ABNORMAL LOW (ref >60)
Glucose, Bld: 111 mg/dL — ABNORMAL HIGH (ref 70–99)
Potassium: 4.2 mmol/L (ref 3.5–5.1)
Sodium: 139 mmol/L (ref 135–145)
Total Bilirubin: 1.2 mg/dL (ref 0.3–1.2)
Total Protein: 6.3 g/dL — ABNORMAL LOW (ref 6.5–8.1)

## 2021-05-21 MED ORDER — SODIUM CHLORIDE 0.9 % IV SOLN: INTRAVENOUS | Status: AC

## 2021-05-21 MED ORDER — AMLODIPINE BESYLATE 10 MG PO TABS
10.0000 mg | ORAL_TABLET | Freq: Every day | ORAL | Status: DC
  Administered 2021-05-22 – 2021-06-07 (×17): 10 mg via ORAL
  Filled 2021-05-21 (×17): qty 1

## 2021-05-21 MED ORDER — HYDRALAZINE HCL 10 MG PO TABS
10.0000 mg | ORAL_TABLET | Freq: Four times a day (QID) | ORAL | Status: DC | PRN
  Administered 2021-05-21 – 2021-05-22 (×2): 10 mg via ORAL
  Filled 2021-05-21 (×2): qty 1

## 2021-05-21 MED ORDER — PROSOURCE PLUS PO LIQD
30.0000 mL | Freq: Two times a day (BID) | ORAL | Status: DC
  Administered 2021-05-21 – 2021-05-26 (×6): 30 mL via ORAL
  Filled 2021-05-21 (×6): qty 30

## 2021-05-21 NOTE — Plan of Care (Signed)
  Problem: RH Balance Goal: LTG Patient will maintain dynamic sitting balance (PT) Description: LTG:  Patient will maintain dynamic sitting balance with assistance during mobility activities (PT) Flowsheets (Taken 05/21/2021 1543) LTG: Pt will maintain dynamic sitting balance during mobility activities with:: Independent Goal: LTG Patient will maintain dynamic standing balance (PT) Description: LTG:  Patient will maintain dynamic standing balance with assistance during mobility activities (PT) Flowsheets (Taken 05/21/2021 1543) LTG: Pt will maintain dynamic standing balance during mobility activities with:: Supervision/Verbal cueing   Problem: Sit to Stand Goal: LTG:  Patient will perform sit to stand with assistance level (PT) Description: LTG:  Patient will perform sit to stand with assistance level (PT) Flowsheets (Taken 05/21/2021 1543) LTG: PT will perform sit to stand in preparation for functional mobility with assistance level: Supervision/Verbal cueing   Problem: RH Bed Mobility Goal: LTG Patient will perform bed mobility with assist (PT) Description: LTG: Patient will perform bed mobility with assistance, with/without cues (PT). Flowsheets (Taken 05/21/2021 1543) LTG: Pt will perform bed mobility with assistance level of: Supervision/Verbal cueing   Problem: RH Bed to Chair Transfers Goal: LTG Patient will perform bed/chair transfers w/assist (PT) Description: LTG: Patient will perform bed to chair transfers with assistance (PT). Flowsheets (Taken 05/21/2021 1543) LTG: Pt will perform Bed to Chair Transfers with assistance level: Supervision/Verbal cueing   Problem: RH Car Transfers Goal: LTG Patient will perform car transfers with assist (PT) Description: LTG: Patient will perform car transfers with assistance (PT). Flowsheets (Taken 05/21/2021 1543) LTG: Pt will perform car transfers with assist:: Supervision/Verbal cueing   Problem: RH Ambulation Goal: LTG Patient will  ambulate in controlled environment (PT) Description: LTG: Patient will ambulate in a controlled environment, # of feet with assistance (PT). Flowsheets (Taken 05/21/2021 1543) LTG: Pt will ambulate in controlled environ  assist needed:: Supervision/Verbal cueing LTG: Ambulation distance in controlled environment: 150 Goal: LTG Patient will ambulate in home environment (PT) Description: LTG: Patient will ambulate in home environment, # of feet with assistance (PT). Flowsheets (Taken 05/21/2021 1543) LTG: Pt will ambulate in home environ  assist needed:: Supervision/Verbal cueing LTG: Ambulation distance in home environment: 150   Problem: RH Stairs Goal: LTG Patient will ambulate up and down stairs w/assist (PT) Description: LTG: Patient will ambulate up and down # of stairs with assistance (PT) Flowsheets (Taken 05/21/2021 1543) LTG: Pt will ambulate up/down stairs assist needed:: Supervision/Verbal cueing LTG: Pt will  ambulate up and down number of stairs: 3 Note: R HR per home set up

## 2021-05-21 NOTE — Evaluation (Signed)
Occupational Therapy Assessment and Plan  Patient Details  Name: Brandon French MRN: [id number] Date of Birth: 16-Apr-1966  OT Diagnosis: abnormal posture, cognitive deficits, hemiplegia affecting non-dominant side, and muscle weakness (generalized) Rehab Potential: Rehab Potential (ACUTE ONLY): Good ELOS: 14-16   Today's Date: 05/21/2021 OT Individual Time: 2725-3664 OT Individual Time Calculation (min): 60 min     Hospital Problem: Principal Problem:   CVA (cerebral vascular accident) [facility])   Past Medical History:  Past Medical History:  Diagnosis Date   Diabetes mellitus ([place])    Elevated cholesterol    High blood pressure    Past Surgical History: History reviewed. No pertinent surgical history.  Assessment & Plan Clinical Impression:  Patient currently requires mod with basic self-care skills secondary to muscle weakness, decreased cardiorespiratoy endurance, impaired timing and sequencing, unbalanced muscle activation, and decreased coordination, decreased safety awareness and decreased memory, and decreased standing balance, decreased postural control, hemiplegia, and decreased balance strategies.  Prior to hospitalization, patient could complete BADL/IADL with modified independent .  Patient will benefit from skilled intervention to decrease level of assist with basic self-care skills and increase independence with basic self-care skills prior to discharge home with care partner.  Anticipate patient will require 24 hour supervision and follow up home health.  OT - End of Session Activity Tolerance: Tolerates 10 - 20 min activity with multiple rests Endurance Deficit: Yes OT Assessment Rehab Potential (ACUTE ONLY): Good OT Barriers to Discharge: Decreased caregiver support;Weight OT Patient demonstrates impairments in the following area(s): Balance;Cognition;Endurance;Motor;Nutrition;Safety;Vision OT Basic ADL's Functional Problem(s):  Grooming;Bathing;Dressing;Toileting OT Transfers Functional Problem(s): Toilet;Tub/Shower OT Additional Impairment(s): Fuctional Use of Upper Extremity OT Plan OT Intensity: Minimum of 1-2 x/day, 45 to 90 minutes OT Frequency: 5 out of 7 days OT Duration/Estimated Length of Stay: 14-16 OT Treatment/Interventions: Balance/vestibular training;Discharge planning;Pain management;Self Care/advanced ADL retraining;Therapeutic Activities;UE/LE Coordination activities;Visual/perceptual remediation/compensation;Therapeutic Exercise;Skin care/wound managment;Patient/family education;Functional mobility training;Disease mangement/prevention;Cognitive remediation/compensation;Community reintegration;DME/adaptive equipment instruction;Neuromuscular re-education;Psychosocial support;UE/LE Strength taining/ROM;Wheelchair propulsion/positioning OT Self Feeding Anticipated Outcome(s): S OT Basic Self-Care Anticipated Outcome(s): S OT Toileting Anticipated Outcome(s): S OT Bathroom Transfers Anticipated Outcome(s): s OT Recommendation Patient destination: Home Follow Up Recommendations: Home health OT;Outpatient OT Equipment Recommended: 3 in 1 bedside comode;Tub/shower bench   OT Evaluation Precautions/Restrictions  Precautions Precautions: Fall Restrictions Weight Bearing Restrictions: No General Chart Reviewed: Yes Family/Caregiver Present: No Vital Signs Therapy Vitals Pulse Rate: 85 BP: (!) 174/96 Patient Position (if appropriate): Lying Oxygen Therapy SpO2: 100 %  188/109 seated EOB after bathing UB/donning pants- pt promptly returned to bed. 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[employer], drives, shops Vision Baseline Vision/History:  (L vis field deficit) Ability to See in Adequate Light: 1 Impaired Patient Visual Report: No change from baseline Vision Assessment?: Yes Tracking/Visual Pursuits: Able to track stimulus in all quads without difficulty Visual Fields: Left superior homonymous quadranopsia Perception  Perception: Within Functional Limits Praxis Praxis: Intact Cognition Overall Cognitive Status: Within Functional Limits for tasks assessed Arousal/Alertness: Awake/alert Year: 2022 Month: October Day of Week: Correct Memory: Appears intact Immediate Memory Recall: Sock;Blue;Bed Memory Recall Sock: Not able to recall Memory Recall Blue: Without Cue Memory Recall Bed: With Cue Awareness:  Appears intact Problem Solving: Appears intact Safety/Judgment: Impaired Sensation Sensation Light Touch: Appears Intact Coordination Gross Motor Movements are Fluid and Coordinated: No Fine Motor Movements are Fluid and Coordinated: No Finger Nose Finger Test: decreased speed/effcient trajectory with LUE  9HPT: LUE: 44.1 RUE 37.9  Motor  Motor Motor: Hemiplegia Motor - Skilled Clinical Observations: mild R  Trunk/Postural Assessment  Cervical Assessment Cervical Assessment:  (head forward) Thoracic Assessment Thoracic Assessment:  (rounded shoulders) Lumbar Assessment Lumbar Assessment:  (post pelvic tilt) Postural Control Postural Control: Deficits on evaluation  Balance   Extremity/Trunk Assessment   LUE Assessment LUE Assessment: Exceptions to [facility] General Strength Comments: strength deficits proximla >distal LUE Body System: Neuro Brunstrum levels for arm and hand: Arm;Hand Brunstrum level for arm: Stage V Relative Independence from Synergy Brunstrum level for hand: Stage VI Isolated joint  movements  Care Tool Care Tool Self Care Eating   Eating Assist Level: Supervision/Verbal cueing    Oral Care    Oral Care Assist Level: Set up assist (with suction toothette)    Bathing   Body parts bathed by patient: Right arm;Left arm;Chest;Abdomen;Front perineal area;Right upper leg;Left upper leg;Face Body parts bathed by helper: Buttocks;Right lower leg;Left lower leg   Assist Level: Moderate Assistance - Patient 50 - 74%    Upper Body Dressing(including orthotics)   What is the patient wearing?: Pull over shirt   Assist Level: Minimal Assistance - Patient > 75%    Lower Body Dressing (excluding footwear)   What is the patient wearing?: Incontinence brief Assist for lower body dressing: Maximal Assistance - Patient 25 - 49%    Putting on/Taking off footwear   What is the patient wearing?: Non-skid slipper socks Assist for footwear: Dependent - Patient 0%       Care Tool Toileting Toileting activity   Assist for toileting: Maximal Assistance - Patient 25 - 49%     Care Tool Bed Mobility Roll left and right activity   Roll left and right assist level: Moderate Assistance - Patient 50 - 74%    Sit to lying activity   Sit to lying assist level: Moderate Assistance - Patient 50 - 74%    Lying to sitting on side of bed activity   Lying to sitting on side of bed assist level: the ability to move from lying on the back to sitting on the side of the bed with no back support.: Minimal Assistance - Patient > 75%     Care Tool Transfers Sit to stand transfer   Sit to stand assist level: Minimal Assistance - Patient > 75%    Chair/bed transfer         Toilet transfer         Care Tool Cognition  Expression of Ideas and Wants Expression of Ideas and Wants: 2. Frequent difficulty - frequently exhibits difficulty with expressing needs and ideas  Understanding Verbal and Non-Verbal Content Understanding Verbal and Non-Verbal Content: 4. Understands (complex and basic)  - clear comprehension without cues or repetitions   Memory/Recall Ability Memory/Recall Ability : Current season;That he or she is in a hospital/hospital unit   Refer to Care Plan for [place] 1 OT Short Term Goal 1 (Week 1): Pt will transfer to toilet wiht CGA OT Short Term Goal 2 (Week 1): Pt will don shirt wiht S OT Short Term Goal 3 (Week 1): Pt will stand to groom to demo improved activity tolernace OT Short Term Goal 4 (Week  1): Pt will thread BLE into pants wiht AE PRN  Recommendations for other services: None    Skilled Therapeutic Intervention Pt received in bed agreeable to OT. Skilled monitoring of vitals and communication with tx team about BP. Pt educated on OT, CIR, ELOS, POC and CVA recovery. Pt with elevated BP and completes partial BADL at EOB as written below. Pt able to complete hemi dressing with no cuing for UB, but d/t body habitus/balance unable ot thread BLE into pants. Pt sit to stand at EOB with MIN A and RW with facilitation of forward weight shift and VC for keeping feet inside RW. Pt returned to bed as BP 188/109 and MD/LPN alerted. Pt completes 9HPT as stated above. Exited session with pt seated in bed in chair positiong for BP management, exit alarm on and call light in reach   ADL ADL Grooming: Supervision/safety (suciton) Where Assessed-Grooming: Edge of bed Upper Body Bathing: Minimal assistance Where Assessed-Upper Body Bathing: Edge of bed Lower Body Bathing: Maximal assistance Where Assessed-Lower Body Bathing: Edge of bed Upper Body Dressing: Minimal assistance Where Assessed-Upper Body Dressing: Edge of bed Lower Body Dressing: Maximal assistance Where Assessed-Lower Body Dressing: Edge of bed Toileting: Maximal assistance (simulated from LB dressing) Where Assessed-Toileting: Toilet Mobility  Transfers Sit to Stand: Minimal Assistance - Patient > 75% Stand to Sit: Minimal Assistance - Patient > 75%   Discharge  Criteria: Patient will be discharged from OT if patient refuses treatment 3 consecutive times without medical reason, if treatment goals not met, if there is a change in medical status, if patient makes no progress towards goals or if patient is discharged from hospital.  The above assessment, treatment plan, treatment alternatives and goals were discussed and mutually agreed upon: by patient  Tonny Branch 05/21/2021, 12:21 PM

## 2021-05-21 NOTE — Progress Notes (Signed)
PROGRESS NOTE   Subjective/Complaints: Hypertensive with therapy today. Amlodipine 10mg  started and hydralazine 10mg  q6H prn ordered- administered once per day. Patient was asymptomatic and tolerated therapy He has no complaints  ROS:+difficulty swallowing as per nursing   Objective:   DG Chest 2 View  Result Date: 05/20/2021 CLINICAL DATA:  Wheezing EXAM: CHEST - 2 VIEW COMPARISON:  None. FINDINGS: Cardiomegaly with vascular congestion. Possible bronchitic changes. No consolidation, pleural effusion or pneumothorax IMPRESSION: 1. Cardiomegaly with slight central congestion 2. Mild bronchitic changes Electronically Signed   By: M.D.   On: 05/20/2021 19:24   VAS Jasmine Pang LOWER EXTREMITY VENOUS (DVT)  Result Date: 05/21/2021  Lower Venous DVT Study Patient Name:  Brandon French  Date of Exam:   05/21/2021 Medical Rec #: Harlow Asa       Accession #:    05/23/2021 Date of Birth: 1965-09-20      Patient Gender: M Patient Age:   68 years Exam Location:  [facility] Procedure:      VAS 40 LOWER EXTREMITY VENOUS (DVT) Referring Phys: PAMELA LOVE --------------------------------------------------------------------------------  Indications: Stroke.  Limitations: Body habitus and poor ultrasound/tissue interface. Comparison Study: no prior Performing Technologist: [facility]  Examination Guidelines: A complete evaluation includes B-mode imaging, spectral Doppler, color Doppler, and power Doppler as needed of all accessible portions of each vessel. Bilateral testing is considered an integral part of a complete examination. Limited examinations for reoccurring indications may be performed as noted. The reflux portion of the exam is performed with the patient in reverse Trendelenburg.  +---------+---------------+---------+-----------+----------+-------------------+ RIGHT     CompressibilityPhasicitySpontaneityPropertiesThrombus Aging      +---------+---------------+---------+-----------+----------+-------------------+ CFV      Full           Yes      Yes                                      +---------+---------------+---------+-----------+----------+-------------------+ SFJ      Full                                                             +---------+---------------+---------+-----------+----------+-------------------+ FV Prox  Full                                                             +---------+---------------+---------+-----------+----------+-------------------+ FV Mid   Full                                                             +---------+---------------+---------+-----------+----------+-------------------+  FV DistalFull                                                             +---------+---------------+---------+-----------+----------+-------------------+ PFV      Full                                                             +---------+---------------+---------+-----------+----------+-------------------+ POP      Full           Yes      Yes                                      +---------+---------------+---------+-----------+----------+-------------------+ PTV      Full                                                             +---------+---------------+---------+-----------+----------+-------------------+ PERO                                                  Not well visualized +---------+---------------+---------+-----------+----------+-------------------+   +---------+---------------+---------+-----------+----------+--------------+ LEFT     CompressibilityPhasicitySpontaneityPropertiesThrombus Aging +---------+---------------+---------+-----------+----------+--------------+ CFV      Full           Yes      Yes                                  +---------+---------------+---------+-----------+----------+--------------+ SFJ      Full                                                        +---------+---------------+---------+-----------+----------+--------------+ FV Prox  Full                                                        +---------+---------------+---------+-----------+----------+--------------+ FV Mid   Full                                                        +---------+---------------+---------+-----------+----------+--------------+ FV DistalFull                                                        +---------+---------------+---------+-----------+----------+--------------+  PFV      Full                                                        +---------+---------------+---------+-----------+----------+--------------+ POP      Full           Yes      Yes                                 +---------+---------------+---------+-----------+----------+--------------+ PTV      Full                                                        +---------+---------------+---------+-----------+----------+--------------+ PERO     Full                                                        +---------+---------------+---------+-----------+----------+--------------+     Summary: BILATERAL: - No evidence of deep vein thrombosis seen in the lower extremities, bilaterally. -No evidence of popliteal cyst, bilaterally.   *See table(s) above for measurements and observations. Electronically signed by Waverly Ferrari MD on 05/21/2021 at 3:59:41 PM.    Final    Recent Labs    05/19/21 0609 05/21/21 0531  WBC 8.1 6.7  HGB 12.3* 12.5*  HCT 40.8 41.1  PLT 297 337   Recent Labs    05/19/21 0609 05/21/21 0531  NA 139 139  K 3.5 4.2  CL 108 107  CO2 22 27  GLUCOSE 110* 111*  BUN 25* 23*  CREATININE 1.90* 2.17*  CALCIUM 8.4* 8.5*    Intake/Output Summary (Last 24 hours) at 05/21/2021 1923 Last  data filed at 05/21/2021 1319 Gross per 24 hour  Intake 120 ml  Output --  Net 120 ml        Physical Exam: Vital Signs Blood pressure (!) 163/113, pulse 86, temperature 98.4 F (36.9 C), temperature source Oral, resp. rate 17, height 6' (1.829 m), weight (!) 147.6 kg, SpO2 100 %. Gen: no distress, normal appearing HEENT: oral mucosa pink and moist, NCAT Cardio: Reg rate Chest: normal effort, normal rate of breathing Abd: soft, non-distended Ext: no edema Psych: pleasant, normal affect Musculoskeletal:     Cervical back: Normal range of motion and neck supple.     Comments: No edema in extremities LLE with some pain with manipulation  Skin:    General: Skin is warm and dry.  Neurological:     Mental Status: He is alert and oriented to person, place, and time.     Comments: Alert Severe dysarthria Able to answer biographic questions.  He was able to follow simple motor commands.  Sensation diminished to light touch LLE Motor: RUE/RLE; 5/5 proximal to distal LUE: 4/5 proximal to distal LLE: 3-/5 proximal to distal  Psychiatric:        Mood and Affect: Mood normal.        Behavior: Behavior normal.    Assessment/Plan:  1. Functional deficits which require 3+ hours per day of interdisciplinary therapy in a comprehensive inpatient rehab setting. Physiatrist is providing close team supervision and 24 hour management of active medical problems listed below. Physiatrist and rehab team continue to assess barriers to discharge/monitor patient progress toward functional and medical goals  Care Tool:  Bathing    Body parts bathed by patient: Right arm, Left arm, Chest, Abdomen, Front perineal area, Right upper leg, Left upper leg, Face   Body parts bathed by helper: Buttocks, Right lower leg, Left lower leg     Bathing assist Assist Level: Moderate Assistance - Patient 50 - 74%     Upper Body Dressing/Undressing Upper body dressing   What is the patient wearing?: Pull  over shirt    Upper body assist Assist Level: Moderate Assistance - Patient 50 - 74%    Lower Body Dressing/Undressing Lower body dressing      What is the patient wearing?: Incontinence brief, Pants     Lower body assist Assist for lower body dressing: Maximal Assistance - Patient 25 - 49%     Toileting Toileting    Toileting assist Assist for toileting: Maximal Assistance - Patient 25 - 49%     Transfers Chair/bed transfer  Transfers assist     Chair/bed transfer assist level: Minimal Assistance - Patient > 75%     Locomotion Ambulation   Ambulation assist      Assist level: Moderate Assistance - Patient 50 - 74% Assistive device: Walker-rolling Max distance: 25   Walk 10 feet activity   Assist     Assist level: Minimal Assistance - Patient > 75% Assistive device: Walker-rolling   Walk 50 feet activity   Assist Walk 50 feet with 2 turns activity did not occur: Safety/medical concerns         Walk 150 feet activity   Assist Walk 150 feet activity did not occur: Safety/medical concerns         Walk 10 feet on uneven surface  activity   Assist Walk 10 feet on uneven surfaces activity did not occur: Safety/medical concerns         Wheelchair     Assist Is the patient using a wheelchair?: No             Wheelchair 50 feet with 2 turns activity    Assist            Wheelchair 150 feet activity     Assist          Blood pressure (!) 163/113, pulse 86, temperature 98.4 F (36.9 C), temperature source Oral, resp. rate 17, height 6' (1.829 m), weight (!) 147.6 kg, SpO2 100 %.    Medical Problem List and Plan: 1.  Deficits with mobility, speech, self-care secondary to acute left brain nonhemorrhagic infarct.              -patient may shower             -ELOS/Goals: Supervision/Mod I with PT/OT and Min A with SLP.             Initial CIR evaluations today 2.  Impaired mobility: continue Lovenox. Dopplers  reviewed and negative for clots.              -antiplatelet therapy: DAPT x1 month followed by Plavix alone. 3. Pain Management: Tylenol as needed. 4. Mood: LCSW to follow for evaluation and support.             -  antipsychotic agents: N/A 5. Neuropsych: This patient is not capable of making decisions on his own behalf. 6. Skin/Wound Care: Routine pressure-relief measures. 7. Fluids/Electrolytes/Nutrition: Will monitor intake/output.              CMP ordered for tomorrow AM             -- Will need assistance/encouragement for intake. 8.  HTN: Monitor blood pressures 3 times daily 5-7 days.              --Avoid HTN with intracranial/basilar artery stenosis.             Monitor with increased exertion  -increase amlodipine to 10mg  and start hydralazine 10mg  q6H prn for SBP>160, DBP>100 9.  T2DM with hyperglycemia: Hgb A1C- 6.6.  Was on metformin prior to admission which is being held due to CKD?             -- Monitor blood sugars achs and use SSI for elevated BS              Monitor with increased activity 10.  CKD stage IIIb: Serum creatinine 1.92 range per records.             -- start IVF at 31mls/hr for 8 hours           Cr up to 2.17 11.  Possible HCOM: Severe asymmetric LV septal hypertrophy per echo             -- Will need referral to cardiology at discharge 12. 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 Will need assistance with fluid intake.  --CXR shows cardiomegaly with slight central congestion 13. Dyslipidemia: LDL 139. On Crestor.  14. Morbid obesity: Educate on importance of diet/wt loss to promote health and mobility.   15.BPH?: Indicating difficulty with voiding--will resume Proscar and Flomax.  --toilet every 3-4 hours/assist with urinal  LOS: 1 days A FACE TO FACE EVALUATION WAS PERFORMED  Bellah Alia 05/21/2021, 7:23 PM

## 2021-05-21 NOTE — Progress Notes (Signed)
Lower extremity venous has been completed.   Preliminary results in CV Proc.   Aundra Millet Nillie Bartolotta 05/21/2021 3:22 PM

## 2021-05-21 NOTE — Evaluation (Signed)
Speech Language Pathology Assessment and Plan  Patient Details  Name: Brandon French MRN: [id number] Date of Birth: June 05, 1966  SLP Diagnosis: Dysarthria;Dysphagia  Rehab Potential: Good ELOS: 14-16 days    Today's Date: 05/21/2021 SLP Individual Time: 0511-0211 SLP Individual Time Calculation (min): 60 min   Hospital Problem: Principal Problem:   CVA (cerebral vascular accident) [facility])  Past Medical History:  Past Medical History:  Diagnosis Date   Diabetes mellitus ([place])    Elevated cholesterol    High blood pressure    Past Surgical History: History reviewed. No pertinent surgical history.  Assessment / Plan / Recommendation Clinical Impression  Brandon French is a 55 year old RH-male with history of HTN, T2DM, BPH?, who was admitted to [facility] on 05/16/2021 after waking up with slurred speech and left hemiparesis. He was drooling and reported to have trouble swallowing. 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  Echocardiogram with EF of EF 65-70% and sevvere asymmetric left ventricular hypertrophy of septal segment--cardiology referral recommended.  Dr. Merlene Laughter consulted for input and recommended aspirin/Plavix for 1 month followed by Plavix alone.   NPO. recommended due to severe dysphagia with inability to handle secretions.  MBS done on showing severe oral effusion mild to moderate pharyngeal phase dysphagia with penetration and aspiration of thin and nectar and honey liquids.  He was able to tolerate honey thick by teaspoon  and needed verbal cues to try and engage on phase.  PEG tube was recommended due to concerns of ability to maintain adequate nutrition.  Therapy ongoing and patient limited by left-sided hemiparesis with balance deficits and fatigue.  CIR was recommended due to functional decline. Please see preadmission assessment from earlier today as well. SLP evaluation completed on 05/21/2021 with results as follows:  Pt presents with a moderately severe dysphagia.  Pt has significant oral motor weakness with decreased lingual protrusion and labial closure which leads to difficulty managing secretions prior to initiation of PO trials.  Despite decreased labial closure and lingual weakness, pt had no significant anterior loss or buccal pocketing of boluses and, with supervision cues, was able to hold an ice chip on his tongue while swallowing small amounts of thin liquids as it melted with no overt s/s of aspiration.  Pt had immediate coughing with teaspoons of thin liquids and cup sips of honey thick liquids but no overt s/s of aspiration with teaspoons of honey thick liquids or purees.  Recommend that pt remain on his currently prescribed diet of dys 1 textures and honey thick liquids via teaspoon with full supervision for use of swallowing precautions.    Due to the abovementioned oral motor weakness and decreased breath support for speech, pt has imprecise articulation of consonants and low vocal intensity which impact his intelligibility at the word level to the point where pt is currently primarily relying on written/typed communication to express his needs and wants to caregivers.  Pt needed min cues to clarify written messages as his handwriting was difficult to read and he would leave out important content words.    Given the abovementioned deficits, pt would benefit from skilled ST while  inpatient in order to maximize functional independence and reduce burden of care prior to discharge.  Anticipate that pt will  need at least intermittent supervision during meals upon discharge in addition to ST follow up at next level of care.        Skilled Therapeutic Interventions          Cognitive-linguistic and bedside swallow evaluation completed with results and recommendations reviewed with patient.     SLP Assessment  Patient will need skilled Speech Lanaguage Pathology Services during CIR admission    Recommendations  SLP Diet Recommendations: Dysphagia 1 (Puree);Honey Liquid Administration via: Spoon Medication Administration: Crushed with puree Supervision: Patient able to self feed;Full supervision/cueing for compensatory strategies Compensations: Small sips/bites;Multiple dry swallows after each bite/sip    SLP Frequency 3 to 5 out of 7 days   SLP Duration  SLP Intensity  SLP Treatment/Interventions 14-16 days  Minumum of 1-2 x/day, 30 to 90 minutes  Cueing hierarchy;Dysphagia/aspiration precaution training;Speech/Language facilitation;Internal/external aids;Multimodal communication approach;Environmental controls    Pain Pain Assessment Pain Scale: 0-10 Pain Score: 0-No pain  Prior Functioning Cognitive/Linguistic Baseline: Within functional limits Type of Home: House  Lives With: Family Available Help at Discharge: Available 24 hours/day Education: 12th grade Vocation: On disability  SLP Evaluation Cognition Overall Cognitive Status: Within Functional Limits for tasks assessed (for basic tasks, difficult to assess higher level cognition due to communication impairments) Orientation Level: Oriented X4  Comprehension Auditory Comprehension Overall Auditory Comprehension: Appears within functional limits for tasks assessed Expression Expression Primary Mode of Expression: Verbal Verbal Expression Overall Verbal Expression: Impaired Initiation: No impairment Level of Generative/Spontaneous Verbalization: Phrase;Word Pragmatics: No impairment Interfering Components:  Speech intelligibility Non-Verbal Means of Communication: Writing Oral Motor Oral Motor/Sensory Function Overall Oral Motor/Sensory Function: Severe impairment Facial ROM: Reduced right;Reduced left Facial Symmetry: Within Functional Limits Lingual ROM: Suspected CN XII (hypoglossal) dysfunction Lingual Symmetry: Suspected CN XII (hypoglossal) dysfunction Lingual Strength: Suspected CN XII (hypoglossal) dysfunction;Reduced Lingual Sensation: Within Functional Limits Mandible: Impaired;Suspected CN V (Trigeminal) dysfunction Motor Speech Overall Motor Speech: Impaired Respiration: Impaired Level of Impairment: Phrase Phonation: Low vocal intensity Articulation: Impaired Level of Impairment: Word Intelligibility: Intelligibility reduced Word: 0-24% accurate Motor Planning: Witnin functional limits Effective Techniques: Increased vocal intensity;Over-articulate;Slow rate;Pause  Care Tool Care Tool Cognition Ability to hear (with hearing aid or hearing appliances if normally used Ability to hear (with hearing aid or hearing appliances if normally used): 0. Adequate - no difficulty in normal conservation, social interaction, listening to TV   Expression of Ideas and Wants Expression of Ideas and Wants: 2. Frequent difficulty - frequently exhibits difficulty with expressing needs and ideas   Understanding Verbal and Non-Verbal Content Understanding Verbal and Non-Verbal Content: 4. Understands (complex and basic) - clear comprehension without cues or repetitions  Memory/Recall Ability Memory/Recall Ability : Current season;That he or she is in a hospital/hospital unit   PMSV Assessment  PMSV Trial Intelligibility: Intelligibility reduced Word: 0-24% accurate  Bedside Swallowing Assessment General Date of Onset: 05/16/21 Previous Swallow Assessment: MBS 05/18/2021 Diet Prior to this Study: Dysphagia 1 (puree);Honey-thick liquids Temperature Spikes Noted: No Respiratory Status:  Room air History of Recent Intubation: No Behavior/Cognition: Alert;Cooperative;Pleasant mood Oral Cavity - Dentition: Poor condition;Adequate natural dentition Self-Feeding Abilities: Able to feed self Vision: Functional for self-feeding Patient Positioning: Upright in chair/Tumbleform Baseline Vocal Quality: Low vocal intensity Volitional Cough: Strong Volitional Swallow: Able to elicit  Oral Care Assessment Does patient have any of the following "high(er) risk" factors?: None of the above Does  patient have any of the following "at risk" factors?: Mucous Membranes - reddened Patient is AT RISK: Order set for Adult Oral Care Protocol initiated -  "At Risk Patients" option selected (see row information) Ice Chips Ice chips: Impaired (ice chips administered with cues to hold ice in mouth and swallow small amounts of thin liquids as it melted) Presentation: Spoon Oral Phase Impairments: Reduced labial seal Pharyngeal Phase Impairments: Suspected delayed Swallow Thin Liquid Thin Liquid: Impaired Presentation: Spoon Oral Phase Impairments: Reduced lingual movement/coordination;Reduced labial seal Pharyngeal  Phase Impairments: Multiple swallows;Cough - Immediate Nectar Thick   Honey Thick Honey Thick Liquid: Impaired Presentation: Cup;Spoon Oral Phase Impairments: Reduced lingual movement/coordination;Reduced labial seal Pharyngeal Phase Impairments: Suspected delayed Swallow;Cough - Immediate (immediate cough with cup sips only, no overt s/s of aspiration with teaspoons of thickened liquids as prescribed in MBS) Puree Puree: Impaired Oral Phase Impairments: Reduced lingual movement/coordination;Reduced labial seal Solid   BSE Assessment Risk for Aspiration Impact on safety and function: Moderate aspiration risk;Risk for inadequate nutrition/hydration Other Related Risk Factors: Deconditioning  Short Term Goals: Week 1: SLP Short Term Goal 1 (Week 1): Pt will consume  presentations of honey thick liquids via teaspoon and dys 1 textures with supervision cues for use of swallowing precautions. SLP Short Term Goal 2 (Week 1): Pt will consume therapeutic trials of honey thick liquids via cup sips with min cues for use of swallowing precautions and minimal overt s/s of aspiration. SLP Short Term Goal 3 (Week 1): Pt will utilize overarticulation and loud voice to achieve intelligibilty at the word level in ~50% of opportunities during structured tasks. SLP Short Term Goal 4 (Week 1): Pt will  Refer to Care Plan for Long Term Goals  Recommendations for other services: None   Discharge Criteria: Patient will be discharged from SLP if patient refuses treatment 3 consecutive times without medical reason, if treatment goals not met, if there is a change in medical status, if patient makes no progress towards goals or if patient is discharged from hospital.  The above assessment, treatment plan, treatment alternatives and goals were discussed and mutually agreed upon: by patient  Emilio Math 05/21/2021, 12:44 PM

## 2021-05-21 NOTE — Evaluation (Signed)
Physical Therapy Assessment and Plan  Patient Details  Name: Brandon French MRN: [id number] Date of Birth: 1965-09-09  PT Diagnosis: Abnormal posture, Abnormality of gait, Difficulty walking, and Hemiparesis non-dominant Rehab Potential: Good ELOS: 12-14   Today's Date: 05/21/2021 PT Individual Time: 2355-7322 PT Individual Time Calculation (min): 57 min    Hospital Problem: Principal Problem:   CVA (cerebral vascular accident) [facility])   Past Medical History:  Past Medical History:  Diagnosis Date   Diabetes mellitus ([place])    Elevated cholesterol    High blood pressure    Past Surgical History: History reviewed. No pertinent surgical history.  Assessment & Plan Clinical Impression: Patient is a 55 y.o. year old male with history of HTN, T2DM, BPH?, who was admitted to [facility] on 05/16/2021 after waking up with slurred speech and left hemiparesis. He was drooling and reported to have trouble swallowing. 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  Echocardiogram with EF of EF 65-70% and sevvere asymmetric left ventricular hypertrophy of septal segment--cardiology referral recommended.  Dr. Merlene Laughter consulted for input and recommended aspirin/Plavix for 1 month followed by Plavix alone.   NPO. recommended due to severe dysphagia with inability to handle secretions.  MBS done on showing severe oral effusion mild to moderate pharyngeal phase dysphagia with penetration and aspiration of thin and nectar and honey liquids.  He was able to  tolerate honey thick by teaspoon and needed verbal cues to try and engage on phase.  PEG tube was recommended due to concerns of ability to maintain adequate nutrition.  Therapy ongoing and patient limited by left-sided hemiparesis with balance deficits and fatigue.  CIR was recommended due to functional decline. Please see preadmission assessment from earlier today as well.   Patient currently requires mod with mobility secondary to muscle weakness, decreased cardiorespiratoy endurance, field cut, decreased motor planning, and decreased sitting balance, decreased standing balance, decreased postural control, hemiplegia, and decreased balance strategies.  Prior to hospitalization, patient was modified independent  with mobility and lived with Family in a House home.  Home access is 3Ramped entrance.  Patient will benefit from skilled PT intervention to maximize safe functional mobility, minimize fall risk, and decrease caregiver burden for planned discharge home with 24 hour supervision.  Anticipate patient will benefit from follow up [place] at discharge.  PT - End of Session Activity Tolerance: Tolerates 10 - 20 min activity with multiple rests Endurance Deficit: Yes PT Assessment Rehab Potential (ACUTE/IP ONLY): Good PT Barriers to Discharge: Decreased caregiver support PT Barriers to Discharge Comments: h/o prior cva PT Patient demonstrates impairments in the following area(s): Balance;Behavior;Endurance;Motor;Nutrition;Pain;Perception;Safety;Sensory;Skin Integrity PT Transfers Functional Problem(s): Bed Mobility;Bed to Chair;Car;Furniture PT Locomotion Functional Problem(s): Ambulation;Stairs PT Plan PT Intensity: Minimum of 1-2 x/day ,45 to 90 minutes PT Frequency: 5 out of 7 days PT Duration Estimated Length of Stay: 12-14 PT Treatment/Interventions: Ambulation/gait training;Cognitive remediation/compensation;Discharge planning;DME/adaptive equipment instruction;Functional mobility  training;Pain management;Psychosocial support;Balance/vestibular training;Community reintegration;Disease management/prevention;Functional electrical stimulation;Neuromuscular re-education;Patient/family education;Skin care/wound management;Stair training;Therapeutic Exercise;UE/LE Coordination activities;Wheelchair propulsion/positioning;Splinting/orthotics;Therapeutic Activities;UE/LE Strength taining/ROM;Visual/perceptual remediation/compensation PT Transfers Anticipated Outcome(s): spv PT Locomotion Anticipated Outcome(s): spv PT Recommendation Follow Up Recommendations: Home health PT;24 hour  supervision/assistance Patient destination: Home Equipment Recommended: To be determined   PT Evaluation Precautions/Restrictions Precautions Precautions: Fall Restrictions Weight Bearing Restrictions: No General   Vital SignsTherapy Vitals Temp: 98.4 F (36.9 C) Temp Source: Oral Pulse Rate: 86 Resp: 17 BP: (!) 163/113 Patient Position (if appropriate): Sitting Oxygen Therapy SpO2: 100 % O2 Device: Room Air Pain Pain Assessment Pain Scale: 0-10 Pain Score: 0-No pain Pain Interference Pain Interference Pain Effect on Sleep: 0. Does not apply - I have not had any pain or hurting in the past 5 days Pain Interference with Therapy Activities: 0. Does not apply - I have not received rehabilitationtherapy in the past 5 days Pain Interference with Day-to-Day Activities: 1. Rarely or not at all Home Living/Prior [place] Available Help at Discharge: Available 24 hours/day (dad has dialysis,mom stays home but doesn't drive) Type of Home: House Home Access: Ramped entrance Entrance Stairs-Number of Steps: 3 Entrance Stairs-Rails: Right Home Layout: One level Bathroom Shower/Tub: Chiropodist: Handicapped height Bathroom Accessibility: Yes  Lives With: Family Prior Function Level of Independence: Independent with basic ADLs;Independent with  gait;Independent with homemaking with ambulation;Independent with transfers  Able to Take Stairs?: Yes Driving: Yes Vocation: On disability 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[employer], drives, shops Vision/Perception  Vision - History Ability to See in Adequate Light: 1 Impaired Perception Perception: Within Functional Limits Praxis Praxis: Intact  Cognition Overall Cognitive Status: Within Functional Limits for tasks assessed Arousal/Alertness: Awake/alert Orientation Level: Oriented X4 Memory: Appears intact Awareness: Appears intact Problem Solving: Appears intact Safety/Judgment: Impaired Sensation Sensation Light Touch: Appears Intact Hot/Cold: Not tested Proprioception: Appears Intact Stereognosis: Appears Intact Coordination Gross Motor Movements are Fluid and Coordinated: No Fine Motor Movements are Fluid and Coordinated: No Heel Shin Test: slightly diminshed AROM L LE Motor  Motor Motor: Hemiplegia Motor - Skilled Clinical Observations: mild R   Trunk/Postural Assessment  Cervical Assessment Cervical Assessment: Within Functional Limits Thoracic Assessment Thoracic Assessment: Exceptions to [facility] Lumbar Assessment Lumbar Assessment: Exceptions to [facility] Postural Control Postural Control: Deficits on evaluation Righting Reactions: delayed and inadequate Protective Responses: delayed and inadequate  Balance Balance Balance Assessed: Yes Standardized Balance Assessment Standardized Balance Assessment: Timed Up and Go Test Timed Up and Go Test TUG: Normal TUG Normal TUG (seconds): 44.8 Static Sitting Balance Static Sitting - Balance Support: Feet supported Static Sitting - Level of Assistance: 5: Stand by assistance Dynamic Sitting Balance Dynamic Sitting - Balance Support: Feet supported Dynamic Sitting - Level of Assistance: 4: Min assist Static Standing Balance Static Standing - Balance Support: During functional activity;Bilateral upper extremity  supported Static Standing - Level of Assistance: 4: Min assist Dynamic Standing Balance Dynamic Standing - Balance Support: During functional activity Dynamic Standing - Level of Assistance: 3: Mod assist Extremity Assessment      RLE Assessment RLE Assessment: Exceptions to [facility] General Strength Comments: grossly 4/5 LLE Assessment LLE Assessment: Exceptions to [facility] General Strength Comments: grossly 4-/5  Care Tool Care Tool Bed Mobility Roll left and right activity   Roll left and right assist level: Minimal Assistance - Patient > 75%    Sit to lying activity   Sit to lying assist level: Minimal Assistance - Patient > 75%    Lying to sitting on side of bed activity   Lying to sitting on side of bed assist level: the ability to move from lying on the back to sitting on the side of the bed with no back support.: Minimal Assistance - Patient > 75%  Care Tool Transfers Sit to stand transfer   Sit to stand assist level: Moderate Assistance - Patient 50 - 74%    Chair/bed transfer   Chair/bed transfer assist level: Minimal Assistance - Patient > 75%     Toilet transfer Toilet transfer activity did not occur: Safety/medical concerns      Geneticist, molecular transfer assist level: Minimal Assistance - Patient > 75%      Care Tool Locomotion Ambulation   Assist level: Moderate Assistance - Patient 50 - 74% Assistive device: Walker-rolling Max distance: 25  Walk 10 feet activity   Assist level: Minimal Assistance - Patient > 75% Assistive device: Walker-rolling   Walk 50 feet with 2 turns activity Walk 50 feet with 2 turns activity did not occur: Safety/medical concerns      Walk 150 feet activity Walk 150 feet activity did not occur: Safety/medical concerns      Walk 10 feet on uneven surfaces activity Walk 10 feet on uneven surfaces activity did not occur: Safety/medical concerns      Stairs   Assist level: Minimal Assistance - Patient > 75% Stairs assistive  device: 1 hand rail;2 hand rails    Walk up/down 1 step activity   Walk up/down 1 step (curb) assist level: Minimal Assistance - Patient > 75% Walk up/down 1 step or curb assistive device: 1 hand rail;2 hand rails    Walk up/down 4 steps activity Walk up/down 4 steps assist level: Minimal Assistance - Patient > 75% Walk up/down 4 steps assistive device: 1 hand rail;2 hand rails  Walk up/down 12 steps activity Walk up/down 12 steps activity did not occur: Safety/medical concerns      Pick up small objects from floor Pick up small object from the floor (from standing position) activity did not occur: Safety/medical concerns      Wheelchair Is the patient using a wheelchair?: No          Wheel 50 feet with 2 turns activity      Wheel 150 feet activity        Refer to Care Plan for Long Term Goals  SHORT TERM GOAL WEEK 1 PT Short Term Goal 1 (Week 1): Patient will complete be dmobility with CGA consistently PT Short Term Goal 2 (Week 1): Patient will transfer with LRAD bed <> wc with CGA consistently PT Short Term Goal 3 (Week 1): Patient will ambulate >50 with LRAD with CGA consistently  Recommendations for other services: None   Skilled Therapeutic Intervention Mobility Bed Mobility Bed Mobility: Rolling Right;Rolling Left;Supine to Sit;Sit to Supine Rolling Right: Minimal Assistance - Patient > 75% Rolling Left: Minimal Assistance - Patient > 75% Supine to Sit: Minimal Assistance - Patient > 75% Sit to Supine: Minimal Assistance - Patient > 75% Transfers Transfers: Sit to Stand;Stand to Sit;Stand Pivot Transfers Sit to Stand: Moderate Assistance - Patient 50-74% Stand to Sit: Minimal Assistance - Patient > 75% Stand Pivot Transfers: Minimal Assistance - Patient > 75% Stand Pivot Transfer Details: Verbal cues for safe use of DME/AE;Verbal cues for technique Transfer (Assistive device): Rolling walker Locomotion  Gait Ambulation: Yes Gait Assistance: Moderate  Assistance - Patient 50-74% Gait Distance (Feet): 25 Feet Assistive device: Rolling walker Gait Assistance Details: Verbal cues for safe use of DME/AE;Verbal cues for precautions/safety Gait Gait: Yes Gait Pattern: Impaired Gait Pattern: Step-through pattern;Left foot flat;Wide base of support;Poor foot clearance - left;Poor foot clearance - right Gait velocity: decreased Stairs / Additional Locomotion Stairs: Yes Stairs  Assistance: Minimal Assistance - Patient > 75% Stair Management Technique: Two rails Number of Stairs: 4 Height of Stairs: 6 Wheelchair Mobility Wheelchair Mobility: No   Discharge Criteria: Patient will be discharged from PT if patient refuses treatment 3 consecutive times without medical reason, if treatment goals not met, if there is a change in medical status, if patient makes no progress towards goals or if patient is discharged from hospital.  The above assessment, treatment plan, treatment alternatives and goals were discussed and mutually agreed upon: by patient  Debbora Dus 05/21/2021, 2:00 PM

## 2021-05-22 DIAGNOSIS — I63219 Cerebral infarction due to unspecified occlusion or stenosis of unspecified vertebral arteries: Secondary | ICD-10-CM | POA: Diagnosis not present

## 2021-05-22 LAB — GLUCOSE, CAPILLARY
Glucose-Capillary: 95 mg/dL (ref 70–99)
Glucose-Capillary: 162 mg/dL — ABNORMAL HIGH (ref 70–99)
Glucose-Capillary: 111 mg/dL — ABNORMAL HIGH (ref 70–99)
Glucose-Capillary: 159 mg/dL — ABNORMAL HIGH (ref 70–99)

## 2021-05-22 LAB — BASIC METABOLIC PANEL
Anion gap: 7 (ref 5–15)
BUN: 24 mg/dL — ABNORMAL HIGH (ref 6–20)
CO2: 26 mmol/L (ref 22–32)
Calcium: 8.5 mg/dL — ABNORMAL LOW (ref 8.9–10.3)
Chloride: 108 mmol/L (ref 98–111)
Creatinine, Ser: 2.33 mg/dL — ABNORMAL HIGH (ref 0.61–1.24)
GFR, Estimated: 33 mL/min — ABNORMAL LOW (ref >60)
Glucose, Bld: 110 mg/dL — ABNORMAL HIGH (ref 70–99)
Potassium: 3.6 mmol/L (ref 3.5–5.1)
Sodium: 141 mmol/L (ref 135–145)

## 2021-05-22 MED ORDER — HYDRALAZINE HCL 10 MG PO TABS
10.0000 mg | ORAL_TABLET | Freq: Three times a day (TID) | ORAL | Status: DC
  Administered 2021-05-22 – 2021-05-25 (×8): 10 mg via ORAL
  Filled 2021-05-22 (×8): qty 1

## 2021-05-22 MED ORDER — SODIUM CHLORIDE 0.9 % IV SOLN: INTRAVENOUS | Status: DC

## 2021-05-22 NOTE — Progress Notes (Incomplete)
Patient requested to be transferred from wheelchair located on Right side of bed into his bed for the night. Nurse responded and tried to assist with the transfer using a gait belt and front wheel rolling walker. Patient was having difficulty finding the strength to stand up and when he would stand and sit back down the wheel chair would push back a little at a time even though locked. NT went to find a stedy while nurse waited bedside with patient. Patient slid off the side of the wheel chair and had an assisted fall with nurse by side. Patient denies any pain or discomfort other then the burning pain that is in his right leg that started about three nights ago. Administered tylenol prn for leg pain unrelated to fall and Hydralizine prn as he was within order parameters (see MAR).

## 2021-05-22 NOTE — Progress Notes (Signed)
Was the fall witnessed: yes   Patient condition before and after the fall: stable  Patient's reaction to the fall: flat  Name of the doctor that was notified including date and time: Dr. Carlis Abbott 7:00 am  Any interventions and vital signs: Patient denies any pain related to fall. BP 167/83 - Hydralazine 10mg  given   Incomplete            Patient requested to be transferred from wheelchair located on Right side of bed into his bed for the night. Nurse responded and tried to assist with the transfer using a gait belt and front wheel rolling walker. Patient was having difficulty finding the strength to stand up and when he would stand and sit back down the wheel chair would push back a little at a time even though locked. NT went to find a stedy while nurse waited bedside with patient. Patient slid off the side of the wheel chair and had an assisted fall with nurse by side. Patient denies any pain or discomfort other then the burning pain that is in his right leg that started about three nights ago. Administered tylenol prn for leg pain unrelated to fall and Hydralizine prn as he was within order parameters (see MAR).

## 2021-05-22 NOTE — Progress Notes (Signed)
PROGRESS NOTE   Subjective/Complaints: Had an assisted, witnessed fall last night while being transferred to wheelchair. Patient reports no symptoms currently related to fall Prn hydralazine administered for hypertension  ROS:+difficulty swallowing as per nursing, +aphasia   Objective:   DG Chest 2 View  Result Date: 05/20/2021 CLINICAL DATA:  Wheezing EXAM: CHEST - 2 VIEW COMPARISON:  None. FINDINGS: Cardiomegaly with vascular congestion. Possible bronchitic changes. No consolidation, pleural effusion or pneumothorax IMPRESSION: 1. Cardiomegaly with slight central congestion 2. Mild bronchitic changes Electronically Signed   By: Jasmine Pang M.D.   On: 05/20/2021 19:24   VAS [place] LOWER EXTREMITY VENOUS (DVT)  Result Date: 05/21/2021  Lower Venous DVT Study Patient Name:  Brandon French  Date of Exam:   05/21/2021 Medical Rec #: [id number]       Accession #:    [id number] Date of Birth: 1966/05/25      Patient Gender: M Patient Age:   56 years Exam Location:  [facility] Procedure:      VAS [place] LOWER EXTREMITY VENOUS (DVT) Referring Phys: PAMELA LOVE --------------------------------------------------------------------------------  Indications: Stroke.  Limitations: Body habitus and poor ultrasound/tissue interface. Comparison Study: no prior Performing Technologist: [place] Ponder RVS  Examination Guidelines: A complete evaluation includes B-mode imaging, spectral Doppler, color Doppler, and power Doppler as needed of all accessible portions of each vessel. Bilateral testing is considered an integral part of a complete examination. Limited examinations for reoccurring indications may be performed as noted. The reflux portion of the exam is performed with the patient in reverse Trendelenburg.  +---------+---------------+---------+-----------+----------+-------------------+ RIGHT     CompressibilityPhasicitySpontaneityPropertiesThrombus Aging      +---------+---------------+---------+-----------+----------+-------------------+ CFV      Full           Yes      Yes                                      +---------+---------------+---------+-----------+----------+-------------------+ SFJ      Full                                                             +---------+---------------+---------+-----------+----------+-------------------+ FV Prox  Full                                                             +---------+---------------+---------+-----------+----------+-------------------+ FV Mid   Full                                                             +---------+---------------+---------+-----------+----------+-------------------+ FV  DistalFull                                                             +---------+---------------+---------+-----------+----------+-------------------+ PFV      Full                                                             +---------+---------------+---------+-----------+----------+-------------------+ POP      Full           Yes      Yes                                      +---------+---------------+---------+-----------+----------+-------------------+ PTV      Full                                                             +---------+---------------+---------+-----------+----------+-------------------+ PERO                                                  Not well visualized +---------+---------------+---------+-----------+----------+-------------------+   +---------+---------------+---------+-----------+----------+--------------+ LEFT     CompressibilityPhasicitySpontaneityPropertiesThrombus Aging +---------+---------------+---------+-----------+----------+--------------+ CFV      Full           Yes      Yes                                  +---------+---------------+---------+-----------+----------+--------------+ SFJ      Full                                                        +---------+---------------+---------+-----------+----------+--------------+ FV Prox  Full                                                        +---------+---------------+---------+-----------+----------+--------------+ FV Mid   Full                                                        +---------+---------------+---------+-----------+----------+--------------+ FV DistalFull                                                        +---------+---------------+---------+-----------+----------+--------------+  PFV      Full                                                        +---------+---------------+---------+-----------+----------+--------------+ POP      Full           Yes      Yes                                 +---------+---------------+---------+-----------+----------+--------------+ PTV      Full                                                        +---------+---------------+---------+-----------+----------+--------------+ PERO     Full                                                        +---------+---------------+---------+-----------+----------+--------------+     Summary: BILATERAL: - No evidence of deep vein thrombosis seen in the lower extremities, bilaterally. -No evidence of popliteal cyst, bilaterally.   *See table(s) above for measurements and observations. Electronically signed by Waverly Ferrari MD on 05/21/2021 at 3:59:41 PM.    Final    Recent Labs    05/21/21 0531  WBC 6.7  HGB 12.5*  HCT 41.1  PLT 337   Recent Labs    05/21/21 0531 05/22/21 0617  NA 139 141  K 4.2 3.6  CL 107 108  CO2 27 26  GLUCOSE 111* 110*  BUN 23* 24*  CREATININE 2.17* 2.33*  CALCIUM 8.5* 8.5*    Intake/Output Summary (Last 24 hours) at 05/22/2021 1034 Last data filed at 05/22/2021 0701 Gross  per 24 hour  Intake 595.33 ml  Output --  Net 595.33 ml        Physical Exam: Vital Signs Blood pressure (!) 158/75, pulse (!) 58, temperature 97.8 F (36.6 C), temperature source Oral, resp. rate 17, height 6' (1.829 m), weight (!) 147.6 kg, SpO2 98 %. Gen: no distress, normal appearing HEENT: oral mucosa pink and moist, NCAT Cardio: Bradycardic Chest: normal effort, normal rate of breathing Abd: soft, non-distended Ext: no edema Psych: pleasant, normal affect Musculoskeletal:     Cervical back: Normal range of motion and neck supple.     Comments: No edema in extremities LLE with some pain with manipulation  Skin:    General: Skin is warm and dry.  Neurological:     Mental Status: He is alert and oriented to person, place, and time.     Comments: Alert Severe dysarthria Able to answer biographic questions.  He was able to follow simple motor commands.  Sensation diminished to light touch LLE Motor: RUE/RLE; 5/5 proximal to distal LUE: 4/5 proximal to distal LLE: 3-/5 proximal to distal  Psychiatric:        Mood and Affect: Mood normal.        Behavior: Behavior normal.    Assessment/Plan: 1. Functional deficits which require 3+  hours per day of interdisciplinary therapy in a comprehensive inpatient rehab setting. Physiatrist is providing close team supervision and 24 hour management of active medical problems listed below. Physiatrist and rehab team continue to assess barriers to discharge/monitor patient progress toward functional and medical goals  Care Tool:  Bathing    Body parts bathed by patient: Right arm, Left arm, Chest, Abdomen, Front perineal area, Right upper leg, Left upper leg, Face   Body parts bathed by helper: Buttocks, Right lower leg, Left lower leg     Bathing assist Assist Level: Moderate Assistance - Patient 50 - 74%     Upper Body Dressing/Undressing Upper body dressing   What is the patient wearing?: Pull over shirt, Hospital gown  only    Upper body assist Assist Level: Minimal Assistance - Patient > 75%    Lower Body Dressing/Undressing Lower body dressing      What is the patient wearing?: Incontinence brief     Lower body assist Assist for lower body dressing: Maximal Assistance - Patient 25 - 49%     Toileting Toileting    Toileting assist Assist for toileting: Maximal Assistance - Patient 25 - 49%     Transfers Chair/bed transfer  Transfers assist     Chair/bed transfer assist level: Moderate Assistance - Patient 50 - 74%     Locomotion Ambulation   Ambulation assist      Assist level: Moderate Assistance - Patient 50 - 74% Assistive device: Walker-rolling Max distance: 25   Walk 10 feet activity   Assist     Assist level: Minimal Assistance - Patient > 75% Assistive device: Walker-rolling   Walk 50 feet activity   Assist Walk 50 feet with 2 turns activity did not occur: Safety/medical concerns         Walk 150 feet activity   Assist Walk 150 feet activity did not occur: Safety/medical concerns         Walk 10 feet on uneven surface  activity   Assist Walk 10 feet on uneven surfaces activity did not occur: Safety/medical concerns         Wheelchair     Assist Is the patient using a wheelchair?: No             Wheelchair 50 feet with 2 turns activity    Assist            Wheelchair 150 feet activity     Assist          Blood pressure (!) 158/75, pulse (!) 58, temperature 97.8 F (36.6 C), temperature source Oral, resp. rate 17, height 6' (1.829 m), weight (!) 147.6 kg, SpO2 98 %.    Medical Problem List and Plan: 1.  Deficits with mobility, speech, self-care secondary to acute left brain nonhemorrhagic infarct.              -patient may shower             -ELOS/Goals: Supervision/Mod I with PT/OT and Min A with SLP.            Continue CIR 2.  Impaired mobility: continue Lovenox. Dopplers reviewed and negative for  clots.              -antiplatelet therapy: DAPT x1 month followed by Plavix alone. 3. Pain Management: Tylenol as needed. 4. Mood: LCSW to follow for evaluation and support.             -antipsychotic agents: N/A 5. Neuropsych: This  patient is not capable of making decisions on his own behalf. 6. Skin/Wound Care: Routine pressure-relief measures. 7. Fluids/Electrolytes/Nutrition: Will monitor intake/output.              CMP ordered for tomorrow AM             -- Will need assistance/encouragement for intake. 8.  HTN: Monitor blood pressures 3 times daily 5-7 days.              --Avoid HTN with intracranial/basilar artery stenosis.             Monitor with increased exertion  -increase amlodipine to 10mg    Schedule hydralazine 10mg  TID 9.  T2DM with hyperglycemia: Hgb A1C- 6.6.  Was on metformin prior to admission which is being held due to CKD?             -- Monitor blood sugars achs and use SSI for elevated BS              Monitor with increased activity 10.  CKD stage IIIb: Serum creatinine 1.92 range per records.             -- increase IVF to 37mls/hr for 8 hours given uptrending creatinine, repeat tomorrow 11.  Possible HCOM: Severe asymmetric LV septal hypertrophy per echo             -- Will need referral to cardiology at discharge 12. 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 Will need assistance with fluid intake.  --CXR shows cardiomegaly with slight central congestion 13. Dyslipidemia: LDL 139. On Crestor.  14. Morbid obesity: Educate on importance of diet/wt loss to promote health and mobility.   15.BPH?: Indicating difficulty with voiding--will resume Proscar and Flomax.  --toilet every 3-4 hours/assist with urinal 16. Assisted, witnessed fall: patient assessed and without any negative consequences of fall.  17. Bradycardia: continue to monitor TID  LOS: 2 days A FACE TO FACE EVALUATION WAS PERFORMED   Pearley Baranek 05/22/2021, 10:34 AM

## 2021-05-22 NOTE — Progress Notes (Signed)
Occupational Therapy Session Note  Patient Details  Name: Brandon French MRN: [id number] Date of Birth: [birth year]  Today's Date: 05/23/2021 OT Individual Time: 2409-7353 OT Individual Time Calculation (min): 57 min   Short Term Goals: Week 1:  OT Short Term Goal 1 (Week 1): Pt will transfer to toilet wiht CGA OT Short Term Goal 2 (Week 1): Pt will don shirt wiht S OT Short Term Goal 3 (Week 1): Pt will stand to groom to demo improved activity tolernace OT Short Term Goal 4 (Week 1): Pt will thread BLE into pants wiht AE PRN  Skilled Therapeutic Interventions/Progress Updates:    Pt greeted in bed with no s/s pain. He used gestures and notepad mainly for communication though was able to say a few intelligible words. BP while supine 135/124. BP assessed again to verify accuracy with reading of 159/91. Pt shook his head when asked if he was dizzy. Notified RN of pts BP read and she provided pt with BP medicine at start of session. Pt transitioned EOB with setup assistance and then performed oral care using the suction toothbrush. He donned pants with overall Min A at sit<stand level using the RW, note that he did have some LOBs when attempting to pull pants over hips himself therefore needed assistance for this while he held onto the walker for stability. Mod A for donning shoes as well. He then completed stand pivot<BSC using RW with Min A. After he was assisted with clothing, pt sat on the [facility]. He was ultimately unable to void though brief was saturated with urine. Mod a for perihygiene and Total A for donning clean brief. Max A for toileting tasks overall. He then transferred EOB, BP 188/97. After a few minutes of seated rest, pt wanted to transfer to the w/c. Min A for stand pivot<w/c. Donned clean hospital gown, left him with all needs within reach and safety belt fastened. BP 160/86 prior to OT departure.   Therapy Documentation Precautions:  Precautions Precautions:  Fall Restrictions Weight Bearing Restrictions: No Pain Assessment Pain Scale: 0-10 Pain Score: 0-No pain ADL: ADL Grooming: Supervision/safety (suciton) Where Assessed-Grooming: Edge of bed Upper Body Bathing: Minimal assistance Where Assessed-Upper Body Bathing: Edge of bed Lower Body Bathing: Maximal assistance Where Assessed-Lower Body Bathing: Edge of bed Upper Body Dressing: Minimal assistance Where Assessed-Upper Body Dressing: Edge of bed Lower Body Dressing: Maximal assistance Where Assessed-Lower Body Dressing: Edge of bed Toileting: Maximal assistance (simulated from LB dressing) Where Assessed-Toileting: Toilet  Therapy/Group: Individual Therapy  Heather Mckendree A Elysia Grand 05/23/2021, 12:28 PM

## 2021-05-23 DIAGNOSIS — I63011 Cerebral infarction due to thrombosis of right vertebral artery: Secondary | ICD-10-CM

## 2021-05-23 DIAGNOSIS — I6389 Other cerebral infarction: Secondary | ICD-10-CM

## 2021-05-23 LAB — CBC
HCT: 38.1 % — ABNORMAL LOW (ref 39.0–52.0)
Hemoglobin: 11.7 g/dL — ABNORMAL LOW (ref 13.0–17.0)
MCH: 24.3 pg — ABNORMAL LOW (ref 26.0–34.0)
MCHC: 30.7 g/dL (ref 30.0–36.0)
MCV: 79 fL — ABNORMAL LOW (ref 80.0–100.0)
Platelets: 306 10*3/uL (ref 150–400)
RBC: 4.82 MIL/uL (ref 4.22–5.81)
RDW: 15.1 % (ref 11.5–15.5)
WBC: 5.9 10*3/uL (ref 4.0–10.5)
nRBC: 0 % (ref 0.0–0.2)

## 2021-05-23 LAB — GLUCOSE, CAPILLARY
Glucose-Capillary: 93 mg/dL (ref 70–99)
Glucose-Capillary: 118 mg/dL — ABNORMAL HIGH (ref 70–99)
Glucose-Capillary: 125 mg/dL — ABNORMAL HIGH (ref 70–99)
Glucose-Capillary: 112 mg/dL — ABNORMAL HIGH (ref 70–99)

## 2021-05-23 LAB — BASIC METABOLIC PANEL
Anion gap: 8 (ref 5–15)
BUN: 23 mg/dL — ABNORMAL HIGH (ref 6–20)
CO2: 25 mmol/L (ref 22–32)
Calcium: 8.3 mg/dL — ABNORMAL LOW (ref 8.9–10.3)
Chloride: 107 mmol/L (ref 98–111)
Creatinine, Ser: 2.19 mg/dL — ABNORMAL HIGH (ref 0.61–1.24)
GFR, Estimated: 35 mL/min — ABNORMAL LOW (ref >60)
Glucose, Bld: 98 mg/dL (ref 70–99)
Potassium: 3.5 mmol/L (ref 3.5–5.1)
Sodium: 140 mmol/L (ref 135–145)

## 2021-05-23 SURGERY — INSERTION, PEG TUBE
Anesthesia: Choice

## 2021-05-23 MED ORDER — METOPROLOL TARTRATE 12.5 MG HALF TABLET
12.5000 mg | ORAL_TABLET | Freq: Two times a day (BID) | ORAL | Status: DC
  Administered 2021-05-23 – 2021-06-06 (×26): 12.5 mg via ORAL
  Filled 2021-05-23 (×29): qty 1

## 2021-05-23 MED ORDER — CLOPIDOGREL BISULFATE 75 MG PO TABS
75.0000 mg | ORAL_TABLET | Freq: Every day | ORAL | Status: DC
  Administered 2021-05-24 – 2021-06-07 (×15): 75 mg via ORAL
  Filled 2021-05-23 (×15): qty 1

## 2021-05-23 NOTE — Progress Notes (Signed)
PROGRESS NOTE   Subjective/Complaints:  Pt states LLE weakness from first CVA in 2016 which occurred when pt living in [place]   ROS:No CP, SOB, N/V/D   Objective:   VAS [place] LOWER EXTREMITY VENOUS (DVT)  Result Date: 05/21/2021  Lower Venous DVT Study Patient Name:  Brandon French  Date of Exam:   05/21/2021 Medical Rec #: [id number]       Accession #:    [id number] Date of Birth: 1965/12/22      Patient Gender: M Patient Age:   55 years Exam Location:  [facility] Procedure:      VAS [place] LOWER EXTREMITY VENOUS (DVT) Referring Phys: PAMELA LOVE --------------------------------------------------------------------------------  Indications: Stroke.  Limitations: Body habitus and poor ultrasound/tissue interface. Comparison Study: no prior Performing Technologist: [place] Ponder RVS  Examination Guidelines: A complete evaluation includes B-mode imaging, spectral Doppler, color Doppler, and power Doppler as needed of all accessible portions of each vessel. Bilateral testing is considered an integral part of a complete examination. Limited examinations for reoccurring indications may be performed as noted. The reflux portion of the exam is performed with the patient in reverse Trendelenburg.  +---------+---------------+---------+-----------+----------+-------------------+ RIGHT    CompressibilityPhasicitySpontaneityPropertiesThrombus Aging      +---------+---------------+---------+-----------+----------+-------------------+ CFV      Full           Yes      Yes                                      +---------+---------------+---------+-----------+----------+-------------------+ SFJ      Full                                                             +---------+---------------+---------+-----------+----------+-------------------+ FV Prox  Full                                                              +---------+---------------+---------+-----------+----------+-------------------+ FV Mid   Full                                                             +---------+---------------+---------+-----------+----------+-------------------+ FV DistalFull                                                             +---------+---------------+---------+-----------+----------+-------------------+ PFV      Full                                                             +---------+---------------+---------+-----------+----------+-------------------+  POP      Full           Yes      Yes                                      +---------+---------------+---------+-----------+----------+-------------------+ PTV      Full                                                             +---------+---------------+---------+-----------+----------+-------------------+ PERO                                                  Not well visualized +---------+---------------+---------+-----------+----------+-------------------+   +---------+---------------+---------+-----------+----------+--------------+ LEFT     CompressibilityPhasicitySpontaneityPropertiesThrombus Aging +---------+---------------+---------+-----------+----------+--------------+ CFV      Full           Yes      Yes                                 +---------+---------------+---------+-----------+----------+--------------+ SFJ      Full                                                        +---------+---------------+---------+-----------+----------+--------------+ FV Prox  Full                                                        +---------+---------------+---------+-----------+----------+--------------+ FV Mid   Full                                                        +---------+---------------+---------+-----------+----------+--------------+ FV DistalFull                                                         +---------+---------------+---------+-----------+----------+--------------+ PFV      Full                                                        +---------+---------------+---------+-----------+----------+--------------+ POP      Full           Yes      Yes                                 +---------+---------------+---------+-----------+----------+--------------+  PTV      Full                                                        +---------+---------------+---------+-----------+----------+--------------+ PERO     Full                                                        +---------+---------------+---------+-----------+----------+--------------+     Summary: BILATERAL: - No evidence of deep vein thrombosis seen in the lower extremities, bilaterally. -No evidence of popliteal cyst, bilaterally.   *See table(s) above for measurements and observations. Electronically signed by Waverly Ferrari MD on 05/21/2021 at 3:59:41 PM.    Final    Recent Labs    05/21/21 0531 05/23/21 0524  WBC 6.7 5.9  HGB 12.5* 11.7*  HCT 41.1 38.1*  PLT 337 306    Recent Labs    05/22/21 0617 05/23/21 0524  NA 141 140  K 3.6 3.5  CL 108 107  CO2 26 25  GLUCOSE 110* 98  BUN 24* 23*  CREATININE 2.33* 2.19*  CALCIUM 8.5* 8.3*     Intake/Output Summary (Last 24 hours) at 05/23/2021 1206 Last data filed at 05/23/2021 0753 Gross per 24 hour  Intake 1637.28 ml  Output --  Net 1637.28 ml         Physical Exam: Vital Signs Blood pressure (!) 170/92, pulse 80, temperature 98.6 F (37 C), temperature source Oral, resp. rate 18, height 6' (1.829 m), weight (!) 147.6 kg, SpO2 100 %.  General: No acute distress Mood and affect are appropriate Heart: Regular rate and rhythm no rubs murmurs or extra sounds Lungs: Clear to auscultation, breathing unlabored, no rales or wheezes Abdomen: Positive bowel sounds, soft nontender to palpation, nondistended Extremities: No  clubbing, cyanosis, or edema Skin: No evidence of breakdown, no evidence of rash  Musculoskeletal:     Cervical back: Normal range of motion and neck supple.     Comments: No edema in extremities LLE with some pain with manipulation  Skin:    General: Skin is warm and dry.  Neurological:     Mental Status: He is alert and oriented to person, place, and time.     Comments: Alert Severe dysarthria Able to answer biographic questions.  He was able to follow simple motor commands.  Sensation diminished to light touch LLE Motor: RUE/RLE; 5/5 proximal to distal LUE: 4/5 proximal to distal LLE: 3-/5 proximal to distal  Psychiatric:        Mood and Affect: Mood normal.        Behavior: Behavior normal.    Assessment/Plan: 1. Functional deficits which require 3+ hours per day of interdisciplinary therapy in a comprehensive inpatient rehab setting. Physiatrist is providing close team supervision and 24 hour management of active medical problems listed below. Physiatrist and rehab team continue to assess barriers to discharge/monitor patient progress toward functional and medical goals  Care Tool:  Bathing    Body parts bathed by patient: Right arm, Left arm, Chest, Abdomen, Front perineal area, Right upper leg, Left upper leg, Face   Body parts bathed by helper: Buttocks, Right lower leg, Left lower  leg     Bathing assist Assist Level: Moderate Assistance - Patient 50 - 74%     Upper Body Dressing/Undressing Upper body dressing   What is the patient wearing?: Hospital gown only    Upper body assist Assist Level: Minimal Assistance - Patient > 75%    Lower Body Dressing/Undressing Lower body dressing      What is the patient wearing?: Incontinence brief     Lower body assist Assist for lower body dressing: Maximal Assistance - Patient 25 - 49%     Toileting Toileting    Toileting assist Assist for toileting: Maximal Assistance - Patient 25 - 49%      Transfers Chair/bed transfer  Transfers assist  Chair/bed transfer activity did not occur: Refused  Chair/bed transfer assist level: 2 Helpers     Locomotion Ambulation   Ambulation assist      Assist level: Moderate Assistance - Patient 50 - 74% Assistive device: Walker-rolling Max distance: 25   Walk 10 feet activity   Assist     Assist level: Minimal Assistance - Patient > 75% Assistive device: Walker-rolling   Walk 50 feet activity   Assist Walk 50 feet with 2 turns activity did not occur: Safety/medical concerns         Walk 150 feet activity   Assist Walk 150 feet activity did not occur: Safety/medical concerns         Walk 10 feet on uneven surface  activity   Assist Walk 10 feet on uneven surfaces activity did not occur: Safety/medical concerns         Wheelchair     Assist Is the patient using a wheelchair?: No             Wheelchair 50 feet with 2 turns activity    Assist            Wheelchair 150 feet activity     Assist          Blood pressure (!) 170/92, pulse 80, temperature 98.6 F (37 C), temperature source Oral, resp. rate 18, height 6' (1.829 m), weight (!) 147.6 kg, SpO2 100 %.    Medical Problem List and Plan: 1.  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              -patient may shower             -ELOS/Goals: Supervision/Mod I with PT/OT and Min A with SLP.            Continue CIR 2.  Impaired mobility: continue Lovenox. Dopplers reviewed and negative for clots.              -antiplatelet therapy: DAPT x1 month followed by Plavix alone. 3. Pain Management: Tylenol as needed. 4. Mood: LCSW to follow for evaluation and support.             -antipsychotic agents: N/A 5. Neuropsych: This patient is not capable of making decisions on his own behalf. 6. Skin/Wound Care: Routine pressure-relief measures. 7.  Fluids/Electrolytes/Nutrition: Will monitor intake/output.              CMP ordered for tomorrow AM             -- Will need assistance/encouragement for intake. 8.  HTN: Monitor blood pressures 3 times daily 5-7 days.              -was on Toprol XL 100mg  , Clonidine  0.1mg  BID PTA             Monitor with increased exertion  -increase amlodipine to 10mg    Schedule hydralazine 10mg  TID Vitals:   05/22/21 1952 05/23/21 0339  BP: (!) 167/75 (!) 170/92  Pulse: 88 80  Resp: 18 18  Temp: 98.6 F (37 C) 98.6 F (37 C)  SpO2: 100% 100%   Discussed with OT, BPs have been persistently elevated today but overall improving will change IV to .45NS  which should help reduce BP  Will start metoprolol tartrate 12.5mg  BID 9.  T2DM with hyperglycemia: Hgb A1C- 6.6.  Was on metformin prior to admission which is being held due to CKD?             -- Monitor blood sugars achs and use SSI for elevated BS         CBG (last 3)  Recent Labs    05/22/21 2101 05/23/21 0558 05/23/21 1147  GLUCAP 159* 93 118*  Controlled   10.  CKD stage IIIb: Serum creatinine 1.92 range per records., slightly above baseline              -- increase IVF to 87mls/hr for 8 hours given uptrending creatinine, repeat sl better  Change to .45 NS to reduce osmolarity  11.  Possible HCOM: Severe asymmetric LV septal hypertrophy per echo             -- Will need referral to cardiology at discharge 12. 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 Will need assistance with fluid intake.  --CXR shows cardiomegaly with slight central congestion 13. Dyslipidemia: LDL 139. On Crestor.  14. Morbid obesity: Educate on importance of diet/wt loss to promote health and mobility.   15.BPH?: Indicating difficulty with voiding--will resume Proscar and Flomax.  --toilet every 3-4 hours/assist with urinal 16. Assisted, witnessed fall: patient assessed and without any negative consequences of fall.  17.  Bradycardia: continue to monitor TID  LOS: 3 days A FACE TO FACE EVALUATION WAS PERFORMED  05/25/21 05/23/2021, 12:06 PM

## 2021-05-23 NOTE — IPOC Note (Signed)
Overall Plan of Care [facility]) Patient Details Name: Brandon French MRN: [id number] DOB: 12/11/1965  Admitting Diagnosis: CVA (cerebral vascular accident) [facility])  Hospital Problems: Principal Problem:   CVA (cerebral vascular accident) [facility])     Functional Problem List: Nursing Bladder, Medication Management, Safety, Bowel, Nutrition, Endurance  PT Balance, Behavior, Endurance, Motor, Nutrition, Pain, Perception, Safety, Sensory, Skin Integrity  OT Balance, Cognition, Endurance, Motor, Nutrition, Safety, Vision  SLP Linguistic, Nutrition  TR         Basic ADL's: OT Grooming, Bathing, Dressing, Toileting     Advanced  ADL's: OT       Transfers: PT Bed Mobility, Bed to Chair, Car, Occupational psychologist, Research scientist (life sciences): PT Ambulation, Stairs     Additional Impairments: OT Fuctional Use of Upper Extremity  SLP Swallowing, Communication expression    TR      Anticipated Outcomes Item Anticipated Outcome  Self Feeding S  Swallowing  supervision   Basic self-care  S  Toileting  S   Bathroom Transfers s  Bowel/Bladder  manage bowel and bladder w mod I assist  Transfers  spv  Locomotion  spv  Communication  min assist  Cognition     Pain  n/a  Safety/Judgment  maintain w cues/reminders   Therapy Plan: PT Intensity: Minimum of 1-2 x/day ,45 to 90 minutes PT Frequency: 5 out of 7 days PT Duration Estimated Length of Stay: 12-14 OT Intensity: Minimum of 1-2 x/day, 45 to 90 minutes OT Frequency: 5 out of 7 days OT Duration/Estimated Length of Stay: 14-16 SLP Intensity: Minumum of 1-2 x/day, 30 to 90 minutes SLP Frequency: 3 to 5 out of 7 days SLP Duration/Estimated Length of Stay: 14-16 days   Due to the current state of emergency, patients may not be receiving their 3-hours of Medicare-mandated therapy.   Team Interventions: Nursing Interventions Bladder Management, Disease Management/Prevention, Medication Management, Discharge Planning,  Bowel Management, Patient/Family Education, Dysphagia/Aspiration Precaution Training  PT interventions Ambulation/gait training, Cognitive remediation/compensation, Discharge planning, DME/adaptive equipment instruction, Functional mobility training, Pain management, Psychosocial support, Warden/ranger, Community reintegration, Disease management/prevention, Functional electrical stimulation, Neuromuscular re-education, Patient/family education, Skin care/wound management, Stair training, Therapeutic Exercise, UE/LE Coordination activities, Wheelchair propulsion/positioning, Splinting/orthotics, Therapeutic Activities, UE/LE Strength taining/ROM, Visual/perceptual remediation/compensation  OT Interventions Balance/vestibular training, Discharge planning, Pain management, Self Care/advanced ADL retraining, Therapeutic Activities, UE/LE Coordination activities, Visual/perceptual remediation/compensation, Therapeutic Exercise, Skin care/wound managment, Patient/family education, Functional mobility training, Disease mangement/prevention, Cognitive remediation/compensation, Firefighter, Fish farm manager, Neuromuscular re-education, Psychosocial support, UE/LE Strength taining/ROM, Wheelchair propulsion/positioning  SLP Interventions Cueing hierarchy, Dysphagia/aspiration precaution training, Speech/Language facilitation, Internal/external aids, Multimodal communication approach, Environmental controls  TR Interventions    SW/CM Interventions Discharge Planning, Patient/Family Education, Psychosocial Support, Disease Management/Prevention   Barriers to Discharge MD  Medical stability and Weight  Nursing Decreased caregiver support, Home environment access/layout, Nutrition means, Insurance for SNF coverage 1 level rampted  to porch  and 1ste with parents  PT Decreased caregiver support h/o prior cva  OT Decreased caregiver support, Weight    SLP Decreased  caregiver support Pt was caring for his parents prior to admission due to father suffering a recent fall with hip injury, mom has difficulty seeing and hearing, brother who is local has cancer with poor prognosis per pt.  SW       Team Discharge Planning: Destination: PT-Home ,OT- Home , SLP-Home Projected Follow-up: PT-Home health PT, 24 hour supervision/assistance, OT-  Home health OT, Outpatient OT, SLP-Home Health  SLP, Outpatient SLP Projected Equipment Needs: PT-To be determined, OT- 3 in 1 bedside comode, Tub/shower bench, SLP-To be determined Equipment Details: PT- , OT-  Patient/family involved in discharge planning: PT- Patient,  OT-Patient, SLP-Patient  MD ELOS: 12-16d Medical Rehab Prognosis:  Good Assessment: 55 year old RH-male with history of HTN, T2DM, BPH?, who was admitted to [facility] on 05/16/2021 after waking up with slurred speech and left hemiparesis. He was drooling and reported to have trouble swallowing. 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  Echocardiogram with EF of EF 65-70% and sevvere asymmetric left ventricular hypertrophy of septal segment--cardiology referral recommended.  Dr. Gerilyn Pilgrim consulted for input and recommended aspirin/Plavix for 1 month followed by Plavix alone.   NPO. recommended due to severe dysphagia with inability to handle secretions.  MBS done on showing severe oral effusion mild to moderate pharyngeal phase dysphagia with penetration and aspiration of thin and nectar and honey liquids.  He was  able to tolerate honey thick by teaspoon and needed verbal cues to try and engage on phase.  PEG tube was recommended due to concerns of ability to maintain adequate nutrition.  Therapy ongoing and patient limited by left-sided hemiparesis with balance deficits and fatigue.  CIR was recommended due to functional decline. Please see preadmission assessment from earlier today as well.     See Team Conference Notes for weekly updates to the plan of care

## 2021-05-23 NOTE — Progress Notes (Signed)
Speech Language Pathology Daily Session Note  Patient Details  Name: Brandon French MRN: [id number] Date of Birth: 1965/12/03  Today's Date: 05/23/2021 SLP Individual Time: 1400-1455 SLP Individual Time Calculation (min): 55 min  Short Term Goals: Week 1: SLP Short Term Goal 1 (Week 1): Pt will consume presentations of honey thick liquids via teaspoon and dys 1 textures with supervision cues for use of swallowing precautions. SLP Short Term Goal 2 (Week 1): Pt will consume therapeutic trials of honey thick liquids via cup sips with min cues for use of swallowing precautions and minimal overt s/s of aspiration. SLP Short Term Goal 3 (Week 1): Pt will utilize overarticulation and loud voice to achieve intelligibilty at the word level in ~50% of opportunities during structured tasks.   Skilled Therapeutic Interventions: Skilled treatment session focused on communication and dysphagia goals. SLP facilitated session by providing skilled observation with honey-thick liquids via tsp. Patient with intermittent overt s/s of aspiration noted with Mod verbal cues needed for small tsp sips and attention to swallow initiation prior to talking, etc. Recommend patient continue current diet with full supervision. SLP also facilitated session by providing Max A multimodal cues for use of speech intelligibility strategies at the phrase level to achieve 50-65% intelligibility. Patient would independently utilize written expression during communication breakdowns but would need cues to attempt verbal expression prior to writing. Max A multimodal cues were also needed for use of diaphragmatic breathing while sitting upright. Patient left upright in wheelchair with alarm on and all needs within reach. Continue with current plan of care.      Pain No/Denies Pain   Therapy/Group: Individual Therapy  Kuulei Kleier 05/23/2021, 3:24 PM

## 2021-05-23 NOTE — Progress Notes (Signed)
**  Late Entry**  05/22/21 0200  What Happened  Was fall witnessed? Yes  Who witnessed fall? Ladona Horns, LPN  Patients activity before fall other (comment) (attempted to transfer from chair to bed)  Point of contact buttocks;other (comment) (Landed on nurses feet)  Was patient injured? No  Follow Up  Family notified No - patient refusal  Progress note created (see row info) Yes  Adult Fall Risk Assessment  Risk Factor Category (scoring not indicated) Fall has occurred during this admission (document High fall risk)  Age 55  Fall History: Fall within 6 months prior to admission 0  Elimination; Bowel and/or Urine Incontinence 2  Elimination; Bowel and/or Urine Urgency/Frequency 2  Medications: includes PCA/Opiates, Anti-convulsants, Anti-hypertensives, Diuretics, Hypnotics, Laxatives, Sedatives, and Psychotropics 3  Patient Care Equipment 0  Mobility-Assistance 2  Mobility-Gait 2  Mobility-Sensory Deficit 0  Altered awareness of immediate physical environment 0  Impulsiveness 0  Lack of understanding of one's physical/cognitive limitations 0  Total Score 11  Patient Fall Risk Level High fall risk  Adult Fall Risk Interventions  Required Bundle Interventions *See Row Information* High fall risk - low, moderate, and high requirements implemented  Additional Interventions PT/OT need assessed if change in mobility from baseline;Lap belt while in chair/wheelchair (Rehab only);Use of appropriate toileting equipment (bedpan, BSC, etc.)  Screening for Fall Injury Risk (To be completed on HIGH fall risk patients) - Assessing Need for Floor Mats  Risk For Fall Injury- Criteria for Floor Mats Previous fall this admission  Will Implement Floor Mats Yes  Vitals  Temp 98 F (36.7 C)  Temp Source Oral  BP (!) 167/83  MAP (mmHg) 106  BP Location Right Arm  BP Method Automatic  Patient Position (if appropriate) Lying  Pulse Rate 83  Pulse Rate Source Dinamap  Resp 18  Oxygen Therapy   SpO2 94 %  O2 Device Room Air  Pain Assessment  Pain Scale 0-10  Pain Score 3  Pain Type Acute pain;Other (Comment) (Not related to fall)  Pain Location Leg  Pain Orientation Left  Pain Descriptors / Indicators Burning  Pain Frequency Intermittent  Pain Onset On-going  Pain Intervention(s) Medication (See eMAR)  PCA/Epidural/Spinal Assessment  Respiratory Pattern Regular;Unlabored  Neurological  Neuro (WDL) X  Level of Consciousness Alert  Orientation Level Oriented X4  Cognition Follows commands  Speech Slurred/Dysarthria  Pupil Assessment  No  Motor Function/Sensation Assessment Grip;Motor response;Motor strength  R Hand Grip Moderate  L Hand Grip Weak   Right Pronator Drift Absent  Left Pronator Drift Absent  R Foot Dorsiflexion Moderate  L Foot Dorsiflexion Weak  R Foot Plantar Flexion Moderate  L Foot Plantar Flexion Weak  Neuro Symptoms None  Musculoskeletal  Musculoskeletal (WDL) X  Assistive Device Wheelchair;Front wheel walker  Generalized Weakness Yes  Weight Bearing Restrictions No  Integumentary  Integumentary (WDL) X  Skin Color Appropriate for ethnicity  Skin Condition Dry  Skin Integrity Intact  Ecchymosis Location Other (Comment) (scattered)  Ecchymosis Location Orientation Right;Left  Ecchymosis Intervention Other (Comment) (assessed)  Skin Turgor Non-tenting  Pain Assessment  Date Pain First Started 05/19/21  Result of Injury No

## 2021-05-23 NOTE — Progress Notes (Signed)
Physical Therapy Session Note  Patient Details  Name: Brandon French MRN: [id number] Date of Birth: 1965-09-01  Today's Date: 05/23/2021 PT Individual Time: 1031-1130 PT Individual Time Calculation (min): 59 min   Short Term Goals: Week 1:  PT Short Term Goal 1 (Week 1): Patient will complete be dmobility with CGA consistently PT Short Term Goal 2 (Week 1): Patient will transfer with LRAD bed <> wc with CGA consistently PT Short Term Goal 3 (Week 1): Patient will ambulate >50 with LRAD with CGA consistently  Skilled Therapeutic Interventions/Progress Updates:  Session 1: Patient seated upright in w/c on entrance to room. Patient alert and agreeable to PT session. When he finds it difficult to enunciate, he does well to write out his intended thought.    Patient with no pain complaint throughout session.  Therapeutic Activity: Transfers: Patient performed sit<>stand from w/c to RW requiring Mod A for power up and overcoming posterior bias. Pivot stepping with RW performed with Min/ CGA . Following NMR focus on sit<>stand, pt improves to MinA for sit/ stand for forward lean and is able to perform one sit <> stand to no AD and attain standing balance with no UE support. Provided verbal cues for improving forward lean throughout.  Gait Training:  Patient ambulated 1' x1/ >200' x1 using RW with CGA. Demonstrated slow pace and decreased LLE step height/ length requiring vc for increased hip/ knee flexion as well as increased ankle DF.  Neuromuscular Re-ed: NMR facilitated during session with focus on standing balance, equal BLE muscle activation, midline orientation. Pt guided in NDT facilitation to improve forward lean with continued posterior bias in rise to stand. With guidance into anterior lean with rise to stand, pt is able to perform with CGA. Initially, pt falls back into sitting with descent to sit and guided into forward lean prior to sit for improved technique and CGA. Pt able to  perform 1x10 minisquats with RW and 1x10 with no AD. Requires heavy tc initially for adequate forward lean for unweighting. Throughout session amount of tc decreases and vc increases.  NMR performed for improvements in motor control and coordination, balance, sequencing, judgement, and self confidence/ efficacy in performing all aspects of mobility at highest level of independence.   Patient seated upright in w/c at end of session with brakes locked, belt alarm set, and all needs within reach. Pt provided with one spoon of nectar thickened water at end of session with good mouth clearance demonstrated by pt. Water left for NT to complete supervision.      Session 2: Patient seated upright in w/c on entrance to room. Just completing speech therapy session. SLP recommends to encourage pt to use whole mouth to enunciate rather than write to express self. Also recommends to work on diaphragmatic breathing. Patient alert and agreeable to PT session.   Patient with no pain complaint throughout session.  Therapeutic Activity: Pt guided in placement of one hand over chest and one hand over belly with encouragement to take deep breaths into belly making only hand on belly rise and not hand over chest/ lungs. Then to push air out of lungs, encouraged pt to use muscles to make belly smaller, bring hand lower than hand on chest on exhale. Pt is able to perform a few times prior to relating that he needs to use the bathroom.   Transfers: Patient performed sit<>stand and stand pivot transfer to reach toilet using safety rails in bathroom and completing with CGA. Pt is able to  maintain stance to use LUE to doff pants, then descend to sit. Upon finishing toileting, pt provided with washcloth to clean and is able to clean front side while seated with setup and backside with CGA while standing. Pt removed his own brief while seated and new brief donned with Max A. Pt able to pull brief up, then pants while standing with  CGA/ minA. Transfer to w/c using safety rail with CGA. Standing at sink to wash hands with CGA. Provided verbal cues for forward lean during sit<>stand transfers as pt continues to push BLE against seat to assist with forward propulsion.  Provided with teaspoon and honey thick water during session. Encouraged to only take one teaspoon at a time as pt tends to choke/ cough when he tries to swallow more than one or takes too many spoonfuls in succession.  Patient seated  in w/c at end of session with brakes locked, belt alarm set, and all needs within reach.   Therapy Documentation Precautions:  Precautions Precautions: Fall Restrictions Weight Bearing Restrictions: No  Pain: Pain Assessment Pain Scale: 0-10 Pain Score: 0-No pain  Therapy/Group: Individual Therapy  Loel Dubonnet PT, DPT 05/23/2021, 12:20 PM

## 2021-05-23 NOTE — Progress Notes (Signed)
Inpatient Rehabilitation Center Individual Statement of Services  Patient Name:  Brandon French  Date:  05/23/2021  Welcome to the Inpatient Rehabilitation Center.  Our goal is to provide you with an individualized program based on your diagnosis and situation, designed to meet your specific needs.  With this comprehensive rehabilitation program, you will be expected to participate in at least 3 hours of rehabilitation therapies Monday-Friday, with modified therapy programming on the weekends.  Your rehabilitation program will include the following services:  Physical Therapy (PT), Occupational Therapy (OT), Speech Therapy (ST), 24 hour per day rehabilitation nursing, Therapeutic Recreaction (TR), Neuropsychology, Care Coordinator, Rehabilitation Medicine, Nutrition Services, Pharmacy Services, and Other  Weekly team conferences will be held on Wednesdays to discuss your progress.  Your Inpatient Rehabilitation Care Coordinator will talk with you frequently to get your input and to update you on team discussions.  Team conferences with you and your family in attendance may also be held.  Expected length of stay:  10-12 Days  Overall anticipated outcome:  MOD I to Supervision  Depending on your progress and recovery, your program may change. Your Inpatient Rehabilitation Care Coordinator will coordinate services and will keep you informed of any changes. Your Inpatient Rehabilitation Care Coordinator's name and contact numbers are listed  below.  The following services may also be recommended but are not provided by the Inpatient Rehabilitation Center:   Home Health Rehabiltiation Services Outpatient Rehabilitation Services    Arrangements will be made to provide these services after discharge if needed.  Arrangements include referral to agencies that provide these services.  Your insurance has been verified to be:  [employer] Your primary doctor is:  NO PCP  Pertinent information will  be shared with your doctor and your insurance company.  Inpatient Rehabilitation Care Coordinator:  Lavera Guise, [place] [phone] or [phone]  Information discussed with and copy given to patient by: Andria Rhein, 05/23/2021, 12:17 PM

## 2021-05-23 NOTE — Progress Notes (Signed)
Inpatient Rehabilitation  Patient information reviewed and entered into eRehab system by Jabier Deese Tyge Somers, OTR/L.   Information including medical coding, functional ability and quality indicators will be reviewed and updated through discharge.    

## 2021-05-23 NOTE — Progress Notes (Signed)
Inpatient Rehabilitation Care Coordinator Assessment and Plan Patient Details  Name: Brandon French MRN: [id number] Date of Birth: May 07, 1966  Today's Date: 05/23/2021  Hospital Problems: Principal Problem:   CVA (cerebral vascular accident) [facility])  Past Medical History:  Past Medical History:  Diagnosis Date   Diabetes mellitus ([place])    Elevated cholesterol    High blood pressure    Past Surgical History: History reviewed. No pertinent surgical history. Social History:  reports that he has never smoked. He has never used smokeless tobacco. He reports that he does not drink alcohol and does not use drugs.  Family / Support Systems Marital Status: Single Other Supports: Optician, dispensing (Father), Primary school teacher (Mother) Anticipated Caregiver: Parents Ability/Limitations of Caregiver: late 7s , supervision Caregiver Availability: 24/7 Family Dynamics: support from parents and children as needed  Social History Preferred language: English Religion:  Health Literacy - How often do you need to have someone help you when you read instructions, pamphlets, or other written material from your doctor or pharmacy?: Never Writes: Yes Employment Status: Employed Public relations account executive Issues: N/A Guardian/Conservator: N/A   Abuse/Neglect Abuse/Neglect Assessment Can Be Completed: Yes Physical Abuse: Denies Verbal Abuse: Denies Sexual Abuse: Denies Exploitation of patient/patient's resources: Denies Self-Neglect: Denies  Patient response to: Social Isolation - How often do you feel lonely or isolated from those around you?: Never  Emotional Status Pt's affect, behavior and adjustment status: pt pleasant Recent Psychosocial Issues: n/a Psychiatric History: N/A Substance Abuse History: N/A  Patient / Family Perceptions, Expectations & Goals Pt/Family understanding of illness & functional limitations: yes Premorbid pt/family roles/activities: Previously independent and  driving Anticipated changes in roles/activities/participation: family helping at discharge. Parents and 2 adult children in [place] Pt/family expectations/goals: MOD I to [place]: None Premorbid Home Care/DME Agencies:  ([employer], Conservation officer, nature) Transportation available at discharge: Family able to transport Is the patient able to respond to transportation needs?: Yes In the past 12 months, has lack of transportation kept you from medical appointments or from getting medications?: No In the past 12 months, has lack of transportation kept you from meetings, work, or from getting things needed for daily living?: No  Discharge Planning Living Arrangements: Parent Support Systems: Children, Parent Type of Residence: Private residence Insurance Resources: Multimedia programmer (specify) ([employer]) Financial Resources: Employment Living Expenses: Lives with family Money Management: Patient, Family Does the patient have any problems obtaining your medications?: No Home Management: Independent Patient/Family Preliminary Plans: Parents and children to assist at d/c Care Coordinator Anticipated Follow Up Needs: HH/OP Expected length of stay: 10-12 Days  Clinical Impression SW met with patient, introduced self, explained role. SW left contact information for pt family. Pt plans to discharge home with his parents. Parents in late 70s able to provide supervision. Father on HD. No additional questions or concerns, sw will continue to follow up with pt and parents.   Brandon French 05/23/2021, 12:43 PM

## 2021-05-24 LAB — GLUCOSE, CAPILLARY
Glucose-Capillary: 113 mg/dL — ABNORMAL HIGH (ref 70–99)
Glucose-Capillary: 135 mg/dL — ABNORMAL HIGH (ref 70–99)
Glucose-Capillary: 109 mg/dL — ABNORMAL HIGH (ref 70–99)
Glucose-Capillary: 167 mg/dL — ABNORMAL HIGH (ref 70–99)

## 2021-05-24 MED ORDER — GABAPENTIN 100 MG PO CAPS
100.0000 mg | ORAL_CAPSULE | Freq: Every day | ORAL | Status: DC
  Administered 2021-05-24 – 2021-06-06 (×14): 100 mg via ORAL
  Filled 2021-05-24 (×14): qty 1

## 2021-05-24 NOTE — Progress Notes (Signed)
Speech Language Pathology Daily Session Note  Patient Details  Name: Brandon French MRN: [id number] Date of Birth: [birth year]  Today's Date: 05/24/2021 SLP Individual Time: 7412-8786 SLP Individual Time Calculation (min): 45 min  Short Term Goals: Week 1: SLP Short Term Goal 1 (Week 1): Pt will consume presentations of honey thick liquids via teaspoon and dys 1 textures with supervision cues for use of swallowing precautions. SLP Short Term Goal 2 (Week 1): Pt will consume therapeutic trials of honey thick liquids via cup sips with min cues for use of swallowing precautions and minimal overt s/s of aspiration. SLP Short Term Goal 3 (Week 1): Pt will utilize overarticulation and loud voice to achieve intelligibilty at the word level in ~50% of opportunities during structured tasks. SLP Short Term Goal 4 (Week 1): Pt will utilize multimodal communication during communication breakdowns with Min verbal cues.  Skilled Therapeutic Interventions: Pt received semi-reclined in bed and agreeable to skilled ST intervention with focus on swallownig and communication goals. SLP facilitated session by providing skilled observation with consumption of Dys 1 textures and honey thick liquids by tsp. Pt without overt s/sx of aspiration across entirety of meal. Pt initially easily distracted by his phone and talking to therapist with bolus still in oral cavity and required mod A fading to min A verbal cues to minimize talking and for attention to meal. Continue current diet and full supervision at this time. SLP also facilitated session by providing mod-to-max A multimodal cues for use of speech intelligibility strategies at phrase level to achieve ~50% intelligibility to this unknown listener. Pt independently reported diaphragmatic breathing discussed during previous session and expressed "my speech is better today" and was able to have brief phone conversation with his brother that went well per pt report. Pt known  to use gestures to repair communication breakdowns this date. Patient was passed off to NT at end of session to continue full supervision with meal. Continue per current plan of care.      Pain Pain Assessment Pain Scale: 0-10 Pain Score: 0-No pain  Therapy/Group: Individual Therapy  Jahnay Lantier T Sanskriti Greenlaw 05/24/2021, 8:21 AM

## 2021-05-24 NOTE — Progress Notes (Addendum)
Occupational Therapy Session Note  Patient Details  Name: Brandon French MRN: [id number] Date of Birth: [birth year]  Today's Date: 05/24/2021 OT Individual Time: 1101-1158 OT Individual Time Calculation (min): 57 min    Short Term Goals: Week 1:  OT Short Term Goal 1 (Week 1): Pt will transfer to toilet wiht CGA OT Short Term Goal 2 (Week 1): Pt will don shirt wiht S OT Short Term Goal 3 (Week 1): Pt will stand to groom to demo improved activity tolernace OT Short Term Goal 4 (Week 1): Pt will thread BLE into pants wiht AE PRN  Skilled Therapeutic Interventions/Progress Updates:    Pt received seated in recliner, denies pain, agreeable to therapy. Session focus on self-care retraining, activity tolerance, BUE strengthening, dynamic standing balance in prep for improved ADL/IADL/func mobility performance + decreased caregiver burden. Agreeable to change shirt as shirt was dirty. Doffed and donned new shirt with S + increased time. Required cues to use suction to manage oral secretions. Self-propelled w/c > nurses station with min A to prevent L veering, slow BUE strokes.   Stand-pivot > mat table with RW and light min A due to posterior bias, required multiple attempts and cues for split hand technique and B foot placement. Practiced side-stepping up and down mat with min A for AD management as pt tends to not move RW with him.  BP post activity read at 147/77 (97).  Completed 2x10 modified sit-ups with incorporate BUE toss. Good BUE coordination to catch and toss ball efficiently. Completed 1x10 of the following with 2 lb dowel rod: chest press, forward shoulder flexion, forward/backward circles.  In room, completed seated oral care with S with use of suction toothbrush.   Pt left seated in w/c with safety belt alarm engaged, call bell in reach, and all immediate needs met.    Therapy Documentation Precautions:  Precautions Precautions: Fall Restrictions Weight Bearing Restrictions:  No  Pain: Pain Assessment Pain Scale: 0-10 Pain Score: 0-No pain ADL: See Care Tool for more details.   Therapy/Group: Individual Therapy  Volanda Napoleon MS, OTR/L  05/24/2021, 6:42 AM

## 2021-05-24 NOTE — Progress Notes (Signed)
PROGRESS NOTE   Subjective/Complaints:  No issues overnite, except Left midfoot pain x last 2-3 nights.  Pt states it feels like "nerve pain".  No pain with WB per PT   ROS:No CP, SOB, N/V/D   Objective:   No results found. Recent Labs    05/23/21 0524  WBC 5.9  HGB 11.7*  HCT 38.1*  PLT 306    Recent Labs    05/22/21 0617 05/23/21 0524  NA 141 140  K 3.6 3.5  CL 108 107  CO2 26 25  GLUCOSE 110* 98  BUN 24* 23*  CREATININE 2.33* 2.19*  CALCIUM 8.5* 8.3*     Intake/Output Summary (Last 24 hours) at 05/24/2021 0915 Last data filed at 05/23/2021 2215 Gross per 24 hour  Intake 423.12 ml  Output 400 ml  Net 23.12 ml         Physical Exam: Vital Signs Blood pressure (!) 154/85, pulse 63, temperature 98.9 F (37.2 C), temperature source Oral, resp. rate 16, height 6' (1.829 m), weight (!) 147.6 kg, SpO2 98 %.  General: No acute distress Mood and affect are appropriate Heart: Regular rate and rhythm no rubs murmurs or extra sounds Lungs: Clear to auscultation, breathing unlabored, no rales or wheezes Abdomen: Positive bowel sounds, soft nontender to palpation, nondistended Extremities: No clubbing, cyanosis, or edema Skin: No evidence of breakdown, no evidence of rash  Musculoskeletal:     Cervical back: Normal range of motion and neck supple.     Comments: No edema in extremities No pain to palpation Left mid foot and plantar surface, no skin lesions  Skin:    General: Skin is warm and dry.  Neurological:     Mental Status: He is alert and oriented to person, place, and time.     Comments: Alert Severe dysarthria Able to answer biographic questions.  He was able to follow simple motor commands.  Sensation diminished to light touch LLE Motor: RUE/RLE; 5/5 proximal to distal LUE: 4/5 proximal to distal LLE: 3-/5 proximal to distal  Psychiatric:        Mood and Affect: Mood normal.         Behavior: Behavior normal.    Assessment/Plan: 1. Functional deficits which require 3+ hours per day of interdisciplinary therapy in a comprehensive inpatient rehab setting. Physiatrist is providing close team supervision and 24 hour management of active medical problems listed below. Physiatrist and rehab team continue to assess barriers to discharge/monitor patient progress toward functional and medical goals  Care Tool:  Bathing    Body parts bathed by patient: Right arm, Left arm, Chest, Abdomen, Front perineal area, Right upper leg, Left upper leg, Face   Body parts bathed by helper: Buttocks, Right lower leg, Left lower leg     Bathing assist Assist Level: Moderate Assistance - Patient 50 - 74%     Upper Body Dressing/Undressing Upper body dressing   What is the patient wearing?: Hospital gown only    Upper body assist Assist Level: Minimal Assistance - Patient > 75%    Lower Body Dressing/Undressing Lower body dressing      What is the patient wearing?: Incontinence brief  Lower body assist Assist for lower body dressing: Maximal Assistance - Patient 25 - 49%     Toileting Toileting    Toileting assist Assist for toileting: Maximal Assistance - Patient 25 - 49%     Transfers Chair/bed transfer  Transfers assist  Chair/bed transfer activity did not occur: Refused  Chair/bed transfer assist level: 2 Helpers     Locomotion Ambulation   Ambulation assist      Assist level: Moderate Assistance - Patient 50 - 74% Assistive device: Walker-rolling Max distance: 25   Walk 10 feet activity   Assist     Assist level: Minimal Assistance - Patient > 75% Assistive device: Walker-rolling   Walk 50 feet activity   Assist Walk 50 feet with 2 turns activity did not occur: Safety/medical concerns         Walk 150 feet activity   Assist Walk 150 feet activity did not occur: Safety/medical concerns         Walk 10 feet on uneven surface   activity   Assist Walk 10 feet on uneven surfaces activity did not occur: Safety/medical concerns         Wheelchair     Assist Is the patient using a wheelchair?: No             Wheelchair 50 feet with 2 turns activity    Assist            Wheelchair 150 feet activity     Assist          Blood pressure (!) 154/85, pulse 63, temperature 98.9 F (37.2 C), temperature source Oral, resp. rate 16, height 6' (1.829 m), weight (!) 147.6 kg, SpO2 98 %.    Medical Problem List and Plan: 1.  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            Continue CIR PT, OT, SLP 2.  Impaired mobility: continue Lovenox. Dopplers reviewed and negative for clots.              -antiplatelet therapy: DAPT x1 month followed by Plavix alone. 3. Pain Management: Tylenol as needed.Thalamic pain , LLE start gabapentin 100mg  Qhs may need to titrate up 4. Mood: LCSW to follow for evaluation and support.             -antipsychotic agents: N/A 5. Neuropsych: This patient is not capable of making decisions on his own behalf. 6. Skin/Wound Care: Routine pressure-relief measures. 7. Fluids/Electrolytes/Nutrition: Will monitor intake/output.              CMP ordered for tomorrow AM             -- Will need assistance/encouragement for intake. 8.  HTN: Monitor blood pressures 3 times daily 5-7 days.              -was on Toprol XL 100mg  , Clonidine 0.1mg  BID PTA             Monitor with increased exertion  -increase amlodipine to 10mg    Schedule hydralazine 10mg  TID Vitals:   05/23/21 2132 05/24/21 0500  BP: (!) 177/89 (!) 154/85  Pulse: 64 63  Resp:  16  Temp: 97.6 F (36.4 C) 98.9 F (37.2 C)  SpO2: 100% 98%  Improving 10/25 Will start  metoprolol tartrate 12.5mg  BID 9.  T2DM with hyperglycemia: Hgb A1C- 6.6.  Was on metformin prior to admission which is being held due to CKD?             -- Monitor blood sugars achs and use SSI for elevated BS         CBG (last 3)  Recent Labs    05/23/21 1636 05/23/21 2213 05/24/21 0635  GLUCAP 125* 112* 113*   Controlled 10/25  10.  CKD stage IIIb: Serum creatinine 1.92 range per records., slightly above baseline              -- increase IVF to 20mls/hr for 8 hours given uptrending creatinine, repeat sl better  Change to .45 NS to reduce osmolarity  11.  Possible HCOM: Severe asymmetric LV septal hypertrophy per echo             -- Will need referral to cardiology at discharge 12. 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 Will need assistance with fluid intake.  --CXR shows cardiomegaly with slight central congestion 13. Dyslipidemia: LDL 139. On Crestor.  14. Morbid obesity: Educate on importance of diet/wt loss to promote health and mobility.   15.BPH?: Indicating difficulty with voiding--will resume Proscar and Flomax.  --toilet every 3-4 hours/assist with urinal 16. Assisted, witnessed fall: patient assessed and without any negative consequences of fall.  17. Bradycardia: monitor on BB on reduced dose compared to home dose   LOS: 4 days A FACE TO FACE EVALUATION WAS PERFORMED  Erick Colace 05/24/2021, 9:15 AM

## 2021-05-24 NOTE — Progress Notes (Signed)
Physical Therapy Session Note  Patient Details  Name: Brandon French MRN: [id number] Date of Birth: 12-Sep-1965  Today's Date: 05/24/2021 PT Individual Time: 0917-1003 and 1302-1401 PT Individual Time Calculation (min): 46 min and 59 min  Short Term Goals: Week 1:  PT Short Term Goal 1 (Week 1): Patient will complete be dmobility with CGA consistently PT Short Term Goal 2 (Week 1): Patient will transfer with LRAD bed <> wc with CGA consistently PT Short Term Goal 3 (Week 1): Patient will ambulate >50 with LRAD with CGA consistently  Skilled Therapeutic Interventions/Progress Updates:  Session 1:  Patient supine in bed on entrance to room. MD present at start of session for morning rounding. Patient alert and agreeable to PT session.   Patient relates pain in L foot to MD this morning, but no pain complaint throughout session.   Therapeutic Activity: Bed Mobility: Patient performed supine --> sit with supervision and use of bedrail. No vc required for technique this session. Able to doff gown in sitting and assisted with donning new gown requiring MinA for tying gown in back. Min/ ModA for threading LE into pants and pt able to pull up pants with CGA upon standing. MaxA for donning grip socks. Pt performs oral care using suction toothbrush with supervision and int CGA for use of suction. Washes hands and face while seated at sink with Mod I/ supervision.  Transfers: Patient performed sit<>stand transfers with MinA for anterior weight shifting and attempt to decrease knee extensor push into seat for compensatory movement forward to attain balance. Educated pt in differences in seats and inability to push against some seats in order to assist shifting body weight forward. Will need to use BLE strength to complete rise. Intermittent cueing required for BLE placement within walker for proper positioning upon standing. Once standing, pivot stepping is performed with CGA. Provided verbal cues for  technique, weight shifting.  Gait Training:  Patient ambulated 220 ft using RW with CGA. Demonstrated adequate foot clearance bilaterally with int need to correct direction to L to avoid obstacles to R side. Provided vc/ tc for visual scanning, level gaze, upright posture, decreasing lean into walker.  Patient seated upright  in w/c at end of session with brakes locked, belt alarm set, and all needs within reach.  Session 2: Patient seated upright in w/c on entrance to room. Patient alert and agreeable to PT session. MinA for donning slippers over grip socks prior to mobility this session.   Patient with no pain complaint throughout session.  Therapeutic Activity: Transfers: Patient performed sit<>stand and stand pivot transfers throughout session with light MinA/ CGA dependent on amount of fwd lean produced by pt. Provided vc/tc for increasing weight shift forward over feet.  Gait Training:  Patient ambulated 35 ft using RW for approach to steps with light CGA and is able to initiate steps using RHR only to ascend. Completes four 6" steps with CGA. Completes turn with CGA and descends using BHR with CGA.  Demonstrated good step clearance and strong concentric/ eccentric control. Provided vc/ tc for technique with foot clearance intermittently on descent.   Neuromuscular Re-ed: NMR facilitated during session with focus on standing balance/ tolerance, following instructions, verbal enunciation/ motor for speech production. Pt guided in use of BITS for task where pt provided with sequence of 4 words. Pt asked to read 4 words allowed. Then pt asked to state and find scattered shapes of words on screen on order to word sequence. Pt requires 20-25% assist  with sequence and makes good attempt with stating words aloud when reading and then relating shape. Stands for three bouts of x2 and 1 bout of . NMR performed for improvements in motor control and coordination, balance, sequencing, judgement,  and self confidence/ efficacy in performing all aspects of mobility at highest level of independence.   Patient seated upright in w/c at end of session with brakes locked, belt alarm set, and all needs within reach.     Therapy Documentation Precautions:  Precautions Precautions: Fall Restrictions Weight Bearing Restrictions: No General:   Pain:  Pain in R foot to MD in am without numerical rating. No indication of pain throughout sessions.   Therapy/Group: Individual Therapy  Loel Dubonnet PT, DPT 05/24/2021, 12:54 PM

## 2021-05-25 LAB — GLUCOSE, CAPILLARY
Glucose-Capillary: 113 mg/dL — ABNORMAL HIGH (ref 70–99)
Glucose-Capillary: 94 mg/dL (ref 70–99)
Glucose-Capillary: 93 mg/dL (ref 70–99)
Glucose-Capillary: 151 mg/dL — ABNORMAL HIGH (ref 70–99)

## 2021-05-25 MED ORDER — SODIUM CHLORIDE 0.45 % IV SOLN: INTRAVENOUS | Status: DC

## 2021-05-25 MED ORDER — CLONIDINE HCL 0.1 MG PO TABS
0.1000 mg | ORAL_TABLET | Freq: Two times a day (BID) | ORAL | Status: DC
  Administered 2021-05-25 – 2021-05-31 (×13): 0.1 mg via ORAL
  Filled 2021-05-25 (×13): qty 1

## 2021-05-25 NOTE — Progress Notes (Signed)
Physical Therapy Session Note  Patient Details  Name: Brandon French MRN: [id number] Date of Birth: 10/16/1965  Today's Date: 05/25/2021 PT Individual Time: 0905-1000 PT Individual Time Calculation (min): 55 min   Short Term Goals: Week 1:  PT Short Term Goal 1 (Week 1): Patient will complete be dmobility with CGA consistently PT Short Term Goal 2 (Week 1): Patient will transfer with LRAD bed <> wc with CGA consistently PT Short Term Goal 3 (Week 1): Patient will ambulate >50 with LRAD with CGA consistently  Skilled Therapeutic Interventions/Progress Updates:    Pt received sitting in w/c and agreeable to therapy session.  Transported to/from gym in w/c for time management and energy conservation. Sit>stand w/c>RW with min assist for lifting/balance to come to stand - pt initially impulsive requiring cuing for therapist to set-up w/c and AD. Gait training ~189ft using RW with light min assist progressing to CGA for steadying - demos decreased L LE foot clearance and step length with excessive L hip external rotation - slightly increased hip flexion posturing with forward lean. Pt noted to have soiled front of pants with urine therefore transported back to room in w/c.  Sit<>stands at sink with light min assist for balance while lifting/lowering and performed LB clothing management with assist to pull off lower legs. Standing at sink with CGA/close supervision during hygiene requiring set-up assist for anterior and total assist for posterior peri-care. During this task pt started voiding bladder again, unknowingly - provided pt ability to finish voiding continently. Donned clean LB clothing with max assist for time management. Nursing staff notified of pt's need for timed toileting.  Transported back to gym. Stair navigation ascending/descending 8 (6"height) steps using B HRs with step-to pattern leading with L LE on ascent and R LE on descent with no instability noted and pt with good sequencing  without cuing.  Pt reports using SPC prior to arrival. Gait training ~123ft using SPC in R UE with heavier min assist for balance compared to with RW with pt reporting increased instability; however, pt with improved upright posture compared to with RW. Educated pt likely will recommend using RW upon D/C; however, will continue gait training with LRAD.   Dynamic gait training using RW with task of weaving through cones working on AD management - pt with good ability to turn and maintain AD within BOS; however, has further decreased L LE foot clearance and step length occasionally allowing it to step outside of the AD - cuing for correction throughout.   Transported back to room and pt agreeable to remain sitting in w/c - left with needs in reach and seat belt alarm on.  Therapy Documentation Precautions:  Precautions Precautions: Fall Restrictions Weight Bearing Restrictions: No   Pain: No reports of pain throughout session.   Therapy/Group: Individual Therapy  Ginny Forth , PT, DPT, NCS, CSRS  05/25/2021, 7:54 AM

## 2021-05-25 NOTE — Progress Notes (Signed)
Occupational Therapy Session Note  Patient Details  Name: Brandon French MRN: [id number] Date of Birth: Sep 19, 1965  Today's Date: 05/25/2021 OT Individual Time: 9628-3662 OT Individual Time Calculation (min): 43 min    Short Term Goals: Week 1:  OT Short Term Goal 1 (Week 1): Pt will transfer to toilet wiht CGA OT Short Term Goal 2 (Week 1): Pt will don shirt wiht S OT Short Term Goal 3 (Week 1): Pt will stand to groom to demo improved activity tolernace OT Short Term Goal 4 (Week 1): Pt will thread BLE into pants wiht AE PRN  Skilled Therapeutic Interventions/Progress Updates:    Pt up in wheelchair to start session.  He was taken down to the ortho gym for session where he worked on LUE neuromuscular re-education with use of the UE ergonometer.  He was able to complete 1 set of 5 mins using BUEs on Random Program Setting with intensity set on level 10 and  RPMs maintained above 25.  He then completed 3 sets of 2 mins each using the isolated LUE only with resistance reduced to level 6.  Min facilitation needed to complete revolutions peddling backwards secondary to shoulder weakness.  He needed to re-grip several times as it would fatigue quickly.  Rest breaks of 1-2 mins taken between each set.  Increased fatigue reported in the left shoulder post exercise.  Finished session with transfer back to the room and pt sitting up in the wheelchair with the call button and phone in reach and safety belt in place.              Therapy Documentation Precautions:  Precautions Precautions: Fall Restrictions Weight Bearing Restrictions: No  Pain: Pain Assessment Pain Scale: Faces Pain Score: 0-No pain ADL: See Care Tool Section for some details of mobility and selfcare   Therapy/Group: Individual Therapy  Birda Didonato OTR/L 05/25/2021, 3:38 PM

## 2021-05-25 NOTE — Patient Care Conference (Signed)
Inpatient RehabilitationTeam Conference and Plan of Care Update Date: 05/25/2021   Time: 10:07 AM    Patient Name: Brandon French      Medical Record Number: [id number]  Date of Birth: [birth year] Sex: Male         Room/Bed: 4W07C/4W07C-01 Payor Info: Payor: [employer] MEDICARE / Plan: [employer] MEDICARE HMO / Product Type: *No Product type* /    Admit Date/Time:  05/20/2021  3:03 PM  Primary Diagnosis:  CVA (cerebral vascular accident) [facility])  Hospital Problems: Principal Problem:   CVA (cerebral vascular accident) [facility])    Expected Discharge Date: Expected Discharge Date: 06/03/21  Team Members Present: Physician leading conference: Dr. Claudette Laws Social Worker Present: Lavera Guise, BSW Nurse Present: Chana Bode, RN PT Present: Casimiro Needle, PT OT Present: Annye English, OT SLP Present: Eilene Ghazi, SLP PPS Coordinator present : Edson Snowball, PT     Current Status/Progress Goal Weekly Team Focus  Bowel/Bladder   patient is incontinent of bowel and bladder with LBM 05/24/24  maintain regular bowel movements      Swallow/Nutrition/ Hydration   Dys 1, honey thick liquids min-to-modA for swallow safety  sup A  Tolerance of dys 1 and honey thick liquids with safe swallow strategies   ADL's   S for seated oral care/grooming, min A stand-pivot bathroom transfers with RW; UB dressing and bathing, max toileting tasks and LB dressing; poor motor planning at times with transfers  S ADL and transfers  LUE NMR, balance/self-care/transfer retraining, pt/family education, DME/AE use   Mobility   severe dysarthria; Bed mobility = supervision/ Mod I; sit<> stand = MinA using RW, stand pivot = CGA using RW; ambulation =  up to 140ft using RW with light min assist, 4 stairs using B HRs with light min assist  All functional mobility at overall supervision level; ambulation with RW  incorporating increased verbal articulation into sessions, L NMR, standing balance, improved LOA  with transfers, insight into compensations he's learned from previous stroke for mobility, safety awareness - will need ~2wks   Communication   max A  min A  communication using multimodal communication, speech intelligiblity strategies, diaphragnatic breathing   Safety/Cognition/ Behavioral Observations            Pain   patient with no complaints of pain  continue pain mgmt as needed      Skin   skin intact  maintain skin integrity        Discharge Planning:  Patient plans to discharge home with parents (in late 80s), able to provide supervision   Team Discussion: Left LE weakness from previous CVA with dysarthria , poor motor planning and balance issues from new left subcortical infarct. HS IVF due to decreased oral intake with D1 Honey diet. Needs cues not to talk when eating to avoid coughing.  Patient on target to meet rehab goals: yes, currently CGA for transfers with light max assist for ambulation. Working on Merchant navy officer use for transfers and lower body dressing. Able to bathe at the shower level but needs max assist for toileting. Goals for discharge set for supervision level.  *See Care Plan and progress notes for long and short-term goals.   Revisions to Treatment Plan:  Working on  Chartered loss adjuster with steps   Teaching Needs: Safety, medications, nutrition, secondary risk management, transfers, etc  Current Barriers to Discharge: Decreased caregiver support, Home enviroment access/layout, and Nutritional means  Possible Resolutions to Barriers: Family education with parents  F/U [facility] services DME; bari walker     Medical Summary Current Status: Blood pressure elevation difficult to control on multiple medications, diabetic control improving, urinary incontinence  Barriers to Discharge: Medical stability   Possible Resolutions to Barriers/Weekly Focus: Blood pressure medication adjustment, toileting program at this point  hold off on anticholinergic medications   Continued Need for Acute Rehabilitation Level of Care: The patient requires daily medical management by a physician with specialized training in physical medicine and rehabilitation for the following reasons: Direction of a multidisciplinary physical rehabilitation program to maximize functional independence : Yes Medical management of patient stability for increased activity during participation in an intensive rehabilitation regime.: Yes Analysis of laboratory values and/or radiology reports with any subsequent need for medication adjustment and/or medical intervention. : Yes   I attest that I was present, lead the team conference, and concur with the assessment and plan of the team.   Chana Bode B 05/25/2021, 3:34 PM

## 2021-05-25 NOTE — Progress Notes (Signed)
Occupational Therapy Session Note  Patient Details  Name: Brandon French MRN: [id number] Date of Birth: [birth year]  Today's Date: 05/25/2021 OT Individual Time: 0700-0808 OT Individual Time Calculation (min): 68 min    Short Term Goals: Week 1:  OT Short Term Goal 1 (Week 1): Pt will transfer to toilet wiht CGA OT Short Term Goal 2 (Week 1): Pt will don shirt wiht S OT Short Term Goal 3 (Week 1): Pt will stand to groom to demo improved activity tolernace OT Short Term Goal 4 (Week 1): Pt will thread BLE into pants wiht AE PRN  Skilled Therapeutic Interventions/Progress Updates:    Pt received asleep in bed, easily awakened to tactile cues, agreeable to therapy. Session focus on self-care retraining, activity tolerance, functional transfers in prep for improved ADL/IADL/func mobility performance + decreased caregiver burden. BP in supine read at 156/ 67 (108). Came to sitting EOB with CGA and use of bed features. Total A to don/doff gripper socks. Sit to stand from elevated bed surface and two attempts for motor planning, ambulated to shower with CGA and cues for safe RW management. Declined need to toilet but brief saturated with urine, pt with poor awareness. Bathed full-body with min A to bathe buttocks/B feet. Donned brief with max A to thread BLE and to adjust straps. S and increased time for UB dressing seated at sink. Mod A to thread BLE into pants, pt able to pull over hips himself in standing. Completed oral care with suction sponge x2 while seated throughout session. Cues to intermittently use suction for oral secretion management.   Self-fed breakfast with S for swallowing strategies per SLP sheet. Several instances of coughing, cues to take smaller sips per spoon and to alternate liquids/solids per sheet.  BP post activity read at  171/97 (121). LPN notified and pt denies any sx.  Pt left seated in w/c with safety belt alarm engaged, call bell in reach, and all immediate needs met.     Therapy Documentation Precautions:  Precautions Precautions: Fall Restrictions Weight Bearing Restrictions: No  Pain: denies   ADL: See Care Tool for more details.   Therapy/Group: Individual Therapy  Volanda Napoleon MS, OTR/L  05/25/2021, 6:37 AM

## 2021-05-25 NOTE — Progress Notes (Signed)
Speech Language Pathology Daily Session Note  Patient Details  Name: Brandon French MRN: [id number] Date of Birth: Mar 03, 1966  Today's Date: 05/25/2021 SLP Individual Time: 8115-7262 SLP Individual Time Calculation (min): 45 min  Short Term Goals: Week 1: SLP Short Term Goal 1 (Week 1): Pt will consume presentations of honey thick liquids via teaspoon and dys 1 textures with supervision cues for use of swallowing precautions. SLP Short Term Goal 2 (Week 1): Pt will consume therapeutic trials of honey thick liquids via cup sips with min cues for use of swallowing precautions and minimal overt s/s of aspiration. SLP Short Term Goal 3 (Week 1): Pt will utilize overarticulation and loud voice to achieve intelligibilty at the word level in ~50% of opportunities during structured tasks. SLP Short Term Goal 4 (Week 1): Pt will utilize multimodal communication during communication breakdowns with Min verbal cues.  Skilled Therapeutic Interventions: Pt received upright in wheelchair and agreeable to skilled ST intervention with focus on speech and swallowing goals. SLP facilitated session by providing mod-to-max A for speech intelligibility with functional phrases (e.g., "my my brother is Mac", "I love you") to achieve 50% intelligibility. Pt with significantly reduced/uncoordinated breath support for speech with consistent cues for diaphragmatic breath prior to verbalization. Pt demonstrated good self monitoring and correction of speech errors when he required additional breath support for improved intelligibility and vocal intensity. SLP facilitated training on inhalation and sustained phonation /ah/ with mod A for coordinating breath. Patient consumed HT liquids by spoon without overt s/sx of aspiration. Recommend continuation of full supervision with meals but allow pt to consume liquids at bedside with intermittent check ins. Patient was left in wheelchair with alarm activated and immediate needs within  reach at end of session. Continue per current plan of care.      Pain Pain Assessment Pain Scale: Faces Pain Score: 0-No pain  Therapy/Group: Individual Therapy  Patty Sermons 05/25/2021, 3:43 PM

## 2021-05-25 NOTE — Progress Notes (Signed)
PROGRESS NOTE   Subjective/Complaints:  Incontinent of urine during physical therapy.  Patient states he had little warning he denies any pain with urination.  No fever  ROS:No CP, SOB, N/V/D   Objective:   No results found. Recent Labs    05/23/21 0524  WBC 5.9  HGB 11.7*  HCT 38.1*  PLT 306    Recent Labs    05/23/21 0524  NA 140  K 3.5  CL 107  CO2 25  GLUCOSE 98  BUN 23*  CREATININE 2.19*  CALCIUM 8.3*     Intake/Output Summary (Last 24 hours) at 05/25/2021 0900 Last data filed at 05/24/2021 1812 Gross per 24 hour  Intake 354 ml  Output --  Net 354 ml         Physical Exam: Vital Signs Blood pressure (!) 159/74, pulse (!) 53, temperature 98.2 F (36.8 C), temperature source Oral, resp. rate 19, height 6' (1.829 m), weight (!) 147.6 kg, SpO2 100 %.    General: No acute distress Mood and affect are appropriate Heart: Regular rate and rhythm no rubs murmurs or extra sounds Lungs: Clear to auscultation, breathing unlabored, no rales or wheezes Abdomen: Positive bowel sounds, soft nontender to palpation, nondistended Extremities: No clubbing, cyanosis, or edema Skin: No evidence of breakdown, no evidence of rash  Musculoskeletal:     Cervical back: Normal range of motion and neck supple.     Comments: No edema in extremities No pain to palpation Left mid foot and plantar surface, no skin lesions   Neurological:     Mental Status: He is alert and oriented to person, place, and time.     Comments: Alert Severe dysarthria Able to answer biographic questions.  He was able to follow simple motor commands.  Sensation diminished to light touch LLE Motor: RUE/RLE; 5/5 proximal to distal LUE: 4/5 proximal to distal LLE: 3-/5 proximal to distal  Psychiatric:        Mood and Affect: Mood normal.        Behavior: Behavior normal.    Assessment/Plan: 1. Functional deficits which require 3+  hours per day of interdisciplinary therapy in a comprehensive inpatient rehab setting. Physiatrist is providing close team supervision and 24 hour management of active medical problems listed below. Physiatrist and rehab team continue to assess barriers to discharge/monitor patient progress toward functional and medical goals  Care Tool:  Bathing    Body parts bathed by patient: Right arm, Left arm, Chest, Abdomen, Front perineal area, Right upper leg, Left upper leg, Face   Body parts bathed by helper: Buttocks, Right lower leg, Left lower leg     Bathing assist Assist Level: Moderate Assistance - Patient 50 - 74%     Upper Body Dressing/Undressing Upper body dressing   What is the patient wearing?: Pull over shirt    Upper body assist Assist Level: Supervision/Verbal cueing    Lower Body Dressing/Undressing Lower body dressing      What is the patient wearing?: Incontinence brief     Lower body assist Assist for lower body dressing: Maximal Assistance - Patient 25 - 49%     Toileting Toileting    Toileting assist  Assist for toileting: Maximal Assistance - Patient 25 - 49%     Transfers Chair/bed transfer  Transfers assist  Chair/bed transfer activity did not occur: Refused  Chair/bed transfer assist level: Minimal Assistance - Patient > 75%     Locomotion Ambulation   Ambulation assist      Assist level: Moderate Assistance - Patient 50 - 74% Assistive device: Walker-rolling Max distance: 25   Walk 10 feet activity   Assist     Assist level: Minimal Assistance - Patient > 75% Assistive device: Walker-rolling   Walk 50 feet activity   Assist Walk 50 feet with 2 turns activity did not occur: Safety/medical concerns         Walk 150 feet activity   Assist Walk 150 feet activity did not occur: Safety/medical concerns         Walk 10 feet on uneven surface  activity   Assist Walk 10 feet on uneven surfaces activity did not occur:  Safety/medical concerns         Wheelchair     Assist Is the patient using a wheelchair?: No             Wheelchair 50 feet with 2 turns activity    Assist            Wheelchair 150 feet activity     Assist          Blood pressure (!) 159/74, pulse (!) 53, temperature 98.2 F (36.8 C), temperature source Oral, resp. rate 19, height 6' (1.829 m), weight (!) 147.6 kg, SpO2 100 %.    Medical Problem List and Plan: 1.  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            Continue CIR PT, OT, SLP Team conference today please see physician documentation under team conference tab, met with team  to discuss problems,progress, and goals. Formulized individual treatment plan based on medical history, underlying problem and comorbidities.  2.  Impaired mobility: continue Lovenox. Dopplers reviewed and negative for clots.              -antiplatelet therapy: DAPT x1 month followed by Plavix alone. 3. Pain Management: Tylenol as needed.Thalamic pain , LLE start gabapentin 169m Qhs may need to titrate up 4. Mood: LCSW to follow for evaluation and support.             -antipsychotic agents: N/A 5. Neuropsych: This patient is not capable of making decisions on his own behalf. 6. Skin/Wound Care: Routine pressure-relief measures. 7. Fluids/Electrolytes/Nutrition: Will monitor intake/output.              CMP ordered for tomorrow AM             -- Will need assistance/encouragement for intake. 8.  HTN: Monitor blood pressures 3 times daily 5-7 days.              -was on Toprol XL 1050m, Clonidine 0.44m43mID PTA, will resume clonidine             Monitor with increased exertion  -increase amlodipine to 21m49mDiscontinue hydralazine 21mg19mD, patient back on clonidine today Vitals:  05/24/21 1935 05/25/21 0327  BP: (!) 161/84 (!) 159/74  Pulse: 64 (!) 53  Resp: 18 19  Temp: 98.2 F (36.8 C) 98.2 F (36.8 C)  SpO2: 96% 100%  Still elevated' 10/26 Continue metoprolol tartrate 12.31m BID,       will need to monitor for bradycardia. 9.  T2DM with hyperglycemia: Hgb A1C- 6.6.  Was on metformin prior to admission which is being held due to CKD?             -- Monitor blood sugars achs and use SSI for elevated BS         CBG (last 3)  Recent Labs    05/24/21 1657 05/24/21 2135 05/25/21 0627  GLUCAP 109* 167* 113*   Controlled 10/26  10.  CKD stage IIIb: Serum creatinine 1.92 range per records., slightly above baseline              -- increase IVF to 769m/hr for 8 hours given uptrending creatinine, repeat sl better  Change to .45 NS to reduce osmolarity  11.  Possible HCOM: Severe asymmetric LV septal hypertrophy per echo             -- Will need referral to cardiology at discharge 12. 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 Will need assistance with fluid intake.  --CXR shows cardiomegaly with slight central congestion 13. Dyslipidemia: LDL 139. On Crestor.  14. Morbid obesity: Educate on importance of diet/wt loss to promote health and mobility.   15.BPH?: Indicating difficulty with voiding--will resume Proscar and Flomax.  --toilet every 3-4 hours/assist with urinal 16. Assisted, witnessed fall: patient assessed and without any negative consequences of fall.  17. Bradycardia: monitor on BB on reduced dose compared to home dose we will place parameters  LOS: 5 days A FACE TO FACE EVALUATION WAS PERFORMED  AnCharlett Blake0/26/2022, 9:00 AM

## 2021-05-25 NOTE — Progress Notes (Signed)
Patient ID: Brandon French, male   DOB: 1965-12-20, 55 y.o.   MRN: [id number] Team Conference Report to Patient/Family  Team Conference discussion was reviewed with the patient and caregiver, including goals, any changes in plan of care and target discharge date.  Patient and caregiver express understanding and are in agreement.  The patient has a target discharge date of 06/03/21.  Sw met with patient at bedside. Pt called mother. Sw provided team conference updates. Pt mother will arrange to bring patient an additional pair of shoes and  phone charger. No additional questions or concerns.  Dyanne Iha 05/25/2021, 2:07 PM

## 2021-05-26 LAB — GLUCOSE, CAPILLARY
Glucose-Capillary: 87 mg/dL (ref 70–99)
Glucose-Capillary: 130 mg/dL — ABNORMAL HIGH (ref 70–99)
Glucose-Capillary: 101 mg/dL — ABNORMAL HIGH (ref 70–99)
Glucose-Capillary: 147 mg/dL — ABNORMAL HIGH (ref 70–99)

## 2021-05-26 LAB — BASIC METABOLIC PANEL
Anion gap: 5 (ref 5–15)
BUN: 31 mg/dL — ABNORMAL HIGH (ref 6–20)
CO2: 28 mmol/L (ref 22–32)
Calcium: 8.5 mg/dL — ABNORMAL LOW (ref 8.9–10.3)
Chloride: 107 mmol/L (ref 98–111)
Creatinine, Ser: 2.38 mg/dL — ABNORMAL HIGH (ref 0.61–1.24)
GFR, Estimated: 32 mL/min — ABNORMAL LOW (ref >60)
Glucose, Bld: 103 mg/dL — ABNORMAL HIGH (ref 70–99)
Potassium: 3.6 mmol/L (ref 3.5–5.1)
Sodium: 140 mmol/L (ref 135–145)

## 2021-05-26 MED ORDER — SODIUM CHLORIDE 0.45 % IV SOLN: Freq: Every day | INTRAVENOUS | Status: DC

## 2021-05-26 MED ORDER — ENSURE ENLIVE PO LIQD
237.0000 mL | Freq: Two times a day (BID) | ORAL | Status: DC
  Administered 2021-05-26 – 2021-06-06 (×21): 237 mL via ORAL

## 2021-05-26 NOTE — Progress Notes (Signed)
Nutrition Follow-up  DOCUMENTATION CODES:   Morbid obesity  INTERVENTION:  - Discontinue 30 ml ProSource Plus BID, each supplement provides 100 kcals and 15 grams protein.   - Ensure Enlive po BID (thickened to appropriate consistency), each supplement provides 350 kcal and 20 grams of protein  - Continue Magic cup TID with meals, each supplement provides 290 kcal and 9 grams of protein  NUTRITION DIAGNOSIS:   Increased nutrient needs related to  (therapy) as evidenced by estimated needs.  On-going  GOAL:   Patient will meet greater than or equal to 90% of their needs  Progressing, meeting needs through PO intake and nutrition supplements  MONITOR:   PO intake, Supplement acceptance, Skin, Weight trends, Labs, I & O's  REASON FOR ASSESSMENT:   Consult Assessment of nutrition requirement/status  ASSESSMENT:   55 year old male with history of DM type 2, HTN, HLD and previous CVA. Presented on 05/16/2021 with weakness and slurred speech. 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  Pt experiencing difficulty with speech. Continues on Dysphagia 1, honey thick diet. Sporadic intakes documented. PTA he was eating 2-3 meals per day. He denies any difficulty with chewing and trouble swallowing. Pt does not like Prosource but agreed to receiving thickened Ensure. Meal Completion:  10/24: Breakfast 90%, Lunch 20%, Dinner 70% 10/25: 90% x 2 recorded meals 10/26: 60-75% x 3 recorded meals  He reports having a small amount of weight loss PTA however unable to obtain weight history from pt. Limited documentation of weight history in chart. Noted some mild fat depletions with physical exam. Will continue to monitor trends.  Medications: SSI, protonix  Labs: BUN 31, Cr 2.38, CBGs 87-151  x24 hours   NUTRITION - FOCUSED PHYSICAL EXAM:  Flowsheet Row Most Recent Value  Orbital Region Mild depletion  Upper Arm Region Mild depletion  Thoracic and Lumbar Region No depletion  Buccal Region No depletion  Temple Region Mild depletion  Clavicle Bone Region No depletion  Clavicle and Acromion Bone Region No depletion  Scapular Bone Region No depletion  Dorsal Hand No depletion  Patellar Region No depletion  Anterior Thigh Region No depletion  Posterior Calf Region No depletion  Edema (RD Assessment) Mild  Hair Reviewed  Eyes Reviewed  Mouth Other (Comment)  [yellow coating]  Skin Reviewed  Nails Reviewed       Diet Order:   Diet Order             DIET - DYS 1 Room service appropriate? Yes; Fluid consistency: Honey Thick  Diet effective now                   EDUCATION NEEDS:   Not appropriate for education at this time  Skin:  Skin Assessment: Reviewed RN Assessment  Last BM:  05/25/21  Height:   Ht Readings from Last 1 Encounters:  05/20/21 6' (1.829 m)    Weight:   Wt Readings from Last 1 Encounters:  05/20/21 (!) 147.6 kg    BMI:  Body mass index is 44.13 kg/m.  Estimated Nutritional Needs:   Kcal:  2100-2300  Protein:  105-115 grams  Fluid:  >/= 2 L/day  Drusilla Kanner, RDN, LDN Clinical Nutrition

## 2021-05-26 NOTE — Progress Notes (Signed)
Occupational Therapy Session Note  Patient Details  Name: Brandon French MRN: [id number] Date of Birth: 14-Apr-1966  Today's Date: 05/26/2021 OT Individual Time: 1002-1110 OT Individual Time Calculation (min): 68 min    Short Term Goals: Week 1:  OT Short Term Goal 1 (Week 1): Pt will transfer to toilet wiht CGA OT Short Term Goal 2 (Week 1): Pt will don shirt wiht S OT Short Term Goal 3 (Week 1): Pt will stand to groom to demo improved activity tolernace OT Short Term Goal 4 (Week 1): Pt will thread BLE into pants wiht AE PRN  Skilled Therapeutic Interventions/Progress Updates:    Pt received semi-reclined in bed with NT present, denies pain, agreeable to therapy. Session focus on self-care retraining, activity tolerance, AE education, cardiovascular endurance, functional transfers in prep for improved ADL/IADL/func mobility performance + decreased caregiver burden. Came to sitting EOB with S and use of bed rail. Donned pants with mod A to thread BLE. Unable to achieve figure 4 position or prop BLE on EOB to be able to reach feet. Sit to stand with CGA and RW to pull up pants. Total A to don B socks due to inability to reach feet.   RN present to disconnect IV and pt donned shirt with set-up A. Ambulatory bathroom transfer with RW and CGA. Able to pull pants/brief down with CGA, no void but noted to be incontinent of bladder/smear of BM in brief. Total A to change out brief and min A to pull pants back up. Total A for posterior pericare.   Self-propelled w/c to nurses station with BUE and min to mod A to prevent L veering. Cues to move BUE at the same time for efficient strokes.   Completed 2 bouts on Nustep to target cardiovascular endurance and BUE/BLE strengthening.   -Completed 5 min at level 7/10 resistance with average step length over 50 steps/min; rated activity at 15/20 on RPE. HR in upper 80s post activity, recovered to 64 bpm.   -Completed 5 min 30 secs post 2 min rest break at  same level of resistance.   Demonstrated use of reacher to thread BLE into pants and use of sock aid. Pt able to return demonstration of sock aid. Will cont to trial AE to facilitate LBD. Pt verbalized understanding of purpose of DME and is interested in trying during next morning routine.  Pt left seated in w/c with safety belt alarm placed, call bell in reach, and all immediate needs met.    Therapy Documentation Precautions:  Precautions Precautions: Fall Restrictions Weight Bearing Restrictions: No  Pain: denies   ADL: See Care Tool for more details.  Therapy/Group: Individual Therapy  Volanda Napoleon MS, OTR/L  05/26/2021, 6:44 AM

## 2021-05-26 NOTE — Progress Notes (Signed)
PROGRESS NOTE   Subjective/Complaints:  No issues overnight, working with speech therapy, states that he did not have any speech difficulties or swallowing difficulties prior to the most recent stroke.  He does have a history of chronic left hemiparesis affecting the lower limb greater than the upper limb. No pain complaints today, does have some constipation but feels he may be able to go today.  ROS:No CP, SOB, N/V/D   Objective:   No results found. No results for input(s): WBC, HGB, HCT, PLT in the last 72 hours.  Recent Labs    05/26/21 0505  NA 140  K 3.6  CL 107  CO2 28  GLUCOSE 103*  BUN 31*  CREATININE 2.38*  CALCIUM 8.5*     Intake/Output Summary (Last 24 hours) at 05/26/2021 0831 Last data filed at 05/26/2021 0543 Gross per 24 hour  Intake 481 ml  Output 500 ml  Net -19 ml         Physical Exam: Vital Signs Blood pressure (!) 157/96, pulse 66, temperature 97.6 F (36.4 C), resp. rate 18, height 6' (1.829 m), weight (!) 147.6 kg, SpO2 100 %.    Musculoskeletal:     Cervical back: Normal range of motion and neck supple.     Comments: No edema in extremities No pain to palpation Left mid foot and plantar surface, no skin lesions   Neurological:     Mental Status: He is alert and oriented to person, place, and time.     Comments: Alert Severe dysarthria Able to answer biographic questions.  He was able to follow simple motor commands.  Sensation diminished to light touch LLE Motor: RUE/RLE; 5/5 proximal to distal LUE: 4/5 proximal to distal LLE: 3-/5 proximal to distal  Psychiatric:        Mood and Affect: Mood normal.        Behavior: Behavior normal.    Assessment/Plan: 1. Functional deficits which require 3+ hours per day of interdisciplinary therapy in a comprehensive inpatient rehab setting. Physiatrist is providing close team supervision and 24 hour management of active medical  problems listed below. Physiatrist and rehab team continue to assess barriers to discharge/monitor patient progress toward functional and medical goals  Care Tool:  Bathing    Body parts bathed by patient: Right arm, Left arm, Chest, Abdomen, Front perineal area, Right upper leg, Left upper leg, Face   Body parts bathed by helper: Buttocks, Right lower leg, Left lower leg     Bathing assist Assist Level: Moderate Assistance - Patient 50 - 74%     Upper Body Dressing/Undressing Upper body dressing   What is the patient wearing?: Pull over shirt    Upper body assist Assist Level: Supervision/Verbal cueing    Lower Body Dressing/Undressing Lower body dressing      What is the patient wearing?: Incontinence brief     Lower body assist Assist for lower body dressing: Maximal Assistance - Patient 25 - 49%     Toileting Toileting    Toileting assist Assist for toileting: Maximal Assistance - Patient 25 - 49%     Transfers Chair/bed transfer  Transfers assist  Chair/bed transfer activity did not occur:  Refused  Chair/bed transfer assist level: Minimal Assistance - Patient > 75% (stand pivot) Chair/bed transfer assistive device: Armrests, Geologist, engineering   Ambulation assist      Assist level: Minimal Assistance - Patient > 75% Assistive device: Walker-rolling Max distance: 160ft   Walk 10 feet activity   Assist     Assist level: Minimal Assistance - Patient > 75% Assistive device: Walker-rolling   Walk 50 feet activity   Assist Walk 50 feet with 2 turns activity did not occur: Safety/medical concerns         Walk 150 feet activity   Assist Walk 150 feet activity did not occur: Safety/medical concerns         Walk 10 feet on uneven surface  activity   Assist Walk 10 feet on uneven surfaces activity did not occur: Safety/medical concerns         Wheelchair     Assist Is the patient using a wheelchair?: No              Wheelchair 50 feet with 2 turns activity    Assist            Wheelchair 150 feet activity     Assist          Blood pressure (!) 157/96, pulse 66, temperature 97.6 F (36.4 C), resp. rate 18, height 6' (1.829 m), weight (!) 147.6 kg, SpO2 100 %.    Medical Problem List and Plan: 1.  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            Continue CIR PT, OT, SLP   2.  Impaired mobility: continue Lovenox. Dopplers reviewed and negative for clots.              -antiplatelet therapy: DAPT x1 month followed by Plavix alone. 3. Pain Management: Tylenol as needed.Thalamic pain , LLE start gabapentin 100mg  Qhs may need to titrate up 4. Mood: LCSW to follow for evaluation and support.             -antipsychotic agents: N/A 5. Neuropsych: This patient is not capable of making decisions on his own behalf. 6. Skin/Wound Care: Routine pressure-relief measures. 7. Fluids/Electrolytes/Nutrition: Will monitor intake/output.              CMP ordered for tomorrow AM             -- Will need assistance/encouragement for intake. 8.  HTN: Monitor blood pressures 3 times daily 5-7 days.              -was on Toprol XL 100mg  , Clonidine 0.1mg  BID PTA, will resume clonidine             Monitor with increased exertion  -increase amlodipine to 10mg    Discontinue hydralazine 10mg  TID, patient back on clonidine today Vitals:   05/25/21 1952 05/26/21 0420  BP: (!) 158/69 (!) 157/96  Pulse: (!) 56 66  Resp: 18 18  Temp: 98.1 F (36.7 C) 97.6 F (36.4 C)  SpO2: 99% 100%  Still elevated' 10/27 Continue metoprolol tartrate 12.5mg  BID,       will need to monitor for bradycardia.  9.  T2DM with hyperglycemia: Hgb A1C- 6.6.  Was on  metformin prior to admission which is being held due to CKD?             -- Monitor blood sugars achs and use SSI for elevated BS         CBG (last 3)  Recent Labs    05/25/21 1717 05/25/21 2113 05/26/21 0542  GLUCAP 93 151* 87   Controlled 10/27  10.  CKD stage IIIb: Serum creatinine 1.92 range per records., slightly above baseline              -- increase IVF to 27mls/hr for 12 hours given uptrending creatinine, repeat sl better  Change to .45 NS to reduce osmolarity , will need to monitor K+ as well 11.  Possible HCOM: Severe asymmetric LV septal hypertrophy per echo             -- Will need referral to cardiology at discharge 12. 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 Will need assistance with fluid intake.  Dysphagia and dysarthria related to recent left subcortical infarcts.  Repeat modified barium swallow once patient clinically able to handle nectar liquids with speech therapy.  Hope this can be done next week prior to discharge. 13. Dyslipidemia: LDL 139. On Crestor.  14. Morbid obesity: Educate on importance of diet/wt loss to promote health and mobility.   15.BPH?: Indicating difficulty with voiding--will resume Proscar and Flomax.  --toilet every 3-4 hours/assist with urinal 16. Assisted, witnessed fall: patient assessed and without any negative consequences of fall.  17. Bradycardia: monitor on BB on reduced dose compared to home dose we will place parameters   LOS: 6 days A FACE TO FACE EVALUATION WAS PERFORMED  Erick Colace 05/26/2021, 8:31 AM

## 2021-05-26 NOTE — Progress Notes (Signed)
Physical Therapy Session Note  Patient Details  Name: Brandon French MRN: [id number] Date of Birth: [birth year]  Today's Date: 05/26/2021 PT Individual Time: 1303-1400 PT Individual Time Calculation (min): 57 min   Short Term Goals: Week 1:  PT Short Term Goal 1 (Week 1): Patient will complete be dmobility with CGA consistently PT Short Term Goal 2 (Week 1): Patient will transfer with LRAD bed <> wc with CGA consistently PT Short Term Goal 3 (Week 1): Patient will ambulate >50 with LRAD with CGA consistently Week 2:    Week 3:     Skilled Therapeutic Interventions/Progress Updates:   Pt initially oob in wc. Sit to stand w/cues for wt shift, min to cga, noted saturated brief/pants. Pt doffs pants/brief w/max assist in sitting and standing.  Dons clean brief and pants w/mod to max assist in sitting/standing. Gait 142ft w/RW w/cga.   Functional gait around 8 cones rquiring turning to mimic household situation/navigation. Performs w/cga and RW.  Dual task challenge: Standing w/RW- alternating UE reach to progressively increased strands of numbers called by therapist.   2 -4digit strand 100 % accuracy 5 strand 80% 6 strand single error occurs 100% of trials.   Errors increase when RW removed and reaching performed unsupported.  Pt able to balance w/cga only under both conditions.  Sit to stand - worked on ant wt shift and stabilization in standing repeated x 5 W/RW -mechanics improve w/repeated efforts. Repeated x 5 no AD, cga  Pt engaged in conversation throughout session, encouraged to enunciate/repeat when necessary. Gait x 150w/RW w/close supervison, cues for safety w/turn to sit in wc. Pt left oob in wc w/alarm belt set and needs in reach    Therapy Documentation Precautions:  Precautions Precautions: Fall Restrictions Weight Bearing Restrictions: No     Therapy/Group: Individual Therapy Rada Hay, PT   Shearon Balo 05/26/2021, 3:57 PM

## 2021-05-26 NOTE — Progress Notes (Addendum)
Speech Language Pathology Daily Session Note  Patient Details  Name: Brandon French MRN: [id number] Date of Birth: March 04, 1966  Today's Date: 05/26/2021 SLP Individual Time: 0805-0900 SLP Individual Time Calculation (min): 55 min  Short Term Goals: Week 1: SLP Short Term Goal 1 (Week 1): Pt will consume presentations of honey thick liquids via teaspoon and dys 1 textures with supervision cues for use of swallowing precautions. SLP Short Term Goal 2 (Week 1): Pt will consume therapeutic trials of honey thick liquids via cup sips with min cues for use of swallowing precautions and minimal overt s/s of aspiration. SLP Short Term Goal 3 (Week 1): Pt will utilize overarticulation and loud voice to achieve intelligibilty at the word level in ~50% of opportunities during structured tasks. SLP Short Term Goal 4 (Week 1): Pt will utilize multimodal communication during communication breakdowns with Min verbal cues.  Skilled Therapeutic Interventions: Pt received upright in bed and agreeable to skilled ST intervention with focus on swallowing and speech goals. Pt consumed morning meal containing Dys 1 textures and HTL by tsp. Pt consumed meal with sup A verbal cues for swallowing precautions including monitoring bolus volume, rate of consumption, and ensuring oral clearance prior to next bite. Pt exhibited improved timeliness of AP transit although oral manipulation continues to appear effortful with Dys 1 textures. Pt exhibited throat clear x2 and no coughing events during meal. Unable to decipher cough response with Dys 1 vs. HT liquids. Pt accepted therapeutic trials of HT liquids via cup sips with sup A verbal cues for small bolus volume. Pt consumed without overt s/sx of aspiration. Difficult to analyze vocal quality due to vocal strain and mildly wet vocal quality at baseline without presentation of PO intake. Pt consumed nectar thick liquids by cup x2 with immediate cough response during 2nd trial. Cough  considered weak and strained. Continue Dys 1 diet and HT liquids by tsp. Allow pt to continue HT liquids at bedside by tsp ONLY. Pt verbalized understanding and demonstrated use of spoon independent of cues/reminders during session. Anticipate repeat MBS prior to discharge. SLP also facilitated session by providing mod-to-max A verbal cues for speech intelligibility strategies and increased breath support to produce functional phrases with 50% intelligibility. Pt continues to exhibit significant vocal strain and difficulty coordinating respirations with speech production. He displayed good self monitoring and was able to self correct speech with min A verbal cues. Patient was left in bed with alarm activated and immediate needs within reach at end of session. Continue per current plan of care.      Pain Pain Assessment Pain Scale: 0-10 Pain Score: 0-No pain  Therapy/Group: Individual Therapy  Tamala Ser 05/26/2021, 12:42 PM

## 2021-05-27 LAB — GLUCOSE, CAPILLARY
Glucose-Capillary: 102 mg/dL — ABNORMAL HIGH (ref 70–99)
Glucose-Capillary: 141 mg/dL — ABNORMAL HIGH (ref 70–99)
Glucose-Capillary: 114 mg/dL — ABNORMAL HIGH (ref 70–99)
Glucose-Capillary: 149 mg/dL — ABNORMAL HIGH (ref 70–99)

## 2021-05-27 NOTE — Progress Notes (Signed)
Physical Therapy Session Note  Patient Details  Name: Brandon French MRN: [id number] Date of Birth: January 09, 1966  Today's Date: 05/27/2021 PT Individual Time: 1715-1738 PT Individual Time Calculation (min): 23 min   Short Term Goals: Week 1:  PT Short Term Goal 1 (Week 1): Patient will complete be dmobility with CGA consistently PT Short Term Goal 2 (Week 1): Patient will transfer with LRAD bed <> wc with CGA consistently PT Short Term Goal 3 (Week 1): Patient will ambulate >50 with LRAD with CGA consistently   Skilled Therapeutic Interventions/Progress Updates:   Pt received sitting in WC and agreeable to PT  Sit<>stand with min assist and UE supported on RW throughout session. Gait training with RW 2 x 14f with min-CGA with cues for attention to obstacles on the R and safe management of AD with correcting from hitting obstacles.   Dynamic standing balance to perfom lateral reach with partial squat to grab bean bag and then throw to target x 8 bil with CGA for safety.   Lower body clothing management following incontinent bladder evacuation. [facility] to [facility] and all needs met.        Therapy Documentation Precautions:  Precautions Precautions: Fall Restrictions Weight Bearing Restrictions: No   Pain: Pain Assessment Pain Scale: 0-10 Pain Score: 0-No pain   Therapy/Group: Individual Therapy  ALorie Phenix10/28/2022, 5:39 PM

## 2021-05-27 NOTE — Progress Notes (Signed)
Physical Therapy Session Note  Patient Details  Name: Brandon French MRN: [id number] Date of Birth: 07-19-1966  Today's Date: 05/27/2021 PT Individual Time: 1401-1500 PT Individual Time Calculation (min): 59 min   Short Term Goals: Week 1:  PT Short Term Goal 1 (Week 1): Patient will complete be dmobility with CGA consistently PT Short Term Goal 2 (Week 1): Patient will transfer with LRAD bed <> wc with CGA consistently PT Short Term Goal 3 (Week 1): Patient will ambulate >50 with LRAD with CGA consistently Week 2:     Skilled Therapeutic Interventions/Progress Updates:  Patient seated upright in w/c on entrance to room. Patient alert and agreeable to PT session. Pt has shoes and is requesting to go outside. Session spent outdoors.   Patient with no pain complaint throughout session.  Therapeutic Activity: Pt sits in w/c and requires MaxA to doff socks and ModA to don shoes. Sitting balance maintained with supervision.  Transfers: Pt's transfer performance is improved since last working with pt. Improved forward lean with continued vc for BUE placement and use for forward push. He performed sit<>stand and stand pivot transfers throughout session with CGA and intermittent MinA for appropriate forward lean.  Gait Training:  Patient ambulated 210' x1/ 160' x1 over uneven outdoor paved surfaces using RW with CGA/ intermittent close supervision. Provided vc/ tc for unweighting of RW over cracks in paved surface, upright posture, level gaze.   Pt guided in lateral stepping using railing for UE support VC/ tc provided for hip positioning in order to maintain lateral step out as pt tends to turn toward direction he is traveling. Demos difficulty with lateral stepping and balance when reaching section with limited support for UE despite ability to perform. Encouragement provided to attempt challenge with pt unwilling to attempt this session.   Patient seated upright  in w/c at end of session  with brakes locked, belt alarm set, and all needs within reach.     Therapy Documentation Precautions:  Precautions Precautions: Fall Restrictions Weight Bearing Restrictions: No General:   Vital Signs:  Pain: Pain Assessment Pain Scale: 0-10 Pain Score: 0-No pain Mobility:   Locomotion :    Trunk/Postural Assessment :    Balance:   Exercises:   Other Treatments:      Therapy/Group: Individual Therapy  Loel Dubonnet PT, DPT 05/27/2021, 12:31 PM

## 2021-05-27 NOTE — Progress Notes (Signed)
Speech Language Pathology Weekly Progress and Session Note  Patient Details  Name: Brandon French MRN: [id number] Date of Birth: May 31, 1966  Beginning of progress report period: May 21, 2021 End of progress report period: May 27, 2021  Today's Date: 05/27/2021 SLP Individual Time: 0800-0900 SLP Individual Time Calculation (min): 60 min  Short Term Goals: Week 1: SLP Short Term Goal 1 (Week 1): Pt will consume presentations of honey thick liquids via teaspoon and dys 1 textures with supervision cues for use of swallowing precautions. SLP Short Term Goal 1 - Progress (Week 1): Progressing toward goal SLP Short Term Goal 2 (Week 1): Pt will consume therapeutic trials of honey thick liquids via cup sips with min cues for use of swallowing precautions and minimal overt s/s of aspiration. SLP Short Term Goal 2 - Progress (Week 1): Met SLP Short Term Goal 3 (Week 1): Pt will utilize overarticulation and loud voice to achieve intelligibilty at the word level in ~50% of opportunities during structured tasks. SLP Short Term Goal 3 - Progress (Week 1): Met SLP Short Term Goal 4 (Week 1): Pt will utilize multimodal communication during communication breakdowns with Min verbal cues. SLP Short Term Goal 4 - Progress (Week 1): Progressing toward goal  New Short Term Goals: Week 2: SLP Short Term Goal 1 (Week 2): STG=LTG due to ELOS  Weekly Progress Updates: Patient has made slow progress with ST over the past week and has met 2 of 4 short term goals this reporting period due to tolerance of Dys 1 diet and honey thick liquids with minimal overt s/sx of aspiration with min A verbal cues for implementation of safe swallowing strategies. Pt continues to receive full supervision during all meals to maximize safety and minimize aspiration risk. Pt is consuming therapeutic PO trials of honey thick liquids by cup and nectar thick liquids however demonstrated overt s/sx of aspiration with nectar thick  liquids thus minimizing trials of nectar thick liquids at this time. Anticipate repeat MBS prior to discharge. Patient is currently communicating at phrase level with ~50% speech intelligibility and mod-to-max A verbal/visual cues for multimodal communication. Patient/family education is ongoing. Recommend continued skilled ST intervention to maximize speech and swallowing function and functional independence prior to discharge.    Intensity: Minumum of 1-2 x/day, 30 to 90 minutes Frequency: 3 to 5 out of 7 days Duration/Length of Stay: 11/4 Treatment/Interventions: English as a second language teacher;Dysphagia/aspiration precaution training;Speech/Language facilitation;Internal/external aids;Multimodal communication approach;Environmental controls  Daily Session Skilled Therapeutic Interventions: Pt received semi reclined in bed and agreeable to skilled ST intervention with focus on swallowing and speech goals. Pt consumed Dys 1 textures and honey thick liquids by spoon with min A verbal cues to modify rate of consumption, swallowing bolus prior to next bite, and reducing bolus volume. Despite SLP's request to turn off TV during meal, pt was known to turn TV on again during several occasions which attributed to distractibility where pt took multiple bites prior to initiating swallow resulting in coughing, throat clearing, incomplete oral clearance, and increased anterior spillage. Swallow function is much improved when all distractions are intentionally minimized. SLP wrote on sign behind bed to turn off TV during all meals and notified nsg. SLP provided education on swallow anatomy using handout for visual reinforcement as well as effortful swallow maneuver. Pt's partner called during session and pt required max A cues from therapist to implement speech intelligibility strategies at word and phrase level. Pt exhibited increased difficulty with coordinating breath support for speech with more vocal  strain. SLP provided  education to partner via phone on communication strategies (cue pt to take breath, pause between words, only attempt to say 1-2 words at a time). SLP also educated on current diet restrictions and swallowing needs and likely the need to purchase a blender to modify food at home depending diet tolerance and results from anticipated MBS. Expressed educational handouts on communication and swallowing will be provided by SLP prior to discharge. Partner appreciative and expressed she would like to visit from [place] to support patient's transition to home. Patient was left in bed with alarm activated and immediate needs within reach at end of session. Continue per current plan of care.      General    Pain Pain Assessment Pain Scale: 0-10 Pain Score: 0-No pain  Therapy/Group: Individual Therapy  Patty Sermons 05/27/2021, 12:06 PM

## 2021-05-27 NOTE — Progress Notes (Signed)
Occupational Therapy Weekly Progress Note  Patient Details  Name: Brandon French MRN: [id number] Date of Birth: 1965-09-09  Beginning of progress report period: May 21, 2021 End of progress report period: May 27, 2021  Today's Date: 05/27/2021 OT Individual Time: 5427-0623 OT Individual Time Calculation (min): 55 min    Patient has met 3 of 4 short term goals.  Pt has made good progress this week towards LTG. Pt has demonstrated improved dynamic sitting and standing balance, activity tolerance, ability to compensate for deficits, and activity tolerance to presently complete UB dressing and bathing and seated grooming at S level and LB dressing, bathing, and toileting tasks at min to mod A level. Is completing ambulatory bathroom transfers with min to CGA and RW. Will benefit from continued education on AE to facilitate independence with LB ADL. Continues to have poor awareness of incontinence and toileting needs at this time time. Anticipate intermittent S and no physical assist required upon DC. Family education to be scheduled.   Patient continues to demonstrate the following deficits: muscle weakness, decreased cardiorespiratoy endurance, decreased coordination and decreased motor planning, decreased awareness, and decreased standing balance, decreased postural control, and decreased balance strategies and therefore will continue to benefit from skilled OT intervention to enhance overall performance with BADL, iADL, and Reduce care partner burden.  Patient progressing toward long term goals..  Continue plan of care.  OT Short Term Goals Week 1:  OT Short Term Goal 1 (Week 1): Pt will transfer to toilet wiht CGA OT Short Term Goal 1 - Progress (Week 1): Met OT Short Term Goal 2 (Week 1): Pt will don shirt wiht S OT Short Term Goal 2 - Progress (Week 1): Met OT Short Term Goal 3 (Week 1): Pt will stand to groom to demo improved activity tolernace OT Short Term Goal 3 - Progress  (Week 1): Met OT Short Term Goal 4 (Week 1): Pt will thread BLE into pants wiht AE PRN OT Short Term Goal 4 - Progress (Week 1): Not met Week 2:  OT Short Term Goal 1 (Week 2): STG = LTG 2/2 ELOS  Skilled Therapeutic Interventions/Progress Updates:    Pt received semi-reclined in bed, denies pain, agreeable to therapy. Session focus on self-care retraining, activity tolerance, AE education, functional transfers in prep for improved ADL/IADL/func mobility performance + decreased caregiver burden. Came to sitting EOB with close S and use of bed features. Total A to don/doff B gripper socks due to time constraints/body habitus. MD in/out morning assessment.  Ambulated to and from TTB with CGA and use of RW, required cues for BLE foot placement and split hand technique and to correct posterior bias. Bathed full-body with min A to bathe buttocks, use of LH sponge to reach back/feet. Noted to be incontinent of bladder in brief with no awareness. Donned shirt with independence. Donned brief/pants with light min A to thread RLE and use of reacher. Good carryover from previous session for technique. Completed grooming tasks with mod I seated in w/c.   Pt left seated in w/c with safety belt alarm engaged, call bell in reach, and all immediate needs met.    Therapy Documentation Precautions:  Precautions Precautions: Fall Restrictions Weight Bearing Restrictions: No  Pain:   denies ADL: See Care Tool for more details.   Therapy/Group: Individual Therapy  Volanda Napoleon MS, OTR/L  05/27/2021, 6:42 AM

## 2021-05-27 NOTE — Progress Notes (Signed)
PROGRESS NOTE   Subjective/Complaints:  No pain, moving bowels, denies cough with meals no LE swelling   ROS:No CP, SOB, N/V/D   Objective:   No results found. No results for input(s): WBC, HGB, HCT, PLT in the last 72 hours.  Recent Labs    05/26/21 0505  NA 140  K 3.6  CL 107  CO2 28  GLUCOSE 103*  BUN 31*  CREATININE 2.38*  CALCIUM 8.5*     Intake/Output Summary (Last 24 hours) at 05/27/2021 0910 Last data filed at 05/26/2021 1943 Gross per 24 hour  Intake 1679.71 ml  Output 700 ml  Net 979.71 ml         Physical Exam: Vital Signs Blood pressure 137/78, pulse (!) 56, temperature 97.6 F (36.4 C), temperature source Oral, resp. rate 16, height 6' (1.829 m), weight (!) 147.6 kg, SpO2 100 %.    General: No acute distress Mood and affect are appropriate Heart: Regular rate and rhythm no rubs murmurs or extra sounds Lungs: Clear to auscultation, breathing unlabored, no rales or wheezes Abdomen: Positive bowel sounds, soft nontender to palpation, nondistended Extremities: No clubbing, cyanosis, or edema Skin: No evidence of breakdown, no evidence of rash   Musculoskeletal:     Cervical back: Normal range of motion and neck supple.     Comments: No edema in extremities No pain to palpation Left mid foot and plantar surface, no skin lesions   Neurological:     Mental Status: He is alert and oriented to person, place, and time.     Comments: Alert Severe dysarthria Able to answer biographic questions.  He was able to follow simple motor commands.  Sensation diminished to light touch LLE Motor: RUE/RLE; 5/5 proximal to distal LUE: 4/5 proximal to distal LLE: 3-/5 proximal to distal  Psychiatric:        Mood and Affect: Mood normal.        Behavior: Behavior normal.    Assessment/Plan: 1. Functional deficits which require 3+ hours per day of interdisciplinary therapy in a comprehensive  inpatient rehab setting. Physiatrist is providing close team supervision and 24 hour management of active medical problems listed below. Physiatrist and rehab team continue to assess barriers to discharge/monitor patient progress toward functional and medical goals  Care Tool:  Bathing    Body parts bathed by patient: Right arm, Left arm, Chest, Abdomen, Front perineal area, Right upper leg, Left upper leg, Face   Body parts bathed by helper: Buttocks, Right lower leg, Left lower leg     Bathing assist Assist Level: Moderate Assistance - Patient 50 - 74%     Upper Body Dressing/Undressing Upper body dressing   What is the patient wearing?: Pull over shirt    Upper body assist Assist Level: Supervision/Verbal cueing    Lower Body Dressing/Undressing Lower body dressing      What is the patient wearing?: Incontinence brief     Lower body assist Assist for lower body dressing: Maximal Assistance - Patient 25 - 49%     Toileting Toileting    Toileting assist Assist for toileting: Maximal Assistance - Patient 25 - 49%     Transfers Chair/bed transfer  Transfers assist  Chair/bed transfer activity did not occur: Refused  Chair/bed transfer assist level: Minimal Assistance - Patient > 75% (stand pivot) Chair/bed transfer assistive device: Armrests, Geologist, engineering   Ambulation assist      Assist level: Minimal Assistance - Patient > 75% Assistive device: Walker-rolling Max distance: 150ft   Walk 10 feet activity   Assist     Assist level: Minimal Assistance - Patient > 75% Assistive device: Walker-rolling   Walk 50 feet activity   Assist Walk 50 feet with 2 turns activity did not occur: Safety/medical concerns         Walk 150 feet activity   Assist Walk 150 feet activity did not occur: Safety/medical concerns         Walk 10 feet on uneven surface  activity   Assist Walk 10 feet on uneven surfaces activity did not  occur: Safety/medical concerns         Wheelchair     Assist Is the patient using a wheelchair?: No             Wheelchair 50 feet with 2 turns activity    Assist            Wheelchair 150 feet activity     Assist          Blood pressure 137/78, pulse (!) 56, temperature 97.6 F (36.4 C), temperature source Oral, resp. rate 16, height 6' (1.829 m), weight (!) 147.6 kg, SpO2 100 %.    Medical Problem List and Plan: 1.  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            Continue CIR PT, OT, SLP   2.  Impaired mobility: continue Lovenox. Dopplers reviewed and negative for clots.              -antiplatelet therapy: DAPT x1 month followed by Plavix alone. 3. Pain Management: Tylenol as needed.Thalamic pain , LLE start gabapentin 100mg  Qhs may need to titrate up 4. Mood: LCSW to follow for evaluation and support.             -antipsychotic agents: N/A 5. Neuropsych: This patient is not capable of making decisions on his own behalf. 6. Skin/Wound Care: Routine pressure-relief measures. 7. Fluids/Electrolytes/Nutrition: Will monitor intake/output.              CMP ordered for tomorrow AM             -- Will need assistance/encouragement for intake. 8.  HTN: Monitor blood pressures 3 times daily 5-7 days.              -was on Toprol XL 100mg  , Clonidine 0.1mg  BID PTA, will resume clonidine             Monitor with increased exertion  -increase amlodipine to 10mg    Discontinue hydralazine 10mg  TID, patient back on clonidine today Vitals:   05/26/21 1950 05/27/21 0505  BP: (!) 160/82 137/78  Pulse: 62 (!) 56  Resp: 14 16  Temp: 97.7 F (36.5 C) 97.6 F (36.4 C)  SpO2: 99% 100%  Improved  10/28 Continue  metoprolol tartrate 12.5mg  BID,  will need to monitor for bradycardia. 9.  T2DM with hyperglycemia: Hgb A1C- 6.6.  Was on metformin prior to admission which is being held due to CKD?             -- Monitor blood sugars achs and use SSI for elevated BS         CBG (last 3)  Recent Labs    05/26/21 1648 05/26/21 2055 05/27/21 0552  GLUCAP 101* 147* 102*   Controlled 10/28  10.  CKD stage IIIb: Serum creatinine 1.92 range per records., slightly above baseline              -- increase IVF to 34mls/hr for 12 hours given uptrending creatinine, repeat sl better  Change to .45 NS ,K+ 3.6 on 10/28 11.  Possible HCOM: Severe asymmetric LV septal hypertrophy per echo             -- Will need referral to cardiology at discharge 12. 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 Will need assistance with fluid intake.  Dysphagia and dysarthria related to recent left subcortical infarcts.  Repeat modified barium swallow once patient clinically able to handle nectar liquids with speech therapy.  Hope this can be done next week prior to discharge. 13. Dyslipidemia: LDL 139. On Crestor.  14. Morbid obesity: Educate on importance of diet/wt loss to promote health and mobility.   15.BPH?: Indicating difficulty with voiding--will resume Proscar and Flomax.  --toilet every 3-4 hours/assist with urinal 16. Assisted, witnessed fall: patient assessed and without any negative consequences of fall.  17. Bradycardia: monitor on BB on reduced dose compared to home dose we will place parameters   LOS: 7 days A FACE TO FACE EVALUATION WAS PERFORMED  Erick Colace 05/27/2021, 9:10 AM

## 2021-05-27 NOTE — Plan of Care (Signed)
  Problem: RH Expression Communication Goal: LTG Patient will increase speech intelligibility (SLP) Description: LTG: Patient will increase speech intelligibility at word/phrase/conversation level with cues, % of the time (SLP) 05/27/2021 1205 by Sonny Masters T, CCC-SLP Note: Goal downgraded due to slow progress 05/27/2021 1205 by Sonny Masters T, CCC-SLP Flowsheets (Taken 05/27/2021 1205) LTG: Patient will increase speech intelligibility (SLP): Moderate Assistance - Patient 50 - 74% Level: Phrase

## 2021-05-28 LAB — GLUCOSE, CAPILLARY
Glucose-Capillary: 89 mg/dL (ref 70–99)
Glucose-Capillary: 133 mg/dL — ABNORMAL HIGH (ref 70–99)
Glucose-Capillary: 96 mg/dL (ref 70–99)
Glucose-Capillary: 174 mg/dL — ABNORMAL HIGH (ref 70–99)

## 2021-05-28 NOTE — Progress Notes (Signed)
Physical Therapy Session Note  Patient Details  Name: Brandon French MRN: [id number] Date of Birth: [birth year]  Today's Date: 05/28/2021 PT Individual Time: 0906-1000 PT Individual Time Calculation (min): 54 min   Short Term Goals: Week 1:  PT Short Term Goal 1 (Week 1): Patient will complete be dmobility with CGA consistently PT Short Term Goal 2 (Week 1): Patient will transfer with LRAD bed <> wc with CGA consistently PT Short Term Goal 3 (Week 1): Patient will ambulate >50 with LRAD with CGA consistently Week 2:     Skilled Therapeutic Interventions/Progress Updates:   Pt received supine in bed and agreeable to PT. Supine>sit transfer with supervision assist and cues for UE position to improve trunk control.  Upper and lower body dressing at EOB with mod assist for pants, max assist for shoes, and min assist for shirt management.  Sit<>stand throughout session from various surfaces with CGA-min A* with cues for improved anterior weight shift.   Dynamic gait training with RW to weave through cones and step over hockey sticks. With min assist for safety and moderate cues for AD management with improved posture awareness of obstacles throughout.   Stair management training with BUE support 2 x 8steps with min assist. Noted to ascend with LLE and and descend with RLE. No LOB noted.   NUstep reciprocal movement training 3 min +4 min with prolonged rest break between bouts and cues for decreased speed to prevent excessive fatigue.   Patient returned to room and left sitting in [facility] with call bell in reach and all needs met.         Therapy Documentation Precautions:  Precautions Precautions: Fall Restrictions Weight Bearing Restrictions: No   Pain: Pain Assessment Pain Scale: 0-10 Pain Score: 0-No pain     Therapy/Group: Individual Therapy  Lorie Phenix 05/28/2021, 10:03 AM

## 2021-05-28 NOTE — Progress Notes (Signed)
Speech Language Pathology Daily Session Note  Patient Details  Name: Brandon French MRN: [id number] Date of Birth: August 01, 1965  Today's Date: 05/28/2021 SLP Individual Time: 1400-1445 SLP Individual Time Calculation (min): 45 min  Short Term Goals: Week 2: SLP Short Term Goal 1 (Week 2): STG=LTG due to ELOS  Skilled Therapeutic Interventions: Pt received upright in wheelchair and agreeable to skilled ST intervention with focus on swallowing/speech goals and family education with pt's mother. Upon arrival pt exhibited decreased oral containment of secretions requiring verbal cues to swallow and utilize suction to clear from bottom lip region. Pt consumed therapeutic PO trials of honey thick liquids by cup (~6oz) with cough response x1. No other overt s/sx of aspiration noted and clear vocal quality post swallows. Pt educated to continue consumption of HTL via spoon when SLP is not present. SLP provided extensive education in both verbal and written forms on current swallow function, diet recommendations, swallowing precautions/strategies, purchase information for thickened liquids, and hands-on demonstration for thickening liquids. Pt demonstrated understanding on thickening liquids via demonstration with 100% accuracy as well as problem solving to determine how much thickener is used depending on fluid oz. Mother verbalized understanding via teach back. SLP also educated on current speech functioning and communication strategies. Pt demonstrated understanding by implementing at phrase level with mod A verbal cues. Mother verbalized understanding via teach back. Pt and mother indicated interest in further family training with therapy team prior to discharge. SLP informed team via email. Patient was left in wheelchair with alarm activated and immediate needs within reach at end of session. Continue per current plan of care.      Pain Pain Assessment Pain Scale: 0-10 Pain Score: 0-No  pain  Therapy/Group: Individual Therapy  Tamala Ser 05/28/2021, 3:33 PM

## 2021-05-29 LAB — GLUCOSE, CAPILLARY
Glucose-Capillary: 98 mg/dL (ref 70–99)
Glucose-Capillary: 177 mg/dL — ABNORMAL HIGH (ref 70–99)
Glucose-Capillary: 120 mg/dL — ABNORMAL HIGH (ref 70–99)
Glucose-Capillary: 196 mg/dL — ABNORMAL HIGH (ref 70–99)

## 2021-05-29 NOTE — Progress Notes (Signed)
PROGRESS NOTE   Subjective/Complaints:  Pt reports bowels moving-  No complaints- no therapy today- sitting up in bedside chair- with IVFs 75cc/hour going.    [id number] by communication- but no complaints  Objective:   No results found. No results for input(s): WBC, HGB, HCT, PLT in the last 72 hours.  No results for input(s): NA, K, CL, CO2, GLUCOSE, BUN, CREATININE, CALCIUM in the last 72 hours.  Intake/Output Summary (Last 24 hours) at 05/29/2021 1439 Last data filed at 05/29/2021 1315 Gross per 24 hour  Intake 2642.62 ml  Output --  Net 2642.62 ml        Physical Exam: Vital Signs Blood pressure 138/80, pulse 72, temperature 98.5 F (36.9 C), temperature source Oral, resp. rate 18, height 6' (1.829 m), weight (!) 147.6 kg, SpO2 100 %.     General: awake, alert, appropriate,sitting up in bedside chair, nurse in room;  NAD HENT: conjugate gaze; oropharynx moist CV: regular rate; no JVD Pulmonary: CTA B/L; no W/R/R- good air movement GI: soft, NT, ND, (+)BS; protuberant Psychiatric: appropriate Neurological: alert- very dysarthric - hard to pronounce words  Musculoskeletal:     Cervical back: Normal range of motion and neck supple.     Comments: No edema in extremities No pain to palpation Left mid foot and plantar surface, no skin lesions   Neurological:     Mental Status: He is alert and oriented to person, place, and time.     Comments: Alert Severe dysarthria Able to answer biographic questions.  He was able to follow simple motor commands.  Sensation diminished to light touch LLE Motor: RUE/RLE; 5/5 proximal to distal LUE: 4/5 proximal to distal LLE: 3-/5 proximal to distal  Psychiatric:        Mood and Affect: Mood normal.        Behavior: Behavior normal.    Assessment/Plan: 1. Functional deficits which require 3+ hours per day of interdisciplinary therapy in a comprehensive inpatient  rehab setting. Physiatrist is providing close team supervision and 24 hour management of active medical problems listed below. Physiatrist and rehab team continue to assess barriers to discharge/monitor patient progress toward functional and medical goals  Care Tool:  Bathing    Body parts bathed by patient: Right arm, Left arm, Chest, Abdomen, Front perineal area, Right upper leg, Left upper leg, Face, Right lower leg, Left lower leg   Body parts bathed by helper: Buttocks, Right lower leg, Left lower leg     Bathing assist Assist Level: Minimal Assistance - Patient > 75%     Upper Body Dressing/Undressing Upper body dressing   What is the patient wearing?: Pull over shirt    Upper body assist Assist Level: Set up assist    Lower Body Dressing/Undressing Lower body dressing      What is the patient wearing?: Incontinence brief, Pants     Lower body assist Assist for lower body dressing: Minimal Assistance - Patient > 75% (reacher)     Toileting Toileting    Toileting assist Assist for toileting: Maximal Assistance - Patient 25 - 49%     Transfers Chair/bed transfer  Transfers assist  Chair/bed transfer activity did not  occur: Refused  Chair/bed transfer assist level: Minimal Assistance - Patient > 75% (stand pivot) Chair/bed transfer assistive device: Armrests, Geologist, engineering   Ambulation assist      Assist level: Minimal Assistance - Patient > 75% Assistive device: Walker-rolling Max distance: 153ft   Walk 10 feet activity   Assist     Assist level: Minimal Assistance - Patient > 75% Assistive device: Walker-rolling   Walk 50 feet activity   Assist Walk 50 feet with 2 turns activity did not occur: Safety/medical concerns         Walk 150 feet activity   Assist Walk 150 feet activity did not occur: Safety/medical concerns         Walk 10 feet on uneven surface  activity   Assist Walk 10 feet on uneven surfaces  activity did not occur: Safety/medical concerns         Wheelchair     Assist Is the patient using a wheelchair?: No             Wheelchair 50 feet with 2 turns activity    Assist            Wheelchair 150 feet activity     Assist          Blood pressure 138/80, pulse 72, temperature 98.5 F (36.9 C), temperature source Oral, resp. rate 18, height 6' (1.829 m), weight (!) 147.6 kg, SpO2 100 %.    Medical Problem List and Plan: 1.  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            con't PT, OT and SLP 2.  Impaired mobility: continue Lovenox. Dopplers reviewed and negative for clots.              -antiplatelet therapy: DAPT x1 month followed by Plavix alone. 3. Pain Management: Tylenol as needed.Thalamic pain , LLE start gabapentin 100mg  Qhs may need to titrate up 4. Mood: LCSW to follow for evaluation and support.             -antipsychotic agents: N/A 5. Neuropsych: This patient is not capable of making decisions on his own behalf. 6. Skin/Wound Care: Routine pressure-relief measures. 7. Fluids/Electrolytes/Nutrition: Will monitor intake/output.              CMP ordered for tomorrow AM             -- Will need assistance/encouragement for intake. 8.  HTN: Monitor blood pressures 3 times daily 5-7 days.              -was on Toprol XL 100mg  , Clonidine 0.1mg  BID PTA, will resume clonidine             Monitor with increased exertion  -increase amlodipine to 10mg    Discontinue hydralazine 10mg  TID, patient back on clonidine today Vitals:   05/29/21 0354 05/29/21 1310  BP: 139/78 138/80  Pulse: (!) 50 72  Resp: 16 18  Temp: 98.7 F (37.1 C) 98.5 F (36.9 C)  SpO2: 95% 100%  Improved  10/28 Continue  metoprolol tartrate 12.5mg  BID,       will need to monitor for bradycardia.  10/30- held Metoprolol due to bradycardia- put parameters on it- to hold if <60. BP controlled.  9.  T2DM with hyperglycemia: Hgb A1C- 6.6.  Was on metformin prior to admission which is being held due to CKD?             -- Monitor blood sugars achs and use SSI for elevated BS         CBG (last 3)  Recent Labs    05/28/21 2104 05/29/21 0600 05/29/21 1151  GLUCAP 174* 98 177*  10/30- overall controllled con't regimen  10.  CKD stage IIIb: Serum creatinine 1.92 range per records., slightly above baseline              -- increase IVF to 22mls/hr for 12 hours given uptrending creatinine, repeat sl better  Change to .45 NS ,K+ 3.6 on 10/28  10/30- still on IVFs 75cc/hour due to being on honey thick liquids-  11.  Possible HCOM: Severe asymmetric LV septal hypertrophy per echo             -- Will need referral to cardiology at discharge 12. 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 Will need assistance with fluid intake.  Dysphagia and dysarthria related to recent left subcortical infarcts.  Repeat modified barium swallow once patient clinically able to handle nectar liquids with speech therapy.  Hope this can be done next week prior to discharge. 13. Dyslipidemia: LDL 139. On Crestor.  14. Morbid obesity: Educate on importance of diet/wt loss to promote health and mobility.   15.BPH?: Indicating difficulty with voiding--will resume Proscar and Flomax.  --toilet every 3-4 hours/assist with urinal 16. Assisted, witnessed fall: patient assessed and without any negative consequences of fall.  17. Bradycardia: monitor on BB on reduced dose compared to home dose we will place parameters  10/30- holding B blocker sometimes due to parameters/low HR- con't regimen   LOS: 9 days A FACE TO FACE EVALUATION WAS PERFORMED  Brandon French 05/29/2021, 2:39 PM

## 2021-05-30 LAB — BASIC METABOLIC PANEL
Anion gap: 5 (ref 5–15)
BUN: 26 mg/dL — ABNORMAL HIGH (ref 6–20)
CO2: 28 mmol/L (ref 22–32)
Calcium: 8.3 mg/dL — ABNORMAL LOW (ref 8.9–10.3)
Chloride: 105 mmol/L (ref 98–111)
Creatinine, Ser: 2.17 mg/dL — ABNORMAL HIGH (ref 0.61–1.24)
GFR, Estimated: 36 mL/min — ABNORMAL LOW (ref >60)
Glucose, Bld: 101 mg/dL — ABNORMAL HIGH (ref 70–99)
Potassium: 3.9 mmol/L (ref 3.5–5.1)
Sodium: 138 mmol/L (ref 135–145)

## 2021-05-30 LAB — GLUCOSE, CAPILLARY
Glucose-Capillary: 108 mg/dL — ABNORMAL HIGH (ref 70–99)
Glucose-Capillary: 145 mg/dL — ABNORMAL HIGH (ref 70–99)
Glucose-Capillary: 126 mg/dL — ABNORMAL HIGH (ref 70–99)
Glucose-Capillary: 169 mg/dL — ABNORMAL HIGH (ref 70–99)

## 2021-05-30 LAB — CBC
HCT: 35.4 % — ABNORMAL LOW (ref 39.0–52.0)
Hemoglobin: 10.6 g/dL — ABNORMAL LOW (ref 13.0–17.0)
MCH: 23.8 pg — ABNORMAL LOW (ref 26.0–34.0)
MCHC: 29.9 g/dL — ABNORMAL LOW (ref 30.0–36.0)
MCV: 79.6 fL — ABNORMAL LOW (ref 80.0–100.0)
Platelets: 337 10*3/uL (ref 150–400)
RBC: 4.45 MIL/uL (ref 4.22–5.81)
RDW: 14.9 % (ref 11.5–15.5)
WBC: 7.3 10*3/uL (ref 4.0–10.5)
nRBC: 0 % (ref 0.0–0.2)

## 2021-05-30 NOTE — Progress Notes (Signed)
Occupational Therapy Session Note  Patient Details  Name: Brandon French MRN: [id number] Date of Birth: January 29, 1966  Today's Date: 05/30/2021 OT Individual Time: 1402-1502 OT Individual Time Calculation (min): 60 min    Short Term Goals: Week 2:  OT Short Term Goal 1 (Week 2): STG = LTG 2/2 ELOS  Skilled Therapeutic Interventions/Progress Updates:   Pt received in wc with no c/o pain and agreeable to OT session. Declined BADLs at this time. Pt wheeled down to ortho gym for time management.   Therapeutic activity Pt completes 4 trials on BITS program to focus on word finding, visual memory and cognitive retraining. Pt with 100% success during cognitive task using 3 word recall with picture sequence. Pt was asked to read words out loud and demonstrated excellent recall with minor frustration at speech difficulties. OTS graded task up to add 4 words and pt with 0% accuracy, partially d/t [place] of BUE in tapping target. Pt given rest break before trying same task again. OTS provided min cuing for strategies to increase success - pt with 25% accuracy on second trial.   Pt worked on dynamic reaching task in standing table, demonstrating good bimanual use of UE to cross midline with switching arms to place cones and reach. OTS grades activity to increase speed and extended reach. Pt demo'd good visual perceptual skills in FM activity standing to match tangram puzzle to card deck. Pt asking how well he did during activities - OTS provides rationale for each activity, and feedback for functional tasks when home. Pt verbalized that he was mostly frustrated at his speech, and upset that his family does not understand him (or try to). OT provided therapeutic listening and encouragement.   Pt completes beach ball volley with 4# dowel to work on UB strengthening and activity tolerance for functional transfers. Pt needed mod cuing to use arms symmetrically as his LUE consistently lagged behind dominant RUE. Pt  completes 20 repetitions, reporting his arms were sore afterwards. Pt left at end of session in wc with exit belt alarm on, call light in reach and all needs met   Therapy Documentation Precautions:  Precautions Precautions: Fall Restrictions Weight Bearing Restrictions: No   Therapy/Group: Individual Therapy  Becky Berberian 05/30/2021, 3:10 PM

## 2021-05-30 NOTE — Progress Notes (Signed)
Occupational Therapy Session Note  Patient Details  Name: Brandon French MRN: [id number] Date of Birth: 1966/03/05  Today's Date: 05/30/2021 OT Individual Time: 2863-8177 OT Individual Time Calculation (min): 43 min    Short Term Goals: Week 1:  OT Short Term Goal 1 (Week 1): Pt will transfer to toilet wiht CGA OT Short Term Goal 1 - Progress (Week 1): Met OT Short Term Goal 2 (Week 1): Pt will don shirt wiht S OT Short Term Goal 2 - Progress (Week 1): Met OT Short Term Goal 3 (Week 1): Pt will stand to groom to demo improved activity tolernace OT Short Term Goal 3 - Progress (Week 1): Met OT Short Term Goal 4 (Week 1): Pt will thread BLE into pants wiht AE PRN OT Short Term Goal 4 - Progress (Week 1): Not met Week 2:  OT Short Term Goal 1 (Week 2): STG = LTG 2/2 ELOS  Skilled Therapeutic Interventions/Progress Updates:    Pt received in bed with no c/o pain and agreeable to OT. Pt receiving IV meds at this time and BADLs performed at sink level.   ADL: Pt completes BADL at overall min A level. Skilled interventions include: Pt able to let OTS know that he wanted to continue wearing a gown with scrub pants. Pt completes bed mobility with use of bed features and close (S). Requires min A to squat > pivot to wc and mod instructional cuing for hand placement. Pt doffs gown with min A for IV mgmt, and completes UB bathing with setup. Minimal wrist flex/ext noted in LUE, but with functional movement to apply deodorant with LUE. Pt able to stand from wc at sink with MIN A to doff clothing over hips. Pt voided urine standing while washing LB at sink. Pt unaware at first. RN disconnected IV mid OT session, with MD also stopping by. Pt able to wash lower legs but required MOD A to thread legs into pants d/t body habitus. Would benefit from reacher to do so independently. Pt left at end of session in wc with exit alarm on, call light in reach and all needs met   Therapy  Documentation Precautions:  Precautions Precautions: Fall Restrictions Weight Bearing Restrictions: No   Therapy/Group: Individual Therapy  Nastasia Kage 05/30/2021, 7:18 AM

## 2021-05-30 NOTE — Progress Notes (Signed)
PROGRESS NOTE   Subjective/Complaints: No issues overnite   [id number] by communication- but no complaints  Objective:   No results found. Recent Labs    05/30/21 0600  WBC 7.3  HGB 10.6*  HCT 35.4*  PLT 337    No results for input(s): NA, K, CL, CO2, GLUCOSE, BUN, CREATININE, CALCIUM in the last 72 hours.  Intake/Output Summary (Last 24 hours) at 05/30/2021 0830 Last data filed at 05/29/2021 2204 Gross per 24 hour  Intake 2412.62 ml  Output 125 ml  Net 2287.62 ml         Physical Exam: Vital Signs Blood pressure (!) 167/86, pulse (!) 57, temperature 98.2 F (36.8 C), temperature source Oral, resp. rate 18, height 6' (1.829 m), weight (!) 147.6 kg, SpO2 97 %.   General: No acute distress Mood and affect are appropriate Heart: Regular rate and rhythm no rubs murmurs or extra sounds Lungs: Clear to auscultation, breathing unlabored, no rales or wheezes Abdomen: Positive bowel sounds, soft nontender to palpation, nondistended Extremities: No clubbing, cyanosis, or edema Skin: No evidence of breakdown, no evidence of rash   Musculoskeletal:     Cervical back: Normal range of motion and neck supple.     Comments: No edema in extremities No pain to palpation Left mid foot and plantar surface, no skin lesions   Neurological:     Mental Status: He is alert and oriented to person, place, and time.     Comments: Alert Severe dysarthria Able to answer biographic questions.  He was able to follow simple motor commands.  Sensation diminished to light touch LLE Motor: RUE/RLE; 5/5 proximal to distal LUE: 4/5 proximal to distal LLE: 3-/5 proximal to distal  Psychiatric:        Mood and Affect: Mood normal.        Behavior: Behavior normal.    Assessment/Plan: 1. Functional deficits which require 3+ hours per day of interdisciplinary therapy in a comprehensive inpatient rehab setting. Physiatrist is  providing close team supervision and 24 hour management of active medical problems listed below. Physiatrist and rehab team continue to assess barriers to discharge/monitor patient progress toward functional and medical goals  Care Tool:  Bathing    Body parts bathed by patient: Right arm, Left arm, Chest, Abdomen, Front perineal area, Right upper leg, Left upper leg, Face, Right lower leg, Left lower leg   Body parts bathed by helper: Buttocks, Right lower leg, Left lower leg     Bathing assist Assist Level: Minimal Assistance - Patient > 75%     Upper Body Dressing/Undressing Upper body dressing   What is the patient wearing?: Pull over shirt    Upper body assist Assist Level: Set up assist    Lower Body Dressing/Undressing Lower body dressing      What is the patient wearing?: Incontinence brief, Pants     Lower body assist Assist for lower body dressing: Minimal Assistance - Patient > 75% (reacher)     Toileting Toileting    Toileting assist Assist for toileting: Maximal Assistance - Patient 25 - 49%     Transfers Chair/bed transfer  Transfers assist  Chair/bed transfer activity did not occur:  Refused  Chair/bed transfer assist level: Minimal Assistance - Patient > 75% (stand pivot) Chair/bed transfer assistive device: Armrests, Programmer, multimedia   Ambulation assist      Assist level: Minimal Assistance - Patient > 75% Assistive device: Walker-rolling Max distance: 149ft   Walk 10 feet activity   Assist     Assist level: Minimal Assistance - Patient > 75% Assistive device: Walker-rolling   Walk 50 feet activity   Assist Walk 50 feet with 2 turns activity did not occur: Safety/medical concerns         Walk 150 feet activity   Assist Walk 150 feet activity did not occur: Safety/medical concerns         Walk 10 feet on uneven surface  activity   Assist Walk 10 feet on uneven surfaces activity did not occur:  Safety/medical concerns         Wheelchair     Assist Is the patient using a wheelchair?: No             Wheelchair 50 feet with 2 turns activity    Assist            Wheelchair 150 feet activity     Assist          Blood pressure (!) 167/86, pulse (!) 57, temperature 98.2 F (36.8 C), temperature source Oral, resp. rate 18, height 6' (1.829 m), weight (!) 147.6 kg, SpO2 97 %.    Medical Problem List and Plan: 1.  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            con't PT, OT and SLP 2.  Impaired mobility: continue Lovenox. Dopplers reviewed and negative for clots.              -antiplatelet therapy: DAPT x1 month followed by Plavix alone. 3. Pain Management: Tylenol as needed.Thalamic pain , LLE start gabapentin 100mg  Qhs may need to titrate up 4. Mood: LCSW to follow for evaluation and support.             -antipsychotic agents: N/A 5. Neuropsych: This patient is not capable of making decisions on his own behalf. 6. Skin/Wound Care: Routine pressure-relief measures. 7. Fluids/Electrolytes/Nutrition: Will monitor intake/output.              CMP ordered for tomorrow AM             -- Will need assistance/encouragement for intake. 8.  HTN: Monitor blood pressures 3 times daily 5-7 days.              -was on Toprol XL 100mg  , Clonidine 0.1mg  BID PTA, will resume clonidine             Monitor with increased exertion  -increase amlodipine to 10mg    Discontinue hydralazine 10mg  TID, patient back on clonidine today Vitals:   05/29/21 1956 05/30/21 0537  BP: (!) 155/80 (!) 167/86  Pulse: 61 (!) 57  Resp: 18 18  Temp: 98.5 F (36.9 C) 98.2 F (36.8 C)  SpO2: 100% 97%  Elevated 10/31increased clonidine to  TID Continue metoprolol tartrate 12.5mg  BID,       will  need to monitor for bradycardia.  10/30- held Metoprolol due to bradycardia- put parameters on it- to hold if <60. BP controlled.  9.  T2DM with hyperglycemia: Hgb A1C- 6.6.  Was on metformin prior to admission which is being held due to CKD?             -- Monitor blood sugars achs and use SSI for elevated BS         CBG (last 3)  Recent Labs    05/29/21 1650 05/29/21 2100 05/30/21 0613  GLUCAP 120* 196* 108*   10/31 controlled   10.  CKD stage IIIb: Serum creatinine 1.92 range per records., slightly above baseline              -- cont IVF to 59mls/hr for 12 hours  Change to .45 NS ,K+ 3.6 on 10/28  11.  Possible HCOM: Severe asymmetric LV septal hypertrophy per echo             -- Will need referral to cardiology at discharge 12. 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 Will need assistance with fluid intake.  Dysphagia and dysarthria related to recent left subcortical infarcts.  Repeat modified barium swallow once patient clinically able to handle nectar liquids with speech therapy.  Hope this can be done next week prior to discharge. 13. Dyslipidemia: LDL 139. On Crestor.  14. Morbid obesity: Educate on importance of diet/wt loss to promote health and mobility.   15.BPH?: Indicating difficulty with voiding--will resume Proscar and Flomax.  --toilet every 3-4 hours/assist with urinal 16. Assisted, witnessed fall: patient assessed and without any negative consequences of fall.  17. Bradycardia: monitor on BB on reduced dose compared to home dose we will place parameters  10/30- holding B blocker sometimes due to parameters/low HR- con't regimen   LOS: 10 days A FACE TO FACE EVALUATION WAS PERFORMED  Charlett Blake 05/30/2021, 8:30 AM

## 2021-05-30 NOTE — Progress Notes (Signed)
Speech Language Pathology Daily Session Note  Patient Details  Name: Brandon French MRN: [id number] Date of Birth: [birth year]  Today's Date: 05/30/2021 SLP Individual Time: 1045-1130 SLP Individual Time Calculation (min): 45 min  Short Term Goals: Week 2: SLP Short Term Goal 1 (Week 2): STG=LTG due to ELOS  Skilled Therapeutic Interventions:   Pt was seen in room for ST tx with focus on dysphagia and dysarthria. Pt required cueing to swallow saliva to reduce anterior labial spillage throughout session. SLP completed trials of HTL by cup. Pt was able to recall amount of thickener needed per 8 oz. He required additional cue to stir liquid prior to drinking. Pt did not exhibit any overt s/sx of aspiration and did not demonstrate any anterior labial spillage with HTL by cup. Pt was given a spoon prior to exiting the room to cont drinks independently via tsp. Between cup sips, pt practiced utilizing dysarthria strategies (over articulation, increased intensity) while imitating 2-word phrases. Pt required modA verbal cueing to increase intelligibility. Pt participated in UNO card game and was instructed to say which card he was lying down (ex: Blue card, number 6). He required modA to utilize strategies consistently. Pt was left in chair, chair alarm in place, and all immediate needs in reach.    Pain Pain Assessment Pain Score: 0-No pain  Therapy/Group: Individual Therapy  Dorena Bodo MS, CCC-SLP, CBIS  05/30/2021, 2:56 PM

## 2021-05-30 NOTE — Progress Notes (Signed)
Physical Therapy Session Note  Patient Details  Name: Brandon French MRN: [id number] Date of Birth: 1965/08/29  Today's Date: 05/30/2021 PT Individual Time: 1300-1401 PT Individual Time Calculation (min): 61 min   Short Term Goals: Week 1:  PT Short Term Goal 1 (Week 1): Patient will complete be dmobility with CGA consistently PT Short Term Goal 2 (Week 1): Patient will transfer with LRAD bed <> wc with CGA consistently PT Short Term Goal 3 (Week 1): Patient will ambulate >50 with LRAD with CGA consistently Week 2:     Skilled Therapeutic Interventions/Progress Updates:  Patient seated upright in w/c on entrance to room. Patient alert and agreeable to PT session.   Patient with no pain complaint throughout session.  Therapeutic Activity: Transfers: Patient performed sit<>stand and stand pivot transfers throughout session with overall close supervision and intermittent CGA for instances of inadequate forward weight shift. Provided verbal cues for forward scoot, anterior weight shift, and decreasing BLE extensor push into seat. NMR focus on sit<>stand with no AD and balance attainment.  Gait Training:  Patient ambulated 175' x1/ 150' x1/ 100' x1 using RW with CGA/ close supervision. Demonstrated forward lean into BUE with increased downward pressure into walker. Provided vc/ tc for upright posture, increasing LLE step height with fatigue.  Neuromuscular Re-ed: NMR facilitated during session with focus on standing balance and balance strategies, along with dysarthria. Pt guided in static stance on Airex pad in front of board with alphabet listed. Pt guided in stance on Airex with UUE support with eyes closed. Other UE pointing to letter on board and pt asked to name a word that starts with that letter for differing subjects , such as Halloween items, Food, Places, Things. No LOB noted and pt able to enunciate words with improving consonant sounds.   Pt also guided in static stance on floor  with no AD with called reach to colored discs placed just anterior to limit of pt's BOS. Pt touching correct discs with correct called hand with some hesitancy d/t distance of reach but performs with no LOB. Is also able to state color of disc with significant improvement in entire pronunciation of colors.   NMR performed for improvements in motor control and coordination, balance, sequencing, judgement, and self confidence/ efficacy in performing all aspects of mobility at highest level of independence.   Pt relates disappointment in family members who do not interact in conversation with him and do not even understand when he says, "What's up?" Discussed with pt that it will take time for them to come to understand him better. Family is most likely scared to attempt verbal conversation d/t lack of understanding and not wanting pt to feel frustrated as well as not wanting to feel frustrated themselves. He relates that they don't even want to attempt conversation with use of writing on paper. With family education approaching, pt assured that therapy will include family in on education on importance to continue to communicate with pt in order to progress hid dysarthria as well as their understanding of his enunciations.   Patient seated upright  in w/c at end of session with brakes locked, no alarm set as Ot arriving for next session.     Therapy Documentation Precautions:  Precautions Precautions: Fall Restrictions Weight Bearing Restrictions: No General:   Vital Signs: Therapy Vitals Temp: 98.4 F (36.9 C) Temp Source: Oral Pulse Rate: (!) 49 Resp: 17 BP: 135/67 Patient Position (if appropriate): Sitting Oxygen Therapy SpO2: 98 % O2 Device: Room  Air Pain: Pain Assessment Pain Score: 0-No pain  Therapy/Group: Individual Therapy  Loel Dubonnet PT, DPT 05/30/2021, 4:25 PM

## 2021-05-31 ENCOUNTER — Inpatient Hospital Stay (HOSPITAL_COMMUNITY): Payer: Medicare HMO

## 2021-05-31 LAB — GLUCOSE, CAPILLARY
Glucose-Capillary: 106 mg/dL — ABNORMAL HIGH (ref 70–99)
Glucose-Capillary: 160 mg/dL — ABNORMAL HIGH (ref 70–99)
Glucose-Capillary: 100 mg/dL — ABNORMAL HIGH (ref 70–99)
Glucose-Capillary: 172 mg/dL — ABNORMAL HIGH (ref 70–99)

## 2021-05-31 LAB — URIC ACID: Uric Acid, Serum: 7 mg/dL (ref 3.7–8.6)

## 2021-05-31 IMAGING — CR DG TOE GREAT 2+V*R*
3 series · 3 of 3 positions shown · non-contrast
Comparison: None.

CLINICAL DATA: Right great toe pain.

EXAM:
RIGHT GREAT TOE

[toe ap]
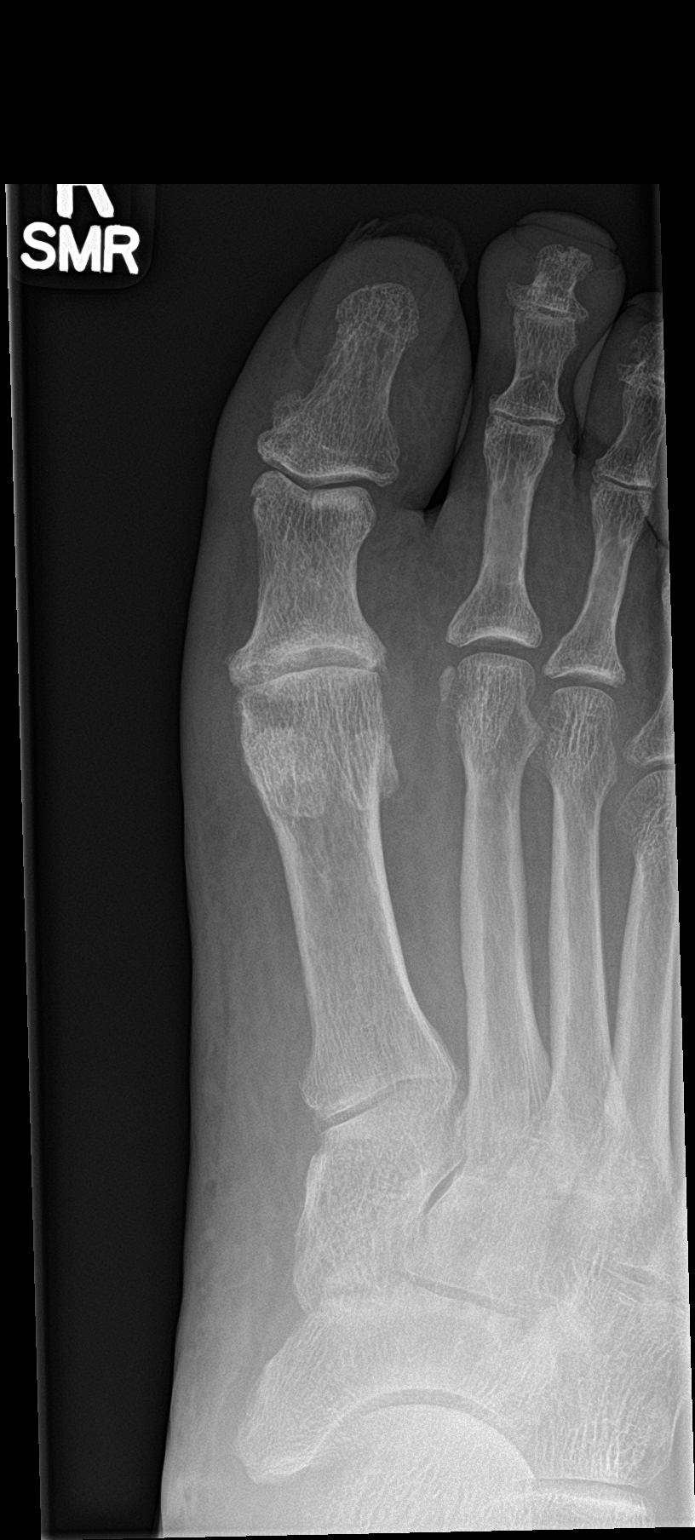

[toe obl]
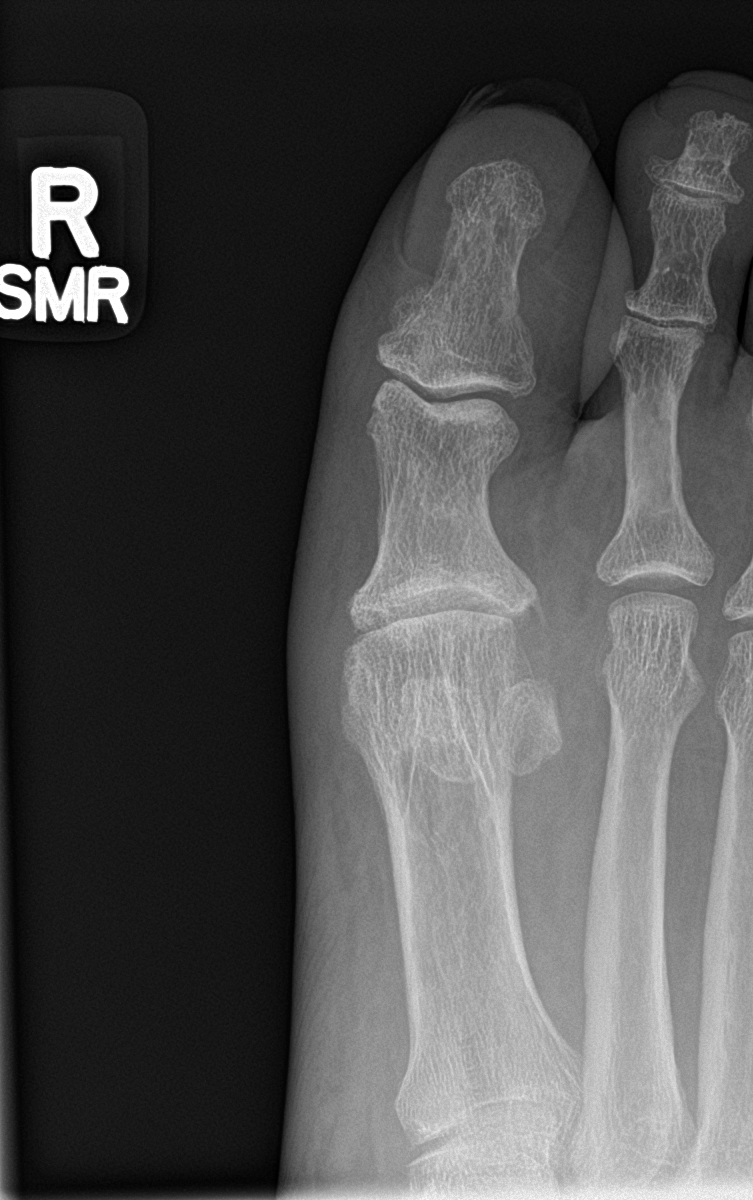

[toe lat]
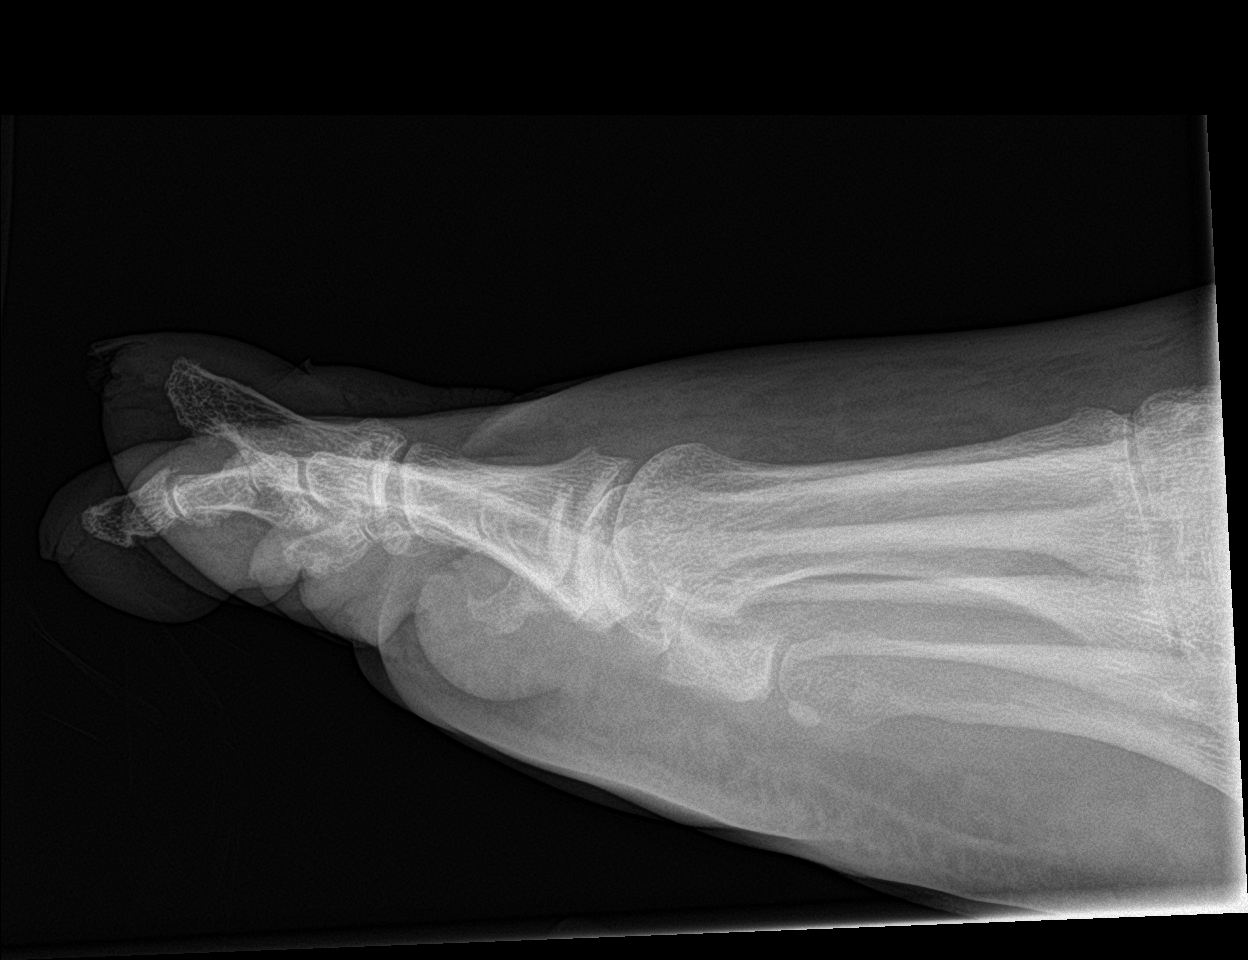

[3 of 3 positions shown; findings below may reference images not displayed]

FINDINGS: No fracture or bone lesion.

Mild asymmetric joint space narrowing at the first
metatarsophalangeal joint. No periarticular erosion. No marginal
osteophytes.

There is nonspecific soft tissue swelling extending from the medial
forefoot across the great toe.
IMPRESSION: 1. No fracture or dislocation.
2. No periarticular erosion. Mild asymmetric joint space narrowing
at the first metatarsophalangeal joint is nonspecific but suggests
mild osteoarthritis. Inflammatory arthropathy is not excluded.

## 2021-05-31 MED ORDER — DICLOFENAC SODIUM 1 % EX GEL
2.0000 g | Freq: Four times a day (QID) | CUTANEOUS | Status: DC
  Administered 2021-05-31 – 2021-06-07 (×21): 2 g via TOPICAL
  Filled 2021-05-31: qty 100

## 2021-05-31 MED ORDER — CLONIDINE HCL 0.1 MG PO TABS
0.1000 mg | ORAL_TABLET | Freq: Three times a day (TID) | ORAL | Status: DC
  Administered 2021-05-31 – 2021-06-04 (×14): 0.1 mg via ORAL
  Filled 2021-05-31 (×16): qty 1

## 2021-05-31 NOTE — Progress Notes (Signed)
Speech Language Pathology Daily Session Note  Patient Details  Name: CODEE TUTSON MRN: [id number] Date of Birth: 1966/06/26  Today's Date: 05/31/2021 SLP Individual Time: 1520-1555 SLP Individual Time Calculation (min): 35 min  Short Term Goals: Week 2: SLP Short Term Goal 1 (Week 2): STG=LTG due to ELOS  Skilled Therapeutic Interventions: Pt seen for skilled ST with focus on swallowing and speech goals. Pt HOB raised as much as possible for PO trials. Pt tolerating cup sips of HTL with independent use of small sips and rate control, no overt s/s aspiration or change in vocal quality with any trials. Pt was independent for use of liquid when experiencing dry mouth during speech tasks which was exacerbating dysarthria and therefore intelligibility. Discussed upcoming MBS before d/c. Pt required mod A verbal cues to utilize breath support and pacing this date, respiratory coordination remains quite impaired for speech. Pt most understood at 1-2 word level and benefits from mod verbal cues to increase awareness of communication break downs. Intelligibility is better during structured language activities, pt often putting finger to lips to pause and take breath during these tasks. Educated on continuing to utilize these strategies at discharge to increase communication of wants/needs/desires with family, Programme researcher, broadcasting/film/video and friends. Pt verbalizing understanding. Pt left in bed with alarm set and all needs within reach. Cont ST POC.  Pain Pain Assessment Pain Scale: 0-10 Pain Score: 0-No pain  Therapy/Group: Individual Therapy  Tacey Ruiz 05/31/2021, 4:02 PM

## 2021-05-31 NOTE — Progress Notes (Signed)
Physical Therapy Session Note  Patient Details  Name: Brandon French MRN: [id number] Date of Birth: 04/30/1966  Today's Date: 05/31/2021 PT Individual Time: 0902-1000 PT Individual Time Calculation (min): 58 min   Short Term Goals: Week 1:  PT Short Term Goal 1 (Week 1): Patient will complete be dmobility with CGA consistently PT Short Term Goal 2 (Week 1): Patient will transfer with LRAD bed <> wc with CGA consistently PT Short Term Goal 3 (Week 1): Patient will ambulate >50 with LRAD with CGA consistently  Skilled Therapeutic Interventions/Progress Updates:  Patient supine in bed on entrance to room. Patient alert and agreeable to PT session.   Patient with pain complaint re: R large toe at 7/10 and L thigh pain rated at 4/10 muscle soreness at start of session. R toe is TTP but with no signs of swelling or redness. Time to locate and request pain medication from RN. BP slightly elevated at 138/ 87.  Therapeutic Activity: Bed Mobility: Patient performed supine --> sit with supervision. No vc/ tc required for technique. Socks doffed and shoes donned with MaxA. Transfers: Patient performed sit<>stand transfers with CGA this session d/t increased toe pain and stand pivot transfers throughout session with CGA/ supervision. Provided verbal cues for safety and positioning. Toilet transfer performed with close supervision and pt initiating clothing mgmt with supervision. Requires maxA for donning new brief and ModA for donning pants.   Therapeutic Exercise: Pt guided in continuous reciprocation of BUE and BLE using NuStep L3 x with focus on slow, continuous movement for pain relief of potential DOMS onset in L quad. Pt starting with fast pace and requiring rest break at . Requested pt to slow pace to 20 steps/ min slower than original pace. Maintains throughout rest of bout. No pain at toe with performance, but pt relates pain with adjustment of pressure on foot.    Patient seated  in  w/c at end of session with brakes locked, belt alarm set, and all needs within reach. Shoes doffed with MaxA and grip socks donned for pt's comfort.      Therapy Documentation Precautions:  Precautions Precautions: Fall Restrictions Weight Bearing Restrictions: No General:   Pain:  7/10 R great toe pain, 4/10 L thigh/ quad pain  Therapy/Group: Individual Therapy  Loel Dubonnet PT, DPT 05/31/2021, 8:00 AM

## 2021-05-31 NOTE — Progress Notes (Signed)
Nutrition Follow-up  DOCUMENTATION CODES:   Morbid obesity  INTERVENTION:  Continue Ensure Enlive po BID (thickened to appropriate consistency), each supplement provides 350 kcal and 20 grams of protein.  Encourage adequate PO intake.   NUTRITION DIAGNOSIS:   Increased nutrient needs related to  (therapy) as evidenced by estimated needs; ongoing  GOAL:   Patient will meet greater than or equal to 90% of their needs; met  MONITOR:   PO intake, Supplement acceptance, Skin, Weight trends, Labs, I & O's  REASON FOR ASSESSMENT:   Consult Assessment of nutrition requirement/status  ASSESSMENT:   55 year old male with history of DM type 2, HTN, HLD and previous CVA. Presented on 05/16/2021 with weakness and slurred speech. 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  Plans for swallow evaluation today with SLP. Pt currently on a dysphagia 1 diet with honey thick liquids. Meal completion has been 100%. Pt has been tolerating his PO diet. Pt with Ensure ordered and has been consuming them. RD to continue with current orders to aid in caloric and protein needs.   Labs and medications reviewed.   Diet Order:   Diet Order             DIET - DYS 1 Room service appropriate? Yes; Fluid consistency: Honey Thick  Diet effective now                   EDUCATION NEEDS:   Not appropriate for education at this time  Skin:  Skin Assessment: Reviewed RN Assessment  Last BM:  10/31  Height:   Ht Readings from Last 1 Encounters:  05/20/21 6' (1.829 m)    Weight:   Wt Readings from Last 1 Encounters:  05/20/21 (!) 147.6 kg    BMI:  Body mass index is 44.13 kg/m.  Estimated Nutritional Needs:   Kcal:  2100-2300  Protein:  105-115 grams  Fluid:  >/= 2 L/day  Corrin Parker, MS, RD, LDN RD pager number/after hours weekend pager number on Am[employer].

## 2021-05-31 NOTE — Progress Notes (Signed)
Physical Therapy Weekly Progress Note  Patient Details  Name: Brandon French MRN: [id number] Date of Birth: 1965/11/11  Beginning of progress report period: May 21, 2021 End of progress report period: May 28, 2021  Patient has met 3 of 3 short term goals.  Pt making appropriate progress towards goals and is on track to meet LTG. He is participating well in sessions and continues to work on improving dysarthria and enunciation of spoken language rather than defaulting to written words to get his point across. He completes bed mobility with supervision/ Mod I, sit<>stand transfers with supervision/ CGA, and stand<>pivot transfers with CGA and bari walker. He is ambulating >116f over indoor and outdoor surfaces with CGA and RW and has shown ability to navigate  four 6-in steps with CGA and 2 hand rails. He continues to be primarily limited by premorbid deconditioning, body habitus, decreased dynamic standing balance, LUE and LLE weakness, and gait impairments.    Patient continues to demonstrate the following deficits muscle weakness, decreased cardiorespiratoy endurance, unbalanced muscle activation, decreased attention to left, and decreased standing balance and decreased balance strategies and therefore will continue to benefit from skilled PT intervention to increase functional independence with mobility.  Patient progressing toward long term goals..  Continue plan of care.  PT Short Term Goals Week 1:  PT Short Term Goal 1 (Week 1): Patient will complete be dmobility with CGA consistently PT Short Term Goal 1 - Progress (Week 1): Met PT Short Term Goal 2 (Week 1): Patient will transfer with LRAD bed <> wc with CGA consistently PT Short Term Goal 2 - Progress (Week 1): Met PT Short Term Goal 3 (Week 1): Patient will ambulate >50 with LRAD with CGA consistently PT Short Term Goal 3 - Progress (Week 1): Met Week 2:  PT Short Term Goal 1 (Week 2): STG = LTG d/t ELOS   Therapy  Documentation Precautions:  Precautions Precautions: Fall Restrictions Weight Bearing Restrictions: No General:   Vital Signs:  Pain: Pain Assessment Pain Scale: 0-10 Pain Score: 0-No pain Faces Pain Scale: Hurts little more Pain Location: Toe (Comment which one) Pain Orientation: Right;Other (Comment) (great toe) Pain Descriptors / Indicators: Aching Pain Frequency: Intermittent Pain Onset: Sudden Pain Intervention(s): Medication (See eMAR) Therapy/Group: Individual Therapy  JAlger SimonsPT, DPT 05/28/2021, 11:22 AM

## 2021-05-31 NOTE — Progress Notes (Signed)
Occupational Therapy Session Note  Patient Details  Name: Brandon French MRN: [id number] Date of Birth: 04/21/1966  Today's Date: 05/31/2021 OT Individual Time: 1122-1200 OT Individual Time Calculation (min): 38 min + 59 min   Short Term Goals: Week 1:  OT Short Term Goal 1 (Week 1): Pt will transfer to toilet wiht CGA OT Short Term Goal 1 - Progress (Week 1): Met OT Short Term Goal 2 (Week 1): Pt will don shirt wiht S OT Short Term Goal 2 - Progress (Week 1): Met OT Short Term Goal 3 (Week 1): Pt will stand to groom to demo improved activity tolernace OT Short Term Goal 3 - Progress (Week 1): Met OT Short Term Goal 4 (Week 1): Pt will thread BLE into pants wiht AE PRN OT Short Term Goal 4 - Progress (Week 1): Not met Week 2:  OT Short Term Goal 1 (Week 2): STG = LTG 2/2 ELOS  Skilled Therapeutic Interventions/Progress Updates:    Session 1 (1122-1200): Pt received seated in w/c, c/o R foot pain, but agreeable to therapy. Session focus on self-care retraining, activity tolerance, LUE NMR in prep for improved ADL/IADL/func mobility performance + decreased caregiver burden.. Doffed/donned hospital gown with min A to untie/tie. Declined further ADL and opting to remain seated due to R foot pain.   Seated at BITS, completed the following with LUE and 4 lb wrist weight:  -Memory, x2 with 100% accuracy.   -Rotating Sequence, 89.29% accuracy, 2 min, 7.15 secs   -Pt able to repeat each word/number with ~75% intelligibility   Pt left seated in w/c with safety belt alarm engaged, call bell in reach, and all immediate needs met.     Session 2 (1301-1400): Pt received seated in recliner, cont to c/o ongoing R foot pain but is premedicated per RN, agreeable to therapy. Session focus on self-care retraining, activity tolerance, AE use, LUE NMR, BLE strengthening in prep for improved ADL/IADL/func mobility performance + decreased caregiver burden. Doffed/donned dirty hospital gown with min A to  untie/tie. Adamantly declined need to toilet. Attempted to stand but unable to power up from w/c surface due to foot pain despite physical assist.   Total A w/c transport to and from gym for time management and energy conservation.  With LUE, pt able to reach across midline to retrieve and match playing cards at head height while seated. Attempted with 4 lb wrist weight, but pt reporting LUE too fatigued to continue with weight. Pt able to say name of each card at ~75% inteligibility.   In prep for improved reacher skills to facilitate LBD, pt practiced picking up and tossing bean bags at targets with use of reacher in RUE. Required increased time to pick up bags and able to hit targets at ~70% accuracy.  Finally, completed 5 min on kinetron at 30 cm/sec to target BLE strengthening.  Pt left seated in w/c with safety belt alarm engaged, call bell in reach, and all immediate needs met.    Therapy Documentation Precautions:  Precautions Precautions: Fall Restrictions Weight Bearing Restrictions: No  Pain: ongoing R foot pain, did not rate    ADL: See Care Tool for more details.   Therapy/Group: Individual Therapy  Rachel A Stevenson MS, OTR/L  05/31/2021, 6:40 AM 

## 2021-05-31 NOTE — Progress Notes (Signed)
Patient ID: Brandon French, male   DOB: [birth year], 55 y.o.   MRN: [id number]  Family education 11/3 1-4 PM

## 2021-05-31 NOTE — Progress Notes (Signed)
PROGRESS NOTE   Subjective/Complaints:  C/o freq voiding with IVF, no pain with urination Had BM ontoilet yesterday  Discussed swallowing, pt indicates he will have a swallow test today  [id number] by communication- but no complaints  Objective:   No results found. Recent Labs    05/30/21 0600  WBC 7.3  HGB 10.6*  HCT 35.4*  PLT 337     Recent Labs    05/30/21 0600  NA 138  K 3.9  CL 105  CO2 28  GLUCOSE 101*  BUN 26*  CREATININE 2.17*  CALCIUM 8.3*    Intake/Output Summary (Last 24 hours) at 05/31/2021 0824 Last data filed at 05/31/2021 0751 Gross per 24 hour  Intake 960 ml  Output 350 ml  Net 610 ml         Physical Exam: Vital Signs Blood pressure (!) 162/90, pulse 69, temperature 97.8 F (36.6 C), resp. rate 18, height 6' (1.829 m), weight (!) 147.6 kg, SpO2 98 %.  General: No acute distress Mood and affect are appropriate Heart: Regular rate and rhythm no rubs murmurs or extra sounds Lungs: Clear to auscultation, breathing unlabored, no rales or wheezes Abdomen: Positive bowel sounds, soft nontender to palpation, nondistended Extremities: No clubbing, cyanosis, or edema Skin: No evidence of breakdown, no evidence of rash  Musculoskeletal:     Cervical back: Normal range of motion and neck supple.     Comments: No edema in extremities No pain to palpation Left mid foot and plantar surface, no skin lesions   Neurological:     Mental Status: He is alert and oriented to person, place, and time.     Comments: Alert Severe dysarthria Able to answer biographic questions.  He was able to follow simple motor commands.  Sensation diminished to light touch LLE Motor: RUE/RLE; 5/5 proximal to distal LUE: 4/5 proximal to distal LLE: 3-/5 proximal to distal - unchanged  Psychiatric:        Mood and Affect: Mood normal.        Behavior: Behavior normal.    Assessment/Plan: 1. Functional  deficits which require 3+ hours per day of interdisciplinary therapy in a comprehensive inpatient rehab setting. Physiatrist is providing close team supervision and 24 hour management of active medical problems listed below. Physiatrist and rehab team continue to assess barriers to discharge/monitor patient progress toward functional and medical goals  Care Tool:  Bathing    Body parts bathed by patient: Right arm, Left arm, Chest, Abdomen, Front perineal area, Right upper leg, Left upper leg, Face, Right lower leg, Left lower leg   Body parts bathed by helper: Buttocks, Right lower leg, Left lower leg     Bathing assist Assist Level: Minimal Assistance - Patient > 75%     Upper Body Dressing/Undressing Upper body dressing   What is the patient wearing?: Pull over shirt    Upper body assist Assist Level: Set up assist    Lower Body Dressing/Undressing Lower body dressing      What is the patient wearing?: Incontinence brief, Pants     Lower body assist Assist for lower body dressing: Minimal Assistance - Patient > 75% (reacher)  Toileting Toileting    Toileting assist Assist for toileting: Maximal Assistance - Patient 25 - 49%     Transfers Chair/bed transfer  Transfers assist  Chair/bed transfer activity did not occur: Refused  Chair/bed transfer assist level: Minimal Assistance - Patient > 75% (stand pivot) Chair/bed transfer assistive device: Armrests, Geologist, engineering   Ambulation assist      Assist level: Minimal Assistance - Patient > 75% Assistive device: Walker-rolling Max distance: 165ft   Walk 10 feet activity   Assist     Assist level: Minimal Assistance - Patient > 75% Assistive device: Walker-rolling   Walk 50 feet activity   Assist Walk 50 feet with 2 turns activity did not occur: Safety/medical concerns         Walk 150 feet activity   Assist Walk 150 feet activity did not occur: Safety/medical  concerns         Walk 10 feet on uneven surface  activity   Assist Walk 10 feet on uneven surfaces activity did not occur: Safety/medical concerns         Wheelchair     Assist Is the patient using a wheelchair?: No             Wheelchair 50 feet with 2 turns activity    Assist            Wheelchair 150 feet activity     Assist          Blood pressure (!) 162/90, pulse 69, temperature 97.8 F (36.6 C), resp. rate 18, height 6' (1.829 m), weight (!) 147.6 kg, SpO2 98 %.    Medical Problem List and Plan: 1.  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            con't PT, OT and SLP- team conf in am  2.  Impaired mobility: continue Lovenox. Dopplers reviewed and negative for clots.              -antiplatelet therapy: DAPT x1 month followed by Plavix alone. 3. Pain Management: Tylenol as needed.Thalamic pain , LLE start gabapentin 100mg  Qhs may need to titrate up 4. Mood: LCSW to follow for evaluation and support.             -antipsychotic agents: N/A 5. Neuropsych: This patient is not capable of making decisions on his own behalf. 6. Skin/Wound Care: Routine pressure-relief measures. 7. Fluids/Electrolytes/Nutrition: Will monitor intake/output.              CMP ordered for tomorrow AM             -- Will need assistance/encouragement for intake. 8.  HTN: Monitor blood pressures 3 times daily 5-7 days.              -was on Toprol XL 100mg  , Clonidine 0.1mg  BID PTA, will resume clonidine             Monitor with increased exertion  -increase amlodipine to 10mg    Discontinue hydralazine 10mg  TID, patient back on clonidine today Vitals:   05/30/21 1917 05/31/21 0447  BP: (!) 153/83 (!) 162/90  Pulse: (!)  56 69  Resp:  18  Temp:  97.8 F (36.6 C)  SpO2: 96% 98%  Elevated 11/1 increased clonidine to TID- monitor responseContinue metoprolol tartrate 12.5mg  BID,       will need to monitor for bradycardia.    9.  T2DM with hyperglycemia: Hgb A1C- 6.6.  Was on metformin prior to admission which is being held due to CKD?             -- Monitor blood sugars achs and use SSI for elevated BS         CBG (last 3)  Recent Labs    05/30/21 1653 05/30/21 2047 05/31/21 0546  GLUCAP 126* 169* 106*   11/1 controlled   10.  CKD stage IIIb: Serum creatinine 1.92 range per records., 2.17 on 10/31 slightly above baseline              -- cont IVF to 2mls/hr for 12 hours - will cont tonight but will need to d/c prior to discharge  Change to .45 NS ,K+ 3.6 on 10/28  11.  Possible HCOM: Severe asymmetric LV septal hypertrophy per echo             -- Will need referral to cardiology at discharge 12. 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 Will need assistance with fluid intake.  Repeat swallow eval prior to discharge  13. Dyslipidemia: LDL 139. On Crestor.  14. Morbid obesity: Educate on importance of diet/wt loss to promote health and mobility.   15.BPH?: Indicating difficulty with voiding--will resume Proscar and Flomax.  --toilet every 3-4 hours/assist with urinal 16. Assisted, witnessed fall: patient assessed and without any negative consequences of fall.  17. Bradycardia: monitor on BB on reduced dose compared to home dose we will place parameters  10/30- holding B blocker sometimes due to parameters/low HR- con't regimen   LOS: 11 days A FACE TO FACE EVALUATION WAS PERFORMED  Charlett Blake 05/31/2021, 8:24 AM

## 2021-06-01 LAB — GLUCOSE, CAPILLARY
Glucose-Capillary: 152 mg/dL — ABNORMAL HIGH (ref 70–99)
Glucose-Capillary: 139 mg/dL — ABNORMAL HIGH (ref 70–99)
Glucose-Capillary: 114 mg/dL — ABNORMAL HIGH (ref 70–99)
Glucose-Capillary: 215 mg/dL — ABNORMAL HIGH (ref 70–99)

## 2021-06-01 MED ORDER — PREDNISONE 20 MG PO TABS
40.0000 mg | ORAL_TABLET | Freq: Every day | ORAL | Status: DC
  Administered 2021-06-01 – 2021-06-02 (×2): 40 mg via ORAL
  Filled 2021-06-01 (×2): qty 2

## 2021-06-01 MED ORDER — PREDNISONE 20 MG PO TABS: 40.0000 mg | ORAL_TABLET | Freq: Every day | ORAL | Status: DC

## 2021-06-01 NOTE — Progress Notes (Signed)
Occupational Therapy Session Note  Patient Details  Name: Brandon French MRN: [id number] Date of Birth: 1965-08-09  Today's Date: 06/01/2021 OT Individual Time: 1125-1201 OT Individual Time Calculation (min): 36 min + 53 min   Short Term Goals: Week 1:  OT Short Term Goal 1 (Week 1): Pt will transfer to toilet wiht CGA OT Short Term Goal 1 - Progress (Week 1): Met OT Short Term Goal 2 (Week 1): Pt will don shirt wiht S OT Short Term Goal 2 - Progress (Week 1): Met OT Short Term Goal 3 (Week 1): Pt will stand to groom to demo improved activity tolernace OT Short Term Goal 3 - Progress (Week 1): Met OT Short Term Goal 4 (Week 1): Pt will thread BLE into pants wiht AE PRN OT Short Term Goal 4 - Progress (Week 1): Not met Week 2:  OT Short Term Goal 1 (Week 2): STG = LTG 2/2 ELOS  Skilled Therapeutic Interventions/Progress Updates:    Session 1 ([id number]): Pt received in hallway with NT, in route to new room assignment, agreeable to therapy. Session focus on self-care retraining, activity tolerance, transfer transfer, problem solving in prep for improved ADL/IADL/func mobility performance + decreased caregiver burden. Total A w/c transport to gym for time management and energy conservation. Reports ongoing R big toe pain, but reports having received tylenol this am.   Stood for 8 min at high low table with min A to power up/facilitate forward trunk flexion. Reports primarily avoiding bearing weight through R toe. Able to maintain standing with S and RUE support on table. Copied 2 level 3 difficulty designs with lego blocks with min A for 100% accuracy + increased time. Able to retrieve blocks from outside BOS without LOB. Pt reported incontinent void of urine, improved awareness compared to previous sessions with pt with poor awareness.  In room, stood at sink with min A to power up. Able to complete pericare with S, max A to doff/don new brief, and min A to pull up pants. Reports knowing when  he has to go, but due to "IV fluids" having sudden urgency. Reccommended to cont to wear briefs / timed toileting upon DC to manage incontinence .  Pt left seated in w/c with call bell in reach, and all immediate needs met. No alarm box in room, pt educated to call for assist and pt verbalized understanding. RN updated.    Session 2 [phone]): Pt received seated in w/c, cont to c/o ongoing R big toe pain, but did not rate, agreeable to therapy. Session focus on self-care retraining, activity tolerance, functional mobility in prep for improved ADL/IADL/func mobility performance + decreased caregiver burden. Came into standing with min A and increased time to power up, tactile cues to facilitate forward trunk flexion. Ambulatory transfer > TTB with CGA and RW. Bathed UB with set-up A, required assist to pick up soap dropped on floor. Bathed LB with min A to bathe buttocks in standing. Utilized LH sponge to reach feet. Ambulatory transfer > w/c at sink in similar manner. Completed UB dressing with min A to tie hospital gown in back. Donned brief/pants with mod A to thread BLE due to no reacher being available. Completed grooming seated mod I. Set-up suction in room, cont to require intermittent cues to utilize suction for oral secretion management.  Pt left seated in w/c with call bell in reach, and all immediate needs met.    Therapy Documentation Precautions:  Precautions Precautions: Fall Restrictions Weight Bearing Restrictions: No  Pain:   See session notes ADL: See Care Tool for more details.   Therapy/Group: Individual Therapy  Volanda Napoleon MS, OTR/L  06/01/2021, 6:36 AM

## 2021-06-01 NOTE — Progress Notes (Signed)
Patient ID: Brandon French, male   DOB: 1965/08/26, 55 y.o.   MRN: [id number]  Social worker made attempt to reach family to provide conference updates. Unable to leave VM.  [place], [place] [phone]

## 2021-06-01 NOTE — Progress Notes (Signed)
Patient ID: Brandon French, male   DOB: 1966-04-03, 55 y.o.   MRN: [id number]  New Patient Visit with Heather Roberts Tuesday July 19, 2021 3:20 PM    [facility] [address] [place] [address] [phone]  Lavera Guise, [place] [phone]

## 2021-06-01 NOTE — Progress Notes (Signed)
Patient ID: Brandon French, male   DOB: 08/16/1965, 55 y.o.   MRN: [id number]  Patient approved by [facility]

## 2021-06-01 NOTE — Plan of Care (Signed)
  Problem: Sit to Stand Goal: LTG:  Patient will perform sit to stand with assistance level (PT) Description: LTG:  Patient will perform sit to stand with assistance level (PT) Flowsheets (Taken 06/01/2021 1012) LTG: PT will perform sit to stand in preparation for functional mobility with assistance level: Contact Guard/Touching assist Note: Downgraded due to slow progress and pain    Problem: RH Bed to Chair Transfers Goal: LTG Patient will perform bed/chair transfers w/assist (PT) Description: LTG: Patient will perform bed to chair transfers with assistance (PT). Flowsheets (Taken 06/01/2021 1012) LTG: Pt will perform Bed to Chair Transfers with assistance level: Contact Guard/Touching assist   Problem: RH Car Transfers Goal: LTG Patient will perform car transfers with assist (PT) Description: LTG: Patient will perform car transfers with assistance (PT). Flowsheets (Taken 06/01/2021 1012) LTG: Pt will perform car transfers with assist:: Contact Guard/Touching assist   Problem: RH Ambulation Goal: LTG Patient will ambulate in controlled environment (PT) Description: LTG: Patient will ambulate in a controlled environment, # of feet with assistance (PT). Flowsheets Taken 06/01/2021 1012 by Golden Pop, PT LTG: Pt will ambulate in controlled environ  assist needed:: Contact Guard/Touching assist Taken 05/21/2021 1543 by Westley Foots, PT LTG: Ambulation distance in controlled environment: 150 Note: Downgraded due to slow progress and pain  Goal: LTG Patient will ambulate in home environment (PT) Description: LTG: Patient will ambulate in home environment, # of feet with assistance (PT). Flowsheets (Taken 06/01/2021 1012) LTG: Pt will ambulate in home environ  assist needed:: Contact Guard/Touching assist LTG: Ambulation distance in home environment: 50ft with LRAD Note: Downgraded due to slow progress and pain    Problem: RH Stairs Goal: LTG Patient will ambulate up and down  stairs w/assist (PT) Description: LTG: Patient will ambulate up and down # of stairs with assistance (PT) Flowsheets (Taken 06/01/2021 1012) LTG: Pt will ambulate up/down stairs assist needed:: Contact Guard/Touching assist LTG: Pt will  ambulate up and down number of stairs: 3 steps with BUE support Note: Downgraded due to slow progress and pain

## 2021-06-01 NOTE — Progress Notes (Signed)
Patient ID: Brandon French, male   DOB: [birth year], 55 y.o.   MRN: [id number]  Patient [facility] referral sent to [facility].

## 2021-06-01 NOTE — Progress Notes (Signed)
Physical Therapy Session Note  Patient Details  Name: Brandon French MRN: [id number] Date of Birth: 24-Feb-1966  Today's Date: 06/01/2021 PT Individual Time: 0806-0900 and [id number] PT Individual Time Calculation (min): 54 min and 53 min   Short Term Goals:  Week 2:  PT Short Term Goal 1 (Week 2): STG = LTG d/t ELOS   Skilled Therapeutic Interventions/Progress Updates:  Session 1.  Pain: 7-8 R great toe. Sharp. Pt repositioned.   Pt received supine in bed and agreeable to PT. Supine>sit transfer with supervision assist and cues for use of bed rail as needed.   Lower body dressing from EOB with mod assist for pants management. Min=mod assist to stand from EOB due to great pain toe on the RLE limiting WB through sound LE.   Transfers completed with min assist throughout session with min-mod cues for improved use of LUE to push into standing due to pain throughout session. RW to perform stand pivot to [facility] throughout session    WC mobility with supervision assist x 160f with cues for improved symmetry on the LUE.   UBE for min forward/4 min revrese with therapeutic rest break between bouts on random resistance. Cues for consistent RPM throughout  Gait with RW and min assist x 213f Pt unable to perform greater than [id number] tp RLE pain as listed above.   Patient returned to room and [facility] reach and all needs met.     Session 2.  Pain: denies at rest. 6-7 with weight bearing. PT repositioned at end of session with BLE elevated in bed.   Pt received sitting in WC and agreeable to PT. Gait training in rehab gym in RW x [id number] min assist. Pain limiting increased distance at this time.   Transfer training including sit<>stand and stand pivot with RW and min assist throughout session with cues for UE placement and anterior weightshift. WC mobility with min assist from PT due to poor traction on floor from R wheel. Lower body dressing with mod assist to  doff/don brief. Bed mobility with supervision assist and heavy use of bed rails    Pt left supine in bed with call bell in reach and all needs met.       Therapy Documentation Precautions:  Precautions Precautions: Fall Restrictions Weight Bearing Restrictions: No    Therapy/Group: Individual Therapy  AustLorie Phenix2/2022, 9:43 AM

## 2021-06-01 NOTE — Progress Notes (Signed)
PROGRESS NOTE   Subjective/Complaints:  Great toe pain on right side  [id number] by communication- but no complaints  Objective:   DG Toe Great Right  Result Date: 05/31/2021 CLINICAL DATA:  Right great toe pain. EXAM: RIGHT GREAT TOE COMPARISON:  None. FINDINGS: No fracture or bone lesion. Mild asymmetric joint space narrowing at the first metatarsophalangeal joint. No periarticular erosion. No marginal osteophytes. There is nonspecific soft tissue swelling extending from the medial forefoot across the great toe. IMPRESSION: 1. No fracture or dislocation. 2. No periarticular erosion. Mild asymmetric joint space narrowing at the first metatarsophalangeal joint is nonspecific but suggests mild osteoarthritis. Inflammatory arthropathy is not excluded. Electronically Signed   By: Lajean Manes M.D.   On: 05/31/2021 15:20   Recent Labs    05/30/21 0600  WBC 7.3  HGB 10.6*  HCT 35.4*  PLT 337     Recent Labs    05/30/21 0600  NA 138  K 3.9  CL 105  CO2 28  GLUCOSE 101*  BUN 26*  CREATININE 2.17*  CALCIUM 8.3*     Intake/Output Summary (Last 24 hours) at 06/01/2021 0902 Last data filed at 06/01/2021 0759 Gross per 24 hour  Intake 598 ml  Output 500 ml  Net 98 ml         Physical Exam: Vital Signs Blood pressure (!) 160/80, pulse 60, temperature 97.8 F (36.6 C), resp. rate 20, height 6' (1.829 m), weight (!) 147.6 kg, SpO2 97 %.  General: No acute distress Mood and affect are appropriate Heart: Regular rate and rhythm no rubs murmurs or extra sounds Lungs: Clear to auscultation, breathing unlabored, no rales or wheezes Abdomen: Positive bowel sounds, soft nontender to palpation, nondistended Extremities: No clubbing, cyanosis, or edema Skin: No evidence of breakdown, no evidence of rash  Musculoskeletal:     Cervical back: Normal range of motion and neck supple.     Comments: No edema in extremities No  pain to palpation Left mid foot and plantar surface, no skin lesions   Neurological:     Mental Status: He is alert and oriented to person, place, and time.     Comments: Alert Severe dysarthria Able to answer biographic questions.  He was able to follow simple motor commands.  Sensation diminished to light touch LLE Motor: RUE/RLE; 5/5 proximal to distal LUE: 4/5 proximal to distal LLE: 3-/5 proximal to distal - unchanged  Psychiatric:        Mood and Affect: Mood normal.        Behavior: Behavior normal.    Assessment/Plan: 1. Functional deficits which require 3+ hours per day of interdisciplinary therapy in a comprehensive inpatient rehab setting. Physiatrist is providing close team supervision and 24 hour management of active medical problems listed below. Physiatrist and rehab team continue to assess barriers to discharge/monitor patient progress toward functional and medical goals  Care Tool:  Bathing    Body parts bathed by patient: Right arm, Left arm, Chest, Abdomen, Front perineal area, Right upper leg, Left upper leg, Face, Right lower leg, Left lower leg   Body parts bathed by helper: Buttocks, Right lower leg, Left lower leg  Bathing assist Assist Level: Minimal Assistance - Patient > 75%     Upper Body Dressing/Undressing Upper body dressing   What is the patient wearing?: Pull over shirt    Upper body assist Assist Level: Set up assist    Lower Body Dressing/Undressing Lower body dressing      What is the patient wearing?: Incontinence brief, Pants     Lower body assist Assist for lower body dressing: Minimal Assistance - Patient > 75% (reacher)     Toileting Toileting    Toileting assist Assist for toileting: Maximal Assistance - Patient 25 - 49%     Transfers Chair/bed transfer  Transfers assist  Chair/bed transfer activity did not occur: Refused  Chair/bed transfer assist level: Minimal Assistance - Patient > 75% (stand  pivot) Chair/bed transfer assistive device: Armrests, Programmer, multimedia   Ambulation assist      Assist level: Minimal Assistance - Patient > 75% Assistive device: Walker-rolling Max distance: 164ft   Walk 10 feet activity   Assist     Assist level: Minimal Assistance - Patient > 75% Assistive device: Walker-rolling   Walk 50 feet activity   Assist Walk 50 feet with 2 turns activity did not occur: Safety/medical concerns         Walk 150 feet activity   Assist Walk 150 feet activity did not occur: Safety/medical concerns         Walk 10 feet on uneven surface  activity   Assist Walk 10 feet on uneven surfaces activity did not occur: Safety/medical concerns         Wheelchair     Assist Is the patient using a wheelchair?: No             Wheelchair 50 feet with 2 turns activity    Assist            Wheelchair 150 feet activity     Assist          Blood pressure (!) 160/80, pulse 60, temperature 97.8 F (36.6 C), resp. rate 20, height 6' (1.829 m), weight (!) 147.6 kg, SpO2 97 %.    Medical Problem List and Plan: 1.  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            con't PT, OT and SLP- team conf in am  2.  Impaired mobility: continue Lovenox. Dopplers reviewed and negative for clots.              -antiplatelet therapy: DAPT x1 month followed by Plavix alone.11/17 3. Pain Management: Tylenol as needed.Thalamic pain , LLE start gabapentin 100mg  Qhs may need to titrate up 4. Mood: LCSW to follow for evaluation and support.             -antipsychotic agents: N/A 5. Neuropsych: This patient is not capable of making decisions on his own behalf. 6. Skin/Wound Care:  Routine pressure-relief measures. 7. Fluids/Electrolytes/Nutrition: Will monitor intake/output.              CMP ordered for tomorrow AM             -- Will need assistance/encouragement for intake. 8.  HTN: Monitor blood pressures 3 times daily 5-7 days.              -was on Toprol XL 100mg  , Clonidine 0.1mg  BID PTA, will resume clonidine             Monitor with increased exertion  -increase amlodipine to 10mg    Discontinue hydralazine 10mg  TID, patient back on clonidine today Vitals:   05/31/21 1934 06/01/21 0407  BP: 138/73 (!) 160/80  Pulse: 61 60  Resp: 20 20  Temp: 97.9 F (36.6 C) 97.8 F (36.6 C)  SpO2: 90% 97%  Elevated 11/1 increased clonidine to TID- monitor response, first full day at this dose Continue metoprolol tartrate 12.5mg  BID,       will need to monitor for bradycardia.    9.  T2DM with hyperglycemia: Hgb A1C- 6.6.  Was on metformin prior to admission which is being held due to CKD?             -- Monitor blood sugars achs and use SSI for elevated BS         CBG (last 3)  Recent Labs    05/31/21 1634 05/31/21 2124 06/01/21 0617  GLUCAP 100* 172* 152*   11/2 controlled   10.  CKD stage IIIb: Serum creatinine 1.92 range per records., 2.17 on 10/31 slightly above baseline              -- d/c IVF Recheck BMET in am  11.  Possible HCOM: Severe asymmetric LV septal hypertrophy per echo             -- Will need referral to cardiology at discharge 12. 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 Will need assistance with fluid intake.  Repeat swallow eval prior to discharge  13. Dyslipidemia: LDL 139. On Crestor.  14. Morbid obesity: Educate on importance of diet/wt loss to promote health and mobility.   15.BPH?: Indicating difficulty with voiding--will resume Proscar and Flomax.  --toilet every 3-4 hours/assist with urinal 16. Assisted, witnessed fall: patient assessed and without any negative consequences of fall.   17. Bradycardia: monitor on BB on reduced dose compared to home dose we will place parameters  10/30- holding B blocker sometimes due to parameters/low HR- con't regimen  18.  Right great toe pain xray shows mild degenerative changes, serum urate is normal , clinically c/w gout, has had episodes at home in the past , will start prednisone 40mg  daily , monitor CBG  LOS: 12 days A FACE TO FACE EVALUATION WAS PERFORMED  13/02/22 06/01/2021, 9:02 AM

## 2021-06-01 NOTE — Progress Notes (Signed)
Patient ID: Brandon French, male   DOB: 1966-05-22, 55 y.o.   MRN: [id number]  Patient DME ordered through [employer]. Bari 3N1, TTB, RW and suction.  [place], [place] [phone]

## 2021-06-01 NOTE — Patient Care Conference (Signed)
Inpatient RehabilitationTeam Conference and Plan of Care Update Date: 06/01/2021   Time: 10:07 AM    Patient Name: Brandon French      Medical Record Number: [id number]  Date of Birth: 04/26/1966 Sex: Male         Room/Bed: 4M09C/4M09C-01 Payor Info: Payor: [employer] MEDICARE / Plan: [employer] MEDICARE HMO / Product Type: *No Product type* /    Admit Date/Time:  05/20/2021  3:03 PM  Primary Diagnosis:  CVA (cerebral vascular accident) [facility])  Hospital Problems: Principal Problem:   CVA (cerebral vascular accident) [facility])    Expected Discharge Date: Expected Discharge Date: 06/06/21  Team Members Present: Physician leading conference: Dr. Claudette Laws Social Worker Present: Lavera Guise, BSW Nurse Present: Chana Bode, RN PT Present: Grier Rocher, PT OT Present: Annye English, OT SLP Present: Eilene Ghazi, SLP PPS Coordinator present : Fae Pippin, SLP     Current Status/Progress Goal Weekly Team Focus  Bowel/Bladder   inc of b and b lbm 10/31  beocme cont of b and b  timed toilette, assess q shift and prn   Swallow/Nutrition/ Hydration   Dys 1, honey thick liquids sup-to-min A for swallow safety  sup A  Tolerance of Dys 1, honey thick liquids. Repeat MBS 11/3   ADL's   mod I for seated oral care/grooming/UBD/UB bathing; CGA ambulatory bathroom transfers with RW; min A LB dressing with use of reacher/sock aid; mod to max toileting tasks due to incontinence  S ADL and transfers  LUE NMR, balance/self-care/transfer retraining, pt/family education, DME/AE use   Mobility   severe dysarthria; Bed mobility = Mod I; sit<> stand = CGA using RW, stand pivot = CGA/ supervision using RW; ambulation = up to 128ft using RW with CGA/ superivsion, 4 stairs using B HRs with CGA/ supervision  All functional mobility at overall supervision level; ambulation with RW  FAMILY EDUCATION, continue incorporating increased verbal articulation into sessions, L NMR, standing balance,  improved LOA with transfers, insight into compensations he's learned from previous stroke for mobility, safety awareness   Communication   mod A  mod A  implementation of speech intelligiblity strategies   Safety/Cognition/ Behavioral Observations            Pain   no current c/o pain         Skin   no current skin break down           Discharge Planning:  Patient plans to discharge home with parents (in late 62s), able to provide supervision   Team Discussion: Right great toe pain; treated for gout with prednisone. Progress limited by pain and antalgic gait pattern.  BP labile with poor intake;IVF at HS stopped per MD and monitoring labs.  Patient incontinent with urgency.  Patient on target to meet rehab goals: yes, currently mod I for grooming and upper body bathing and dressing. Needs mod assist for lower body care and mod - max assist for toileting.  CGA - Supervision with a RW last week although today min - mod assist due to pain. Goals for discharge set for supervision level overall.  *See Care Plan and progress notes for long and short-term goals.   Revisions to Treatment Plan:  Upgraded ADL goals Downgraded toileting goals Downgraded communication goals MBS 06/02/21; anticipate change in diet for discharge   Teaching Needs:  Safety, cues for recall and speech intelligibility and breath support, medications, secondary risk modification and dietary modifications, etc  Current Barriers to Discharge: Decreased caregiver support, Home  enviroment access/layout, Incontinence, and Nutritional means  Possible Resolutions to Barriers: Family education with brother and parents     Medical Summary Current Status: RIght toe pain, w/u and tx in progress, HTN pooly controlled , DM controlled  Barriers to Discharge: Medical stability   Possible Resolutions to [employer] Focus: medication adjustment for BP, trial off IVF, prednisone for toe pain   Continued Need for Acute  Rehabilitation Level of Care: The patient requires daily medical management by a physician with specialized training in physical medicine and rehabilitation for the following reasons: Direction of a multidisciplinary physical rehabilitation program to maximize functional independence : Yes Medical management of patient stability for increased activity during participation in an intensive rehabilitation regime.: Yes Analysis of laboratory values and/or radiology reports with any subsequent need for medication adjustment and/or medical intervention. : Yes   I attest that I was present, lead the team conference, and concur with the assessment and plan of the team.   Chana Bode B 06/01/2021, 1:37 PM

## 2021-06-02 ENCOUNTER — Inpatient Hospital Stay (HOSPITAL_COMMUNITY): Payer: Medicare HMO

## 2021-06-02 LAB — BASIC METABOLIC PANEL
Anion gap: 5 (ref 5–15)
BUN: 29 mg/dL — ABNORMAL HIGH (ref 6–20)
CO2: 27 mmol/L (ref 22–32)
Calcium: 8.6 mg/dL — ABNORMAL LOW (ref 8.9–10.3)
Chloride: 105 mmol/L (ref 98–111)
Creatinine, Ser: 2.2 mg/dL — ABNORMAL HIGH (ref 0.61–1.24)
GFR, Estimated: 35 mL/min — ABNORMAL LOW (ref >60)
Glucose, Bld: 142 mg/dL — ABNORMAL HIGH (ref 70–99)
Potassium: 4.2 mmol/L (ref 3.5–5.1)
Sodium: 137 mmol/L (ref 135–145)

## 2021-06-02 LAB — GLUCOSE, CAPILLARY
Glucose-Capillary: 138 mg/dL — ABNORMAL HIGH (ref 70–99)
Glucose-Capillary: 152 mg/dL — ABNORMAL HIGH (ref 70–99)
Glucose-Capillary: 184 mg/dL — ABNORMAL HIGH (ref 70–99)
Glucose-Capillary: 229 mg/dL — ABNORMAL HIGH (ref 70–99)

## 2021-06-02 NOTE — Progress Notes (Signed)
Speech Language Pathology Weekly Progress and Session Note  Patient Details  Name: Brandon French MRN: [id number] Date of Birth: 05-16-1966  Beginning of progress report period: May 27, 2021 End of progress report period: June 02, 2021  Today's Date: 06/02/2021 SLP Individual Time: 1500-1530 SLP Individual Time Calculation (min): 30 min  Short Term Goals: Week 2: SLP Short Term Goal 1 (Week 2): STG=LTG due to ELOS SLP Short Term Goal 1 - Progress (Week 2): Progressing toward goal  New Short Term Goals: Week 3: SLP Short Term Goal 1 (Week 3): STG=LTG due to ELOS  Weekly Progress Updates: Patient continues to make functional progress with ST over the past week and is progressing toward long-term goals set at supervision assist for swallowing, and moderate assist for speech intelligibility. Pt is currently at goal level and plan to continue for generalization. Patient is currently completing speech tasks with mod A verbal to implement speech intelligibility strategies at word and phrase level to achieve ~50-75% intelligibility. Patient continues to benefit from consistent reminders to increase breath support prior to speaking. Pt had repeat MBS today which revealed improved oral phase though pharyngeal phase not improved enough to allow for liquid upgrade beyond honey thick liquids. Pt recommended to continue dysphagia 1 diet and honey thick liquids. Pt now safe to consume HTL by cup and no longer requires spoon sips. Continue crushed meds. Patient/family education is ongoing. Recommend continued skilled ST intervention to maximize communication and swallowing function and functional independence prior to discharge.    Intensity: Minumum of 1-2 x/day, 30 to 90 minutes Frequency: 3 to 5 out of 7 days Duration/Length of Stay: 11/7 Treatment/Interventions: Financial trader;Dysphagia/aspiration precaution training;Speech/Language facilitation;Internal/external aids;Multimodal communication  approach;Environmental controls   Daily Session Skilled Therapeutic Interventions: Pt received upright in wheelchair and agreeable to skilled ST intervention with focus on education. Pt's family was not present for scheduled family education thus education provided to pt this date. SLP educated on MBS results and diet recommendations for dysphagia 1 (pureed) diet and honey thick liquids by cup. Pt no longer required to consume HTL by spoon as before. SLP notified nurse on MBS updates. Also reinforced safe swallowing precautions and provided extensive education on thickening liquids, obtaining pureed textures, and foods to avoid. SLP also reinforced communication strategies to enhance speech intelligibility. All education topics discussed today were accompanied by handouts which were placed in patient's stroke binder to provide to his family. Patient was left in chair with alarm activated and immediate needs within reach at end of session. Continue per current plan of care.     General    Pain Pain Assessment Pain Scale: 0-10 Pain Score: 0-No pain  Therapy/Group: Individual Therapy  Tamala Ser 06/02/2021, 4:11 PM

## 2021-06-02 NOTE — Progress Notes (Signed)
Modified Barium Swallow Progress Note  Patient Details  Name: Brandon French MRN: [id number] Date of Birth: Jul 10, 1966  Today's Date: 06/02/2021  Modified Barium Swallow completed.  Full report located under Chart Review in the Imaging Section.  Brief recommendations include the following:  Clinical Impression  Patient presents with a mild-moderate oral and a mod-severe pharyngeal phase dysphagia. Although his oral phase has improved as compared to previous MBS on 10/19, his pharyngeal phase has not improved enough to allow for upgrade of liquid consistency beyond honey thick. During oral phase, patient exhibited prolonged mastication and piecemeal swallowing with regular solids and weak lingual manipulation and mild anterior to posterior transit delays with honey thick, nectar thick, thin liquids and puree solids. Swallow initiation was delayed to level of vallecular sinus with regular solids, puree solids and honey thick liquids. Timing of swallow initiation was inconsistent with thin liquids and nectar thick liquids (via cup or spoon) and if patient initiated swallow at level of vallecular sinus, he did not exhibit any penetration or aspiration, however when he did not initiate swallow until level of pyriform sinus, then he would exhibit silent penetration and aspiration of thin liquids and nectar thick liquids. No significant amount of pharyngeal residuals were observed post initial swallows with any of the texted bolus consistencies. Chin tuck strategy resulted in aspiraiton with nectar thick liquids. No penetration or aspiration was obseved with cup sips or teaspoon sips of honey thick liquids. SLP is recommending to continue with Dys 1 solids, honey thick liquids, but allow patient to have honey thick liquids via cup sips instead of teaspoon sips as had been previous recommendation.   Swallow Evaluation Recommendations       SLP Diet Recommendations: Dysphagia 1 (Puree) solids;Honey thick  liquids   Liquid Administration via: Cup;Spoon   Medication Administration: Crushed with puree   Supervision: Patient able to self feed;Full supervision/cueing for compensatory strategies   Compensations: Minimize environmental distractions;Slow rate;Small sips/bites   Postural Changes: Seated upright at 90 degrees   Oral Care Recommendations: Other (Comment) (oral care after PO's)        Angela Nevin, MA, CCC-SLP Speech Therapy

## 2021-06-02 NOTE — Progress Notes (Signed)
PROGRESS NOTE   Subjective/Complaints: Right big toe less painful, discussed BMET, as well as MBS today  [id number] by communication- but no complaints  Objective:   DG Toe Great Right  Result Date: 05/31/2021 CLINICAL DATA:  Right great toe pain. EXAM: RIGHT GREAT TOE COMPARISON:  None. FINDINGS: No fracture or bone lesion. Mild asymmetric joint space narrowing at the first metatarsophalangeal joint. No periarticular erosion. No marginal osteophytes. There is nonspecific soft tissue swelling extending from the medial forefoot across the great toe. IMPRESSION: 1. No fracture or dislocation. 2. No periarticular erosion. Mild asymmetric joint space narrowing at the first metatarsophalangeal joint is nonspecific but suggests mild osteoarthritis. Inflammatory arthropathy is not excluded. Electronically Signed   By: Lajean Manes M.D.   On: 05/31/2021 15:20   No results for input(s): WBC, HGB, HCT, PLT in the last 72 hours.   Recent Labs    06/02/21 0523  NA 137  K 4.2  CL 105  CO2 27  GLUCOSE 142*  BUN 29*  CREATININE 2.20*  CALCIUM 8.6*     Intake/Output Summary (Last 24 hours) at 06/02/2021 0727 Last data filed at 06/02/2021 0500 Gross per 24 hour  Intake 418 ml  Output 275 ml  Net 143 ml         Physical Exam: Vital Signs Blood pressure (!) 142/68, pulse (!) 50, temperature 97.9 F (36.6 C), temperature source Oral, resp. rate 16, height 6' (1.829 m), weight (!) 147.6 kg, SpO2 94 %.  General: No acute distress Mood and affect are appropriate Heart: Regular rate and rhythm no rubs murmurs or extra sounds Lungs: Clear to auscultation, breathing unlabored, no rales or wheezes Abdomen: Positive bowel sounds, soft nontender to palpation, nondistended Extremities: No clubbing, cyanosis, or edema Skin: No evidence of breakdown, no evidence of rash  Musculoskeletal:     Cervical back: Normal range of motion and neck  supple.     Comments: No edema in extremities No pain to palpation Left mid foot and plantar surface, no skin lesions   Neurological:     Mental Status: He is alert and oriented to person, place, and time.     Comments: Alert Severe dysarthria Able to answer biographic questions.  He was able to follow simple motor commands.  Sensation diminished to light touch LLE Motor: RUE/RLE; 5/5 proximal to distal LUE: 4/5 proximal to distal LLE: 3-/5 proximal to distal - unchanged  Psychiatric:        Mood and Affect: Mood normal.        Behavior: Behavior normal.    Assessment/Plan: 1. Functional deficits which require 3+ hours per day of interdisciplinary therapy in a comprehensive inpatient rehab setting. Physiatrist is providing close team supervision and 24 hour management of active medical problems listed below. Physiatrist and rehab team continue to assess barriers to discharge/monitor patient progress toward functional and medical goals  Care Tool:  Bathing    Body parts bathed by patient: Right arm, Left arm, Chest, Abdomen, Front perineal area, Right upper leg, Left upper leg, Face, Right lower leg, Left lower leg   Body parts bathed by helper: Buttocks, Right lower leg, Left lower leg  Bathing assist Assist Level: Minimal Assistance - Patient > 75%     Upper Body Dressing/Undressing Upper body dressing   What is the patient wearing?: Pull over shirt    Upper body assist Assist Level: Set up assist    Lower Body Dressing/Undressing Lower body dressing      What is the patient wearing?: Incontinence brief, Pants     Lower body assist Assist for lower body dressing: Minimal Assistance - Patient > 75% (reacher)     Toileting Toileting    Toileting assist Assist for toileting: Maximal Assistance - Patient 25 - 49%     Transfers Chair/bed transfer  Transfers assist  Chair/bed transfer activity did not occur: Refused  Chair/bed transfer assist level:  Minimal Assistance - Patient > 75% (stand pivot) Chair/bed transfer assistive device: Armrests, Programmer, multimedia   Ambulation assist      Assist level: Minimal Assistance - Patient > 75% Assistive device: Walker-rolling Max distance: 153ft   Walk 10 feet activity   Assist     Assist level: Minimal Assistance - Patient > 75% Assistive device: Walker-rolling   Walk 50 feet activity   Assist Walk 50 feet with 2 turns activity did not occur: Safety/medical concerns         Walk 150 feet activity   Assist Walk 150 feet activity did not occur: Safety/medical concerns         Walk 10 feet on uneven surface  activity   Assist Walk 10 feet on uneven surfaces activity did not occur: Safety/medical concerns         Wheelchair     Assist Is the patient using a wheelchair?: No             Wheelchair 50 feet with 2 turns activity    Assist            Wheelchair 150 feet activity     Assist          Blood pressure (!) 142/68, pulse (!) 50, temperature 97.9 F (36.6 C), temperature source Oral, resp. rate 16, height 6' (1.829 m), weight (!) 147.6 kg, SpO2 94 %.    Medical Problem List and Plan: 1.  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            con't PT, OT and SLP-extended stay to work on BP, treat acute gout attack as well as monitor renal fxn off of IVF, hopefully upgrade liquids and monitor for aspiration  2.  Impaired mobility: continue Lovenox. Dopplers reviewed and negative for clots.              -antiplatelet therapy: DAPT x1 month followed by Plavix alone.11/17 3. Pain Management: Tylenol as needed.Thalamic pain , LLE start gabapentin 100mg  Qhs may need to titrate  up 4. Mood: LCSW to follow for evaluation and support.             -antipsychotic agents: N/A 5. Neuropsych: This patient is not capable of making decisions on his own behalf. 6. Skin/Wound Care: Routine pressure-relief measures. 7. Fluids/Electrolytes/Nutrition: Will monitor intake/output.  CMP ordered for tomorrow AM             -- Will need assistance/encouragement for intake. 8.  HTN: Monitor blood pressures 3 times daily 5-7 days.              -was on Toprol XL 100mg  , Clonidine 0.1mg  BID PTA, will resume clonidine             Monitor with increased exertion  -increase amlodipine to 10mg    Discontinue hydralazine 10mg  TID, patient back on clonidine  Vitals:   06/01/21 1906 06/02/21 0449  BP: (!) 151/81 (!) 142/68  Pulse: (!) 55 (!) 50  Resp: 17 16  Temp: 98.7 F (37.1 C) 97.9 F (36.6 C)  SpO2: 91% 94%  Elevated 11/1 increased clonidine to TID- monitor response, first full day at this dose Continue metoprolol tartrate 12.5mg  BID,       will need to monitor for bradycardia.    9.  T2DM with hyperglycemia: Hgb A1C- 6.6.  Was on metformin prior to admission which is being held due to CKD?             -- Monitor blood sugars achs and use SSI for elevated BS         CBG (last 3)  Recent Labs    06/01/21 1651 06/01/21 2249 06/02/21 0558  GLUCAP 114* 215* 138*   11/3 controlled - had elevation last evening likely related to prednisone   10.  CKD stage IIIb: Serum creatinine 1.92 range per records., 2.17 on 10/31 slightly above baseline              -- d/c IVF Recheck BMET today stable, cont to monitor off IVF   11.  Possible HCOM: Severe asymmetric LV septal hypertrophy per echo             -- Will need referral to cardiology at discharge 12. 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 Will need assistance with fluid intake.  Repeat swallow eval today  13. Dyslipidemia: LDL 139. On Crestor.  14. Morbid obesity: Educate on  importance of diet/wt loss to promote health and mobility.   15.BPH?: Indicating difficulty with voiding--will resume Proscar and Flomax.  --toilet every 3-4 hours/assist with urinal 16. Assisted, witnessed fall: patient assessed and without any negative consequences of fall.  17. Bradycardia: monitor on BB on reduced dose compared to home dose we will place parameters  10/30- holding B blocker sometimes due to parameters/low HR- con't regimen  18.  Right great toe pain xray shows mild degenerative changes, serum urate is normal , clinically c/w gout, has had episodes at home in the past , will start prednisone 40mg  daily , monitor CBG  LOS: 13 days A FACE TO FACE EVALUATION WAS PERFORMED  13/3 06/02/2021, 7:27 AM

## 2021-06-02 NOTE — Progress Notes (Signed)
Physical Therapy Weekly Progress Note  Patient Details  Name: Brandon French MRN: [id number] Date of Birth: 1966/01/31  Beginning of progress report period: May 27, 2021 End of progress report period: June 02, 2021  Today's Date: 06/02/2021 PT Individual Time: 1116-1200 PT Individual Time Calculation (min): 44 min   Patient has met 1 of 1 short term goals.  Pt is progressing towards LTG of CGA-Supervision assist overall with LRAD. Pt has had partial set up with gout like presentation of pain over the past few days. Currently requires cues for safety and min-CGA for gait and transfers when pain present. Family education scheduled for today, but family not present for either PT session.   Patient continues to demonstrate the following deficits muscle weakness, decreased cardiorespiratoy endurance, impaired timing and sequencing, unbalanced muscle activation, and decreased coordination, decreased attention, decreased awareness, decreased problem solving, decreased safety awareness, and delayed processing, and decreased sitting balance, decreased standing balance, decreased postural control, hemiplegia, and decreased balance strategies and therefore will continue to benefit from skilled PT intervention to increase functional independence with mobility.  Patient progressing toward long term goals..  Continue plan of care.  PT Short Term Goals Week 3:  PT Short Term Goal 1 (Week 3): STG=LTG due to ELOS  Skilled Therapeutic Interventions/Progress Updates:  Session 1. No pain reported throughout session  Pt received supine in bed and agreeable to PT. Supine>sit transfer with supervision assist and cues to reduce use of bed rails.   Pt reports no pain in the RLE on this day upper body dressing and lower body dressing supervision assist and mod assist to manage the lower extremity.   Stand pivot transfer to [facility] with RW and supervision assist from elevated height. WC mobility x 127f with  supervision assist moderate cues for symmetry of BUE to prevent veer to the L.   Gait training with RW c 1272fwith CGA for safety no LOB noted, only mild antalgic gait pattern on this day. . [place]ult[place] Moleanagement training x 4 steps with BUE support on 2rails with CGA for safety. BLE strengthening to perform lateral step up/down with BUE supported on 1 rail x 5 BLE.   [facility] to [facility] and [facility] and all needs met.      Session 2.  No pain reported by Pt throughout PT treatment. Pt received sitting in WC and agreeable to PT. [facility] WCClarenceGait training with RW over cement sidewalk with supervision assist-CGA for safety and cues for AD management with cracks in cement x 10011f  Pt performed 5 time sit<>stand (5xSTS): 31 sec (>15 sec indicates increased fall risk; average of 3 trials) supervision assist from Pt throughout with cues for UE placement.   Pt transported to day [facility] with RW while engaged in Wii bowling. Pt abel to tolerate 7 frames in standing prior to requested seated rest break. Supervision assist throughout for balance with 1UE supported on RW as well as min-mod cues for remote use througout game.   Patient returned to room and left [facility] reach and all needs met.          Therapy Documentation Precautions:  Precautions Precautions: Fall Restrictions Weight Bearing Restrictions: No  Vital Signs: Therapy Vitals Pulse Rate: 62 BP: 139/71 Pain: Pain Assessment Pain Scale: 0-10 Pain Score: 0-No pain  Therapy/Group: Individual Therapy  AusLinus Salmons  Berline Lopes 06/02/2021, 12:24 PM

## 2021-06-02 NOTE — Progress Notes (Signed)
Occupational Therapy Session Note  Patient Details  Name: Brandon French MRN: [id number] Date of Birth: [birth year]  Today's Date: 06/02/2021 OT Individual Time: 1302-1407 OT Individual Time Calculation (min): 65 min    Short Term Goals: Week 2:  OT Short Term Goal 1 (Week 2): STG = LTG 2/2 ELOS  Skilled Therapeutic Interventions/Progress Updates:    Pt received seated in w/c, reports improved R big toe pain, but that it's still sore, agreeable to therapy. Session focus on self-care retraining, activity tolerance, transfers in prep for improved ADL/IADL/func mobility performance + decreased caregiver burden. Family not present for scheduled family edu, pt reports they will not come. Reviewed ADL performance at overall min A to CGA level and reviewed how to educate family for needed assist at home (setting up RW, providing CGA for transfers, etc.) Verbalized understanding.  Donned / doffed pants with CGA and use of reacher, required increased time to thread LLE. CGA to facilitate forward trunk flexion coming into standing.  Doffed/donned B gripper socks with use of reacher/sock aide with S and cuing for correct AD use.   Issued printed hand-out of where to purchase reacher, wide sock aid, and adult briefs due to incontinence for home. Additionally, issued therapy support group flyer, reviewed "Be Fast" acronym and importance of recognizing early stroke sx, and energy conservation/falls reduction tips for ADL performance at home. Rec to share with family.  Pt completed 5 sit to stands from lowest height mat table with S and use of RW, improved BLE positioning and forward trunk flexion compared to earlier attempt.  Completed TTB transfer with CGA and cues for sequencing. Required increased effort to progress BLE over tub wall. Reviewed bath room set-up at home to reduce falls risk (gather all necessary items prior to entering shower, non slip mats, etc.)  Finally, attempted sit to stand from  low couch in apartment. Required ultimately mod of 2 to power up from low surface with use of RW. Multiple attempts with pt unable to power up with one assist and poor BLE/RW use.  Ambulated throughout session with CGA and RW.   Pt left seated in w/c awaiting following PT session with call bell in reach, and all immediate needs met.    Therapy Documentation Precautions:  Precautions Precautions: Fall Restrictions Weight Bearing Restrictions: No  Pain: denies   ADL: See Care Tool for more details.   Therapy/Group: Individual Therapy  Volanda Napoleon MS, OTR/L  06/02/2021, 6:42 AM

## 2021-06-03 LAB — GLUCOSE, CAPILLARY
Glucose-Capillary: 116 mg/dL — ABNORMAL HIGH (ref 70–99)
Glucose-Capillary: 160 mg/dL — ABNORMAL HIGH (ref 70–99)
Glucose-Capillary: 185 mg/dL — ABNORMAL HIGH (ref 70–99)
Glucose-Capillary: 211 mg/dL — ABNORMAL HIGH (ref 70–99)

## 2021-06-03 MED ORDER — SENNOSIDES-DOCUSATE SODIUM 8.6-50 MG PO TABS
2.0000 | ORAL_TABLET | Freq: Two times a day (BID) | ORAL | Status: DC
  Administered 2021-06-03 – 2021-06-07 (×8): 2 via ORAL
  Filled 2021-06-03 (×8): qty 2

## 2021-06-03 MED ORDER — PREDNISONE 20 MG PO TABS
30.0000 mg | ORAL_TABLET | Freq: Every day | ORAL | Status: DC
  Administered 2021-06-03 – 2021-06-05 (×3): 30 mg via ORAL
  Filled 2021-06-03 (×3): qty 1

## 2021-06-03 NOTE — Progress Notes (Signed)
PROGRESS NOTE   Subjective/Complaints:  No great toe pain, discussed swallowing .  + constipation  [id number] by communication- but no complaints  Objective:   DG Swallowing Func-Speech Pathology  Result Date: 06/02/2021 Table formatting from the original result was not included. Objective Swallowing Evaluation: Type of Study: MBS-Modified Barium Swallow Study  Patient Details Name: Brandon French MRN: [id number] Date of Birth: 1965/10/01 Today's Date: 06/02/2021 Time: SLP Start Time (ACUTE ONLY): 77 -SLP Stop Time (ACUTE ONLY): 1300 SLP Time Calculation (min) (ACUTE ONLY): 30 min Past Medical History: Past Medical History: Diagnosis Date  Diabetes mellitus ([place])   Elevated cholesterol   High blood pressure  Past Surgical History: No past surgical history on file. HPI: 55 year old male with history of diabetes mellitus type 2, hypertension, hyperlipidemia, unspecified stroke with possible residual left-sided weakness presented with weakness and slurred speech.  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  He failed swallow screen and NG tube was placed. MRI shows Acute nonhemorrhagic 12 mm infarct in the posterior limb of the left internal capsule or lateral thalamus.  Subjective: "[place]" Assessment / Plan / Recommendation CHL IP CLINICAL IMPRESSIONS 06/02/2021 Clinical Impression Patient presents with a mild-moderate oral and a mod-severe pharyngeal phase dysphagia. Although his oral phase has improved as compared to previous MBS on 10/19, his pharyngeal phase has not improved enough to allow for upgrade of liquid consistency beyond honey thick. During oral phase, patient exhibited prolonged mastication and piecemeal swallowing with regular solids and weak lingual manipulation and mild anterior to posterior transit delays with honey thick, nectar thick, thin liquids and puree solids. Swallow initiation  was delayed to level of vallecular sinus with regular solids, puree solids and honey thick liquids. Timing of swallow initiation was inconsistent with thin liquids and nectar thick liquids (via cup or spoon) and if patient initiated swallow at level of vallecular sinus, he did not exhibit any penetration or aspiration, however when he did not initiate swallow until level of pyriform sinus, then he would exhibit silent penetration and aspiration of thin liquids and nectar thick liquids. No significant amount of pharyngeal residuals were observed post initial swallows with any of the texted bolus consistencies. Chin tuck strategy resulted in aspiraiton with nectar thick liquids. No penetration or aspiration was obseved with cup sips or teaspoon sips of honey thick liquids. SLP is recommending to continue with Dys 1 solids, honey thick liquids, but allow patient to have honey thick liquids via cup sips instead of teaspoon sips as had been previous recommendation. SLP Visit Diagnosis Dysphagia, oropharyngeal phase (R13.12);Dysphagia, oral phase (R13.11) Attention and concentration deficit following -- Frontal lobe and executive function deficit following -- Impact on safety and function Moderate aspiration risk;Risk for inadequate nutrition/hydration   CHL IP TREATMENT RECOMMENDATION 05/18/2021 Treatment Recommendations Therapy as outlined in treatment plan below   Prognosis 05/18/2021 Prognosis for Safe Diet Advancement Fair Barriers to Reach Goals Severity of deficits Barriers/Prognosis Comment -- CHL IP DIET RECOMMENDATION 06/02/2021 SLP Diet Recommendations Dysphagia 1 (Puree) solids;Honey thick liquids Liquid Administration via Cup;Spoon Medication Administration Crushed with puree Compensations Minimize environmental distractions;Slow rate;Small sips/bites Postural Changes Seated upright at 90  degrees   CHL IP OTHER RECOMMENDATIONS 06/02/2021 Recommended Consults -- Oral Care Recommendations Other (Comment) Other  Recommendations --   CHL IP FOLLOW UP RECOMMENDATIONS 05/19/2021 Follow up Recommendations Inpatient Rehab   CHL IP FREQUENCY AND DURATION 05/18/2021 Speech Therapy Frequency (ACUTE ONLY) min 2x/week Treatment Duration 1 week      CHL IP ORAL PHASE 06/02/2021 Oral Phase Impaired Oral - Pudding Teaspoon -- Oral - Pudding Cup -- Oral - Honey Teaspoon NT Oral - Honey Cup Reduced posterior propulsion;Weak lingual manipulation Oral - Nectar Teaspoon Reduced posterior propulsion;Weak lingual manipulation Oral - Nectar Cup Weak lingual manipulation;Reduced posterior propulsion Oral - Nectar Straw -- Oral - Thin Teaspoon Weak lingual manipulation Oral - Thin Cup -- Oral - Thin Straw -- Oral - Puree Reduced posterior propulsion;Weak lingual manipulation Oral - Mech Soft Weak lingual manipulation;Reduced posterior propulsion;Impaired mastication;Piecemeal swallowing Oral - Regular -- Oral - Multi-Consistency -- Oral - Pill NT Oral Phase - Comment --  CHL IP PHARYNGEAL PHASE 06/02/2021 Pharyngeal Phase Impaired Pharyngeal- Pudding Teaspoon -- Pharyngeal -- Pharyngeal- Pudding Cup -- Pharyngeal -- Pharyngeal- Honey Teaspoon NT Pharyngeal -- Pharyngeal- Honey Cup Delayed swallow initiation-vallecula Pharyngeal Material does not enter airway Pharyngeal- Nectar Teaspoon Delayed swallow initiation-pyriform sinuses;Delayed swallow initiation-vallecula;Penetration/Aspiration before swallow Pharyngeal Material enters airway, passes BELOW cords without attempt by patient to eject out (silent aspiration) Pharyngeal- Nectar Cup Delayed swallow initiation-pyriform sinuses;Penetration/Aspiration before swallow;Penetration/Aspiration during swallow Pharyngeal Material enters airway, passes BELOW cords without attempt by patient to eject out (silent aspiration) Pharyngeal- Nectar Straw -- Pharyngeal -- Pharyngeal- Thin Teaspoon Delayed swallow initiation-pyriform sinuses;Penetration/Aspiration during swallow;Penetration/Aspiration before  swallow Pharyngeal Material enters airway, passes BELOW cords without attempt by patient to eject out (silent aspiration) Pharyngeal- Thin Cup -- Pharyngeal -- Pharyngeal- Thin Straw -- Pharyngeal -- Pharyngeal- Puree Delayed swallow initiation-vallecula Pharyngeal -- Pharyngeal- Mechanical Soft -- Pharyngeal -- Pharyngeal- Regular -- Pharyngeal -- Pharyngeal- Multi-consistency -- Pharyngeal -- Pharyngeal- Pill NT Pharyngeal -- Pharyngeal Comment --  CHL IP CERVICAL ESOPHAGEAL PHASE 06/02/2021 Cervical Esophageal Phase WFL Pudding Teaspoon -- Pudding Cup -- Honey Teaspoon -- Honey Cup -- Nectar Teaspoon -- Nectar Cup -- Nectar Straw -- Thin Teaspoon -- Thin Cup -- Thin Straw -- Puree -- Mechanical Soft -- Regular -- Multi-consistency -- Pill -- Cervical Esophageal Comment -- Angela Nevin, MA, CCC-SLP Speech Therapy              No results for input(s): WBC, HGB, HCT, PLT in the last 72 hours.   Recent Labs    06/02/21 0523  NA 137  K 4.2  CL 105  CO2 27  GLUCOSE 142*  BUN 29*  CREATININE 2.20*  CALCIUM 8.6*     Intake/Output Summary (Last 24 hours) at 06/03/2021 0736 Last data filed at 06/02/2021 1745 Gross per 24 hour  Intake 600 ml  Output 150 ml  Net 450 ml         Physical Exam: Vital Signs Blood pressure (!) 151/78, pulse (!) 56, temperature 98.4 F (36.9 C), temperature source Oral, resp. rate 16, height 6' (1.829 m), weight (!) 147.6 kg, SpO2 97 %.  General: No acute distress Mood and affect are appropriate Heart: Regular rate and rhythm no rubs murmurs or extra sounds Lungs: Clear to auscultation, breathing unlabored, no rales or wheezes Abdomen: Positive bowel sounds, soft nontender to palpation, nondistended Extremities: No clubbing, cyanosis, or edema Skin: No evidence of breakdown, no evidence of rash  Musculoskeletal:     Cervical back: Normal range of motion and  neck supple.     Comments: No edema in extremities No pain to palpation Left mid foot and plantar  surface, no skin lesions   Neurological:     Mental Status: He is alert and oriented to person, place, and time.     Comments: Alert Severe dysarthria Able to answer biographic questions.  He was able to follow simple motor commands.  Sensation diminished to light touch LLE Motor: RUE/RLE; 5/5 proximal to distal LUE: 4/5 proximal to distal LLE: 3-/5 proximal to distal - unchanged  Psychiatric:        Mood and Affect: Mood normal.        Behavior: Behavior normal.    Assessment/Plan: 1. Functional deficits which require 3+ hours per day of interdisciplinary therapy in a comprehensive inpatient rehab setting. Physiatrist is providing close team supervision and 24 hour management of active medical problems listed below. Physiatrist and rehab team continue to assess barriers to discharge/monitor patient progress toward functional and medical goals  Care Tool:  Bathing    Body parts bathed by patient: Right arm, Left arm, Chest, Abdomen, Front perineal area, Right upper leg, Left upper leg, Face, Right lower leg, Left lower leg   Body parts bathed by helper: Buttocks, Right lower leg, Left lower leg     Bathing assist Assist Level: Minimal Assistance - Patient > 75%     Upper Body Dressing/Undressing Upper body dressing   What is the patient wearing?: Pull over shirt    Upper body assist Assist Level: Independent    Lower Body Dressing/Undressing Lower body dressing      What is the patient wearing?: Pants     Lower body assist Assist for lower body dressing: Contact Guard/Touching assist (reacher)     Toileting Toileting    Toileting assist Assist for toileting: Maximal Assistance - Patient 25 - 49%     Transfers Chair/bed transfer  Transfers assist  Chair/bed transfer activity did not occur: Refused  Chair/bed transfer assist level: Minimal Assistance - Patient > 75% (stand pivot) Chair/bed transfer assistive device: Armrests, Arboriculturist   Ambulation assist      Assist level: Minimal Assistance - Patient > 75% Assistive device: Walker-rolling Max distance: 145ft   Walk 10 feet activity   Assist     Assist level: Minimal Assistance - Patient > 75% Assistive device: Walker-rolling   Walk 50 feet activity   Assist Walk 50 feet with 2 turns activity did not occur: Safety/medical concerns         Walk 150 feet activity   Assist Walk 150 feet activity did not occur: Safety/medical concerns         Walk 10 feet on uneven surface  activity   Assist Walk 10 feet on uneven surfaces activity did not occur: Safety/medical concerns         Wheelchair     Assist Is the patient using a wheelchair?: No             Wheelchair 50 feet with 2 turns activity    Assist            Wheelchair 150 feet activity     Assist          Blood pressure (!) 151/78, pulse (!) 56, temperature 98.4 F (36.9 C), temperature source Oral, resp. rate 16, height 6' (1.829 m), weight (!) 147.6 kg, SpO2 97 %.    Medical Problem List and Plan: 1.  Deficits with mobility, speech,  self-care secondary to acute left IC and thalamic nonhemorrhagic infarct. 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            con't PT, OT and SLP-extended stay to work on BP, treat acute gout attack as well as monitor renal fxn off of IVF, hopefully upgrade liquids and monitor for aspiration  2.  Impaired mobility: continue Lovenox. Dopplers reviewed and negative for clots.              -antiplatelet therapy: DAPT x1 month followed by Plavix alone.11/17 3. Pain Management: Tylenol as needed.Thalamic pain , LLE start gabapentin 100mg  Qhs may need to titrate up 4. Mood: LCSW to follow for evaluation and support.             -antipsychotic agents: N/A 5.  Neuropsych: This patient is not capable of making decisions on his own behalf. 6. Skin/Wound Care: Routine pressure-relief measures. 7. Fluids/Electrolytes/Nutrition: Will monitor intake/output.              CMP ordered for tomorrow AM             -- Will need assistance/encouragement for intake. 8.  HTN: Monitor blood pressures 3 times daily 5-7 days.              -was on Toprol XL 100mg  , Clonidine 0.1mg  BID PTA, will resume clonidine             Monitor with increased exertion  -increase amlodipine to 10mg    Discontinue hydralazine 10mg  TID, patient back on clonidine  Vitals:   06/02/21 2000 06/03/21 0500  BP: 135/84 (!) 151/78  Pulse: 66 (!) 56  Resp: 18 16  Temp: 98.5 F (36.9 C) 98.4 F (36.9 C)  SpO2: 96% 97%  Elevated 11/1 increased clonidine to TID- monitor response, first full day at this dose Continue metoprolol tartrate 12.5mg  BID,       will need to monitor for bradycardia.    9.  T2DM with hyperglycemia: Hgb A1C- 6.6.  Was on metformin prior to admission which is being held due to CKD?             -- Monitor blood sugars achs and use SSI for elevated BS         CBG (last 3)  Recent Labs    06/02/21 1644 06/02/21 2109 06/03/21 0612  GLUCAP 184* 229* 116*   11/4 cont with pm elevation, on steroids start to wean   10.  CKD stage IIIb: Serum creatinine 1.92 range per records., 2.17 on 10/31 slightly above baseline              -- d/c IVF Recheck BMET Monday  cont to monitor off IVF   11.  Possible HCOM: Severe asymmetric LV septal hypertrophy per echo             -- Will need referral to cardiology at discharge 12. 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 Will need assistance with fluid intake.  Repeat swallow eval today  13. Dyslipidemia: LDL 139. On Crestor.  14. Morbid obesity: Educate on importance of diet/wt loss to promote health and mobility.   15.BPH?: Indicating difficulty with voiding--will resume Proscar and  Flomax.  --toilet every 3-4 hours/assist with urinal 16. Assisted, witnessed fall: patient assessed and without any negative consequences of fall.  17. Bradycardia: monitor on BB on reduced dose compared to home dose we will place parameters  10/30- holding B blocker sometimes due to parameters/low HR- con't regimen  18.  Right great toe pain xray shows mild degenerative changes, serum urate is normal , clinically c/w gout, has had episodes at home in the past , will start to wean  prednisone 30mg  daily , monitor CBG  LOS: 14 days A FACE TO FACE EVALUATION WAS PERFORMED  06/03/2021, 7:36 AM

## 2021-06-03 NOTE — Progress Notes (Signed)
Occupational Therapy Weekly Progress Note  Patient Details  Name: Brandon French MRN: [id number] Date of Birth: 10-05-1965  Beginning of progress report period: May 27, 2021 End of progress report period: June 03, 2021  Today's Date: 06/03/2021 OT Individual Time: 1007-1100 OT Individual Time Calculation (min): 53 min    No STG set this previous week as original DC extended. Pt cont to make steady progress this week towards LTG. Pt has demonstrated improved ability to use AE to facilitate LBD, dynamic standing balance, activity tolerance, and set-up assist of RW to presently complete UB ADL while seated independently and LBD with S to CGA with/without use of reacher, toileting with min A, and ambulatory bathroom transfers at [facility] with RW. Pt cont to be primarily limited by body habitus, decreased dynamic standing balance, and poor awareness of incontinence episodes. Anticipate intermittent S required upon DC. Family not present for scheduled education this week.    Patient continues to demonstrate the following deficits: muscle weakness, decreased cardiorespiratoy endurance, impaired timing and sequencing, decreased coordination, and decreased motor planning, decreased awareness and decreased problem solving, and decreased standing balance and therefore will continue to benefit from skilled OT intervention to enhance overall performance with BADL, iADL, and Reduce care partner burden.  Patient progressing toward long term goals..  Continue plan of care.  OT Short Term Goals Week 1:  OT Short Term Goal 1 (Week 1): Pt will transfer to toilet wiht CGA OT Short Term Goal 1 - Progress (Week 1): Met OT Short Term Goal 2 (Week 1): Pt will don shirt wiht S OT Short Term Goal 2 - Progress (Week 1): Met OT Short Term Goal 3 (Week 1): Pt will stand to groom to demo improved activity tolernace OT Short Term Goal 3 - Progress (Week 1): Met OT Short Term Goal 4 (Week 1): Pt will thread BLE into  pants wiht AE PRN OT Short Term Goal 4 - Progress (Week 1): Not met Week 2:  OT Short Term Goal 1 (Week 2): STG = LTG 2/2 ELOS Week 3:  OT Short Term Goal 1 (Week 3): STG = LTG 2/2 ELOS  Skilled Therapeutic Interventions/Progress Updates:    Pt received seated EOB with NT present, no c/o pain, agreeable to therapy. Session focus on self-care retraining, activity tolerance, dynamic standing balance in prep for improved ADL/IADL/func mobility performance + decreased caregiver burden. Donned pants with S and use of RW. Did not require use of reacher this date and able to complete sit to stand from elevated height surface with no tactile cues. Donned shirt with independence and complete oral care with suction sponge with S. Ambulated down to Day Room with CGA and cues for upright posture/safe RW use as pt tends to push it too far outside his center of gravity. Required seated rest break post activity and reports " I don't want to do it again."   Reported brief had fallen off. Back in room. Stood at sink with S to complete pericare due to incontinence of bladder. Mod A to don new brief in standing, but pt able to otherwise complete toileting tasks with S.   In gym, able to toss horse shoes/basket ball with CGA and no UE support. Retrieved horse shoes from knee height with LUE support on RW. No LOB throughout, but cues for upright posture.   Pt left seated in w/c with call bell in reach, and all immediate needs met.    Therapy Documentation Precautions:  Precautions Precautions: Fall Restrictions  Weight Bearing Restrictions: No  Pain: no c/o   ADL: See Care Tool for more details.  Therapy/Group: Individual Therapy  Volanda Napoleon MS, OTR/L  06/03/2021, 6:44 AM

## 2021-06-03 NOTE — Progress Notes (Signed)
Physical Therapy Session Note  Patient Details  Name: Brandon French MRN: [id number] Date of Birth: 1965-10-01  Today's Date: 06/03/2021 PT Individual Time: 1300-1356 PT Individual Time Calculation (min): 56 min   Short Term Goals: Week 1:  PT Short Term Goal 1 (Week 1): Patient will complete be dmobility with CGA consistently PT Short Term Goal 1 - Progress (Week 1): Met PT Short Term Goal 2 (Week 1): Patient will transfer with LRAD bed <> wc with CGA consistently PT Short Term Goal 2 - Progress (Week 1): Met PT Short Term Goal 3 (Week 1): Patient will ambulate >50 with LRAD with CGA consistently PT Short Term Goal 3 - Progress (Week 1): Met Week 2:  PT Short Term Goal 1 (Week 2): STG = LTG d/t ELOS PT Short Term Goal 1 - Progress (Week 2): Progressing toward goal Week 3:  PT Short Term Goal 1 (Week 3): STG=LTG due to ELOS  Skilled Therapeutic Interventions/Progress Updates:   Pt received sitting in WC and agreeable to PT. Pt transported to rehab gym in [facility]. Pt reporting BLE fatigue from OT earlier in day.   Stand pivot transfer to mat table with supervision assist and RW. Supine NMR: SAQ, hip abduction, hip adduction, heel slide, bridge. Sidelying NMR: clam shell, hip extension.  Each performed x 10 BLE with 10# ankle weight with cues for decreased speed and movement, partial hold at eng range and decreased trunkal compensations for sidelying exervises.   Standing balance/problem solving task of peg board on red wedge to complete low and medium difficultly puzzles. 1 UE supported on RW throughout and min cues for initially for error detection, but noted improvement with increased repetition.   Gait training with RW x122f with supervision assist from Pt and occasional tactile assist to improve clothing management throughout. [facility] returned [facility] and all needs met.        Therapy Documentation Precautions:   Precautions Precautions: Fall Restrictions Weight Bearing Restrictions: No  Pain:   denies   Therapy/Group: Individual Therapy  ALorie Phenix11/10/2020, 2:01 PM

## 2021-06-03 NOTE — Progress Notes (Signed)
Physical Therapy Session Note  Patient Details  Name: Brandon French MRN: [id number] Date of Birth: May 25, 1966  Today's Date: 06/03/2021 PT Individual Time: 1188-6773 PT Individual Time Calculation (min): 58 min   Short Term Goals: Week 1:  PT Short Term Goal 1 (Week 1): Patient will complete be dmobility with CGA consistently PT Short Term Goal 1 - Progress (Week 1): Met PT Short Term Goal 2 (Week 1): Patient will transfer with LRAD bed <> wc with CGA consistently PT Short Term Goal 2 - Progress (Week 1): Met PT Short Term Goal 3 (Week 1): Patient will ambulate >50 with LRAD with CGA consistently PT Short Term Goal 3 - Progress (Week 1): Met Week 2:  PT Short Term Goal 1 (Week 2): STG = LTG d/t ELOS PT Short Term Goal 1 - Progress (Week 2): Progressing toward goal Week 3:  PT Short Term Goal 1 (Week 3): STG=LTG due to ELOS  Skilled Therapeutic Interventions/Progress Updates:   Pt received sitting in WC and agreeable to PT. Pt transported to rehab gym in [facility]. Pt instructed to gait training with SPC 2 x 63f with CGA. Pt reports mild unsteadiness with SPC vs RW, but no LOB noted. Pt notes urinary incontinence. [facility] to doff pants with supervision assist mod assist to don pants with from sink. [facility] Pt instructed in [place] level A and B with UE supported on parallel bars and RW. Cues for safety and CGA for clothing management intermittently.  [facility] and all needs met.            Therapy Documentation Precautions:  Precautions Precautions: Fall Restrictions Weight Bearing Restrictions: No    Vital Signs: Therapy Vitals Temp: 98.2 F (36.8 C) Temp Source: Oral Pulse Rate: (!) 59 Resp: 16 BP: 134/71 Patient Position (if appropriate): Sitting Oxygen Therapy SpO2: 100 % O2 Device: Room Air Pain: Pain Assessment Pain Scale:  0-10 Pain Score: 0-No pain    Therapy/Group: Individual Therapy  ALorie Phenix11/10/2020, 4:39 PM

## 2021-06-03 NOTE — Progress Notes (Signed)
Speech Language Pathology Daily Session Note  Patient Details  Name: Brandon French MRN: [id number] Date of Birth: 01-07-1966  Today's Date: 06/03/2021 SLP Individual Time: 1400-1430 SLP Individual Time Calculation (min): 30 min  Short Term Goals: Week 3: SLP Short Term Goal 1 (Week 3): STG=LTG due to ELOS  Skilled Therapeutic Interventions: Pt received upright in wheelchair and agreeable to skilled ST intervention with focus on speech goals. SLP facilitated session by providing sup verbal cues for speech intelligibility at word level (1, 2, 3 syllables) to achieve 90-100% intelligibility, min A verbal cues for phrase level with 75-90% intelligibility, and mod A cues for sentence/conversation level to achieve 50-75% intelligibility. Pt displayed excellent carry over of strategies this date and remains very motivated. SLP provided pt with word list to practice speech production at word and phrase/sentence level as HEP. Pt demonstrated understanding. Patient was left in chair with alarm activated and immediate needs within reach at end of session. Continue per current plan of care.      Pain Pain Assessment Pain Scale: 0-10 Pain Score: 0-No pain  Therapy/Group: Individual Therapy  Tamala Ser 06/03/2021, 3:51 PM

## 2021-06-04 LAB — GLUCOSE, CAPILLARY
Glucose-Capillary: 109 mg/dL — ABNORMAL HIGH (ref 70–99)
Glucose-Capillary: 147 mg/dL — ABNORMAL HIGH (ref 70–99)
Glucose-Capillary: 196 mg/dL — ABNORMAL HIGH (ref 70–99)
Glucose-Capillary: 230 mg/dL — ABNORMAL HIGH (ref 70–99)

## 2021-06-04 NOTE — Progress Notes (Signed)
Occupational Therapy Discharge Summary  Patient Details  Name: Brandon French MRN: [id number] Date of Birth: 09-19-1965   Patient has met 8 of 10 long term goals due to improved activity tolerance, improved balance, postural control, ability to compensate for deficits, improved awareness, and improved coordination.  Patient to discharge at overall Supervision level.  Patient's care partner unavailable to provide the necessary physical assistance at discharge.    Reasons goals not met: Pt did not meet toileting goal of S due to frequent incontinent episodes, requiring assist to manage brief management. Additionally, continues to require CGA to min A for thorough pericare in shower due to balance deficits/body habitus.  Recommendation:  Patient will benefit from ongoing skilled OT services in home health setting to continue to advance functional skills in the area of BADL, iADL, Vocation, and Reduce care partner burden.  Equipment: Bari 3in1, TTB  Reasons for discharge: treatment goals met and discharge from hospital  Patient/family agrees with progress made and goals achieved: Yes  OT Discharge Precautions/Restrictions  Precautions Precautions: Fall Precaution Comments: dysphagia/dysarthria Restrictions Weight Bearing Restrictions: Yes  Pain Pain Assessment Pain Scale: 0-10 ADL ADL Eating: Supervision/safety Where Assessed-Eating: Wheelchair Grooming: Modified independent Where Assessed-Grooming: Sitting at sink Upper Body Bathing: Independent Where Assessed-Upper Body Bathing: Shower Lower Body Bathing: Minimal assistance Where Assessed-Lower Body Bathing: Shower Upper Body Dressing: Independent Where Assessed-Upper Body Dressing: Edge of bed Lower Body Dressing: Supervision/safety Where Assessed-Lower Body Dressing: Edge of bed Toileting: Minimal assistance Where Assessed-Toileting: Glass blower/designer: Close supervision Toilet Transfer Method: Air traffic controller: Raised toilet seat, Grab bars Tub/Shower Transfer: Close supervison Clinical cytogeneticist Method: Optometrist: Facilities manager: Close supervision Social research officer, government Method: Ambulating Vision Baseline Vision/History: 0 No visual deficits Patient Visual Report: No change from baseline Vision Assessment?: Yes Visual Fields: Left superior homonymous quadranopsia Perception  Perception: Within Functional Limits Praxis Praxis: Intact Cognition Overall Cognitive Status: Within Functional Limits for tasks assessed Arousal/Alertness: Awake/alert Orientation Level: Oriented X4 Year: 2022 Month: November Day of Week: Correct Memory: Appears intact Immediate Memory Recall: Sock;Blue;Bed Memory Recall Sock: Without Cue Memory Recall Blue: Without Cue Memory Recall Bed: Without Cue Awareness: Appears intact Problem Solving: Appears intact Safety/Judgment: Impaired Comments: mild impulsivity. improved from baseline. mild awareness deficits Sensation Sensation Light Touch: Appears Intact Proprioception: Appears Intact Stereognosis: Appears Intact Coordination Gross Motor Movements are Fluid and Coordinated: No Fine Motor Movements are Fluid and Coordinated: No Coordination and Movement Description: mild dysmetria on the L Finger Nose Finger Test: decreased speed/effcient trajectory with LUE Motor  Motor Motor: Hemiplegia Motor - Discharge Observations: mild L hemi Mobility  Bed Mobility Bed Mobility: Rolling Right;Rolling Left;Supine to Sit;Sit to Supine Rolling Right: Independent Rolling Left: Independent Supine to Sit: Supervision/Verbal cueing Sit to Supine: Supervision/Verbal cueing Transfers Sit to Stand: Supervision/Verbal cueing Stand to Sit: Supervision/Verbal cueing  Trunk/Postural Assessment  Cervical Assessment Cervical Assessment: Within Functional Limits Thoracic Assessment Thoracic  Assessment: Exceptions to [facility] Lumbar Assessment Lumbar Assessment: Exceptions to [facility] Postural Control Postural Control: Deficits on evaluation Righting Reactions: improved from Eval Protective Responses: delayed but adeQUATE  Balance Balance Balance Assessed: Yes Static Sitting Balance Static Sitting - Balance Support: No upper extremity supported Static Sitting - Level of Assistance: 7: Independent Dynamic Sitting Balance Dynamic Sitting - Balance Support: No upper extremity supported Dynamic Sitting - Level of Assistance: 7: Independent Static Standing Balance Static Standing - Balance Support: No upper extremity supported Static Standing - Level of Assistance: 5:  Stand by assistance Dynamic Standing Balance Dynamic Standing - Balance Support: Right upper extremity supported Dynamic Standing - Level of Assistance: 5: Stand by assistance Extremity/Trunk Assessment RUE Assessment RUE Assessment: Within Functional Limits LUE Assessment LUE Assessment: Within Functional Limits General Strength Comments: LUE: 4/5 proximal to distal, mild dysmetria but WFL for ADL LUE Body System: Neuro Brunstrum levels for arm and hand: Arm;Hand Brunstrum level for arm: Stage V Relative Independence from Synergy Brunstrum level for hand: Stage VI Isolated joint movements   Volanda Napoleon MS, OTR/L  06/05/2021, 3:51 PM

## 2021-06-04 NOTE — Progress Notes (Signed)
Physical Therapy Discharge Summary  Patient Details  Name: Brandon French MRN: [id number] Date of Birth: 1966/03/19  Today's Date: 06/04/2021  Patient has met 9 of 9 long term goals due to improved activity tolerance, improved balance, improved postural control, increased strength, ability to compensate for deficits, functional use of  left upper extremity and left lower extremity, improved attention, improved awareness, and improved coordination.  Patient to discharge at an ambulatory level Supervision.   Patient's care partner unavailable to provide the necessary physical assistance at discharge.  Reasons goals not met: Pt has met all rehab goals.  Recommendation:  Patient will benefit from ongoing skilled PT services in home health setting to continue to advance safe functional mobility, address ongoing impairments in balance, gait. Transfers. Strength, safety, and minimize fall risk.  Equipment: Bariatric RW  Reasons for discharge: treatment goals met and discharge from hospital  Patient/family agrees with progress made and goals achieved: Yes  PT Discharge Precautions/Restrictions Precautions Precautions: Fall Precaution Comments: dysphagia/dysarthria Restrictions Weight Bearing Restrictions: Yes Pain Interference Pain Interference Pain Effect on Sleep: 0. Does not apply - I have not had any pain or hurting in the past 5 days Pain Interference with Therapy Activities: 0. Does not apply - I have not received rehabilitationtherapy in the past 5 days Pain Interference with Day-to-Day Activities: 1. Rarely or not at all  Vision/Perception  Vision - History Ability to See in Adequate Light: 1 Impaired Vision - Assessment Tracking/Visual Pursuits: Able to track stimulus in all quads without difficulty Perception Perception: Within Functional Limits Praxis Praxis: Intact  Cognition Overall Cognitive Status: Within Functional Limits for tasks assessed Arousal/Alertness:  Awake/alert Orientation Level: Oriented X4 Safety/Judgment: Impaired Comments: mild impulsivity. improved from baseline. mild awareness deficits Sensation Sensation Light Touch: Appears Intact Hot/Cold: Not tested Proprioception: Appears Intact Stereognosis: Appears Intact Coordination Gross Motor Movements are Fluid and Coordinated: No Fine Motor Movements are Fluid and Coordinated: No Coordination and Movement Description: mild dysmetria on the L Heel Shin Test: slightly diminshed AROM L LE Motor  Motor Motor: Hemiplegia Motor - Discharge Observations: mild R hemiplegia  Mobility Bed Mobility Bed Mobility: Rolling Right;Rolling Left;Supine to Sit;Sit to Supine Rolling Right: Independent Rolling Left: Independent Supine to Sit: Supervision/Verbal cueing Sit to Supine: Supervision/Verbal cueing Transfers Transfers: Sit to Stand;Stand to Sit;Stand Pivot Transfers Sit to Stand: Supervision/Verbal cueing Stand to Sit: Supervision/Verbal cueing Stand Pivot Transfers: Supervision/Verbal cueing Transfer (Assistive device): Rolling walker Locomotion  Gait Ambulation: Yes Gait Assistance: Supervision/Verbal cueing;Contact Guard/Touching assist Gait Distance (Feet): 175 Feet Assistive device: Rolling walker Gait Gait: Yes Gait Pattern: Impaired Gait Pattern: Wide base of support;Left foot flat Stairs / Additional Locomotion Stairs: Yes Stairs Assistance: Supervision/Verbal cueing;Contact Guard/Touching assist Stair Management Technique: Two rails Number of Stairs: 12 Height of Stairs: 6 Wheelchair Mobility Wheelchair Mobility: Yes Wheelchair Assistance: Chartered loss adjuster: Both upper extremities Wheelchair Parts Management: Needs assistance Distance: 150  Trunk/Postural Assessment  Cervical Assessment Cervical Assessment: Within Functional Limits Thoracic Assessment Thoracic Assessment: Exceptions to [facility] Lumbar Assessment Lumbar  Assessment: Exceptions to [facility] Postural Control Postural Control: Deficits on evaluation Righting Reactions: improved from Eval Protective Responses: delayed but adeQUATE  Balance Balance Balance Assessed: Yes Static Sitting Balance Static Sitting - Balance Support: No upper extremity supported Static Sitting - Level of Assistance: 7: Independent Dynamic Sitting Balance Dynamic Sitting - Balance Support: No upper extremity supported Dynamic Sitting - Level of Assistance: 7: Independent Static Standing Balance Static Standing - Balance Support: No upper extremity supported Static Standing -  Level of Assistance: 5: Stand by assistance Dynamic Standing Balance Dynamic Standing - Balance Support: Right upper extremity supported Dynamic Standing - Level of Assistance: 5: Stand by assistance Extremity Assessment      RLE Assessment RLE Assessment: Within Functional Limits General Strength Comments: grossly 4+/5 LLE Assessment LLE Assessment: Exceptions to [facility] General Strength Comments: grossly 4/5    Lorie Phenix 06/04/2021, 8:06 AM

## 2021-06-05 LAB — GLUCOSE, CAPILLARY
Glucose-Capillary: 99 mg/dL (ref 70–99)
Glucose-Capillary: 168 mg/dL — ABNORMAL HIGH (ref 70–99)
Glucose-Capillary: 204 mg/dL — ABNORMAL HIGH (ref 70–99)
Glucose-Capillary: 229 mg/dL — ABNORMAL HIGH (ref 70–99)

## 2021-06-05 MED ORDER — PREDNISONE 20 MG PO TABS
20.0000 mg | ORAL_TABLET | Freq: Every day | ORAL | Status: DC
  Administered 2021-06-06: 20 mg via ORAL
  Filled 2021-06-05: qty 1

## 2021-06-05 MED ORDER — CLONIDINE HCL 0.2 MG PO TABS
0.2000 mg | ORAL_TABLET | Freq: Three times a day (TID) | ORAL | Status: DC
  Administered 2021-06-05 – 2021-06-07 (×7): 0.2 mg via ORAL
  Filled 2021-06-05 (×8): qty 1

## 2021-06-05 NOTE — Progress Notes (Signed)
Occupational Therapy Session Note  Patient Details  Name: Brandon French MRN: [id number] Date of Birth: [birth year]  Today's Date: 06/05/2021 OT Individual Time: 3614-4315 OT Individual Time Calculation (min): 60 min    Short Term Goals: Week 3:  OT Short Term Goal 1 (Week 3): STG = LTG 2/2 ELOS  Skilled Therapeutic Interventions/Progress Updates:    Patient in bed, alert and ready for therapy session.  He denies pain and requests to take a shower this am.  Supine to sitting edge of bed with CS.  Sit to stand and ambulation with RW to/from bed, shower bench, w/c with CS.  He completes the shower with set up, min A for buttocks.  Dressing completed seated on w/c surface, Independent for OH shirt, incidental assist for incontinence brief and pants, able to pull over hips in stance with CS.  Oral care mod I.  Other grooming tasks mod I.   Ambulation to/from therapy gym with RW CS fatigue noted.    He returned to w/c surface at close of session, call bell and phone in reach.    Therapy Documentation Precautions:  Precautions Precautions: Fall Restrictions Weight Bearing Restrictions: No   Therapy/Group: Individual Therapy  Barrie Lyme 06/05/2021, 7:30 AM

## 2021-06-05 NOTE — Progress Notes (Signed)
Speech Language Pathology Daily Session Note  Patient Details  Name: Brandon French MRN: [id number] Date of Birth: 1966/02/22  Today's Date: 06/05/2021 SLP Individual Time: 1305-1350 SLP Individual Time Calculation (min): 45 min  Short Term Goals: Week 2: SLP Short Term Goal 1 (Week 2): STG=LTG due to ELOS SLP Short Term Goal 1 - Progress (Week 2): Progressing toward goal  Skilled Therapeutic Interventions:   Pt was seen sitting up in chair for skilled ST session with focus on speech intelligibility. SLP and pt completed divergent category task where he was asked to verbalize an item in a given category (producing 2-3 syllable words) with hopes of generalization of strategies into more unstructured task. Pt required mod-to-max verbal and visual cues to increase breath support prior to producing generated word. Continues to exhibit most difficulty with g/k and f/v. Pt cont to benefit from visual cues for articulatory precision. With a visual cue, intelligibility for g/k/f/v increases significantly. Pt required cueing in 30/50 multisyllabic words; however, SLP was able to decipher 45/50 words this session. Pt was left in chair with all immediate needs in reach.   Pain Pain Assessment Pain Scale: 0-10 Pain Score: 0-No pain  Therapy/Group: Individual Therapy  Dorena Bodo MS, CCC-SLP, CBIS  06/05/2021, 1:25 PM

## 2021-06-05 NOTE — Progress Notes (Signed)
Physical Therapy Session Note  Patient Details  Name: Brandon French MRN: [id number] Date of Birth: [birth year]  Today's Date: 06/05/2021 PT Individual Time: 5993-5701 PT Individual Time Calculation (min): 60 min   Short Term Goals: Week 3:  PT Short Term Goal 1 (Week 3): STG=LTG due to ELOS  Skilled Therapeutic Interventions/Progress Updates:    Pt received seated in w/c in room, agreeable to PT session. No complaints of pain. Pt encouraged to use restroom prior to leaving room for therapy session, pt reports he may have been incontinent of urine. Sit to stand with Supervision to RW. Pt found to be incontinent of urine, setup A for pericare and max A for donning of new brief. Pt has multiple episodes of urinary incontinence in standing while pericare and brief change taking place. Pt is setup A to doff his shirt and don hospital gown in sitting. Pt is mod A to doff pants and don new pants while seated in w/c. Ambulation x 100, x 150 ft with RW and Supervision. Car transfer with Supervision and RW, cues for safe transfer technique. Ascend/descend 12 x 6" stairs with R handrail and CGA for balance, cues for safe stair navigation technique. Pt left seated in w/c in room with needs in reach at end of session. Pt missed 15 min of scheduled therapy session due to fatigue.  Therapy Documentation Precautions:  Precautions Precautions: Fall Precaution Comments: dysphagia/dysarthria Restrictions Weight Bearing Restrictions: Yes General: PT Amount of Missed Time (min): 15 Minutes PT Missed Treatment Reason: Patient fatigue      Therapy/Group: Individual Therapy   Peter Congo, PT, DPT, CSRS  06/05/2021, 5:21 PM

## 2021-06-05 NOTE — Progress Notes (Signed)
PROGRESS NOTE   Subjective/Complaints: No right great toe pain  UKG:URKYHCW by communication- but no complaints  Objective:   No results found. No results for input(s): WBC, HGB, HCT, PLT in the last 72 hours.   No results for input(s): NA, K, CL, CO2, GLUCOSE, BUN, CREATININE, CALCIUM in the last 72 hours.   Intake/Output Summary (Last 24 hours) at 06/05/2021 0755 Last data filed at 06/05/2021 0300 Gross per 24 hour  Intake 594 ml  Output 500 ml  Net 94 ml         Physical Exam: Vital Signs Blood pressure (!) 162/88, pulse (!) 49, temperature 97.7 F (36.5 C), temperature source Oral, resp. rate 14, height 6' (1.829 m), weight (!) 147.6 kg, SpO2 97 %.  General: No acute distress Mood and affect are appropriate Heart: Regular rate and rhythm no rubs murmurs or extra sounds Lungs: Clear to auscultation, breathing unlabored, no rales or wheezes Abdomen: Positive bowel sounds, soft nontender to palpation, nondistended Extremities: No clubbing, cyanosis, or edema Skin: No evidence of breakdown, no evidence of rash  Musculoskeletal:     Cervical back: Normal range of motion and neck supple.     Comments: No edema in extremities No pain to palpation Right great toe, no pain with ROM  Neurological:     Mental Status: He is alert and oriented to person, place, and time.     Comments: Alert Severe dysarthria Able to answer biographic questions.  He was able to follow simple motor commands.  Sensation diminished to light touch LLE Motor: RUE/RLE; 5/5 proximal to distal LUE: 4/5 proximal to distal LLE: 3-/5 proximal to distal - unchanged  Psychiatric:        Mood and Affect: Mood normal.        Behavior: Behavior normal.    Assessment/Plan: 1. Functional deficits which require 3+ hours per day of interdisciplinary therapy in a comprehensive inpatient rehab setting. Physiatrist is providing close team supervision  and 24 hour management of active medical problems listed below. Physiatrist and rehab team continue to assess barriers to discharge/monitor patient progress toward functional and medical goals  Care Tool:  Bathing    Body parts bathed by patient: Right arm, Left arm, Chest, Abdomen, Front perineal area, Right upper leg, Left upper leg, Face, Right lower leg, Left lower leg   Body parts bathed by helper: Buttocks, Right lower leg, Left lower leg     Bathing assist Assist Level: Minimal Assistance - Patient > 75%     Upper Body Dressing/Undressing Upper body dressing   What is the patient wearing?: Pull over shirt    Upper body assist Assist Level: Independent    Lower Body Dressing/Undressing Lower body dressing      What is the patient wearing?: Pants     Lower body assist Assist for lower body dressing: Supervision/Verbal cueing (EOB)     Toileting Toileting    Toileting assist Assist for toileting: Maximal Assistance - Patient 25 - 49%     Transfers Chair/bed transfer  Transfers assist  Chair/bed transfer activity did not occur: Refused  Chair/bed transfer assist level: Supervision/Verbal cueing Chair/bed transfer assistive device: Armrests, Environmental consultant  Locomotion Ambulation   Ambulation assist      Assist level: Supervision/Verbal cueing Assistive device: Walker-rolling Max distance: 150   Walk 10 feet activity   Assist     Assist level: Supervision/Verbal cueing Assistive device: Walker-rolling   Walk 50 feet activity   Assist Walk 50 feet with 2 turns activity did not occur: Safety/medical concerns  Assist level: Supervision/Verbal cueing Assistive device: Walker-rolling    Walk 150 feet activity   Assist Walk 150 feet activity did not occur: Safety/medical concerns  Assist level: Supervision/Verbal cueing      Walk 10 feet on uneven surface  activity   Assist Walk 10 feet on uneven surfaces activity did not occur:  Safety/medical concerns   Assist level: Minimal Assistance - Patient > 75% Assistive device: Walker-rolling   Wheelchair     Assist Is the patient using a wheelchair?: No Type of Wheelchair: Manual    Wheelchair assist level: Supervision/Verbal cueing Max wheelchair distance: 150    Wheelchair 50 feet with 2 turns activity    Assist        Assist Level: Supervision/Verbal cueing   Wheelchair 150 feet activity     Assist      Assist Level: Supervision/Verbal cueing   Blood pressure (!) 162/88, pulse (!) 49, temperature 97.7 F (36.5 C), temperature source Oral, resp. rate 14, height 6' (1.829 m), weight (!) 147.6 kg, SpO2 97 %.    Medical Problem List and Plan: 1.  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            con't PT, OT and SLP-extended stay to work on BP, treat acute gout attack as well as monitor renal fxn off of IVF, hopefully upgrade liquids and monitor for aspiration  2.  Impaired mobility: continue Lovenox. Dopplers reviewed and negative for clots.              -antiplatelet therapy: DAPT x1 month followed by Plavix alone.11/17 3. Pain Management: Tylenol as needed.Thalamic pain , LLE start gabapentin 100mg  Qhs may need to titrate up 4. Mood: LCSW to follow for evaluation and support.             -antipsychotic agents: N/A 5. Neuropsych: This patient is not capable of making decisions on his own behalf. 6. Skin/Wound Care: Routine pressure-relief measures. 7. Fluids/Electrolytes/Nutrition: Will monitor intake/output.              CMP ordered for tomorrow AM             -- Will need assistance/encouragement for intake. 8.  HTN: Monitor blood pressures 3 times daily 5-7 days.              -was on  Toprol XL 100mg  , Clonidine 0.1mg  BID PTA, will resume clonidine             Monitor with increased exertion  -increase amlodipine to 10mg    Discontinue hydralazine 10mg  TID, patient back on clonidine  Vitals:   06/04/21 1956 06/05/21 0405  BP:  (!) 162/88  Pulse: (!) 58 (!) 49  Resp:  14  Temp:  97.7 F (36.5 C)  SpO2:  97%  Elevated  11/1 increased clonidine to TID- still elevated increase to .2mg  Continue metoprolol tartrate 12.5mg  BID,       will need to monitor for bradycardia.    9.  T2DM with hyperglycemia: Hgb A1C- 6.6.  Was on metformin prior to admission which is being held due to CKD?             -- Monitor blood sugars achs and use SSI for elevated BS         CBG (last 3)  Recent Labs    06/04/21 1647 06/04/21 2143 06/05/21 0611  GLUCAP 196* 230* 99   Pm elevation on steroids , cont to wean, should improve  10.  CKD stage IIIb: Serum creatinine 1.92 range per records., 2.17 on 10/31 slightly above baseline              -- d/c IVF Recheck BMET Monday  cont to monitor off IVF   11.  Possible HCOM: Severe asymmetric LV septal hypertrophy per echo             -- Will need referral to cardiology at discharge 12. 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 Will need assistance with fluid intake.  Repeat swallow eval today  13. Dyslipidemia: LDL 139. On Crestor.  14. Morbid obesity: Educate on importance of diet/wt loss to promote health and mobility.   15.BPH?: Indicating difficulty with voiding--will resume Proscar and Flomax.  --toilet every 3-4 hours/assist with urinal 16. Assisted, witnessed fall: patient assessed and without any negative consequences of fall.  17. Bradycardia: monitor on BB on reduced dose compared to home dose we will place parameters  10/30- holding B blocker sometimes due to parameters/low HR- con't regimen  18.  Right great toe pain xray shows mild degenerative changes, serum urate is normal , clinically  c/w gout, has had episodes at home in the past , will start to wean  prednisone 20mg  daily 11/7, pred 10mg  on 11/8 then d/c , monitor CBG  LOS: 16 days A FACE TO Cleona E Mikah Poss 06/05/2021, 7:55 AM

## 2021-06-06 DIAGNOSIS — M109 Gout, unspecified: Secondary | ICD-10-CM

## 2021-06-06 DIAGNOSIS — E1169 Type 2 diabetes mellitus with other specified complication: Secondary | ICD-10-CM

## 2021-06-06 DIAGNOSIS — E669 Obesity, unspecified: Secondary | ICD-10-CM

## 2021-06-06 DIAGNOSIS — I10 Essential (primary) hypertension: Secondary | ICD-10-CM

## 2021-06-06 LAB — BASIC METABOLIC PANEL
Anion gap: 5 (ref 5–15)
BUN: 33 mg/dL — ABNORMAL HIGH (ref 6–20)
CO2: 28 mmol/L (ref 22–32)
Calcium: 8.6 mg/dL — ABNORMAL LOW (ref 8.9–10.3)
Chloride: 104 mmol/L (ref 98–111)
Creatinine, Ser: 2.15 mg/dL — ABNORMAL HIGH (ref 0.61–1.24)
GFR, Estimated: 36 mL/min — ABNORMAL LOW (ref >60)
Glucose, Bld: 124 mg/dL — ABNORMAL HIGH (ref 70–99)
Potassium: 4 mmol/L (ref 3.5–5.1)
Sodium: 137 mmol/L (ref 135–145)

## 2021-06-06 LAB — CBC
HCT: 37.1 % — ABNORMAL LOW (ref 39.0–52.0)
Hemoglobin: 11.7 g/dL — ABNORMAL LOW (ref 13.0–17.0)
MCH: 24.2 pg — ABNORMAL LOW (ref 26.0–34.0)
MCHC: 31.5 g/dL (ref 30.0–36.0)
MCV: 76.7 fL — ABNORMAL LOW (ref 80.0–100.0)
Platelets: 397 10*3/uL (ref 150–400)
RBC: 4.84 MIL/uL (ref 4.22–5.81)
RDW: 14.4 % (ref 11.5–15.5)
WBC: 12.5 10*3/uL — ABNORMAL HIGH (ref 4.0–10.5)
nRBC: 0 % (ref 0.0–0.2)

## 2021-06-06 LAB — GLUCOSE, CAPILLARY
Glucose-Capillary: 116 mg/dL — ABNORMAL HIGH (ref 70–99)
Glucose-Capillary: 162 mg/dL — ABNORMAL HIGH (ref 70–99)
Glucose-Capillary: 182 mg/dL — ABNORMAL HIGH (ref 70–99)
Glucose-Capillary: 198 mg/dL — ABNORMAL HIGH (ref 70–99)

## 2021-06-06 MED ORDER — PREDNISONE 10 MG PO TABS
10.0000 mg | ORAL_TABLET | Freq: Every day | ORAL | Status: AC
  Administered 2021-06-07: 10 mg via ORAL
  Filled 2021-06-06: qty 1

## 2021-06-06 MED ORDER — ACETAMINOPHEN 325 MG PO TABS: 325.0000 mg | ORAL_TABLET | ORAL | Status: DC | PRN

## 2021-06-06 NOTE — Progress Notes (Signed)
Inpatient Rehabilitation Care Coordinator Discharge Note   Patient Details  Name: MARVEL MCPHILLIPS MRN: [id number] Date of Birth: 13-Jun-1966   Discharge location: Home  Length of Stay: 18 Days  Discharge activity level: ambulatory level Supervision  Home/community participation: mother and father assisting at home  Patient response JI:RCVELF Literacy - How often do you need to have someone help you when you read instructions, pamphlets, or other written material from your doctor or pharmacy?: Patient unable to respond  Patient response YB:OFBPZW Isolation - How often do you feel lonely or isolated from those around you?: Patient unable to respond  Services provided included: SW, Neuropsych, Pharmacy, CM, TR, RN, SLP, PT, OT, RD, MD  Financial Services:  Financial Services Utilized: Private Insurance [employer]  Choices offered to/list presented to: pt and mother  Follow-up services arranged:  Home Health [facility] Agency: [facility]         Patient response to transportation need: Is the patient able to respond to transportation needs?: No In the past 12 months, has lack of transportation kept you from medical appointments or from getting medications?: No In the past 12 months, has lack of transportation kept you from meetings, work, or from getting things needed for daily living?: No    Comments (or additional information):  Patient/Family verbalized understanding of follow-up arrangements:  Yes  Individual responsible for coordination of the follow-up plan: Greggory Stallion [phone]  Confirmed correct DME delivered: Andria Rhein 06/06/2021    Andria Rhein

## 2021-06-06 NOTE — Progress Notes (Signed)
Inpatient Rehabilitation Discharge Medication Review by a Pharmacist  A complete drug regimen review was completed for this patient to identify any potential clinically significant medication issues.  High Risk Drug Classes Is patient taking? Indication by Medication  Antipsychotic No   Anticoagulant No   Antibiotic No   Opioid No   Antiplatelet Yes Plavix / Aspirin for CVA  Hypoglycemics/insulin No   Vasoactive Medication Yes Amlodipine, clonidine, metoprolol for BP  Chemotherapy No   Other No      Type of Medication Issue Identified Description of Issue Recommendation(s)  Drug Interaction(s) (clinically significant)     Duplicate Therapy     Allergy     No Medication Administration End Date     Incorrect Dose     Additional Drug Therapy Needed     Significant med changes from prior encounter (inform family/care partners about these prior to discharge).    Other       Clinically significant medication issues were identified that warrant physician communication and completion of prescribed/recommended actions by midnight of the next day:  No  Pharmacist comments: None  Time spent performing this drug regimen review (minutes):  20 minutes   Elwin Sleight 06/06/2021 7:48 AM

## 2021-06-06 NOTE — Progress Notes (Signed)
Occupational Therapy Session Note  Patient Details  Name: Brandon French MRN: [id number] Date of Birth: 1965-11-11  Today's Date: 06/06/2021 OT Individual Time: 1000-1100 OT Individual Time Calculation (min): 60 min    Short Term Goals: Week 3:  OT Short Term Goal 1 (Week 3): STG = LTG 2/2 ELOS  Skilled Therapeutic Interventions/Progress Updates:    Pt seen this session for working on self care and mobility. He was able to sit to stand to RW with supervision and hold stand balance with reaching to knees/hips during self care. He did need more A with LB dressing than he usually does because he was out of his regular clothing and had to use the scrub clothing that does not stretch well so he needed min a to start over his feet but then he could pull over his hips.    Pt then worked on transfers to various surfaces using RW with close S.  He worked on dynamic stand balance of reaching to knees and back of hips without UE support.  Able to complete partial stand to squats 10x with hands on knees.   Seated LUE NMR with use of 4# dowel bar for AArom using R arm to help L arm achieve full AROM.   Used 5# hand wt for forearm pron to supination and bicep curls.  Pt resting in wc with all needs met.    Therapy Documentation Precautions:  Precautions Precautions: Fall Precaution Comments: dysphagia/dysarthria Restrictions Weight Bearing Restrictions: Yes  Pain:  No c/o pain  ADL: ADL Eating: Supervision/safety Where Assessed-Eating: Wheelchair Grooming: Modified independent Where Assessed-Grooming: Sitting at sink Upper Body Bathing: Independent Where Assessed-Upper Body Bathing: Shower Lower Body Bathing: Minimal assistance Where Assessed-Lower Body Bathing: Shower Upper Body Dressing: Independent Where Assessed-Upper Body Dressing: Edge of bed Lower Body Dressing: Supervision/safety Where Assessed-Lower Body Dressing: Edge of bed Toileting: Minimal assistance Where  Assessed-Toileting: Glass blower/designer: Close supervision Armed forces technical officer Method: Counselling psychologist: Raised toilet seat, Grab bars Tub/Shower Transfer: Close supervison Clinical cytogeneticist Method: Optometrist: Facilities manager: Close supervision Social research officer, government Method: Ambulating   Therapy/Group: Individual Therapy  [place] 06/06/2021, 1:09 PM

## 2021-06-06 NOTE — Progress Notes (Signed)
Physical Therapy Session Note  Patient Details  Name: Brandon French MRN: [id number] Date of Birth: 01-17-1966  Today's Date: 06/06/2021 PT Individual Time: 0800-0909 PT Individual Time Calculation (min): 69 min   Short Term Goals: Week 1:  PT Short Term Goal 1 (Week 1): Patient will complete be dmobility with CGA consistently PT Short Term Goal 1 - Progress (Week 1): Met PT Short Term Goal 2 (Week 1): Patient will transfer with LRAD bed <> wc with CGA consistently PT Short Term Goal 2 - Progress (Week 1): Met PT Short Term Goal 3 (Week 1): Patient will ambulate >50 with LRAD with CGA consistently PT Short Term Goal 3 - Progress (Week 1): Met Week 2:  PT Short Term Goal 1 (Week 2): STG = LTG d/t ELOS PT Short Term Goal 1 - Progress (Week 2): Progressing toward goal Week 3:  PT Short Term Goal 1 (Week 3): STG=LTG due to ELOS  Skilled Therapeutic Interventions/Progress Updates:  Pt received supine in bed, denied pain and requested to use bathroom. Pt performed supine <>sit w/S* and sit <>stand from elevated bed w/CGA. Pt ambulated 10' to toilet w/RW and CGA and doffed briefs independently. Pt voided incontinently in floor and in toilet and had BM continently, soft in consistency. Changed briefs w/total A and performed sit <>stand from [facility] w/CGA. Therapist performed peri care w/total A and pt urinated on floor and clean brief. Pt ambulated 10' to bed w/RW and CGA and doffed/donned another clean brief w/total A. UE/LE dressing performed w/max A and donned clean socks w/total A at EOB. Pt requested to eat breakfast at EOB, provided S* and discussed DC plans and next steps for therapy. Pt verbalized excitement regarding going home and discussed continuation of therapy to return to baseline function. Pt performed sit <>stand pivot from EOB to [facility] w/CGA and RW and was transported to 1st floor outdoor patio w/total A for time management. Pt requested to sit out in sun for a while for mental health and  improved mood. Educated pt on safety upon going home and exercises that are safe to perform, pt able to teach back. Pt transported back to room w/total A and was left seated in WC in room, all needs in reach.   Therapy Documentation Precautions:  Precautions Precautions: Fall Precaution Comments: dysphagia/dysarthria Restrictions Weight Bearing Restrictions: Yes   Therapy/Group: Individual Therapy Cruzita Lederer Venesha Petraitis, PT, DPT  06/06/2021, 7:55 AM

## 2021-06-06 NOTE — Plan of Care (Signed)
  Problem: RH Balance Goal: LTG: Patient will maintain dynamic sitting balance (OT) Description: LTG:  Patient will maintain dynamic sitting balance with assistance during activities of daily living (OT) Outcome: Completed/Met Goal: LTG Patient will maintain dynamic standing with ADLs (OT) Description: LTG:  Patient will maintain dynamic standing balance with assist during activities of daily living (OT)  Outcome: Completed/Met   Problem: RH Grooming Goal: LTG Patient will perform grooming w/assist,cues/equip (OT) Description: LTG: Patient will perform grooming with assist, with/without cues using equipment (OT) Outcome: Completed/Met   Problem: RH Dressing Goal: LTG Patient will perform upper body dressing (OT) Description: LTG Patient will perform upper body dressing with assist, with/without cues (OT). Outcome: Completed/Met Goal: LTG Patient will perform lower body dressing w/assist (OT) Description: LTG: Patient will perform lower body dressing with assist, with/without cues in positioning using equipment (OT) Outcome: Completed/Met   Problem: RH Toilet Transfers Goal: LTG Patient will perform toilet transfers w/assist (OT) Description: LTG: Patient will perform toilet transfers with assist, with/without cues using equipment (OT) Outcome: Completed/Met   Problem: RH Tub/Shower Transfers Goal: LTG Patient will perform tub/shower transfers w/assist (OT) Description: LTG: Patient will perform tub/shower transfers with assist, with/without cues using equipment (OT) Outcome: Completed/Met Note: Not met due to requiring assist for balance during pericare in standing/seated on TTB.   Problem: RH Memory Goal: LTG Patient will demonstrate ability for day to day recall/carry over during activities of daily living with assistance level (OT) Description: LTG:  Patient will demonstrate ability for day to day recall/carry over during activities of daily living with assistance level  (OT). Outcome: Completed/Met

## 2021-06-06 NOTE — Plan of Care (Signed)
  Problem: RH Bathing Goal: LTG Patient will bathe all body parts with assist levels (OT) Description: LTG: Patient will bathe all body parts with assist levels (OT) Outcome: Not Met (add Reason)   Problem: RH Toileting Goal: LTG Patient will perform toileting task (3/3 steps) with assistance level (OT) Description: LTG: Patient will perform toileting task (3/3 steps) with assistance level (OT)  Outcome: Not Met (add Reason) Note: Not met due to frequent incontinence episodes.

## 2021-06-06 NOTE — Progress Notes (Signed)
Patient ID: Brandon French, male   DOB: May 13, 1966, 55 y.o.   MRN: [id number] Follow up with the patient regarding pending discharge. Patient noted he is ready for discharge and feels prepared to manage care. Reported he and his mother will manage medications and used to a HH diet. Going home on a D1 Honey thick liquid diet. Reviewed thickened liquids available in stores and need to obtain thickener for all liquids from the pharmacy when picking up medications. No other concerns noted. Pamelia Hoit

## 2021-06-06 NOTE — Progress Notes (Signed)
Patient ID: Brandon French, male   DOB: 06-08-1966, 55 y.o.   MRN: [id number]  Patient will discharge tomorrow due to father's scheduled HD today.  [place], [place] [phone]

## 2021-06-06 NOTE — Discharge Instructions (Addendum)
Inpatient Rehab Discharge Instructions  ELDRA WORD Discharge date and time: 06/07/21   Activities/Precautions/ Functional Status: Activity: no lifting, driving, or strenuous exercise till CLEARED BY MD Diet: pureed foods--diabetic restrictions. Liquids have to be thickened to honey consistency Wound Care: none needed   Functional status:  ___ No restrictions     ___ Walk up steps independently _X__ 24/7 supervision/assistance   ___ Walk up steps with assistance ___ Intermittent supervision/assistance  ___ Bathe/dress independently ___ Walk with walker     ___ Bathe/dress with assistance ___ Walk Independently    ___ Shower independently ___ Walk with assistance    _X__ Shower with assistance __X_ No alcohol     ___ Return to work/school ________   Special Instructions: Call Medicaid/DSS today to change your doctor/find who is accepting patients in town.  Contact [facility] health department for medical follow up till you can be set with primary care provider. You need to have primary care to follow up on labs in 1-2 weeks.    COMMUNITY REFERRALS UPON DISCHARGE:    Home Health:   PT     OT     ST                   Agency: [facility]  Phone: [phone]   Medical Equipment/Items Ordered: Bari rolling Walking, Suction, Tub Advertising copywriter and Bedside Commode                                                 Agency/Supplier: [employer]    STROKE/TIA DISCHARGE INSTRUCTIONS SMOKING Cigarette smoking nearly doubles your risk of having a stroke & is the single most alterable risk factor  If you smoke or have smoked in the last 12 months, you are advised to quit smoking for your health. Most of the excess cardiovascular risk related to smoking disappears within a year of stopping. Ask you doctor about anti-smoking medications [place] Quit Line: 1-800-QUIT NOW Free Smoking Cessation Classes [phone]  CHOLESTEROL Know your levels; limit fat & cholesterol in your diet  Lipid Panel      Component Value Date/Time   CHOL 191 05/17/2021 0408   TRIG 61 05/17/2021 0408   HDL 40 (L) 05/17/2021 0408   CHOLHDL 4.8 05/17/2021 0408   VLDL 12 05/17/2021 0408   LDLCALC 139 (H) 05/17/2021 0408     Many patients benefit from treatment even if their cholesterol is at goal. Goal: Total Cholesterol (CHOL) less than 160 Goal:  Triglycerides (TRIG) less than 150 Goal:  HDL greater than 40 Goal:  LDL (LDLCALC) less than 100   BLOOD PRESSURE American Stroke Association blood pressure target is less that 120/80 mm/Hg  Your discharge blood pressure is:  BP: (!) 147/79 Monitor your blood pressure Limit your salt and alcohol intake Many individuals will require more than one medication for high blood pressure  DIABETES (A1c is a blood sugar average for last 3 months) Goal HGBA1c is under 7% (HBGA1c is blood sugar average for last 3 months)  Diabetes:     Lab Results  Component Value Date   HGBA1C 6.6 (H) 05/17/2021    Your HGBA1c can be lowered with medications, healthy diet, and exercise. Check your blood sugar as directed by your physician Call your physician if you experience unexplained or low blood sugars.  PHYSICAL ACTIVITY/REHABILITATION Goal is  30 minutes at least 4 days per week  Activity: No driving, Therapies: see above Return to work: N/A Activity decreases your risk of heart attack and stroke and makes your heart stronger.  It helps control your weight and blood pressure; helps you relax and can improve your mood. Participate in a regular exercise program. Talk with your doctor about the best form of exercise for you (dancing, walking, swimming, cycling).  DIET/WEIGHT Goal is to maintain a healthy weight  Your discharge diet is:  Diet Order             DIET - DYS 1 Room service appropriate? Yes; Fluid consistency: Honey Thick  Diet effective now                  Honey thick  liquids Your height is:  Height: 6' (182.9 cm) Your current weight is: Weight 325 lbs   Your Body Mass Index (BMI) is:  BMI (Calculated): 44.12 Following the type of diet specifically designed for you will help prevent another stroke. Your goal weight is  184 lbs Your goal Body Mass Index (BMI) is 19-24. Healthy food habits can help reduce 3 risk factors for stroke:  High cholesterol, hypertension, and excess weight.  RESOURCES Stroke/Support Group:  Call [phone]   STROKE EDUCATION PROVIDED/REVIEWED AND GIVEN TO PATIENT Stroke warning signs and symptoms How to activate emergency medical system (call 911). Medications prescribed at discharge. Need for follow-up after discharge. Personal risk factors for stroke. Pneumonia vaccine given:  Flu vaccine given: My questions have been answered, the writing is legible, and I understand these instructions.  I will adhere to these goals & educational materials that have been provided to me after my discharge from the hospital.      My questions have been answered and I understand these instructions. I will adhere to these goals and the provided educational materials after my discharge from the hospital.  Patient/Caregiver Signature _______________________________ Date __________  Clinician Signature _______________________________________ Date __________  Please bring this form and your medication list with you to all your follow-up doctor's appointments.

## 2021-06-06 NOTE — Plan of Care (Signed)
  Problem: RH Swallowing Goal: LTG Patient will consume least restrictive diet using compensatory strategies with assistance (SLP) Description: LTG:  Patient will consume least restrictive diet using compensatory strategies with assistance (SLP) Outcome: Completed/Met Goal: LTG Patient will participate in dysphagia therapy to increase swallow function with assistance (SLP) Description: LTG:  Patient will participate in dysphagia therapy to increase swallow function with assistance (SLP) Outcome: Completed/Met Goal: LTG Pt will demonstrate functional change in swallow as evidenced by bedside/clinical objective assessment (SLP) Description: LTG: Patient will demonstrate functional change in swallow as evidenced by bedside/clinical objective assessment (SLP) Outcome: Completed/Met   Problem: RH Expression Communication Goal: LTG Patient will increase speech intelligibility (SLP) Description: LTG: Patient will increase speech intelligibility at word/phrase/conversation level with cues, % of the time (SLP) Outcome: Completed/Met

## 2021-06-06 NOTE — Progress Notes (Signed)
Speech Language Pathology Discharge Summary  Patient Details  Name: Brandon French MRN: [id number] Date of Birth: 1966/03/20  Patient has met 4 of 4 long term goals.  Patient to discharge at overall Supervision;Mod level.  Reasons goals not met: NA   Clinical Impression/Discharge Summary:  Patient has made slow yet functional progress and has met 4 of 4 long-term goals this admission due to improved implementation of speech intelligibility strategies to communicate functional needs, and oropharyngeal swallow function. Patient is currently an overall mod A for speech tasks including utilization of speech intelligibility strategies at word and phrase level. For swallowing, pt demonstrates functional improvement with oral phase as evidenced by repeat MBS obtained on 06/02/2021. Patient is currently consuming Dys 1 diet and honey thick liquids and requires sup A for implementation of safe swallow precautions and strategies. Patient and family education is complete and patient to discharge at overall sup level for swallowing, and mod A level for speech. Patient's care partner is independent to provide the necessary cognitive assistance at discharge. Patient would benefit from continued SLP services to maximize speech and swallowing function and functional independence.    Care Partner:  Caregiver Able to Provide Assistance: Yes  Type of Caregiver Assistance: Cognitive  Recommendation:  Home Health SLP;Outpatient SLP  Rationale for SLP Follow Up: Maximize functional communication;Maximize swallowing safety   Equipment: None   Reasons for discharge: Discharged from hospital;Treatment goals met   Patient/Family Agrees with Progress Made and Goals Achieved: Yes    Jelene Albano T Aarica Wax 06/06/2021, 12:55 PM

## 2021-06-06 NOTE — Progress Notes (Signed)
PROGRESS NOTE   Subjective/Complaints: Didn't sleep that well but otherwise no new issues.   ROS: Patient denies fever, rash, sore throat, blurred vision, nausea, vomiting, diarrhea, cough, shortness of breath or chest pain,  headache, or mood change.   Objective:   No results found. Recent Labs    06/06/21 0523  WBC 12.5*  HGB 11.7*  HCT 37.1*  PLT 397    Recent Labs    06/06/21 0523  NA 137  K 4.0  CL 104  CO2 28  GLUCOSE 124*  BUN 33*  CREATININE 2.15*  CALCIUM 8.6*    Intake/Output Summary (Last 24 hours) at 06/06/2021 1005 Last data filed at 06/06/2021 0900 Gross per 24 hour  Intake 1129 ml  Output 700 ml  Net 429 ml        Physical Exam: Vital Signs Blood pressure (!) 167/93, pulse (!) 49, temperature 97.8 F (36.6 C), temperature source Oral, resp. rate 18, height 6' (1.829 m), weight (!) 147.6 kg, SpO2 100 %.  Constitutional: No distress . Vital signs reviewed. HEENT: NCAT, EOMI, oral membranes moist Neck: supple Cardiovascular: RRR without murmur. No JVD    Respiratory/Chest: CTA Bilaterally without wheezes or rales. Normal effort    GI/Abdomen: BS +, non-tender, non-distended Ext: no clubbing, cyanosis, or edema Psych: pleasant and cooperative  Skin: No evidence of breakdown, no evidence of rash  Musculoskeletal:     Cervical back: Normal range of motion and neck supple.     Comments: No edema in extremities No pain to palpation Right great toe, no pain with ROM  Neurological:     Mental Status: He is alert and oriented to person, place, and time.     Comments: Alert Severely dysarthric Able to answer biographic questions.  He was able to follow simple motor commands.  Sensation diminished to light touch LLE Motor: RUE/RLE; 5/5 proximal to distal LUE: 4/5 proximal to distal LLE: 3-/5 proximal to distal - stable      Assessment/Plan: 1. Functional deficits which require 3+  hours per day of interdisciplinary therapy in a comprehensive inpatient rehab setting. Physiatrist is providing close team supervision and 24 hour management of active medical problems listed below. Physiatrist and rehab team continue to assess barriers to discharge/monitor patient progress toward functional and medical goals  Care Tool:  Bathing    Body parts bathed by patient: Right arm, Left arm, Chest, Abdomen, Front perineal area, Right upper leg, Left upper leg, Face, Right lower leg, Left lower leg   Body parts bathed by helper: Buttocks     Bathing assist Assist Level: Minimal Assistance - Patient > 75%     Upper Body Dressing/Undressing Upper body dressing   What is the patient wearing?: Pull over shirt    Upper body assist Assist Level: Independent    Lower Body Dressing/Undressing Lower body dressing      What is the patient wearing?: Pants, Incontinence brief     Lower body assist Assist for lower body dressing: Minimal Assistance - Patient > 75%     Toileting Toileting    Toileting assist Assist for toileting: Maximal Assistance - Patient 25 - 49%  Transfers Chair/bed transfer  Transfers assist  Chair/bed transfer activity did not occur: Refused  Chair/bed transfer assist level: Supervision/Verbal cueing Chair/bed transfer assistive device: Arboriculturist assist      Assist level: Supervision/Verbal cueing Assistive device: Walker-rolling Max distance: 150   Walk 10 feet activity   Assist     Assist level: Supervision/Verbal cueing Assistive device: Walker-rolling   Walk 50 feet activity   Assist Walk 50 feet with 2 turns activity did not occur: Safety/medical concerns  Assist level: Supervision/Verbal cueing Assistive device: Walker-rolling    Walk 150 feet activity   Assist Walk 150 feet activity did not occur: Safety/medical concerns  Assist level: Supervision/Verbal cueing Assistive  device: Walker-rolling    Walk 10 feet on uneven surface  activity   Assist Walk 10 feet on uneven surfaces activity did not occur: Safety/medical concerns   Assist level: Minimal Assistance - Patient > 75% Assistive device: Development worker, international aid     Assist Is the patient using a wheelchair?: No Type of Wheelchair: Manual    Wheelchair assist level: Supervision/Verbal cueing Max wheelchair distance: 150    Wheelchair 50 feet with 2 turns activity    Assist        Assist Level: Supervision/Verbal cueing   Wheelchair 150 feet activity     Assist      Assist Level: Supervision/Verbal cueing   Blood pressure (!) 167/93, pulse (!) 49, temperature 97.8 F (36.6 C), temperature source Oral, resp. rate 18, height 6' (1.829 m), weight (!) 147.6 kg, SpO2 100 %.    Medical Problem List and Plan: 1.  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 Home with father tomorrow as father has HD today            finalize dc planning 2.  Impaired mobility: continue Lovenox. Dopplers reviewed and negative for clots.              -antiplatelet therapy: DAPT x1 month followed by Plavix alone.11/17 3. Pain Management: Tylenol as needed.Thalamic pain , LLE start gabapentin 100mg  Qhs may need to titrate up 4. Mood: LCSW to follow for evaluation and support.             -antipsychotic agents: N/A 5. Neuropsych: This patient is not capable of making decisions on his own behalf. 6. Skin/Wound Care: Routine pressure-relief measures. 7. Fluids/Electrolytes/Nutrition: encouraging po                            8.  HTN: Monitor blood pressures 3 times daily 5-7 days.              -was on Toprol XL 100mg  , Clonidine 0.1mg  BID PTA, will  resume clonidine             Monitor with increased exertion  -increase amlodipine to 10mg    Discontinue hydralazine 10mg  TID, patient back on clonidine  Vitals:   06/05/21 1933 06/06/21 0533  BP: 133/67 (!) 167/93  Pulse: (!) 54 (!) 49  Resp: 18 18  Temp: 98 F (36.7 C) 97.8 F (36.6 C)  SpO2: 94%  100%   increased clonidine to TID- still elevated increase to .2mg  Continue metoprolol tartrate 12.5mg  BID, HR low at times 11/7-borderline this am. No changes today    9.  T2DM with hyperglycemia: Hgb A1C- 6.6.  Was on metformin prior to admission which is being held due to CKD?             -- Monitor blood sugars achs and use SSI for elevated BS         CBG (last 3)  Recent Labs    06/05/21 1658 06/05/21 2116 06/06/21 0559  GLUCAP 204* 229* 116*    elevation on steroids , improving with steroid wean  10.  CKD stage IIIb: Serum creatinine 1.92 range per records., 2.17 on 10/31 slightly above baseline -essentially no change 11/7             -- off IVF    11.  Possible HCOM: Severe asymmetric LV septal hypertrophy per echo             -- Will need referral to cardiology at discharge 12. 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 Dyslipidemia: LDL 139. On Crestor.  14. Morbid obesity: Educate on importance of diet/wt loss to promote health and mobility.   15.BPH?: Indicating difficulty with voiding--will resume Proscar and Flomax.  --toilet every 3-4 hours/assist with urinal 16. Assisted, witnessed fall: patient assessed and without any negative consequences of fall.  17. Bradycardia: monitor on BB on reduced dose compared to home dose we will place parameters  10/30- holding B blocker sometimes due to parameters/low HR- con't regimen  18.  Right great toe pain xray shows mild degenerative changes, serum urate is normal , clinically c/w gout, has had episodes at home in the past , will start to wean  prednisone 20mg  daily 11/7, pred 10mg  on 11/8 then d/c     LOS: 17 days A FACE TO [place] 06/06/2021, 10:05 AM

## 2021-06-07 DIAGNOSIS — I639 Cerebral infarction, unspecified: Secondary | ICD-10-CM

## 2021-06-07 LAB — GLUCOSE, CAPILLARY: Glucose-Capillary: 117 mg/dL — ABNORMAL HIGH (ref 70–99)

## 2021-06-07 MED ORDER — SENNOSIDES-DOCUSATE SODIUM 8.6-50 MG PO TABS: 2.0000 | ORAL_TABLET | Freq: Two times a day (BID) | ORAL | 0 refills | Status: DC

## 2021-06-07 MED ORDER — ATORVASTATIN CALCIUM 40 MG PO TABS: 40.0000 mg | ORAL_TABLET | Freq: Every day | ORAL | 1 refills | Status: DC

## 2021-06-07 MED ORDER — TRAZODONE HCL 50 MG PO TABS: 50.0000 mg | ORAL_TABLET | Freq: Every day | ORAL | 1 refills | Status: DC

## 2021-06-07 MED ORDER — ASPIRIN 81 MG PO TBEC: 81.0000 mg | DELAYED_RELEASE_TABLET | Freq: Every day | ORAL | 0 refills | Status: DC

## 2021-06-07 MED ORDER — CLONIDINE HCL 0.2 MG PO TABS: 0.2000 mg | ORAL_TABLET | Freq: Three times a day (TID) | ORAL | 1 refills | Status: DC

## 2021-06-07 MED ORDER — FINASTERIDE 5 MG PO TABS: 5.0000 mg | ORAL_TABLET | Freq: Every day | ORAL | 1 refills | Status: DC

## 2021-06-07 MED ORDER — TAMSULOSIN HCL 0.4 MG PO CAPS: 0.4000 mg | ORAL_CAPSULE | Freq: Every day | ORAL | 1 refills | Status: DC

## 2021-06-07 MED ORDER — DICLOFENAC SODIUM 1 % EX GEL: 2.0000 g | Freq: Four times a day (QID) | CUTANEOUS | 1 refills | Status: DC

## 2021-06-07 MED ORDER — FOOD THICKENER (SIMPLYTHICK HONEY)
ORAL | 1 refills | Status: DC
Start: 2021-06-07 — End: 2022-05-02

## 2021-06-07 MED ORDER — AMLODIPINE BESYLATE 10 MG PO TABS: 10.0000 mg | ORAL_TABLET | Freq: Every day | ORAL | 1 refills | Status: DC

## 2021-06-07 MED ORDER — GABAPENTIN 100 MG PO CAPS: 100.0000 mg | ORAL_CAPSULE | Freq: Every day | ORAL | 1 refills | Status: DC

## 2021-06-07 MED ORDER — CLOPIDOGREL BISULFATE 75 MG PO TABS: 75.0000 mg | ORAL_TABLET | Freq: Every day | ORAL | 1 refills | Status: DC

## 2021-06-07 NOTE — Progress Notes (Signed)
PROGRESS NOTE   Subjective/Complaints:   ROS: Patient denies CP, SOB, N/V/D  Objective:   No results found. Recent Labs    06/06/21 0523  WBC 12.5*  HGB 11.7*  HCT 37.1*  PLT 397     Recent Labs    06/06/21 0523  NA 137  K 4.0  CL 104  CO2 28  GLUCOSE 124*  BUN 33*  CREATININE 2.15*  CALCIUM 8.6*     Intake/Output Summary (Last 24 hours) at 06/07/2021 9563 Last data filed at 06/06/2021 2200 Gross per 24 hour  Intake 649 ml  Output 350 ml  Net 299 ml         Physical Exam: Vital Signs Blood pressure (!) 147/79, pulse (!) 45, temperature 97.8 F (36.6 C), temperature source Oral, resp. rate 16, height 6' (1.829 m), weight (!) 147.6 kg, SpO2 100 %.   General: No acute distress Mood and affect are appropriate Heart: Regular rate and rhythm no rubs murmurs or extra sounds Lungs: Clear to auscultation, breathing unlabored, no rales or wheezes Abdomen: Positive bowel sounds, soft nontender to palpation, nondistended Extremities: No clubbing, cyanosis, or edema    Musculoskeletal:       Comments: No edema in extremities No pain to palpation Right great toe, no pain with ROM  Neurological:     Mental Status: He is alert and oriented to person, place, and time.     Comments: Alert Severely dysarthric Able to answer biographic questions.  He was able to follow simple motor commands.  Sensation diminished to light touch LLE Motor: RUE/RLE; 5/5 proximal to distal LUE: 4/5 proximal to distal LLE: 3-/5 proximal to distal - stable      Assessment/Plan: 1. Functional deficits due to new Left CVA with residual L HP from prior CVA  Stable for D/C today F/u PCP in 3-4 weeks F/u PM&R 2 weeks See D/C summary See D/C instructions   Care Tool:  Bathing    Body parts bathed by patient: Right arm, Left arm, Chest, Abdomen, Front perineal area, Right upper leg, Left upper leg, Face, Right lower leg,  Left lower leg   Body parts bathed by helper: Buttocks     Bathing assist Assist Level: Minimal Assistance - Patient > 75%     Upper Body Dressing/Undressing Upper body dressing   What is the patient wearing?: Pull over shirt    Upper body assist Assist Level: Independent    Lower Body Dressing/Undressing Lower body dressing      What is the patient wearing?: Pants, Incontinence brief     Lower body assist Assist for lower body dressing: Minimal Assistance - Patient > 75%     Toileting Toileting    Toileting assist Assist for toileting: Minimal Assistance - Patient > 75%     Transfers Chair/bed transfer  Transfers assist  Chair/bed transfer activity did not occur: Refused  Chair/bed transfer assist level: Supervision/Verbal cueing Chair/bed transfer assistive device: Arboriculturist assist      Assist level: Supervision/Verbal cueing Assistive device: Walker-rolling Max distance: 150   Walk 10 feet activity   Assist     Assist level: Supervision/Verbal  cueing Assistive device: Walker-rolling   Walk 50 feet activity   Assist Walk 50 feet with 2 turns activity did not occur: Safety/medical concerns  Assist level: Supervision/Verbal cueing Assistive device: Walker-rolling    Walk 150 feet activity   Assist Walk 150 feet activity did not occur: Safety/medical concerns  Assist level: Supervision/Verbal cueing Assistive device: Walker-rolling    Walk 10 feet on uneven surface  activity   Assist Walk 10 feet on uneven surfaces activity did not occur: Safety/medical concerns   Assist level: Minimal Assistance - Patient > 75% Assistive device: Walker-rolling   Wheelchair     Assist Is the patient using a wheelchair?: No Type of Wheelchair: Manual    Wheelchair assist level: Supervision/Verbal cueing Max wheelchair distance: 150    Wheelchair 50 feet with 2 turns activity    Assist         Assist Level: Supervision/Verbal cueing   Wheelchair 150 feet activity     Assist      Assist Level: Supervision/Verbal cueing   Blood pressure (!) 147/79, pulse (!) 45, temperature 97.8 F (36.6 C), temperature source Oral, resp. rate 16, height 6' (1.829 m), weight (!) 147.6 kg, SpO2 100 %.    Medical Problem List and Plan: 1.  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  Impaired mobility: continue Lovenox. Dopplers reviewed and negative for clots.              -antiplatelet therapy: DAPT x1 month followed by Plavix alone.11/17 3. Pain Management: Tylenol as needed.Thalamic pain , LLE start gabapentin 100mg  Qhs may need to titrate up 4. Mood: LCSW to follow for evaluation and support.             -antipsychotic agents: N/A 5. Neuropsych: This patient is not capable of making decisions on his own behalf. 6. Skin/Wound Care: Routine pressure-relief measures. 7. Fluids/Electrolytes/Nutrition: encouraging po                            8.  HTN: Monitor blood pressures 3 times daily 5-7 days.              -was on Toprol XL 100mg  , Clonidine 0.1mg  BID PTA, will resume clonidine             Monitor with increased exertion  -increase amlodipine to 10mg    Discontinue hydralazine 10mg  TID, patient back on clonidine  Vitals:   06/06/21 2223 06/07/21 0435  BP: (!) 149/66 (!) 147/79  Pulse: (!) 47 (!) 45  Resp:  16  Temp:  97.8 F (36.6 C)  SpO2:  100%   increased clonidine to TID- still elevated increase to .2mg  Continue metoprolol tartrate 12.5mg  BID, HR low at times [id number] mild systolic elevation cont current meds except D/C metoprolol which has been held due to brady    9.  T2DM with hyperglycemia: Hgb A1C- 6.6.  Was on metformin prior to admission which is being held due to  CKD?             -- Monitor blood sugars achs and use SSI for elevated BS         CBG (last 3)  Recent Labs    06/06/21 1633 06/06/21 2119 06/07/21 0645  GLUCAP 182* 198* 117*     elevation on steroids , improving with steroid wean  10.  CKD stage IIIb: Serum creatinine 1.92 range per records., 2.17 on 10/31 slightly above baseline -essentially no change 11/7             -- off IVF- f/u PCP    11.  Possible HCOM: Severe asymmetric LV septal hypertrophy per echo             -- Will need referral to cardiology at discharge 12. 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 Dyslipidemia: LDL 139. On Crestor.  14. Morbid obesity: Educate on importance of diet/wt loss to promote health and mobility.   15.BPH?: Indicating difficulty with voiding--will resume Proscar and Flomax.  --toilet every 3-4 hours/assist with urinal 16. Assisted, witnessed fall: patient assessed and without any negative consequences of fall.  17. Bradycardia: monitor on BB on reduced dose compared to home dose we will place parameters- has been   Vitals:   06/06/21 2223 06/07/21 0435  BP: (!) 149/66 (!) 147/79  Pulse: (!) 47 (!) 45  Resp:  16  Temp:  97.8 F (36.6 C)  SpO2:  100%  Has been only given in am last 3 d may d/c   18.  Right great toe pain xray shows mild degenerative changes, serum urate is normal , clinically c/w gout, has had episodes at home in the past , will start to wean  prednisone 20mg  daily 11/7, pred 10mg  on 11/8 then d/c  19.  Leukocytosis- afebrile no symptoms of infection , + steroids cont wean and f/u CBC as OP   LOS: 18 days A FACE TO FACE EVALUATION WAS PERFORMED  Charlett Blake 06/07/2021, 7:22 AM

## 2021-06-07 NOTE — Progress Notes (Signed)
Patient reports that Dr. Margo Aye his PCP on medicaid card is not accepting patients. Informed patient and mother that they  need to call Medicaid/DSS TODAY to change PCP and find who is accepting patients in town. Also asked them to reach out to [facility] to see if they will see him till he is able to get PCP>

## 2021-06-07 NOTE — Plan of Care (Signed)
All goals met and discharged at this time. Patient discharged in care of family at this time. Handful of honey thickener packets  given to family to get started with education on usage till they can get some more.  Also gave the family and patient message from speech therapy on where to get more packets; from [facility], [facility] or online.

## 2021-06-08 ENCOUNTER — Other Ambulatory Visit: Payer: Self-pay

## 2021-06-08 DIAGNOSIS — I10 Essential (primary) hypertension: Secondary | ICD-10-CM

## 2021-06-08 DIAGNOSIS — I639 Cerebral infarction, unspecified: Secondary | ICD-10-CM

## 2021-06-08 NOTE — Patient Outreach (Signed)
Transition of Care Call will be done by Jacalyn Lefevre from [facility].

## 2021-06-09 DIAGNOSIS — I422 Other hypertrophic cardiomyopathy: Secondary | ICD-10-CM

## 2021-06-09 DIAGNOSIS — I421 Obstructive hypertrophic cardiomyopathy: Secondary | ICD-10-CM

## 2021-06-09 NOTE — Discharge Summary (Signed)
Physician Discharge Summary  Patient ID: Brandon French MRN: [id number] DOB/AGE: 08/06/1965 55 y.o.  Admit date: 05/20/2021 Discharge date: 06/07/2021  Discharge Diagnoses:  Principal Problem:   CVA (cerebral vascular accident) [facility]) Active Problems:   DMII (diabetes mellitus, type 2) ([place])   HTN (hypertension)   HLD (hyperlipidemia)   Dyslipidemia   Dysphagia, post-stroke   HOCM (hypertrophic obstructive cardiomyopathy) ([place])   Discharged Condition: stable  Significant Diagnostic Studies: DG Chest 2 View  Result Date: 05/20/2021 CLINICAL DATA:  Wheezing EXAM: CHEST - 2 VIEW COMPARISON:  None. FINDINGS: Cardiomegaly with vascular congestion. Possible bronchitic changes. No consolidation, pleural effusion or pneumothorax IMPRESSION: 1. Cardiomegaly with slight central congestion 2. Mild bronchitic changes Electronically Signed   By: Donavan Foil M.D.   On: 05/20/2021 19:24    DG Toe Great Right  Result Date: 05/31/2021 CLINICAL DATA:  Right great toe pain. EXAM: RIGHT GREAT TOE COMPARISON:  None. FINDINGS: No fracture or bone lesion. Mild asymmetric joint space narrowing at the first metatarsophalangeal joint. No periarticular erosion. No marginal osteophytes. There is nonspecific soft tissue swelling extending from the medial forefoot across the great toe. IMPRESSION: 1. No fracture or dislocation. 2. No periarticular erosion. Mild asymmetric joint space narrowing at the first metatarsophalangeal joint is nonspecific but suggests mild osteoarthritis. Inflammatory arthropathy is not excluded. Electronically Signed   By: Lajean Manes M.D.   On: 05/31/2021 15:20   DG Swallowing Func-Speech Pathology  Result Date: 06/02/2021 Table formatting from the original result was not included. Objective Swallowing Evaluation: Type of Study: MBS-Modified Barium Swallow Study  Patient Details Name: Brandon French MRN: [id number] Date of Birth: [birth year] Today's Date: 06/02/2021 Time: SLP Start  Time (ACUTE ONLY): 77 -SLP Stop Time (ACUTE ONLY): 1300 SLP Time Calculation (min) (ACUTE ONLY): 30 min Past Medical History: Past Medical History: Diagnosis Date  Diabetes mellitus ([place])   Elevated cholesterol   High blood pressure  Past Surgical History: No past surgical history on file. HPI: 55 year old male with history of diabetes mellitus type 2, hypertension, hyperlipidemia, unspecified stroke with possible residual left-sided weakness presented with weakness and slurred speech.  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  He failed swallow screen and NG tube was placed. MRI shows Acute nonhemorrhagic 12 mm infarct in the posterior limb of the left internal capsule or lateral thalamus.  Subjective: "[place]" Assessment / Plan / Recommendation CHL IP CLINICAL IMPRESSIONS 06/02/2021 Clinical Impression Patient presents with a mild-moderate oral and a mod-severe pharyngeal phase dysphagia. Although his oral phase has improved as compared to previous MBS on 10/19, his pharyngeal phase has not improved enough to allow for upgrade of liquid consistency beyond honey thick. During oral phase, patient exhibited prolonged mastication and piecemeal swallowing with regular solids and weak lingual manipulation and mild anterior to posterior transit delays with honey thick, nectar thick, thin liquids and puree solids. Swallow initiation was delayed to level of vallecular sinus with regular solids, puree solids and honey thick liquids. Timing of swallow initiation was inconsistent with thin liquids and nectar thick liquids (via cup or spoon) and if patient initiated swallow at level of vallecular sinus, he did not exhibit any penetration or aspiration, however when he did not initiate swallow until level of pyriform sinus, then he would exhibit silent penetration and aspiration of thin liquids and nectar thick liquids. No significant amount of pharyngeal residuals were observed  post initial swallows with any of the texted bolus consistencies. Chin  tuck strategy resulted in aspiraiton with nectar thick liquids. No penetration or aspiration was obseved with cup sips or teaspoon sips of honey thick liquids. SLP is recommending to continue with Dys 1 solids, honey thick liquids, but allow patient to have honey thick liquids via cup sips instead of teaspoon sips as had been previous recommendation. SLP Visit Diagnosis Dysphagia, oropharyngeal phase (R13.12);Dysphagia, oral phase (R13.11) Attention and concentration deficit following -- Frontal lobe and executive function deficit following -- Impact on safety and function Moderate aspiration risk;Risk for inadequate nutrition/hydration   CHL IP TREATMENT RECOMMENDATION 05/18/2021 Treatment Recommendations Therapy as outlined in treatment plan below   Prognosis 05/18/2021 Prognosis for Safe Diet Advancement Fair Barriers to Reach Goals Severity of deficits Barriers/Prognosis Comment -- CHL IP DIET RECOMMENDATION 06/02/2021 SLP Diet Recommendations Dysphagia 1 (Puree) solids;Honey thick liquids Liquid Administration via Cup;Spoon Medication Administration Crushed with puree Compensations Minimize environmental distractions;Slow rate;Small sips/bites Postural Changes Seated upright at 90 degrees   CHL IP OTHER RECOMMENDATIONS 06/02/2021 Recommended Consults -- Oral Care Recommendations Other (Comment) Other Recommendations --   CHL IP FOLLOW UP RECOMMENDATIONS 05/19/2021 Follow up Recommendations Inpatient Rehab   CHL IP FREQUENCY AND DURATION 05/18/2021 Speech Therapy Frequency (ACUTE ONLY) min 2x/week Treatment Duration 1 week      CHL IP ORAL PHASE 06/02/2021 Oral Phase Impaired Oral - Pudding Teaspoon -- Oral - Pudding Cup -- Oral - Honey Teaspoon NT Oral - Honey Cup Reduced posterior propulsion;Weak lingual manipulation Oral - Nectar Teaspoon Reduced posterior propulsion;Weak lingual manipulation Oral - Nectar Cup Weak lingual  manipulation;Reduced posterior propulsion Oral - Nectar Straw -- Oral - Thin Teaspoon Weak lingual manipulation Oral - Thin Cup -- Oral - Thin Straw -- Oral - Puree Reduced posterior propulsion;Weak lingual manipulation Oral - Mech Soft Weak lingual manipulation;Reduced posterior propulsion;Impaired mastication;Piecemeal swallowing Oral - Regular -- Oral - Multi-Consistency -- Oral - Pill NT Oral Phase - Comment --  CHL IP PHARYNGEAL PHASE 06/02/2021 Pharyngeal Phase Impaired Pharyngeal- Pudding Teaspoon -- Pharyngeal -- Pharyngeal- Pudding Cup -- Pharyngeal -- Pharyngeal- Honey Teaspoon NT Pharyngeal -- Pharyngeal- Honey Cup Delayed swallow initiation-vallecula Pharyngeal Material does not enter airway Pharyngeal- Nectar Teaspoon Delayed swallow initiation-pyriform sinuses;Delayed swallow initiation-vallecula;Penetration/Aspiration before swallow Pharyngeal Material enters airway, passes BELOW cords without attempt by patient to eject out (silent aspiration) Pharyngeal- Nectar Cup Delayed swallow initiation-pyriform sinuses;Penetration/Aspiration before swallow;Penetration/Aspiration during swallow Pharyngeal Material enters airway, passes BELOW cords without attempt by patient to eject out (silent aspiration) Pharyngeal- Nectar Straw -- Pharyngeal -- Pharyngeal- Thin Teaspoon Delayed swallow initiation-pyriform sinuses;Penetration/Aspiration during swallow;Penetration/Aspiration before swallow Pharyngeal Material enters airway, passes BELOW cords without attempt by patient to eject out (silent aspiration) Pharyngeal- Thin Cup -- Pharyngeal -- Pharyngeal- Thin Straw -- Pharyngeal -- Pharyngeal- Puree Delayed swallow initiation-vallecula Pharyngeal -- Pharyngeal- Mechanical Soft -- Pharyngeal -- Pharyngeal- Regular -- Pharyngeal -- Pharyngeal- Multi-consistency -- Pharyngeal -- Pharyngeal- Pill NT Pharyngeal -- Pharyngeal Comment --  CHL IP CERVICAL ESOPHAGEAL PHASE 06/02/2021 Cervical Esophageal Phase WFL Pudding  Teaspoon -- Pudding Cup -- Honey Teaspoon -- Honey Cup -- Nectar Teaspoon -- Nectar Cup -- Nectar Straw -- Thin Teaspoon -- Thin Cup -- Thin Straw -- Puree -- Mechanical Soft -- Regular -- Multi-consistency -- Pill -- Cervical Esophageal Comment -- Sonia Baller, MA, CCC-SLP Speech Therapy               VAS [place] LOWER EXTREMITY VENOUS (DVT)  Result Date: 05/21/2021  Lower Venous DVT Study Patient Name:  Dawsen Madaline Savage  Date of Exam:  05/21/2021 Medical Rec #: [id number]       Accession #:    [id number] Date of Birth: [birth year]      Patient Gender: M Patient Age:   81 years Exam Location:  [facility] Procedure:      VAS [place] LOWER EXTREMITY VENOUS (DVT) Referring Phys: Chadwick Reiswig --------------------------------------------------------------------------------  Indications: Stroke.  Limitations: Body habitus and poor ultrasound/tissue interface. Comparison Study: no prior Performing Technologist: Archie Patten RVS  Examination Guidelines: A complete evaluation includes B-mode imaging, spectral Doppler, color Doppler, and power Doppler as needed of all accessible portions of each vessel. Bilateral testing is considered an integral part of a complete examination. Limited examinations for reoccurring indications may be performed as noted. The reflux portion of the exam is performed with the patient in reverse Trendelenburg.  +---------+---------------+---------+-----------+----------+-------------------+ RIGHT    CompressibilityPhasicitySpontaneityPropertiesThrombus Aging      +---------+---------------+---------+-----------+----------+-------------------+ CFV      Full           Yes      Yes                                      +---------+---------------+---------+-----------+----------+-------------------+ SFJ      Full                                                             +---------+---------------+---------+-----------+----------+-------------------+ FV Prox  Full                                                              +---------+---------------+---------+-----------+----------+-------------------+ FV Mid   Full                                                             +---------+---------------+---------+-----------+----------+-------------------+ FV DistalFull                                                             +---------+---------------+---------+-----------+----------+-------------------+ PFV      Full                                                             +---------+---------------+---------+-----------+----------+-------------------+ POP      Full           Yes      Yes                                      +---------+---------------+---------+-----------+----------+-------------------+  PTV      Full                                                             +---------+---------------+---------+-----------+----------+-------------------+ PERO                                                  Not well visualized +---------+---------------+---------+-----------+----------+-------------------+   +---------+---------------+---------+-----------+----------+--------------+ LEFT     CompressibilityPhasicitySpontaneityPropertiesThrombus Aging +---------+---------------+---------+-----------+----------+--------------+ CFV      Full           Yes      Yes                                 +---------+---------------+---------+-----------+----------+--------------+ SFJ      Full                                                        +---------+---------------+---------+-----------+----------+--------------+ FV Prox  Full                                                        +---------+---------------+---------+-----------+----------+--------------+ FV Mid   Full                                                        +---------+---------------+---------+-----------+----------+--------------+  FV DistalFull                                                        +---------+---------------+---------+-----------+----------+--------------+ PFV      Full                                                        +---------+---------------+---------+-----------+----------+--------------+ POP      Full           Yes      Yes                                 +---------+---------------+---------+-----------+----------+--------------+ PTV      Full                                                        +---------+---------------+---------+-----------+----------+--------------+  PERO     Full                                                        +---------+---------------+---------+-----------+----------+--------------+     Summary: BILATERAL: - No evidence of deep vein thrombosis seen in the lower extremities, bilaterally. -No evidence of popliteal cyst, bilaterally.   *See table(s) above for measurements and observations. Electronically signed by Deitra Mayo MD on 05/21/2021 at 3:59:41 PM.    Final     Labs:  Basic Metabolic Panel: BMP Latest Ref Rng & Units 06/06/2021 06/02/2021 05/30/2021  Glucose 70 - 99 mg/dL 124(H) 142(H) 101(H)  BUN 6 - 20 mg/dL 33(H) 29(H) 26(H)  Creatinine 0.61 - 1.24 mg/dL 2.15(H) 2.20(H) 2.17(H)  Sodium 135 - 145 mmol/L 137 137 138  Potassium 3.5 - 5.1 mmol/L 4.0 4.2 3.9  Chloride 98 - 111 mmol/L 104 105 105  CO2 22 - 32 mmol/L 28 27 28   Calcium 8.9 - 10.3 mg/dL 8.6(L) 8.6(L) 8.3(L)     CBC: CBC Latest Ref Rng & Units 06/06/2021 05/30/2021 05/23/2021  WBC 4.0 - 10.5 K/uL 12.5(H) 7.3 5.9  Hemoglobin 13.0 - 17.0 g/dL 11.7(L) 10.6(L) 11.7(L)  Hematocrit 39.0 - 52.0 % 37.1(L) 35.4(L) 38.1(L)  Platelets 150 - 400 K/uL 397 337 306     CBG: Recent Labs  Lab 06/06/21 0559 06/06/21 1143 06/06/21 1633 06/06/21 2119 06/07/21 0645  GLUCAP 116* 162* 182* 198* 117*    Brief HPI:   ZARAGOZA FOUSE is a 55 y.o. male T2DM, question was  admitted he was drooling and was reported to have problems swallowing.  MRI/MRA brain done showing acute nonhemorrhagic infarct in posterior limb of left internal capsule lateral thalamus, moderate distal BAS stenosis, moderate to high-grade P1 segment stenosis bilaterally and multiple remote lacunar infarcts.    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 Merlene Laughter neurology was consulted.  For input and recommended aspirin Plavix x1 month followed by Plavix alone.  He was kept n.p.o. due to severe dysphagia and tube feeds ongoing for nutrition support.  He was able to tolerate honey liquids by teaspoon and PEG tube was recommended due to concerns of ability to maintain adequate nutrition.  Patient continued to be limited by left sided hemiparesis with balance deficits, dysphagia, aphasia and fatigue.  CIR was recommended due to functional decline.   Hospital Course: RASTUS NEIDHART was admitted to rehab 05/20/2021 for inpatient therapies to consist of PT, ST and OT at least three hours five days a week. Past admission physiatrist, therapy team and rehab RN have worked together to provide customized collaborative inpatient rehab.  He was kept n.p.o. with tube feeds ongoing for nutritional support.   He was maintained on DAPT during his stay with serial CBC showing H&H, white count and platelets to be stable.  He was advised to stop aspirin after 11/21.  His blood pressures were monitored on TID basis and amlodipine was increased to 10 mg, clonidine was titrated upwards and hydralazine was discontinued.  Beta-blocker was discontinued due to episodic bradycardia.  His diabetes has been monitored with ac/hs CBG checks and SSI was use prn for tighter BS control.  As swallow function improved tube feeds were discontinued and he was started  on dysphagia 1 with honey liquids.  IV fluids were used briefly for hydration and then  discontinued.  Follow-up labs on thickened liquids shows mild prerenal azotemia and patient/mother were advised to increase fluid intake to maintain adequate hydration.   He has tolerated increase in activity without any reports of cardiac symptoms.   He did report difficulty with voiding at admission therefore home Proscar and Flomax were resumed.  He was toileted every 3-4 hours but continued to have episodes of bladder incontinence.  Hospital course was significant for gout flare right great toe which was treated with short course steroids.  He has has leukocytosis on follow-up CBC felt to be due to gout flare and steroids on board with no signs of infection noted.  His speech and verbal output has improved.  He is able to handle oral secretions and is tolerating diet advancement without S/S of aspiration.  He has progressed to supervision level and will continue to receive HHPT, [place] and HHST by [facility] after discharge.    Rehab course: During patient's stay in rehab weekly team conferences were held to monitor patient's progress, set goals and discuss barriers to discharge. At admission, patient required mod assist with mobility and with basic ADL tasks.  He exhibited severe dysphagia with inability to handle oral secretion required n.p.o. with tube feeds for nutritional support. He  has had improvement in activity tolerance, balance, postural control as well as ability to compensate for deficits. He has had improvement in functional use LUE  and LLE as well as improvement in awareness.  He requires supervision for ADL tasks and CGA to min assist for pericare.  He requires supervision with verbal cues for sit to stand transfers and contact-guard assist to ambulate 50 feet with a rolling walker..  He has made slow but steady gains in regards to his aphasia and dysphagia during the stay.  He requires mod assist for utilization of speaking strategies at word and phrase level.  He has had  improvement in swallow function tolerating with supervision to utilize swallow strategies.  Family education was completed regarding diet as well as all aspects of safety and.   Disposition: Home  Diet: Dysphagia 1, honey liquids.   Special Instructions: Recommend referral to cardiology for work up of possible [place] Will need BMET rechecked in a week to follow up renal status.  3.  Repeat CBC in 1 week to monitor for resolution of leukocytosis.  Allergies as of 06/07/2021       Reactions   Aleve [naproxen]         Medication List     STOP taking these medications    glimepiride 2 MG tablet Commonly known as: Amaryl   metoprolol succinate 100 MG 24 hr tablet Commonly known as: TOPROL-XL       TAKE these medications    acetaminophen 325 MG tablet Commonly known as: TYLENOL Take 1-2 tablets (325-650 mg total) by mouth every 4 (four) hours as needed for mild pain. What changed:  how much to take reasons to take this   amLODipine 10 MG tablet Commonly known as: NORVASC Take 1 tablet (10 mg total) by mouth daily. What changed:  medication strength how much to take   aspirin 81 MG EC tablet Take 1 tablet (81 mg total) by mouth daily with breakfast. Please take Aspirin 81 mg daily till 11/21 then stop. What changed: additional instructions   atorvastatin 40 MG tablet Commonly known as: LIPITOR Take 1 tablet (  40 mg total) by mouth daily after supper. For Stroke Prevention What changed: when to take this   cloNIDine 0.2 MG tablet Commonly known as: CATAPRES Take 1 tablet (0.2 mg total) by mouth 3 (three) times daily. What changed:  medication strength how much to take when to take this   clopidogrel 75 MG tablet Commonly known as: PLAVIX Take 1 tablet (75 mg total) by mouth daily. Please take Aspirin 81 mg daily along with Plavix 75 mg daily for 30 days. STOP the aspirin after What changed:  how to take this additional instructions   diclofenac Sodium 1 %  Gel Commonly known as: VOLTAREN Apply 2 g topically 4 (four) times daily. To right toe/foot   finasteride 5 MG tablet Commonly known as: PROSCAR Take 1 tablet (5 mg total) by mouth daily.   food thickener Liqd Commonly known as: SIMPLYTHICK (HONEY/LEVEL 3/MODERATELY THICK) Use one packet to thicken any liquids to honey consistency.   gabapentin 100 MG capsule Commonly known as: NEURONTIN Take 1 capsule (100 mg total) by mouth at bedtime.   pantoprazole 40 MG tablet Commonly known as: PROTONIX Take 1 tablet (40 mg total) by mouth daily.   senna-docusate 8.6-50 MG tablet Commonly known as: Senokot-S Take 2 tablets by mouth 2 (two) times daily.   tamsulosin 0.4 MG Caps capsule Commonly known as: FLOMAX Take 1 capsule (0.4 mg total) by mouth daily.   traZODone 50 MG tablet Commonly known as: DESYREL Take 1 tablet (50 mg total) by mouth at bedtime.        Follow-up Information     Heather Roberts, NP. Call.   Specialty: Nurse Practitioner Why: for post hospital follow up Contact information: [address]  [address] [place] [address] [phone]         Kirsteins, Victorino Sparrow, MD Follow up.   Specialty: Physical Medicine and Rehabilitation Why: office will contact you with follow up appointment Contact information: [address] [place] [address] [phone]         Beryle Beams, MD. Call.   Specialty: Neurology Why: for stroke follow up Contact information: [address] [place] [address] [phone]                 Signed: Jacquelynn Cree 06/09/2021, 4:29 PM

## 2021-06-13 ENCOUNTER — Telehealth: Payer: Self-pay

## 2021-06-13 NOTE — Telephone Encounter (Signed)
Marcelino Duster, OT with [facility] called to get verbal orders for home health OT  2x/week for 1 week 0 1 week 1x/week for 2 weeks  Verbal orders given

## 2021-06-16 ENCOUNTER — Other Ambulatory Visit: Payer: Self-pay

## 2021-06-16 ENCOUNTER — Encounter: Payer: Medicare HMO | Attending: Registered Nurse | Admitting: Registered Nurse

## 2021-06-16 ENCOUNTER — Encounter: Payer: Self-pay | Admitting: Registered Nurse

## 2021-06-16 VITALS — BP 157/87 | HR 77 | Temp 98.2°F | Ht 72.0 in | Wt 319.2 lb

## 2021-06-16 DIAGNOSIS — E785 Hyperlipidemia, unspecified: Secondary | ICD-10-CM | POA: Insufficient documentation

## 2021-06-16 DIAGNOSIS — I63011 Cerebral infarction due to thrombosis of right vertebral artery: Secondary | ICD-10-CM | POA: Diagnosis not present

## 2021-06-16 DIAGNOSIS — I10 Essential (primary) hypertension: Secondary | ICD-10-CM | POA: Insufficient documentation

## 2021-06-16 DIAGNOSIS — E119 Type 2 diabetes mellitus without complications: Secondary | ICD-10-CM | POA: Diagnosis not present

## 2021-06-16 NOTE — Patient Instructions (Signed)
Dr. Gerilyn Pilgrim, [facility] ( Neurologist)  [address] [place], [place] [address]  Appointment: December the 7th, 2022 Arrival Time: 2:45 Appointment time: [address] Insurance card and ID   When you see the Primary Care Physician on 07/19/2021,  Have  the Doctor to send his notes to Dr Gerilyn Pilgrim

## 2021-06-16 NOTE — Progress Notes (Signed)
Subjective:    Patient ID: Brandon French, male    DOB: 1966/04/14, 55 y.o.   MRN: [id number]  HPI: Brandon French is a 55 y.o. male who is here for HFU appointment for follow up for his Cerebral Vascular Accident, Essential Hypertension and Dyslipidemia.  He went to [facility] on 05/16/2021 with complaint of weakness and slurred speech.  Dr. Rhunette Croft HPI: HPI: Brandon French  is a 55 y.o. male, with history of DMII, HTN, HLD and more presents to the ED with a chief of weakness and slurred speech.  Patient reports that the onset of symptoms was around 2:30 PM.  He later then said he actually went to sleep at 2:30 PM and woke up at 5:30 PM with slurred speech.  He reports he is never had this problem before.  He has had left upper and lower extremity weakness as well.  He has been drooling as well.  He has had trouble swallowing.  Patient has had a history of stroke and started blood pressure medication after the last stroke.  He is difficult to understand due to slurred and garbled speech, but it sounds like the left-sided weakness was present after the last stroke.  He does think it is worse now though.  Patient reports that he was otherwise in his normal state of health up until this started this afternoon.  No other complaints at this time.   Patient does not smoke, does not drink, does not use illicit drugs. Neurology Consulted: Dr. Gerilyn Pilgrim he will be on Asprin and Plavix x 1 month followed by Plavix alone. CT Head WO Contrast:  IMPRESSION: 1. No acute infarction identified. 2. 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 Old small vessel infarctions of the thalami, basal ganglia and hemispheric white matter. 3. ASPECTS is 10. 4. These results were called by telephone at the time of interpretation on 05/16/2021 at 6:53 pm to provider [facility] , who verbally acknowledged these results.   MRA: MRI IMPRESSION: 1. Acute nonhemorrhagic 12 mm infarct in the posterior limb of the left internal capsule or  lateral thalamus. 2. Remote nonhemorrhagic lacunar infarcts of the thalami bilaterally with additional remote lacunar infarcts of the corona radiata bilaterally. This is likely the sequelae of the high-grade proximal P1 segment stenoses with impact on the meticulous straight arteries. 3. Multiple remote lacunar infarcts of the right paramedian brainstem and cerebellum bilaterally. 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 4. Asymmetric attenuation to left MCA branch vessels suggesting either proximal MCA narrowing or overall decreased perfusion. 5. Moderate to high-grade proximal P1 segment stenoses bilaterally. 6. Moderate distal basilar artery stenosis. 7. Less than 50% stenosis of the mid left common carotid artery.  [name] was admitted to inpatient rehabilitation on 05/20/2021 and discharged home on 06/07/2021. He is receiving home health therapy from [place]. He denies any pain. He rates his pain 0. Also reports he has a good appetite.   Dr. Gerilyn Pilgrim office was called by this provider, a scheduled HFU appointment was made  with Neurology, he verbalizes understanding.    Pain Inventory Average Pain 0 Pain Right Now 0 My pain is  no pain  In the last 24 hours, has pain interfered with the following? General activity 0 Relation with others 0 Enjoyment of life 0 What TIME of day is your pain at its worst? varies Sleep (in general) Good  Pain is worse with:  no pain Pain improves with:  no pain Relief from Meds:  no pain  walk with assistance use a walker how many minutes can you walk? 15-20 mins ability to climb steps?  yes do you drive?  no Do you have any goals in this area?  yes  disabled: date disabled 2017 I need assistance with the following:  shopping  tremor trouble walking  Any changes since last visit?  no  Any changes since last visit?  no    Family History  Problem Relation Age of Onset   Diabetes Mother    Cancer Mother     Diabetes Father    Social History   Socioeconomic History   Marital status: Divorced    Spouse name: Not on file   Number of children: Not on file   Years of education: Not on file   Highest education level: Not on file  Occupational History   Not on file  Tobacco Use   Smoking status: Former    Types: Cigarettes    Quit date: 2011    Years since quitting: 11.8   Smokeless tobacco: Never  Vaping Use   Vaping Use: Never used  Substance and Sexual Activity   Alcohol use: Never   Drug use: Never   Sexual activity: Not on file  Other Topics Concern   Not on file  Social History Narrative   Not on file   Social Determinants of Health   Financial Resource Strain: Not on file  Food Insecurity: Not on file  Transportation Needs: Not on file  Physical Activity: Not on file  Stress: Not on file  Social Connections: Not on file   History reviewed. No pertinent surgical history. Past Medical History:  Diagnosis Date   Diabetes mellitus ([place])    Elevated cholesterol    High blood pressure    Ht 6' (1.829 m)   Wt (!) 319 lb 3.2 oz (144.8 kg)   BMI 43.29 kg/m   Opioid Risk Score:   Fall Risk Score:  `1  Depression screen PHQ 2/9  Depression screen PHQ 2/9 06/16/2021  Decreased Interest 0  Down, Depressed, Hopeless 0  PHQ - 2 Score 0  Altered sleeping 0  Tired, decreased energy 0  Change in appetite 0  Feeling bad or failure about yourself  0  Trouble concentrating 0  Moving slowly or fidgety/restless 0  Suicidal thoughts 0  PHQ-9 Score 0  Difficult doing work/chores Not difficult at all     Review of Systems  Constitutional: Negative.   HENT: Negative.    Eyes: Negative.   Respiratory: Negative.    Cardiovascular: Negative.   Gastrointestinal: Negative.   Endocrine: Negative.   Genitourinary: Negative.   Musculoskeletal:  Positive for gait problem.  Skin: Negative.   Allergic/Immunologic: Negative.   Neurological:  Positive for tremors.   Hematological: Negative.   Psychiatric/Behavioral: Negative.        Objective:   Physical Exam Vitals and nursing note reviewed.  Constitutional:      Appearance: Normal appearance.  Cardiovascular:     Rate and Rhythm: Normal rate and regular rhythm.     Pulses: Normal pulses.     Heart sounds: Normal heart sounds.  Pulmonary:     Effort: Pulmonary effort is normal.     Breath sounds: Normal breath sounds.  Musculoskeletal:     Cervical back: Normal range of motion and neck supple.     Comments: Normal Muscle Bulk and Muscle Testing Reveals:  Upper Extremities: Full ROM and Muscle Strength 5/5 Lower Extremities: Full ROM and Muscle Strength  5/5 Arises from Table Slowly using walker for support Narrow Based Gait     Skin:    General: Skin is warm and dry.  Neurological:     Mental Status: He is alert and oriented to person, place, and time.  Psychiatric:        Mood and Affect: Mood normal.        Behavior: Behavior normal.         Assessment & Plan:  Cerebral Vascular Accident: [place] with [facility]. He has a scheduled appointment with Dr Merlene Laughter  on 07/06/2021. Continue to monitor.  2,  Essential Hypertension: He has a scheduled appointment with PCP. Continue current medication as prescribed. Continue to monitor.  3.  Dyslipidemia: Continue current medication regimen. He has a scheduled appointment with PCP on 07/19/2021  F/U wih Dr Letta Pate in 4- 6 weeks.

## 2021-06-20 ENCOUNTER — Telehealth: Payer: Self-pay

## 2021-06-20 NOTE — Telephone Encounter (Signed)
The speech therapist Dierdre called to say the patient's blood pressure has been elevated multiple times. She says that the blood pressures have ranged from 154/80-170/90. She states that it's always over 150s over 80s or 90s. He is scheduled to see his PCP for the first time on 07/19/21. They want to know if medications need to be adjusted or changed. Please advise.  Per Jacalyn Lefevre, NP, called patient to inform him to check his blood pressure in the morning pre medication and then in the afternoon and evening and to keep a log of his blood pressures and send them to [place].

## 2021-06-21 NOTE — Telephone Encounter (Signed)
Left voicemail to return call to clinic to inform him of Clonidine increase and to ask if he has picked up all medications and taking as directed

## 2021-06-22 ENCOUNTER — Other Ambulatory Visit: Payer: Self-pay

## 2021-06-22 ENCOUNTER — Telehealth: Payer: Self-pay

## 2021-06-22 DIAGNOSIS — R1314 Dysphagia, pharyngoesophageal phase: Secondary | ICD-10-CM

## 2021-06-22 MED ORDER — CLONIDINE HCL 0.2 MG PO TABS: 0.2000 mg | ORAL_TABLET | Freq: Three times a day (TID) | ORAL | 1 refills | Status: DC

## 2021-06-22 NOTE — Addendum Note (Signed)
Addended by: Erick Colace on: 06/22/2021 04:15 PM   Modules accepted: Orders

## 2021-06-22 NOTE — Telephone Encounter (Signed)
Dierdre, ST called stating the patient is still having trouble swallowing food. He is on purees and still has difficulty. She would like a Modified Barium Swallow Study done at [facility]. Can you please order?

## 2021-06-22 NOTE — Telephone Encounter (Signed)
Spoke with patient and informed him that Dr. Wynn Banker wants to increase the Clonidine to 3 times a day. I also asked if all medications were picked up and taken as directed and he answered yes. Clonidine sent to [facility] pharmacy.

## 2021-06-22 NOTE — Telephone Encounter (Signed)
Per Dr. Wynn Banker, new Clonidine prescription sent [facility] pharmacy

## 2021-06-27 ENCOUNTER — Telehealth (HOSPITAL_COMMUNITY): Payer: Self-pay

## 2021-06-27 NOTE — Telephone Encounter (Signed)
Attempted to contact patient to schedule OP MBS - left voicemail. ?

## 2021-06-30 DIAGNOSIS — M79652 Pain in left thigh: Secondary | ICD-10-CM | POA: Diagnosis not present

## 2021-06-30 DIAGNOSIS — G4700 Insomnia, unspecified: Secondary | ICD-10-CM

## 2021-06-30 DIAGNOSIS — N1832 Chronic kidney disease, stage 3b: Secondary | ICD-10-CM | POA: Diagnosis not present

## 2021-06-30 DIAGNOSIS — E1165 Type 2 diabetes mellitus with hyperglycemia: Secondary | ICD-10-CM | POA: Diagnosis not present

## 2021-06-30 DIAGNOSIS — I69354 Hemiplegia and hemiparesis following cerebral infarction affecting left non-dominant side: Secondary | ICD-10-CM | POA: Diagnosis not present

## 2021-06-30 DIAGNOSIS — I69391 Dysphagia following cerebral infarction: Secondary | ICD-10-CM | POA: Diagnosis not present

## 2021-06-30 DIAGNOSIS — Z7982 Long term (current) use of aspirin: Secondary | ICD-10-CM

## 2021-06-30 DIAGNOSIS — I69322 Dysarthria following cerebral infarction: Secondary | ICD-10-CM | POA: Diagnosis not present

## 2021-06-30 DIAGNOSIS — Z7902 Long term (current) use of antithrombotics/antiplatelets: Secondary | ICD-10-CM

## 2021-06-30 DIAGNOSIS — Z87891 Personal history of nicotine dependence: Secondary | ICD-10-CM

## 2021-06-30 DIAGNOSIS — Z6841 Body Mass Index (BMI) 40.0 and over, adult: Secondary | ICD-10-CM

## 2021-06-30 DIAGNOSIS — E7800 Pure hypercholesterolemia, unspecified: Secondary | ICD-10-CM

## 2021-06-30 DIAGNOSIS — E1122 Type 2 diabetes mellitus with diabetic chronic kidney disease: Secondary | ICD-10-CM | POA: Diagnosis not present

## 2021-06-30 DIAGNOSIS — I129 Hypertensive chronic kidney disease with stage 1 through stage 4 chronic kidney disease, or unspecified chronic kidney disease: Secondary | ICD-10-CM | POA: Diagnosis not present

## 2021-06-30 DIAGNOSIS — R1312 Dysphagia, oropharyngeal phase: Secondary | ICD-10-CM | POA: Diagnosis not present

## 2021-07-06 DIAGNOSIS — I10 Essential (primary) hypertension: Secondary | ICD-10-CM | POA: Diagnosis not present

## 2021-07-06 DIAGNOSIS — G4733 Obstructive sleep apnea (adult) (pediatric): Secondary | ICD-10-CM | POA: Diagnosis not present

## 2021-07-06 DIAGNOSIS — I69322 Dysarthria following cerebral infarction: Secondary | ICD-10-CM | POA: Diagnosis not present

## 2021-07-06 DIAGNOSIS — G8190 Hemiplegia, unspecified affecting unspecified side: Secondary | ICD-10-CM | POA: Diagnosis not present

## 2021-07-06 DIAGNOSIS — I6381 Other cerebral infarction due to occlusion or stenosis of small artery: Secondary | ICD-10-CM | POA: Diagnosis not present

## 2021-07-06 DIAGNOSIS — R269 Unspecified abnormalities of gait and mobility: Secondary | ICD-10-CM | POA: Diagnosis not present

## 2021-07-06 DIAGNOSIS — G4701 Insomnia due to medical condition: Secondary | ICD-10-CM | POA: Diagnosis not present

## 2021-07-14 ENCOUNTER — Other Ambulatory Visit: Payer: Self-pay

## 2021-07-14 ENCOUNTER — Ambulatory Visit (HOSPITAL_COMMUNITY)
Admission: RE | Admit: 2021-07-14 | Discharge: 2021-07-14 | Disposition: A | Discharge status: Home / Self Care | Payer: Medicare HMO | Source: Ambulatory Visit | Attending: Physical Medicine & Rehabilitation | Admitting: Physical Medicine & Rehabilitation

## 2021-07-14 DIAGNOSIS — E119 Type 2 diabetes mellitus without complications: Secondary | ICD-10-CM | POA: Insufficient documentation

## 2021-07-14 DIAGNOSIS — I10 Essential (primary) hypertension: Secondary | ICD-10-CM | POA: Insufficient documentation

## 2021-07-14 DIAGNOSIS — R1314 Dysphagia, pharyngoesophageal phase: Secondary | ICD-10-CM

## 2021-07-14 DIAGNOSIS — I69391 Dysphagia following cerebral infarction: Secondary | ICD-10-CM | POA: Insufficient documentation

## 2021-07-14 IMAGING — RF DG SWALLOWING FUNCTION
12 of 24 series · 12 of 24 positions shown · non-contrast
Comparison: Modified barium swallow

CLINICAL DATA: Dysphagia. Cough/GE reflux disease/other secondary
diagnosis

EXAM:
MODIFIED BARIUM SWALLOW
TECHNIQUE: Different consistencies of barium were administered orally to the
patient by the Speech Pathologist. Imaging of the pharynx was
performed in the lateral projection. The radiologist was present in
the fluoroscopy room for this study, providing personal supervision.
FLUOROSCOPY TIME:  Fluoroscopy Time:  3 minutes 49 seconds
Radiation Exposure Index (if provided by the fluoroscopic device):
41.62 mGy
Number of Acquired Spot Images: Multiple cine clips.

[Series 2: run · 1 of 42 frames shown (1 of 12)]
[frame 7/42]
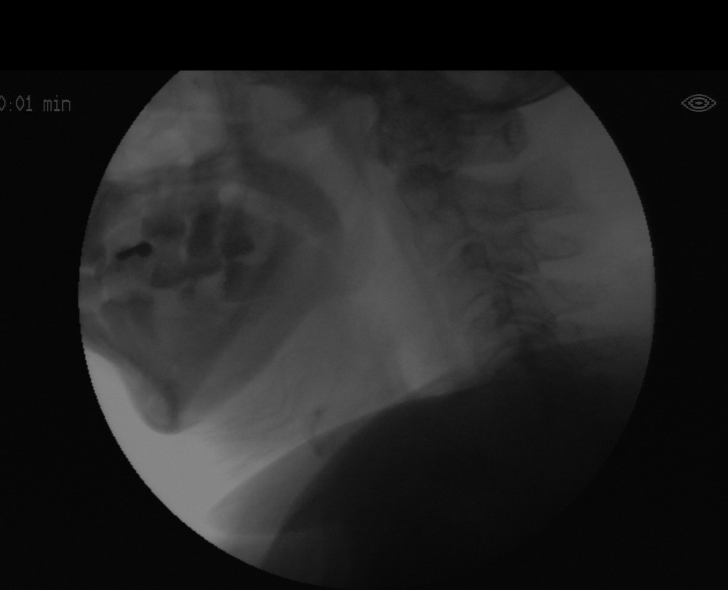

[Series 4: run · 1 of 43 frames shown (2 of 12)]
[frame 2/43]
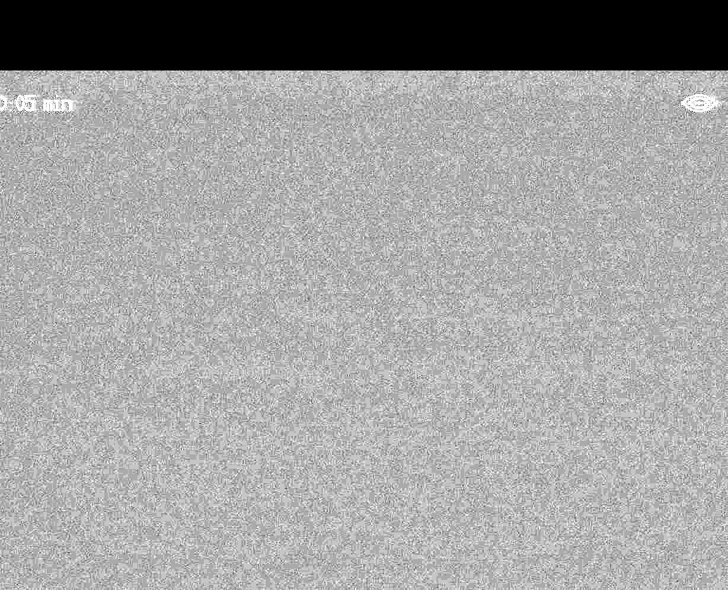

[Series 6: run · 1 of 95 frames shown (3 of 12)]
[frame 48/95]
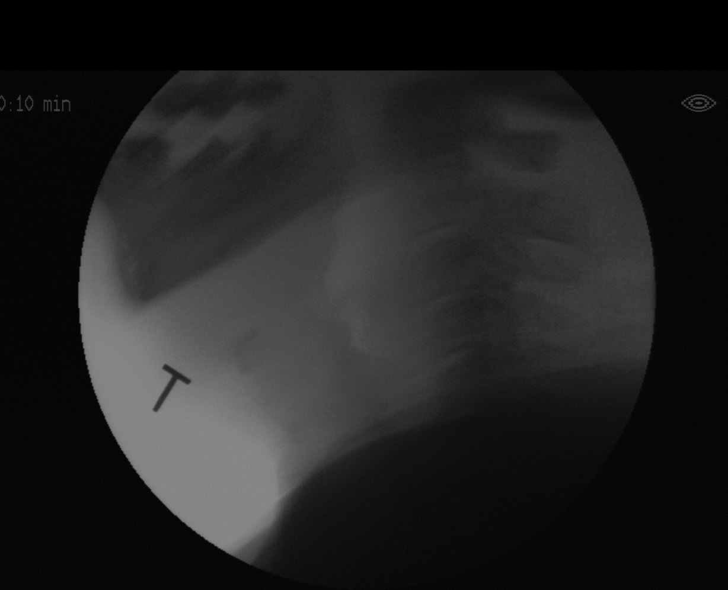

[Series 8: run · 1 of 172 frames shown (4 of 12)]
[frame 87/172]
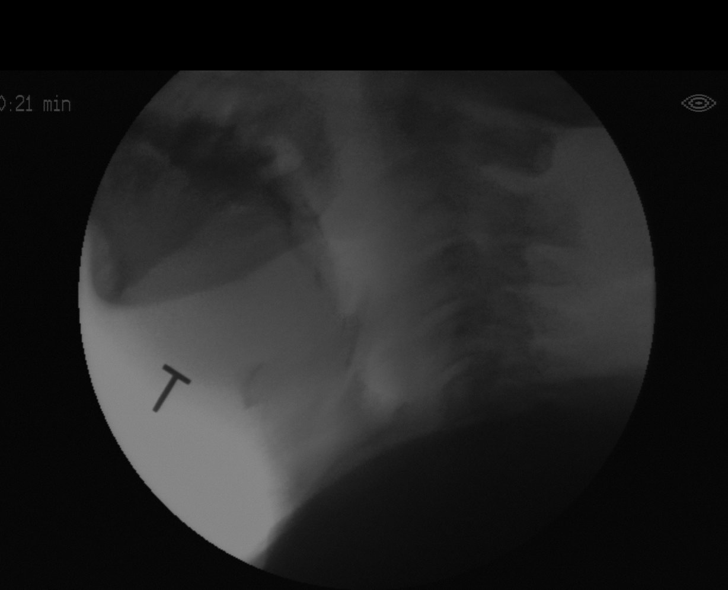

[Series 10: run · 1 of 118 frames shown (5 of 12)]
[frame 60/118]
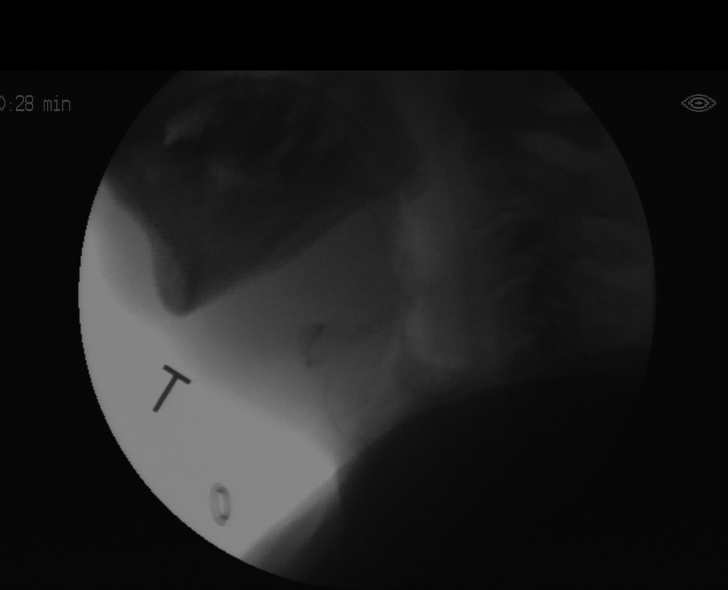

[Series 12: run · 1 of 189 frames shown (6 of 12)]
[frame 47/189]
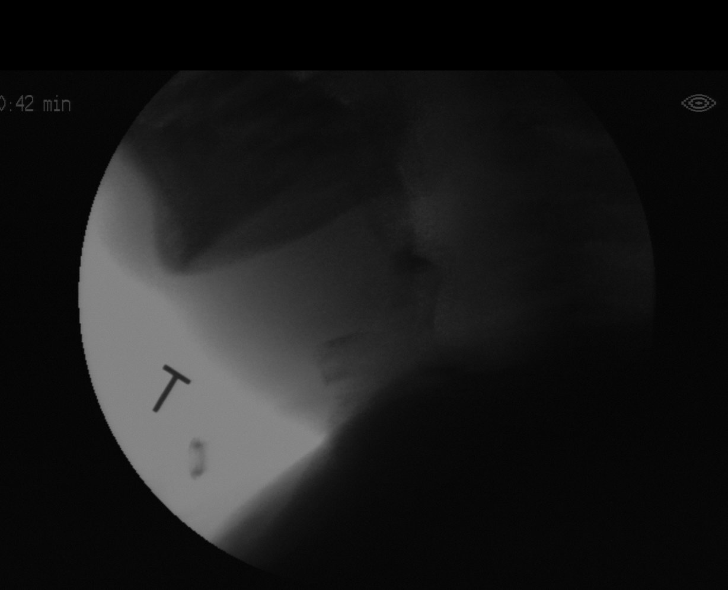

[Series 14: run · 1 of 181 frames shown (7 of 12)]
[frame 91/181]
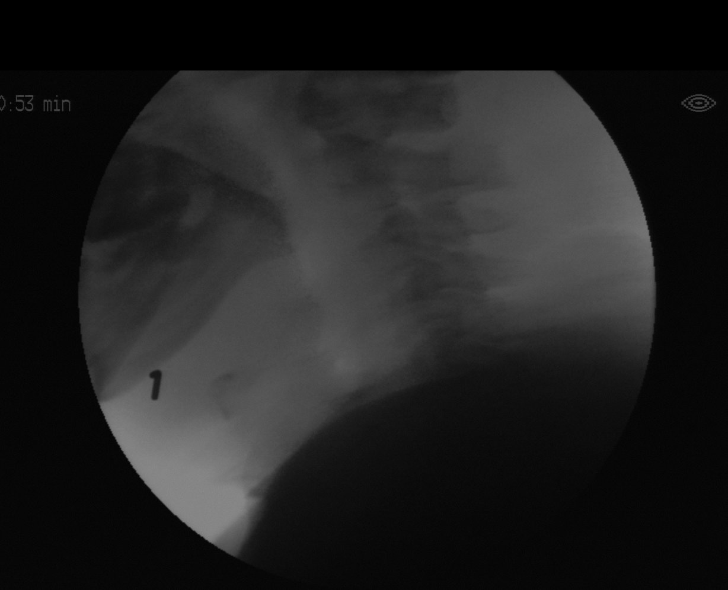

[Series 16: run · 1 of 180 frames shown (8 of 12)]
[frame 91/180]
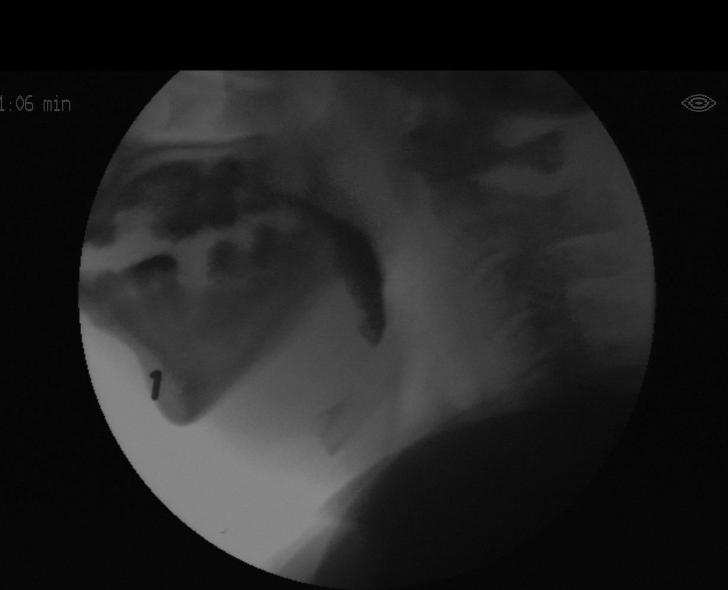

[Series 18: run · 1 of 124 frames shown (9 of 12)]
[frame 106/124]
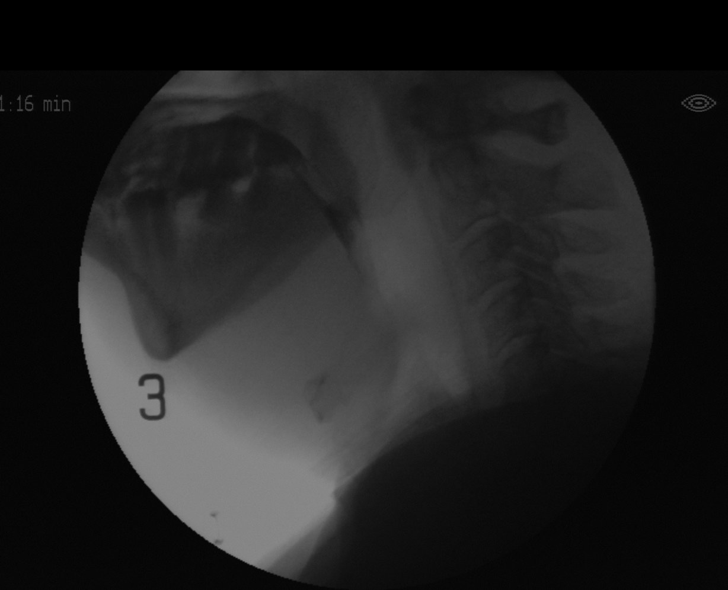

[Series 20: run · 1 of 399 frames shown (10 of 12)]
[frame 335/399]
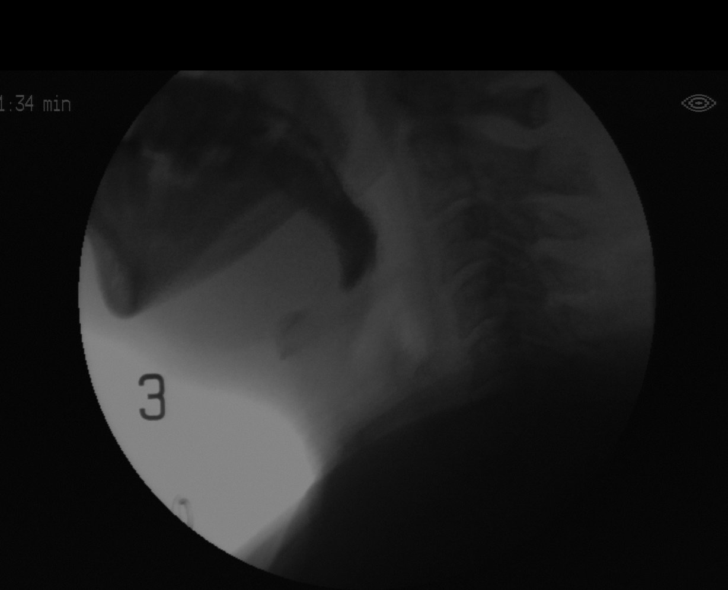

[Series 22: run · 1 of 143 frames shown (11 of 12)]
[frame 122/143]
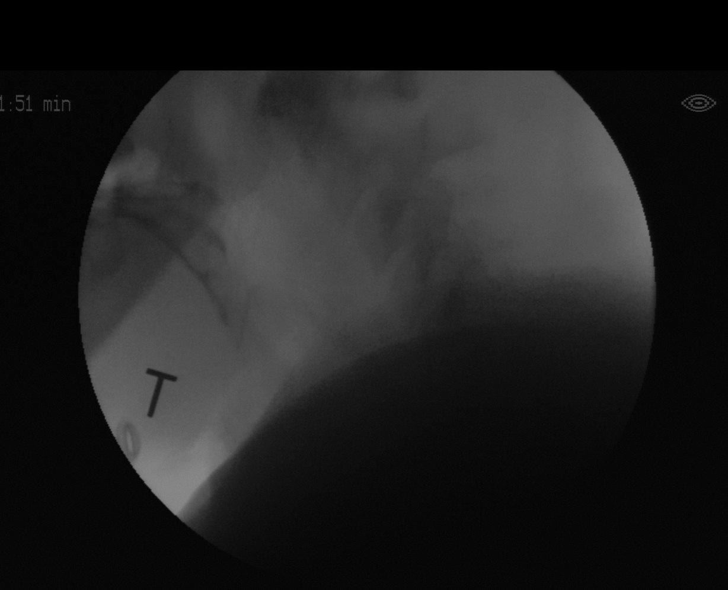

[Series 24: run · 1 of 229 frames shown (12 of 12)]
[frame 195/229]
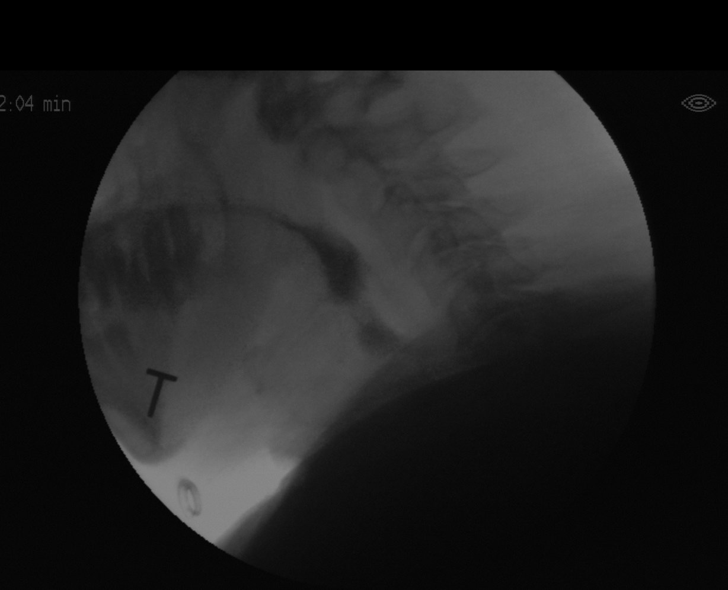

[12 of 24 positions shown; findings below may reference images not displayed]

FINDINGS: There is intratracheal aspiration with thin and nectar thick
liquids. No aspiration is seen with honey thick or puree
consistency.
IMPRESSION: Intratracheal aspiration with thin and nectar thick liquids.

Please refer to the Speech Pathologists report for complete details
and recommendations.

## 2021-07-14 NOTE — Progress Notes (Signed)
Modified Barium Swallow Progress Note  Patient Details  Name: Brandon French MRN: [id number] Date of Birth: 16-Mar-1966  Today's Date: 07/14/2021  Modified Barium Swallow completed.  Full report located under Chart Review in the Imaging Section.  Brief recommendations include the following:  Clinical Impression  Pt presents wtih mild improvements from most recent MBS, although still with oral deficits and inconsistent coordination that result in aspiration before the swallow. His lingual propulsion is weak, as is his mastication, but when given time he can adequately prepare boluses and clear them from his mouth, at times needing a second swallow to clear oral residue. Honey thick liquids, purees, and solids are consistently maintained in the valleculae before the swallow and as a result, his airway is well protected. Thin and nectar thick liquids inconsistently spill beyond the valleculae, reaching the larynx and/or pyriform sinuses with aspiration as a result. With larger volumes this does elicit a cough, which may be evidence of improving sensation, but smaller amounts do not always result in coughing despite the pt reporting that he senses it. Several compensatory strategies were attempted, including: varying bolus size/delivery method, chin tuck, oral hold. Of these strategies, none of them were effective at eliminating aspiration with thin liquids. Pt had the most success taking a small sip with an oral hold with nectar thick liquids. Recommend continuing soft solid diet - could consider advancing from honey thick liquids to nectar thick liquids with strategies if primary SLP thinks that strategies could be consistently implemented.   Swallow Evaluation Recommendations       SLP Diet Recommendations: Dysphagia 3 (Mech soft) solids;Nectar thick liquid (continue honey thick liquids if there is concern for consistent use of strategies)   Liquid Administration via: Cup;No straw   Medication  Administration: Crushed with puree   Supervision: Patient able to self feed   Compensations: Slow rate;Small sips/bites;Other (Comment) (hold sip in mouth briefly before swallowing)   Postural Changes: Seated upright at 90 degrees            Mahala Menghini., M.A. CCC-SLP Acute Rehabilitation Services Pager [phone] Office [phone]  07/14/2021,2:22 PM

## 2021-07-19 ENCOUNTER — Ambulatory Visit: Payer: Medicare HMO | Admitting: Nurse Practitioner

## 2021-08-01 DIAGNOSIS — E1165 Type 2 diabetes mellitus with hyperglycemia: Secondary | ICD-10-CM | POA: Diagnosis not present

## 2021-08-01 DIAGNOSIS — I69322 Dysarthria following cerebral infarction: Secondary | ICD-10-CM | POA: Diagnosis not present

## 2021-08-01 DIAGNOSIS — I6935 Hemiplegia and hemiparesis following cerebral infarction: Secondary | ICD-10-CM | POA: Diagnosis not present

## 2021-08-01 DIAGNOSIS — M79652 Pain in left thigh: Secondary | ICD-10-CM | POA: Diagnosis not present

## 2021-08-01 DIAGNOSIS — R1312 Dysphagia, oropharyngeal phase: Secondary | ICD-10-CM | POA: Diagnosis not present

## 2021-08-01 DIAGNOSIS — E7800 Pure hypercholesterolemia, unspecified: Secondary | ICD-10-CM | POA: Diagnosis not present

## 2021-08-01 DIAGNOSIS — E1122 Type 2 diabetes mellitus with diabetic chronic kidney disease: Secondary | ICD-10-CM | POA: Diagnosis not present

## 2021-08-01 DIAGNOSIS — I129 Hypertensive chronic kidney disease with stage 1 through stage 4 chronic kidney disease, or unspecified chronic kidney disease: Secondary | ICD-10-CM | POA: Diagnosis not present

## 2021-08-01 DIAGNOSIS — N1832 Chronic kidney disease, stage 3b: Secondary | ICD-10-CM | POA: Diagnosis not present

## 2021-08-04 ENCOUNTER — Telehealth: Payer: Self-pay

## 2021-08-04 NOTE — Telephone Encounter (Signed)
Dierdre from [facility] called to ask if we have discontinued the Atorvastatin because it is not in the patient's home. Left voicemail to ask her to return call to clinic to inform her that he was advised to see PCP and was scheduled with one per his last visit on 06/16/21. We do not manage his maintenance medications.

## 2021-08-07 ENCOUNTER — Other Ambulatory Visit: Payer: Self-pay | Admitting: Physical Medicine and Rehabilitation

## 2021-08-11 ENCOUNTER — Ambulatory Visit: Payer: Medicare HMO | Admitting: Physical Medicine & Rehabilitation

## 2021-08-12 ENCOUNTER — Other Ambulatory Visit (HOSPITAL_BASED_OUTPATIENT_CLINIC_OR_DEPARTMENT_OTHER): Payer: Self-pay

## 2021-08-12 DIAGNOSIS — G4733 Obstructive sleep apnea (adult) (pediatric): Secondary | ICD-10-CM

## 2021-08-18 ENCOUNTER — Other Ambulatory Visit: Payer: Self-pay

## 2021-08-18 ENCOUNTER — Encounter: Payer: Self-pay | Admitting: Physical Medicine & Rehabilitation

## 2021-08-18 ENCOUNTER — Encounter: Payer: Medicare HMO | Attending: Registered Nurse | Admitting: Physical Medicine & Rehabilitation

## 2021-08-18 VITALS — BP 190/117 | HR 110 | Temp 98.2°F | Ht 72.0 in | Wt 304.0 lb

## 2021-08-18 DIAGNOSIS — R1314 Dysphagia, pharyngoesophageal phase: Secondary | ICD-10-CM | POA: Insufficient documentation

## 2021-08-18 DIAGNOSIS — I69398 Other sequelae of cerebral infarction: Secondary | ICD-10-CM | POA: Diagnosis not present

## 2021-08-18 DIAGNOSIS — I69322 Dysarthria following cerebral infarction: Secondary | ICD-10-CM | POA: Diagnosis not present

## 2021-08-18 DIAGNOSIS — R269 Unspecified abnormalities of gait and mobility: Secondary | ICD-10-CM | POA: Diagnosis not present

## 2021-08-18 MED ORDER — AMLODIPINE BESYLATE 10 MG PO TABS: 10.0000 mg | ORAL_TABLET | Freq: Every day | ORAL | 1 refills | Status: DC

## 2021-08-18 MED ORDER — CLOPIDOGREL BISULFATE 75 MG PO TABS: 75.0000 mg | ORAL_TABLET | Freq: Every day | ORAL | 1 refills | Status: DC

## 2021-08-18 NOTE — Progress Notes (Signed)
Subjective:    Patient ID: Brandon French, male    DOB: 1965-10-07, 56 y.o.   MRN: [id number] 56 y.o. male T2DM, question was admitted he was drooling and was reported to have problems swallowing.  MRI/MRA brain done showing acute nonhemorrhagic infarct in posterior limb of left internal capsule lateral thalamus, moderate distal BAS stenosis, moderate to high-grade P1 segment stenosis bilaterally and multiple remote lacunar infarcts.    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 Merlene Laughter neurology was consulted.  For input and recommended aspirin Plavix x1 month followed by Plavix alone.  He was kept n.p.o. due to severe dysphagia and tube feeds ongoing for nutrition support.  He was able to tolerate honey liquids by teaspoon and PEG tube was recommended due to concerns of ability to maintain adequate nutrition.  Patient continued to be limited by left sided hemiparesis with balance deficits, dysphagia, aphasia and fatigue HPI Bladder doing better Still having problem with slurred speech but is understandable  Has appt with new PCP  CBGs reported as ok per pt  Has seen Neuro Dr Landis Gandy SLP through [facility], works on dysarthria, had MBS now on Nectar liquids rather than honey thick  Pain Inventory Average Pain 0 Pain Right Now 0 My pain is  no pain  LOCATION OF PAIN  no pain  BOWEL Number of stools per week: 2-3 Oral laxative use No  Type of laxative n/a Enema or suppository use No  History of colostomy No  Incontinent No   BLADDER Normal   Bladder incontinence no Frequent urination No  Leakage with coughing No  Difficulty starting stream No  Incomplete bladder emptying No    Mobility use a cane ability to climb steps?  yes do you drive?  no  Function Not employed  Neuro/Psych weakness  Prior Studies N/a  Physicians involved in your care N/a   Family History   Problem Relation Age of Onset   Diabetes Mother    Cancer Mother    Diabetes Father    Social History   Socioeconomic History   Marital status: Divorced    Spouse name: Not on file   Number of children: Not on file   Years of education: Not on file   Highest education level: Not on file  Occupational History   Not on file  Tobacco Use   Smoking status: Former    Types: Cigarettes    Quit date: 2011    Years since quitting: 12.0   Smokeless tobacco: Never  Vaping Use   Vaping Use: Never used  Substance and Sexual Activity   Alcohol use: Never   Drug use: Never   Sexual activity: Not on file  Other Topics Concern   Not on file  Social History Narrative   Not on file   Social Determinants of Health   Financial Resource Strain: Not on file  Food Insecurity: Not on file  Transportation Needs: Not on file  Physical Activity: Not on file  Stress: Not on file  Social Connections: Not on file   No past surgical history on file. Past Medical History:  Diagnosis Date   Diabetes mellitus (HCC)    Elevated cholesterol    High blood pressure    BP (!) 229/95    Pulse (!) 110    Temp 98.2 F (36.8 C)    Ht 6' (1.829 m)    Annell Greening Marland Kitchen)  304 lb (137.9 kg)    SpO2 96%    BMI 41.23 kg/m   Opioid Risk Score:   Fall Risk Score:  `1  Depression screen PHQ 2/9  Depression screen PHQ 2/9 06/16/2021  Decreased Interest 0  Down, Depressed, Hopeless 0  PHQ - 2 Score 0  Altered sleeping 0  Tired, decreased energy 0  Change in appetite 0  Feeling bad or failure about yourself  0  Trouble concentrating 0  Moving slowly or fidgety/restless 0  Suicidal thoughts 0  PHQ-9 Score 0  Difficult doing work/chores Not difficult at all       Review of Systems  Constitutional: Negative.   HENT: Negative.    Eyes: Negative.   Respiratory: Negative.    Cardiovascular: Negative.   Gastrointestinal: Negative.   Endocrine: Negative.   Genitourinary: Negative.   Musculoskeletal:   Positive for gait problem.  Skin: Negative.   Allergic/Immunologic: Negative.   Neurological:  Positive for weakness.  Hematological: Negative.   Psychiatric/Behavioral: Negative.        Objective:   Physical Exam Vitals and nursing note reviewed.  Constitutional:      Appearance: He is obese.  Skin:    General: Skin is warm and dry.  Neurological:     Mental Status: He is alert and oriented to person, place, and time.  Psychiatric:        Mood and Affect: Mood normal.        Behavior: Behavior normal.    Motor strength is 5/5 bilateral deltoid, bicep, tricep, grip, hip flexor, knee extensor ankle dorsiflexor and plantar flexor Coordination is normal in the upper extremities Gait wide-based no evidence of toe drag or knee instability does use a cane. Speech with moderate dysarthria     Assessment & Plan:  1.  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  He also has significant dysarthria as well as dysphagia. Will make referral to outpatient speech as well as PT He may need another modified barium swallow in the next couple weeks. I will see him back in 47months  Patient to follow-up with Dr. Merlene Laughter from neurology Patient to follow-up with his new PCP 2.  Hypertension uncontrolled, upon further investigation patient states he ran out of his amlodipine I have refilled this until he could at least get in with his new PCP.  He will continue on his clonidine.  I asked him to take his blood pressure after he gets home and has taken his new prescription for amlodipine

## 2021-08-18 NOTE — Patient Instructions (Signed)
Stroke Prevention Some medical conditions and lifestyle choices can lead to a higher risk for a stroke. You can help to prevent a stroke by eating healthy foods and exercising. It also helps to not smoke and to manage any health problems youmay have. How can this condition affect me? A stroke is an emergency. It should be treated right away. A stroke can lead to brain damage or threaten your life. There is a better chance of surviving andgetting better after a stroke if you get medical help right away. What can increase my risk? The following medical conditions may increase your risk of a stroke: Diseases of the heart and blood vessels (cardiovascular disease). High blood pressure (hypertension). Diabetes. High cholesterol. Sickle cell disease. Problems with blood clotting. Being very overweight. Sleeping problems (obstructivesleep apnea). Other risk factors include: Being older than age 60. A history of blood clots, stroke, or mini-stroke (TIA). Race, ethnic background, or a family history of stroke. Smoking or using tobacco products. Taking birth control pills, especially if you smoke. Heavy alcohol and drug use. Not being active. What actions can I take to prevent this? Manage your health conditions High cholesterol. Eat a healthy diet. If this is not enough to manage your cholesterol, you may need to take medicines. Take medicines as told by your doctor. High blood pressure. Try to keep your blood pressure below 130/80. If your blood pressure cannot be managed through a healthy diet and regular exercise, you may need to take medicines. Take medicines as told by your doctor. Ask your doctor if you should check your blood pressure at home. Have your blood pressure checked every year. Diabetes. Eat a healthy diet and get regular exercise. If your blood sugar (glucose) cannot be managed through diet and exercise, you may need to take medicines. Take medicines as told by your  doctor. Talk to your doctor about getting checked for sleeping problems. Signs of a problem can include: Snoring a lot. Feeling very tired. Make sure that you manage any other conditions you have. Nutrition  Follow instructions from your doctor about what to eat or drink. You may be told to: Eat and drink fewer calories each day. Limit how much salt (sodium) you use to 1,500 milligrams (mg) each day. Use only healthy fats for cooking, such as olive oil, canola oil, and sunflower oil. Eat healthy foods. To do this: Choose foods that are high in fiber. These include whole grains, and fresh fruits and vegetables. Eat at least 5 servings of fruits and vegetables a day. Try to fill one-half of your plate with fruits and vegetables at each meal. Choose low-fat (lean) proteins. These include low-fat cuts of meat, chicken without skin, fish, tofu, beans, and nuts. Eat low-fat dairy products. Avoid foods that: Are high in salt. Have saturated fat. Have trans fat. Have cholesterol. Are processed or pre-made. Count how many carbohydrates you eat and drink each day.  Lifestyle If you drink alcohol: Limit how much you have to: 0-1 drink a day for women who are not pregnant. 0-2 drinks a day for men. Know how much alcohol is in your drink. In the [place] one drink equals one 12 oz bottle of beer (355mL), one 5 oz glass of wine (148mL), or one 1 oz glass of hard liquor (44mL). Do not smoke or use any products that have nicotine or tobacco. If you need help quitting, ask your doctor. Avoid secondhand smoke. Do not use drugs. Activity  Try to stay at a healthy   weight. Get at least 30 minutes of exercise on most days, such as: Fast walking. Biking. Swimming.  Medicines Take over-the-counter and prescription medicines only as told by your doctor. Avoid taking birth control pills. Talk to your doctor about the risks of taking birth control pills if: You are over 35 years old. You smoke. You  get very bad headaches. You have had a blood clot. Where to find more information American Stroke Association: www.strokeassociation.org Get help right away if: You or a loved one has any signs of a stroke. "BE FAST" is an easy way to remember the warning signs: B - Balance. Dizziness, sudden trouble walking, or loss of balance. E - Eyes. Trouble seeing or a change in how you see. F - Face. Sudden weakness or loss of feeling of the face. The face or eyelid may droop on one side. A - Arms. Weakness or loss of feeling in an arm. This happens all of a sudden and most often on one side of the body. S - Speech. Sudden trouble speaking, slurred speech, or trouble understanding what people say. T - Time. Time to call emergency services. Write down what time symptoms started. You or a loved one has other signs of a stroke, such as: A sudden, very bad headache with no known cause. Feeling like you may vomit (nausea). Vomiting. A seizure. These symptoms may be an emergency. Get help right away. Call your local emergency services (911 in the [place] Do not wait to see if the symptoms will go away. Do not drive yourself to the hospital. Summary You can help to prevent a stroke by eating healthy, exercising, and not smoking. It also helps to manage any health problems you have. Do not smoke or use any products that contain nicotine or tobacco. Get help right away if you or a loved one has any signs of a stroke. This information is not intended to replace advice given to you by your health care provider. Make sure you discuss any questions you have with your healthcare provider. Document Revised: 02/16/2020 Document Reviewed: 02/16/2020 Elsevier Patient Education  2022 Elsevier Inc.  

## 2021-08-25 ENCOUNTER — Ambulatory Visit (INDEPENDENT_AMBULATORY_CARE_PROVIDER_SITE_OTHER): Payer: Medicare HMO | Admitting: Nurse Practitioner

## 2021-08-25 ENCOUNTER — Encounter: Payer: Self-pay | Admitting: Nurse Practitioner

## 2021-08-25 ENCOUNTER — Other Ambulatory Visit: Payer: Self-pay

## 2021-08-25 VITALS — BP 160/110 | HR 118 | Resp 17 | Ht 72.0 in | Wt 305.0 lb

## 2021-08-25 DIAGNOSIS — E782 Mixed hyperlipidemia: Secondary | ICD-10-CM

## 2021-08-25 DIAGNOSIS — I69391 Dysphagia following cerebral infarction: Secondary | ICD-10-CM

## 2021-08-25 DIAGNOSIS — I639 Cerebral infarction, unspecified: Secondary | ICD-10-CM

## 2021-08-25 DIAGNOSIS — G4700 Insomnia, unspecified: Secondary | ICD-10-CM

## 2021-08-25 DIAGNOSIS — Z1211 Encounter for screening for malignant neoplasm of colon: Secondary | ICD-10-CM | POA: Diagnosis not present

## 2021-08-25 DIAGNOSIS — N400 Enlarged prostate without lower urinary tract symptoms: Secondary | ICD-10-CM

## 2021-08-25 DIAGNOSIS — E119 Type 2 diabetes mellitus without complications: Secondary | ICD-10-CM | POA: Insufficient documentation

## 2021-08-25 DIAGNOSIS — N1832 Chronic kidney disease, stage 3b: Secondary | ICD-10-CM

## 2021-08-25 DIAGNOSIS — I10 Essential (primary) hypertension: Secondary | ICD-10-CM | POA: Diagnosis not present

## 2021-08-25 DIAGNOSIS — E1122 Type 2 diabetes mellitus with diabetic chronic kidney disease: Secondary | ICD-10-CM | POA: Diagnosis not present

## 2021-08-25 DIAGNOSIS — Z6841 Body Mass Index (BMI) 40.0 and over, adult: Secondary | ICD-10-CM | POA: Diagnosis not present

## 2021-08-25 DIAGNOSIS — N183 Chronic kidney disease, stage 3 unspecified: Secondary | ICD-10-CM | POA: Insufficient documentation

## 2021-08-25 DIAGNOSIS — E118 Type 2 diabetes mellitus with unspecified complications: Secondary | ICD-10-CM | POA: Diagnosis not present

## 2021-08-25 MED ORDER — TAMSULOSIN HCL 0.4 MG PO CAPS: 0.4000 mg | ORAL_CAPSULE | Freq: Every day | ORAL | 1 refills | Status: DC

## 2021-08-25 MED ORDER — ATORVASTATIN CALCIUM 40 MG PO TABS: 40.0000 mg | ORAL_TABLET | Freq: Every day | ORAL | 1 refills | Status: DC

## 2021-08-25 MED ORDER — HYDRALAZINE HCL 25 MG PO TABS: 25.0000 mg | ORAL_TABLET | Freq: Three times a day (TID) | ORAL | 1 refills | Status: DC

## 2021-08-25 MED ORDER — AMLODIPINE BESYLATE 10 MG PO TABS: 10.0000 mg | ORAL_TABLET | Freq: Every day | ORAL | 3 refills | Status: DC

## 2021-08-25 MED ORDER — FINASTERIDE 5 MG PO TABS: 5.0000 mg | ORAL_TABLET | Freq: Every day | ORAL | 1 refills | Status: DC

## 2021-08-25 NOTE — Assessment & Plan Note (Signed)
DASH diet and commitment to daily physical activity for a minimum of 30 minutes discussed and encouraged, as a part of hypertension management. The importance of attaining a healthy weight is also discussed.  BP/Weight 08/25/2021 08/18/2021 06/16/2021 06/07/2021 05/20/2021 05/20/2021 05/17/2021  Systolic BP 160 190 157 147 - 263 -  Diastolic BP 110 117 87 79 - 89 -  Wt. (Lbs) 305 304 319.2 - 325.4 - 319.89  BMI 41.37 41.23 43.29 - 44.13 - 43.38  Takes clonidine 0.2 mg 3 times daily, amlodipine 10 mg daily. Start hydralazine 25 mg TID. Recheck blood pressure in 4 weeks.

## 2021-08-25 NOTE — Assessment & Plan Note (Signed)
Patient states that he sees speech therapist 2 to 3 days weekly.

## 2021-08-25 NOTE — Assessment & Plan Note (Addendum)
Lab Results  Component Value Date   HGBA1C 6.6 (H) 05/17/2021  Currently not on medication., will consider amaryl once labs are back. he was previously taking metformin but dc due to decreased renal function.  ON statin.  Not on ACE/ARB due to kidney function. Pt referred to nephrology today Avoid sugar, sweets, soda A1c ordered

## 2021-08-25 NOTE — Assessment & Plan Note (Signed)
Last lipids Lab Results  Component Value Date   CHOL 191 05/17/2021   HDL 40 (L) 05/17/2021   LDLCALC 139 (H) 05/17/2021   TRIG 61 05/17/2021   CHOLHDL 4.8 05/17/2021  Continue atorvastatin 40 mg daily.  Patient will come back for blood work tomorrow he has eating lunch today.

## 2021-08-25 NOTE — Assessment & Plan Note (Signed)
Patient referred to nephrology. Avoid nsaids drink plenty of water to stay hydrated Lab Results  Component Value Date   NA 137 06/06/2021   K 4.0 06/06/2021   CO2 28 06/06/2021   GLUCOSE 124 (H) 06/06/2021   BUN 33 (H) 06/06/2021   CREATININE 2.15 (H) 06/06/2021   CALCIUM 8.6 (L) 06/06/2021   GFRNONAA 36 (L) 06/06/2021

## 2021-08-25 NOTE — Patient Instructions (Addendum)
Please get your labs done tomorrow as I discussed with you Please  get your tdap , shingles and COVID vaccine at your pharmacy.  Please schedule patient for diabetic eye exam  Take hydralazine 25 mg 3 times daily for your blood pressure, continue your other blood pressure medications as you have been taking them.   It is important that you exercise regularly at least 30 minutes 5 times a week.  Think about what you will eat, plan ahead. Choose " clean, green, fresh or frozen" over canned, processed or packaged foods which are more sugary, salty and fatty. 70 to 75% of food eaten should be vegetables and fruit. Three meals at set times with snacks allowed between meals, but they must be fruit or vegetables. Aim to eat over a 12 hour period , example 7 am to 7 pm, and STOP after  your last meal of the day. Drink water,generally about 64 ounces per day, no other drink is as healthy. Fruit juice is best enjoyed in a healthy way, by EATING the fruit.  Thanks for choosing [facility], we consider it a privelige to serve you.

## 2021-08-25 NOTE — Assessment & Plan Note (Signed)
Followed by neurology takes Lipitor 40 mg daily  Plavix 75 mg daily.

## 2021-08-25 NOTE — Progress Notes (Signed)
New Patient Office Visit  Subjective:  Patient ID: Brandon French, male    DOB: 16-Dec-1965  Age: 56 y.o. MRN: [id number]  CC:  Chief Complaint  Patient presents with   Establish Care    Dr's office in [place] is where pt is transferring from    HPI Brandon French presents to establish care, previous PCP was in  [place] , he can not remember name of his last PCP.  He does not know when his last annual physical was.  Pt states that he snores, would like a sleep study.  Patient was referred for sleep study by his neurologist. Patient stated that he has had 2 strokes, most recent one was in October ,2022.   Patient is due vaccination for  Tdap vaccine, shingles vaccine and COVID booster vaccine.  Need for  recommended vaccination discussed with patient he verbalized understanding.   Past Medical History:  Diagnosis Date   Chronic kidney disease    Diabetes mellitus (HCC)    Elevated cholesterol    High blood pressure     History reviewed. No pertinent surgical history.  Family History  Problem Relation Age of Onset   Diabetes Mother    Cancer Mother    Diabetes Father    Kidney failure Father    Colon cancer Half-Brother    Lung cancer Half-Brother     Social History   Socioeconomic History   Marital status: Divorced    Spouse name: Not on file   Number of children: 2   Years of education: Not on file   Highest education level: Not on file  Occupational History   Not on file  Tobacco Use   Smoking status: Former    Types: Cigarettes    Quit date: 2011    Years since quitting: 12.0   Smokeless tobacco: Never   Tobacco comments:    Pt stated that he smoked for about 8 years , he quit 2011.  Vaping Use   Vaping Use: Never used  Substance and Sexual Activity   Alcohol use: Never   Drug use: Never   Sexual activity: Not Currently  Other Topics Concern   Not on file  Social History Narrative   Pt lives with his friends.    Social Determinants of Health    Financial Resource Strain: Not on file  Food Insecurity: Not on file  Transportation Needs: Not on file  Physical Activity: Not on file  Stress: Not on file  Social Connections: Not on file  Intimate Partner Violence: Not on file    ROS Review of Systems  Constitutional: Negative.   Respiratory: Negative.    Cardiovascular: Negative.   Musculoskeletal:  Positive for gait problem.       Uses a cane.   Psychiatric/Behavioral: Negative.     Objective:   Today's Vitals: BP (!) 160/110    Pulse (!) 118    Resp 17    Ht 6' (1.829 m)    Wt (!) 305 lb (138.3 kg)    SpO2 97%    BMI 41.37 kg/m   Physical Exam Constitutional:      General: He is not in acute distress.    Appearance: He is obese. He is not ill-appearing, toxic-appearing or diaphoretic.  Cardiovascular:     Rate and Rhythm: Normal rate and regular rhythm.     Pulses: Normal pulses.     Heart sounds: Normal heart sounds. No murmur heard.   No friction rub. No  gallop.  Pulmonary:     Effort: No respiratory distress.     Breath sounds: No stridor. No wheezing, rhonchi or rales.  Chest:     Chest wall: No tenderness.  Abdominal:     Palpations: Abdomen is soft.  Neurological:     Gait: Gait abnormal.     Comments: Power 5/5 in all extremities, uses a cane to ambulate.  Speech is understandable, moderate dysarthria.   Psychiatric:        Mood and Affect: Mood normal.        Behavior: Behavior normal.        Thought Content: Thought content normal.        Judgment: Judgment normal.    Assessment & Plan:   Problem List Items Addressed This Visit       Cardiovascular and Mediastinum   HTN (hypertension)   Relevant Medications   hydrALAZINE (APRESOLINE) 25 MG tablet   atorvastatin (LIPITOR) 40 MG tablet   amLODipine (NORVASC) 10 MG tablet   Other Relevant Orders   AMB Referral to [facility]     Endocrine   Controlled type 2 diabetes mellitus with complication, without long-term current  use of insulin (HCC)   Relevant Medications   atorvastatin (LIPITOR) 40 MG tablet   Other Relevant Orders   HgB A1c     Genitourinary   Renal failure - Primary   Relevant Orders   Ambulatory referral to Nephrology   CBC with Differential   CMP14+EGFR   AMB Referral to [place]     Other   HLD (hyperlipidemia)   Relevant Medications   hydrALAZINE (APRESOLINE) 25 MG tablet   atorvastatin (LIPITOR) 40 MG tablet   amLODipine (NORVASC) 10 MG tablet   Other Relevant Orders   Lipid Profile   Other Visit Diagnoses     Screening for colon cancer       Relevant Orders   Ambulatory referral to Gastroenterology   Benign prostatic hyperplasia without lower urinary tract symptoms       Relevant Medications   tamsulosin (FLOMAX) 0.4 MG CAPS capsule       Outpatient Encounter Medications as of 08/25/2021  Medication Sig   acetaminophen (TYLENOL) 325 MG tablet Take 1-2 tablets (325-650 mg total) by mouth every 4 (four) hours as needed for mild pain.   cloNIDine (CATAPRES) 0.2 MG tablet Take 1 tablet (0.2 mg total) by mouth 3 (three) times daily.   clopidogrel (PLAVIX) 75 MG tablet Take 1 tablet (75 mg total) by mouth daily. Please take Aspirin 81 mg daily along with Plavix 75 mg daily for 30 days. STOP the aspirin after   food thickener (SIMPLYTHICK, HONEY/LEVEL 3/MODERATELY THICK,) LIQD Use one packet to thicken any liquids to honey consistency.   gabapentin (NEURONTIN) 100 MG capsule Take 1 capsule (100 mg total) by mouth at bedtime.   hydrALAZINE (APRESOLINE) 25 MG tablet Take 1 tablet (25 mg total) by mouth 3 (three) times daily.   pantoprazole (PROTONIX) 40 MG tablet Take 1 tablet (40 mg total) by mouth daily.   senna-docusate (SENOKOT-S) 8.6-50 MG tablet Take 2 tablets by mouth 2 (two) times daily.   traZODone (DESYREL) 50 MG tablet Take 1 tablet (50 mg total) by mouth at bedtime.   [DISCONTINUED] amLODipine (NORVASC) 10 MG tablet Take 1 tablet (10 mg total) by  mouth daily.   [DISCONTINUED] atorvastatin (LIPITOR) 40 MG tablet Take 1 tablet (40 mg total) by mouth daily after supper. For Stroke Prevention  amLODipine (NORVASC) 10 MG tablet Take 1 tablet (10 mg total) by mouth daily.   aspirin 81 MG EC tablet Take 1 tablet (81 mg total) by mouth daily with breakfast. Please take Aspirin 81 mg daily till 11/21 then stop. (Patient not taking: Reported on 08/25/2021)   atorvastatin (LIPITOR) 40 MG tablet Take 1 tablet (40 mg total) by mouth daily after supper. For Stroke Prevention   diclofenac Sodium (VOLTAREN) 1 % GEL APPLY 2 G TOPICALLY 4 (FOUR) TIMES DAILY. TO RIGHT TOE/FOOT (Patient not taking: Reported on 08/25/2021)   finasteride (PROSCAR) 5 MG tablet Take 1 tablet (5 mg total) by mouth daily. (Patient not taking: Reported on 08/25/2021)   tamsulosin (FLOMAX) 0.4 MG CAPS capsule Take 1 capsule (0.4 mg total) by mouth daily.   [DISCONTINUED] tamsulosin (FLOMAX) 0.4 MG CAPS capsule Take 1 capsule (0.4 mg total) by mouth daily. (Patient not taking: Reported on 08/25/2021)   No facility-administered encounter medications on file as of 08/25/2021.    Follow-up: Return in about 4 weeks (around 09/22/2021) for follow up for blood preesure .   Renee Rival, FNP

## 2021-08-25 NOTE — Assessment & Plan Note (Addendum)
Refill tamsulosin 0.4 mg capsule daily Proscar 5 mg daily

## 2021-08-25 NOTE — Assessment & Plan Note (Signed)
Takes trazodone 50 mg daily at bedtime.

## 2021-08-26 DIAGNOSIS — E119 Type 2 diabetes mellitus without complications: Secondary | ICD-10-CM | POA: Diagnosis not present

## 2021-08-26 DIAGNOSIS — N1832 Chronic kidney disease, stage 3b: Secondary | ICD-10-CM | POA: Diagnosis not present

## 2021-08-26 DIAGNOSIS — E785 Hyperlipidemia, unspecified: Secondary | ICD-10-CM | POA: Diagnosis not present

## 2021-08-26 DIAGNOSIS — E782 Mixed hyperlipidemia: Secondary | ICD-10-CM | POA: Diagnosis not present

## 2021-08-26 DIAGNOSIS — E118 Type 2 diabetes mellitus with unspecified complications: Secondary | ICD-10-CM | POA: Diagnosis not present

## 2021-08-27 ENCOUNTER — Other Ambulatory Visit: Payer: Self-pay | Admitting: Nurse Practitioner

## 2021-08-27 DIAGNOSIS — E118 Type 2 diabetes mellitus with unspecified complications: Secondary | ICD-10-CM

## 2021-08-27 DIAGNOSIS — D509 Iron deficiency anemia, unspecified: Secondary | ICD-10-CM

## 2021-08-27 DIAGNOSIS — E782 Mixed hyperlipidemia: Secondary | ICD-10-CM

## 2021-08-27 LAB — CMP14+EGFR
ALT: 9 IU/L (ref 0–44)
AST: 15 IU/L (ref 0–40)
Albumin/Globulin Ratio: 1.3 (ref 1.2–2.2)
Albumin: 3.8 g/dL (ref 3.8–4.9)
Alkaline Phosphatase: 68 IU/L (ref 44–121)
BUN/Creatinine Ratio: 15 (ref 9–20)
BUN: 28 mg/dL — ABNORMAL HIGH (ref 6–24)
Bilirubin Total: 0.4 mg/dL (ref 0.0–1.2)
CO2: 25 mmol/L (ref 20–29)
Calcium: 9.1 mg/dL (ref 8.7–10.2)
Chloride: 103 mmol/L (ref 96–106)
Creatinine, Ser: 1.9 mg/dL — ABNORMAL HIGH (ref 0.76–1.27)
Globulin, Total: 3 g/dL (ref 1.5–4.5)
Glucose: 150 mg/dL — ABNORMAL HIGH (ref 70–99)
Potassium: 4 mmol/L (ref 3.5–5.2)
Sodium: 142 mmol/L (ref 134–144)
Total Protein: 6.8 g/dL (ref 6.0–8.5)
eGFR: 41 mL/min/{1.73_m2} — ABNORMAL LOW (ref >59)

## 2021-08-27 LAB — HEMOGLOBIN A1C
Est. average glucose Bld gHb Est-mCnc: 160 mg/dL
Hgb A1c MFr Bld: 7.2 % — ABNORMAL HIGH (ref 4.8–5.6)

## 2021-08-27 LAB — LIPID PANEL
Chol/HDL Ratio: 4.3 ratio (ref 0.0–5.0)
Cholesterol, Total: 141 mg/dL (ref 100–199)
HDL: 33 mg/dL — ABNORMAL LOW (ref >39)
LDL Chol Calc (NIH): 90 mg/dL (ref 0–99)
Triglycerides: 98 mg/dL (ref 0–149)
VLDL Cholesterol Cal: 18 mg/dL (ref 5–40)

## 2021-08-27 LAB — CBC WITH DIFFERENTIAL/PLATELET
Basophils Absolute: 0.1 10*3/uL (ref 0.0–0.2)
Basos: 1 %
EOS (ABSOLUTE): 0.1 10*3/uL (ref 0.0–0.4)
Eos: 2 %
Hematocrit: 36.5 % — ABNORMAL LOW (ref 37.5–51.0)
Hemoglobin: 11.6 g/dL — ABNORMAL LOW (ref 13.0–17.7)
Immature Grans (Abs): 0 10*3/uL (ref 0.0–0.1)
Immature Granulocytes: 0 %
Lymphocytes Absolute: 1.7 10*3/uL (ref 0.7–3.1)
Lymphs: 24 %
MCH: 24.6 pg — ABNORMAL LOW (ref 26.6–33.0)
MCHC: 31.8 g/dL (ref 31.5–35.7)
MCV: 77 fL — ABNORMAL LOW (ref 79–97)
Monocytes Absolute: 0.5 10*3/uL (ref 0.1–0.9)
Monocytes: 7 %
Neutrophils Absolute: 4.5 10*3/uL (ref 1.4–7.0)
Neutrophils: 66 %
Platelets: 397 10*3/uL (ref 150–450)
RBC: 4.72 x10E6/uL (ref 4.14–5.80)
RDW: 15.2 % (ref 11.6–15.4)
WBC: 6.9 10*3/uL (ref 3.4–10.8)

## 2021-08-27 MED ORDER — GLIMEPIRIDE 1 MG PO TABS: 1.0000 mg | ORAL_TABLET | Freq: Every day | ORAL | 3 refills | Status: DC

## 2021-08-27 MED ORDER — ATORVASTATIN CALCIUM 80 MG PO TABS: 80.0000 mg | ORAL_TABLET | Freq: Every day | ORAL | 0 refills | Status: DC

## 2021-08-27 NOTE — Progress Notes (Signed)
Pls review labs with pt. Start taking amaryl 1mg  daily for his DM. Start taking atorvastatin 80mg  daily for his LDL, dose increased from 40mg  daily I added on iron panel to his labs, pls follow up with lab on this Pt should follow  up with me as planned. 

## 2021-08-29 ENCOUNTER — Telehealth: Payer: Self-pay

## 2021-08-29 NOTE — Telephone Encounter (Signed)
° °  Telephone encounter was:  Successful.  08/29/2021 Name: Brandon French MRN: [id number] DOB: [birth year]  [name] is a 56 y.o. year old male who is a primary care patient of Donell Beers, FNP . The community resource team was consulted for assistance with Transportation Needs   Care guide performed the following interventions: [facility] [employer] is unable to accomodate patient because 8:30pm is outside of their operating hours and they have to receive 48 hours notice.  Contacted [employer] and they are unable to accomodate since 8:30 pm is outside of ther operating hours.  Spoke with the patient and explained that [facility] nor [employer] transportation operated after 5:00pm.  I asked [name] if there was someone that could take him and he stated his father could take him to his appointment tomorrow.  He was aware of the location and time of his appointment. Gave him the number for [employer] [phone] for future appointments..  Follow Up Plan:  No further follow up planned at this time. The patient has been provided with needed resources. and [employer], scheduled ride for 09/08/21 2:30pm appointment with Sheria Lang A. Caffaro at [facility]. Spoke with patient let him know he would be picked up between 1:30pm-1:45pm, [place] number [phone], number to call for ride home [phone].  [name], AAS Paralegal, [facility] Care Guide  Embedded Care Coordination [place]   Care Management  [address] [place], [place] [address] ??[email or web address]   ?? [phone]   [email or web address]

## 2021-08-29 NOTE — Telephone Encounter (Signed)
° °  Telephone encounter was:  Successful.  08/29/2021 Name: Brandon French MRN: [id number] DOB: 02/19/1966  Brandon French is a 56 y.o. year old male who is a primary care patient of Donell Beers, FNP . The community resource team was consulted for assistance with Transportation Needs   Care guide performed the following interventions: [facility] [employer] is unable to accomodate patient because 8:30pm is outside of their operating hours and they have to receive 48 hours notice.  Contacted [employer] and they are unable to accomodate since 8:30 pm is outside of ther operating hours.  Spoke with the patient and explained that [facility] nor [employer] transportation operated after 5:00pm.  I asked [name] if there was someone that could take him and he stated his father could take him to his appointment tomorrow.  He was aware of the location and time of his appointment. Gave him the number for [employer] [phone] for future appointments.  Follow Up Plan:  No further follow up planned at this time. The patient has been provided with needed resources.  Cecia Egge, AAS Paralegal, [facility] Care Guide  Embedded Care Coordination [place]   Care Management  [address] [place], [place] [address] ??[email or web address]   ?? [phone]   [email or web address]

## 2021-08-29 NOTE — Telephone Encounter (Signed)
° °  Telephone encounter was:  Successful.  08/29/2021 Name: [name] MRN: [id number] DOB: 11/07/1965  Brandon French is a 56 y.o. year old male who is a primary care patient of Donell Beers, FNP . The community resource team was consulted for assistance with Transportation Needs   Care guide performed the following interventions: Spoke with patient to verifiy information for [facility] transportation request. Emailed request for 08/30/21 at 8:30 pm appointment at [facility].  Follow Up Plan:   I will call the patient when I receive a response from [facility].  Brandon French, AAS Paralegal, [facility] Care Guide  Embedded Care Coordination [place]   Care Management  [address] [place], [place] [address] ??[email or web address]   ?? [phone]   [email or web address]

## 2021-08-30 ENCOUNTER — Ambulatory Visit: Payer: Medicare HMO | Admitting: Neurology

## 2021-08-30 ENCOUNTER — Other Ambulatory Visit: Payer: Self-pay

## 2021-08-30 DIAGNOSIS — G4733 Obstructive sleep apnea (adult) (pediatric): Secondary | ICD-10-CM

## 2021-08-31 ENCOUNTER — Encounter: Payer: Self-pay | Admitting: Internal Medicine

## 2021-09-01 ENCOUNTER — Ambulatory Visit (INDEPENDENT_AMBULATORY_CARE_PROVIDER_SITE_OTHER): Payer: Medicare HMO

## 2021-09-01 ENCOUNTER — Other Ambulatory Visit: Payer: Self-pay

## 2021-09-01 DIAGNOSIS — Z0000 Encounter for general adult medical examination without abnormal findings: Secondary | ICD-10-CM

## 2021-09-01 DIAGNOSIS — Z1211 Encounter for screening for malignant neoplasm of colon: Secondary | ICD-10-CM

## 2021-09-01 NOTE — Progress Notes (Addendum)
Subjective:   Brandon French is a 56 y.o. male who presents for Medicare Annual/Subsequent preventive examination. I connected with  Mathis Dad on 09/01/21 by a audio enabled telemedicine application and verified that I am speaking with the correct person using two identifiers.  Patient Location: Home  Provider Location: Office/Clinic  I discussed the limitations of evaluation and management by telemedicine. The patient expressed understanding and agreed to proceed.  Review of Systems    Defer to PCP Cardiac Risk Factors include: male gender;hypertension;dyslipidemia;diabetes mellitus     Objective:    There were no vitals filed for this visit. There is no height or weight on file to calculate BMI.  Advanced Directives 09/01/2021 05/20/2021 05/17/2021  Does Patient Have a Medical Advance Directive? No No No  Would patient like information on creating a medical advance directive? Yes (ED - Information included in AVS) No - Patient declined No - Patient declined    Current Medications (verified) Outpatient Encounter Medications as of 09/01/2021  Medication Sig   acetaminophen (TYLENOL) 325 MG tablet Take 1-2 tablets (325-650 mg total) by mouth every 4 (four) hours as needed for mild pain.   amLODipine (NORVASC) 10 MG tablet Take 1 tablet (10 mg total) by mouth daily.   aspirin 81 MG EC tablet Take 1 tablet (81 mg total) by mouth daily with breakfast. Please take Aspirin 81 mg daily till 11/21 then stop.   atorvastatin (LIPITOR) 80 MG tablet Take 1 tablet (80 mg total) by mouth daily.   cloNIDine (CATAPRES) 0.2 MG tablet Take 1 tablet (0.2 mg total) by mouth 3 (three) times daily.   clopidogrel (PLAVIX) 75 MG tablet Take 1 tablet (75 mg total) by mouth daily. Please take Aspirin 81 mg daily along with Plavix 75 mg daily for 30 days. STOP the aspirin after   diclofenac Sodium (VOLTAREN) 1 % GEL APPLY 2 G TOPICALLY 4 (FOUR) TIMES DAILY. TO RIGHT TOE/FOOT   finasteride (PROSCAR) 5 MG  tablet Take 1 tablet (5 mg total) by mouth daily.   food thickener (SIMPLYTHICK, HONEY/LEVEL 3/MODERATELY THICK,) LIQD Use one packet to thicken any liquids to honey consistency.   gabapentin (NEURONTIN) 100 MG capsule Take 1 capsule (100 mg total) by mouth at bedtime.   glimepiride (AMARYL) 1 MG tablet Take 1 tablet (1 mg total) by mouth daily with breakfast.   hydrALAZINE (APRESOLINE) 25 MG tablet Take 1 tablet (25 mg total) by mouth 3 (three) times daily.   pantoprazole (PROTONIX) 40 MG tablet Take 1 tablet (40 mg total) by mouth daily.   senna-docusate (SENOKOT-S) 8.6-50 MG tablet Take 2 tablets by mouth 2 (two) times daily.   tamsulosin (FLOMAX) 0.4 MG CAPS capsule Take 1 capsule (0.4 mg total) by mouth daily.   traZODone (DESYREL) 50 MG tablet Take 1 tablet (50 mg total) by mouth at bedtime.   No facility-administered encounter medications on file as of 09/01/2021.    Allergies (verified) Aleve [naproxen]   History: Past Medical History:  Diagnosis Date   Chronic kidney disease    Diabetes mellitus (HCC)    Elevated cholesterol    High blood pressure    Past Surgical History:  Procedure Laterality Date   TRANSURETHRAL RESECTION OF PROSTATE  2020   Family History  Problem Relation Age of Onset   Stroke Mother    Diabetes Mother    Cancer Mother    Diabetes Father    Kidney failure Father    Colon cancer Half-Brother  Lung cancer Half-Brother    Social History   Socioeconomic History   Marital status: Divorced    Spouse name: Not on file   Number of children: 2   Years of education: 11   Highest education level: Not on file  Occupational History   Not on file  Tobacco Use   Smoking status: Former    Packs/day: 0.50    Years: 8.00    Pack years: 4.00    Types: Cigarettes    Quit date: 2011    Years since quitting: 12.0   Smokeless tobacco: Never   Tobacco comments:    Pt stated that he smoked for about 8 years , he quit 2011.  Vaping Use   Vaping Use:  Never used  Substance and Sexual Activity   Alcohol use: Never   Drug use: Never   Sexual activity: Not Currently  Other Topics Concern   Not on file  Social History Narrative   Pt lives with his friends.    Social Determinants of Health   Financial Resource Strain: Low Risk    Difficulty of Paying Living Expenses: Not hard at all  Food Insecurity: No Food Insecurity   Worried About Charity fundraiser in the Last Year: Never true   [place] in the Last Year: Never true  Transportation Needs: No Transportation Needs   Lack of Transportation (Medical): No   Lack of Transportation (Non-Medical): No  Physical Activity: Sufficiently Active   Days of Exercise per Week: 4 days   Minutes of Exercise per Session: 50 min  Stress: No Stress Concern Present   Feeling of Stress : Not at all  Social Connections: Socially Isolated   Frequency of Communication with Friends and Family: More than three times a week   Frequency of Social Gatherings with Friends and Family: Never   Attends Religious Services: Never   Marine scientist or Organizations: No   Attends Archivist Meetings: Never   Marital Status: Divorced    Tobacco Counseling Counseling given: Not Answered Tobacco comments: Pt stated that he smoked for about 8 years , he quit 2011.   Clinical Intake:  Pre-visit preparation completed: No  Pain : No/denies pain     Nutritional Risks: None Diabetes: Yes CBG done?: No Did pt. bring in CBG monitor from home?: No  How often do you need to have someone help you when you read instructions, pamphlets, or other written materials from your doctor or pharmacy?: 1 - Never What is the last grade level you completed in school?: 11  Diabetic?Nutrition Risk Assessment:  Has the patient had any N/V/D within the last 2 months?  No  Does the patient have any non-healing wounds?  No  Has the patient had any unintentional weight loss or weight gain?  No    Diabetes:  Is the patient diabetic?  Yes  If diabetic, was a CBG obtained today?  No  Did the patient bring in their glucometer from home?  No  How often do you monitor your CBG's? daily.   Financial Strains and Diabetes Management:  Are you having any financial strains with the device, your supplies or your medication? No .  Does the patient want to be seen by Chronic Care Management for management of their diabetes?  No  Would the patient like to be referred to a Nutritionist or for Diabetic Management?  No   Diabetic Exams:  Diabetic Eye Exam: Overdue for diabetic eye  exam. Pt has been advised about the importance in completing this exam. Patient advised to call and schedule an eye exam. Diabetic Foot Exam: Overdue, Pt has been advised about the importance in completing this exam. Pt is scheduled for diabetic foot exam on 09/27/2021.   Interpreter Needed?: No  Information entered by :: Judeen Hammans   Activities of Daily Living In your present state of health, do you have any difficulty performing the following activities: 09/01/2021 05/31/2021  Hearing? N N  Vision? N N  Difficulty concentrating or making decisions? N N  Walking or climbing stairs? Y Y  Dressing or bathing? N N  Doing errands, shopping? N Y  Conservation officer, nature and eating ? N -  Using the Toilet? N -  In the past six months, have you accidently leaked urine? N -  Do you have problems with loss of bowel control? N -  Managing your Medications? N -  Managing your Finances? N -  Housekeeping or managing your Housekeeping? N -    Patient Care Team: Renee Rival, FNP as PCP - General (Nurse Practitioner)  Indicate any recent Medical Services you may have received from other than [facility] providers in the past year (date may be approximate).     Assessment:   This is a routine wellness examination for Brandon French.  Hearing/Vision screen No results found.  Dietary issues and exercise activities discussed: Current  Exercise Habits: Home exercise routine, Type of exercise: walking, Time (Minutes): 60, Frequency (Times/Week): 3, Weekly Exercise (Minutes/Week): 180, Intensity: Mild, Exercise limited by: cardiac condition(s)   Goals Addressed   None   Depression Screen PHQ 2/9 Scores 09/01/2021 08/25/2021 08/18/2021 06/16/2021  PHQ - 2 Score 0 0 0 0  PHQ- 9 Score 0 - 0 0    Fall Risk Fall Risk  09/01/2021 08/25/2021 08/18/2021  Falls in the past year? 0 - 0  Number falls in past yr: 0 0 0  Injury with Fall? 0 1 0  Risk for fall due to : Impaired balance/gait - -  Follow up Falls evaluation completed - -    FALL RISK PREVENTION PERTAINING TO THE HOME:  Any stairs in or around the home? No  If so, are there any without handrails? No  Home free of loose throw rugs in walkways, pet beds, electrical cords, etc? Yes  Adequate lighting in your home to reduce risk of falls? Yes   ASSISTIVE DEVICES UTILIZED TO PREVENT FALLS:  Life alert? No  Use of a cane, walker or w/c? Yes  Grab bars in the bathroom? No  Shower chair or bench in shower? Yes  Elevated toilet seat or a handicapped toilet? Yes    Cognitive Function:     6CIT Screen 09/01/2021  What Year? 0 points  What month? 0 points  What time? 0 points  Count back from 20 0 points  Months in reverse 0 points  Repeat phrase 0 points  Total Score 0    Immunizations Immunization History  Administered Date(s) Administered   Influenza,inj,Quad PF,6+ Mos 05/19/2021    TDAP status: Due, Education has been provided regarding the importance of this vaccine. Advised may receive this vaccine at local pharmacy or Health Dept. Aware to provide a copy of the vaccination record if obtained from local pharmacy or Health Dept. Verbalized acceptance and understanding.  Flu Vaccine status: Completed at today's visit  Pneumococcal vaccine status: Due, Education has been provided regarding the importance of this vaccine. Advised may receive this vaccine at  local pharmacy or Health Dept. Aware to provide a copy of the vaccination record if obtained from local pharmacy or Health Dept. Verbalized acceptance and understanding.  Covid-19 vaccine status: Declined, Education has been provided regarding the importance of this vaccine but patient still declined. Advised may receive this vaccine at local pharmacy or Health Dept.or vaccine clinic. Aware to provide a copy of the vaccination record if obtained from local pharmacy or Health Dept. Verbalized acceptance and understanding.  Qualifies for Shingles Vaccine? Yes   Zostavax completed No   Shingrix Completed?: No.    Education has been provided regarding the importance of this vaccine. Patient has been advised to call insurance company to determine out of pocket expense if they have not yet received this vaccine. Advised may also receive vaccine at local pharmacy or Health Dept. Verbalized acceptance and understanding.  Screening Tests Health Maintenance  Topic Date Due   COVID-19 Vaccine (1) Never done   FOOT EXAM  Never done   OPHTHALMOLOGY EXAM  Never done   Hepatitis C Screening  Never done   TETANUS/TDAP  Never done   COLONOSCOPY (Pts 45-57yrs Insurance coverage will need to be confirmed)  Never done   Zoster Vaccines- Shingrix (1 of 2) Never done   HEMOGLOBIN A1C  02/23/2022   INFLUENZA VACCINE  Completed   HIV Screening  Completed   HPV VACCINES  Aged Out    Health Maintenance  Health Maintenance Due  Topic Date Due   COVID-19 Vaccine (1) Never done   FOOT EXAM  Never done   OPHTHALMOLOGY EXAM  Never done   Hepatitis C Screening  Never done   TETANUS/TDAP  Never done   COLONOSCOPY (Pts 45-25yrs Insurance coverage will need to be confirmed)  Never done   Zoster Vaccines- Shingrix (1 of 2) Never done    Colorectal cancer screening: Referral to GI placed COLOGUARD SENT TO PT. Pt aware the office will call re: appt.  Lung Cancer Screening: (Low Dose CT Chest recommended if Age  8-80 years, 30 pack-year currently smoking OR have quit w/in 15years.) does not qualify.   Lung Cancer Screening Referral: n/a  Additional Screening:  Hepatitis C Screening: does qualify; Completed 09/27/2021  Vision Screening: Recommended annual ophthalmology exams for early detection of glaucoma and other disorders of the eye. Is the patient up to date with their annual eye exam?  No  Who is the provider or what is the name of the office in which the patient attends annual eye exams? Pt does not have one If pt is not established with a provider, would they like to be referred to a provider to establish care?  Pt refused eye dr referral .   Dental Screening: Recommended annual dental exams for proper oral hygiene  Community Resource Referral / Chronic Care Management: CRR required this visit?  No   CCM required this visit?  No      Plan:     I have personally reviewed and noted the following in the patients chart:   Medical and social history Use of alcohol, tobacco or illicit drugs  Current medications and supplements including opioid prescriptions. Patient is not currently taking opioid prescriptions. Functional ability and status Nutritional status Physical activity Advanced directives List of other physicians Hospitalizations, surgeries, and ER visits in previous 12 months Vitals Screenings to include cognitive, depression, and falls Referrals and appointments  In addition, I have reviewed and discussed with patient certain preventive protocols, quality metrics, and best practice recommendations. A  written personalized care plan for preventive services as well as general preventive health recommendations were provided to patient.     Earline Mayotte, [place]   09/01/2021   Nurse Notes:  Brandon French , Thank you for taking time to come for your Medicare Wellness Visit. I appreciate your ongoing commitment to your health goals. Please review the following plan we  discussed and let me know if I can assist you in the future.   These are the goals we discussed:  Goals   None     This is a list of the screening recommended for you and due dates:  Health Maintenance  Topic Date Due   COVID-19 Vaccine (1) Never done   Complete foot exam   Never done   Eye exam for diabetics  Never done   Hepatitis C Screening: USPSTF Recommendation to screen - Ages 64-79 yo.  Never done   Tetanus Vaccine  Never done   Colon Cancer Screening  Never done   Zoster (Shingles) Vaccine (1 of 2) Never done   Hemoglobin A1C  02/23/2022   Flu Shot  Completed   HIV Screening  Completed   HPV Vaccine  Aged Out

## 2021-09-01 NOTE — Patient Instructions (Signed)

## 2021-09-08 ENCOUNTER — Encounter (HOSPITAL_COMMUNITY): Payer: Self-pay | Admitting: Physical Therapy

## 2021-09-08 ENCOUNTER — Other Ambulatory Visit: Payer: Self-pay

## 2021-09-08 ENCOUNTER — Ambulatory Visit (HOSPITAL_COMMUNITY): Payer: Medicare HMO | Attending: Physical Medicine & Rehabilitation | Admitting: Physical Therapy

## 2021-09-08 DIAGNOSIS — M6281 Muscle weakness (generalized): Secondary | ICD-10-CM | POA: Insufficient documentation

## 2021-09-08 DIAGNOSIS — R269 Unspecified abnormalities of gait and mobility: Secondary | ICD-10-CM | POA: Insufficient documentation

## 2021-09-08 DIAGNOSIS — I69398 Other sequelae of cerebral infarction: Secondary | ICD-10-CM | POA: Insufficient documentation

## 2021-09-08 DIAGNOSIS — R1314 Dysphagia, pharyngoesophageal phase: Secondary | ICD-10-CM | POA: Insufficient documentation

## 2021-09-08 DIAGNOSIS — R471 Dysarthria and anarthria: Secondary | ICD-10-CM | POA: Insufficient documentation

## 2021-09-08 DIAGNOSIS — R2689 Other abnormalities of gait and mobility: Secondary | ICD-10-CM | POA: Diagnosis not present

## 2021-09-08 NOTE — Patient Instructions (Signed)
Access Code: [email or web address] URL: [email or web address] Date: 09/08/2021 Prepared by: Georges Lynch  Exercises Seated March - 2-3 x daily - 7 x weekly - 2 sets - 10 reps Seated Long Arc Quad - 2-3 x daily - 7 x weekly - 2 sets - 10 reps Seated Heel Toe Raises - 2-3 x daily - 7 x weekly - 2 sets - 10 reps

## 2021-09-08 NOTE — Therapy (Signed)
[facility] [address] [place], [place], [address] Phone: [phone]   Fax:  [phone]  Physical Therapy Evaluation  Patient Details  Name: Brandon French MRN: [id number] Date of Birth: 03-16-1966 Referring Provider (PT): Claudette Laws MD   Encounter Date: 09/08/2021   PT End of Session - 09/08/21 1446     Visit Number 1    Number of Visits 12    Date for PT Re-Evaluation 10/20/21    Authorization Type [employer] Medicare    Authorization Time Period check auth    Authorization - Visit Number 1    Authorization - Number of Visits 1    Progress Note Due on Visit 10    PT Start Time 1435    PT Stop Time 1515    PT Time Calculation (min) 40 min    Activity Tolerance Patient tolerated treatment well    Behavior During Therapy [facility] for tasks assessed/performed             Past Medical History:  Diagnosis Date   Chronic kidney disease    Diabetes mellitus (HCC)    Elevated cholesterol    High blood pressure     Past Surgical History:  Procedure Laterality Date   TRANSURETHRAL RESECTION OF PROSTATE  2020    There were no vitals filed for this visit.    Subjective Assessment - 09/08/21 1445     Subjective Patient presents to therapy with complaint of LT side weakness after a stroke in October. He had a few weeks of home health therapy but says his insurance would no longer cover this. He presents today with complaint of ongoing weakness and difficulty walking. He is currently using a single point cane to assist with ambulation. He denies any recent falls.    Pertinent History CVA 04/1721    Limitations Standing;Walking;Lifting;House hold activities    Patient Stated Goals Improve strength and gait    Currently in Pain? No/denies                [facility] PT Assessment - 09/08/21 0001       Assessment   Medical Diagnosis LT side weakness s/p CVA    Referring Provider (PT) Claudette Laws MD    Onset  Date/Surgical Date 05/16/21    Next MD Visit 10/13/21      Precautions   Precautions Fall      Restrictions   Weight Bearing Restrictions No      Balance Screen   Has the patient fallen in the past 6 months No      Prior Function   Level of Independence Independent      Cognition   Overall Cognitive Status Within Functional Limits for tasks assessed      Observation/Other Assessments-Edema    Edema --   Moderate edema in bilateral LEs     ROM / Strength   AROM / PROM / Strength Strength      Strength   Strength Assessment Site Hip;Knee;Ankle    Right/Left Hip Right;Left    Right Hip Flexion 4+/5    Right Hip Extension 4+/5    Right Hip ABduction 4+/5    Left Hip Flexion 4-/5    Left Hip Extension 3+/5    Left Hip ABduction 3+/5    Right/Left Knee Right;Left    Right Knee Extension 4+/5    Left Knee Extension 4-/5    Right/Left Ankle Right;Left    Right Ankle Dorsiflexion 5/5  Left Ankle Dorsiflexion 4+/5      Transfers   Transfers Sit to Stand    Sit to Stand 6: Modified independent (Device/Increase time)    Comments slow labored, heavy use of RUE      Ambulation/Gait   Ambulation/Gait Yes    Ambulation/Gait Assistance 5: Supervision    Ambulation Distance (Feet) 90 Feet    Assistive device Straight cane    Gait Pattern Decreased stride length;Decreased arm swing - left;Decreased stance time - left    Ambulation Surface Level;Indoor    Gait Comments 2 MWT                        Objective measurements completed on examination: See above findings.                PT Education - 09/08/21 1446     Person(s) Educated Patient    Methods Explanation;Handout    Comprehension Verbalized understanding              PT Short Term Goals - 09/08/21 1510       PT SHORT TERM GOAL #1   Title Patient will be independent with initial HEP and self-management strategies to improve functional outcomes    Time 3    Period Weeks     Status New    Target Date 09/29/21      PT SHORT TERM GOAL #2   Title Patient will be able to perform stand x 5 in < 30 seconds to demonstrate improvement in functional mobility and reduced risk for falls.    Time 3    Period Weeks    Status New    Target Date 09/29/21               PT Long Term Goals - 09/08/21 1510       PT LONG TERM GOAL #1   Title Patient will be able to perform stand x 5 in < 15 seconds to demonstrate improvement in functional mobility and reduced risk for falls.    Time 6    Period Weeks    Status New    Target Date 10/20/21      PT LONG TERM GOAL #2   Title Patient will have equal to or > 4+/5 MMT throughout BLE to improve ability to perform functional mobility, stair ambulation and ADLs.    Time 6    Period Weeks    Status New    Target Date 10/20/21      PT LONG TERM GOAL #3   Title Patient will be able to ambulate at least 275 feet during 2MWT with LRAD to demonstrate improved ability to perform functional mobility and associated tasks.    Time 6    Period Weeks    Status New    Target Date 10/20/21      PT LONG TERM GOAL #4   Title Patient will be independent with advanced HEP and self-management strategies to improve functional outcomes    Time 6    Period Weeks    Status New    Target Date 10/20/21                    Plan - 09/08/21 1506     Clinical Impression Statement Patient is a 56 y.o. male who presents to physical therapy with complaint of LT side weakness s/p CVA. Patient demonstrates decreased strength, balance deficits and gait abnormalities which are negatively impacting  patient ability to perform ADLs and functional mobility tasks. Patient will benefit from skilled physical therapy services to address these deficits to improve level of function with ADLs, functional mobility tasks, and reduce risk for falls.    Personal Factors and Comorbidities Comorbidity 3+    Comorbidities DM, HTN, CVA     Examination-Activity Limitations Locomotion Level;Transfers;Stand;Stairs;Squat    Examination-Participation Restrictions Community Activity;Laundry;Yard Work;Meal Prep;Cleaning    Stability/Clinical Decision Making Evolving/Moderate complexity    Clinical Decision Making Moderate    Rehab Potential Good    PT Frequency 2x / week    PT Duration 6 weeks    PT Treatment/Interventions ADLs/Self Care Home Management;Aquatic Therapy;Biofeedback;DME Instruction;Gait training;Cryotherapy;Stair training;Electrical Stimulation;Functional mobility training;Iontophoresis 4mg /ml Dexamethasone;Therapeutic activities;Orthotic Fit/Training;Energy conservation;Splinting;Taping;Vasopneumatic Device;Manual techniques;Therapeutic exercise;Moist Heat;Traction;Balance training;Manual lymph drainage;Vestibular;Contrast Bath;Parrafin;Ultrasound;Neuromuscular re-education;Compression bandaging;Scar mobilization;Visual/perceptual remediation/compensation;Cognitive remediation;Patient/family education;Passive range of motion;Dry needling;Spinal Manipulations;Joint Manipulations    PT Next Visit Plan Progress LE strength as able. Continue to progress to gait and balance as tolerated    PT Home Exercise Plan Eval: seated march, heel/ toe raise, LAQ    Consulted and Agree with Plan of Care Patient             Patient will benefit from skilled therapeutic intervention in order to improve the following deficits and impairments:  Abnormal gait, Decreased endurance, Hypomobility, Decreased activity tolerance, Decreased balance, Decreased strength, Impaired UE functional use, Decreased mobility, Difficulty walking, Improper body mechanics, Impaired perceived functional ability  Visit Diagnosis: Other abnormalities of gait and mobility - Plan: PT plan of care cert/re-cert  Muscle weakness (generalized) - Plan: PT plan of care cert/re-cert     Problem List Patient Active Problem List   Diagnosis Date Noted   Controlled  type 2 diabetes mellitus with complication, without long-term current use of insulin ([place]) 08/25/2021   CKD (chronic kidney disease) stage 3, GFR 30-59 ml/min (HCC) 08/25/2021   Benign prostatic hyperplasia without lower urinary tract symptoms 08/25/2021   Insomnia 08/25/2021   Screening for colon cancer 08/25/2021   HOCM (hypertrophic obstructive cardiomyopathy) ([place]) 06/09/2021   CVA (cerebral vascular accident) ([place]) 05/20/2021   Dyslipidemia    Dysphagia, post-stroke    Acute CVA (cerebrovascular accident) ([place]) 05/17/2021   Stroke ([place]) 05/16/2021   DMII (diabetes mellitus, type 2) ([place]) 05/16/2021   HTN (hypertension) 05/16/2021   HLD (hyperlipidemia) 05/16/2021   3:37 PM, 09/08/21 Josue Hector PT DPT  Physical Therapist with [facility]  [phone] [place], [place], [address] Phone: [phone]   Fax:  [phone]  Name: Brandon French MRN: [id number] Date of Birth: [birth year]

## 2021-09-13 ENCOUNTER — Other Ambulatory Visit: Payer: Self-pay

## 2021-09-13 ENCOUNTER — Ambulatory Visit (HOSPITAL_COMMUNITY): Payer: Medicare HMO | Admitting: Speech Pathology

## 2021-09-13 ENCOUNTER — Encounter (HOSPITAL_COMMUNITY): Payer: Self-pay | Admitting: Speech Pathology

## 2021-09-13 DIAGNOSIS — R1314 Dysphagia, pharyngoesophageal phase: Secondary | ICD-10-CM | POA: Diagnosis not present

## 2021-09-13 DIAGNOSIS — I69398 Other sequelae of cerebral infarction: Secondary | ICD-10-CM | POA: Diagnosis not present

## 2021-09-13 DIAGNOSIS — R471 Dysarthria and anarthria: Secondary | ICD-10-CM

## 2021-09-13 DIAGNOSIS — R269 Unspecified abnormalities of gait and mobility: Secondary | ICD-10-CM | POA: Diagnosis not present

## 2021-09-13 DIAGNOSIS — R2689 Other abnormalities of gait and mobility: Secondary | ICD-10-CM | POA: Diagnosis not present

## 2021-09-13 DIAGNOSIS — M6281 Muscle weakness (generalized): Secondary | ICD-10-CM | POA: Diagnosis not present

## 2021-09-15 ENCOUNTER — Telehealth: Payer: Self-pay | Admitting: Nurse Practitioner

## 2021-09-15 LAB — COLOGUARD

## 2021-09-15 NOTE — Telephone Encounter (Signed)
Patient aware.

## 2021-09-15 NOTE — Progress Notes (Signed)
The specimen received by the laboratory was not acceptable for testing. The patient will be contacted to initiate a new sample collection

## 2021-09-15 NOTE — Telephone Encounter (Signed)
Pt returning call for cologard results

## 2021-09-15 NOTE — Therapy (Signed)
[place], [place], [address] Phone: [phone]   Fax:  [phone]  Speech Language Pathology Evaluation  Patient Details  Name: Brandon French MRN: [id number] Date of Birth: 1966/01/03 Referring Provider (SLP): Alysia Penna, MD   Encounter Date: 09/13/2021    Past Medical History:  Diagnosis Date   Chronic kidney disease    Diabetes mellitus ([place])    Elevated cholesterol    High blood pressure     Past Surgical History:  Procedure Laterality Date   TRANSURETHRAL RESECTION OF PROSTATE  2020    There were no vitals filed for this visit.      09/13/21 1348  Symptoms/Limitations  Subjective "It has gotten better."  Pain Assessment  Currently in Pain? No/denies     09/13/21 1503  SLP Visit Information  SLP Received On 09/13/21  Referring Provider (SLP) Alysia Penna, MD  Onset Date 05/16/2021  Medical Diagnosis s/p CVA  Subjective  Subjective "I have been drinking thin liquids."  Patient/Family Stated Goal Improve speech  Pain Assessment  Currently in Pain? No/denies  General Information  HPI 56 year old male with history of diabetes mellitus type 2, hypertension, hyperlipidemia, unspecified stroke with possible residual left-sided weakness presented to [facility] ED on 05/16/21 with weakness and slurred speech.  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  He failed swallow screen and NG tube was placed. MRI shows Acute nonhemorrhagic 12 mm infarct in the posterior limb of the left internal capsule or lateral thalamus. He attended inpatient rehab at [facility] from 10/21-11/8/22 and was discharged home with [facility]. His last MBSS was 07/14/21 with recommendation for NTL. He was referred for ongoing SLP therapy to address dysarthria and dysphagia by Dr. Alysia Penna.  Behavioral/Cognition alert and cooperative  Mobility Status with assistive device  Balance  Screen  Has the patient fallen in the past 6 months No  Has the patient had a decrease in activity level because of a fear of falling?  No  Is the patient reluctant to leave their home because of a fear of falling?  No  Prior Functional Status  Cognitive/Linguistic Baseline WFL  Type of Home House   Lives With Family  Available Support Family  Vocation On disability  Cognition  Overall Cognitive Status Within Functional Limits for tasks assessed  Memory Appears intact  Awareness Appears intact  Problem Solving Appears intact  Auditory Comprehension  Overall Auditory Comprehension Appears within functional limits for tasks assessed  Yes/No Questions WFL  Commands WFL  Conversation Complex  Visual Recognition/Discrimination  Discrimination WFL  Reading Comprehension  Reading Status WFL  Expression  Primary Mode of Expression Verbal  Verbal Expression  Overall Verbal Expression Appears within functional limits for tasks assessed  Initiation No impairment  Level of Generative/Spontaneous Verbalization Conversation  Repetition No impairment  Naming No impairment  Pragmatics No impairment  Interfering Components Speech intelligibility  Non-Verbal Means of Communication Not applicable  Written Expression  Dominant Hand Right  Written Expression Not tested  Oral Motor/Sensory Function  Overall Oral Motor/Sensory Function Impaired  Labial ROM WFL  Labial Symmetry WFL  Labial Strength WFL  Labial Sensation WFL  Labial Coordination Reduced  Lingual ROM WFL  Lingual Symmetry WFL  Lingual Strength Reduced  Lingual Coordination Reduced  Mandible WFL  Motor Speech  Overall Motor Speech Impaired  Respiration Impaired  Level of Impairment Phrase  Phonation  (glottal fry)  Resonance  (nasal emssion?)  Articulation Impaired  Level of Impairment Phrase  Intelligibility Intelligibility reduced  Word 75-100% accurate  Phrase 50-74% accurate  Sentence 50-74% accurate   Conversation 50-74% accurate  Motor Planning [facility]  Motor Speech Errors Aware;Unaware  Effective Techniques Slow rate;Pause;Over-articulate  Phonation Impaired  Vocal Abuses  (glottal fry)  Volume Appropriate  Pitch Appropriate      09/13/21 1419  SLP Visits / Re-Eval  Visit Number 1  Number of Visits 8  Date for SLP Re-Evaluation 10/13/21  Authorization  Authorization Type [employer]  SLP Time Calculation  SLP Start Time 1430  SLP Stop Time  1515  SLP Time Calculation (min) 45 min  SLP - End of Session  Activity Tolerance Patient tolerated treatment well      09/13/21 1522  SLP Assessment/Plan  Clinical Impression Statement Pt presents with moderate dysarthria characterized by nasal air emission impacting resonance and nasality, reduced breath support, glottal fry, imprecise articulation, and harsh vocal quality which negatively impacts speech intelligibility and naturalness of speech. Pt has made tremendous improvement since his stroke when he was nearly completely unintelligible. He had his last MBSS 07/14/21 with recommendation for D3/mech soft and NTL, however he reports that he has been consuming only thin liquids for the past couple of months and is satisfied with his swallow function. He denies shortness of breath, fever, or chest congestion and SLP advised him to monitor for this. SLP will continue to monitor and offer MBSS if Pt would like. Pt will benefit from skilled SLP in order to address the above impairments, maximize independence, and decrease burden of care.  Speech Therapy Frequency 2x / week  Duration 4 weeks  Treatment/Interventions SLP instruction and feedback;Compensatory strategies;Compensatory techniques;Patient/family education;Cueing hierarchy  Potential to Achieve Goals Good  SLP Home Exercise Plan Pt will completed HEP as assigned to facilitate carryover of treatment strategies and techniques in home environment with use of written cues as needed.   Consulted and Agree with Plan of Care Patient     09/13/21 1531  SLP SHORT TERM GOAL #1  Title Pt will implement speech intelligibility strategies during conversational speech (decrease rate, self correct errors, self monitoring, checking to see if listener understands) with min assist.  Baseline 4  Time 4  Period Weeks  Status New  Target Date 10/13/21  SLP SHORT TERM GOAL #2  Title Pt will demonstrate balanced coordination of respiration with phonation, as demonstrated by continuous periodicity (no glottal fry) with 85% or more accuracy at the sentence, paragraph level and in conversation.  Baseline 4  Time 4  Period Weeks  Status New  Target Date 10/13/21  SLP SHORT TERM GOAL #3  Title Improve breath support during speech (not speaking to end of breath)  Baseline 4  Time 4  Period Weeks  Status New  Target Date 10/13/21    Patient will benefit from skilled therapeutic intervention in order to improve the following deficits and impairments:   Dysarthria and anarthria    Problem List Patient Active Problem List   Diagnosis Date Noted   Controlled type 2 diabetes mellitus with complication, without long-term current use of insulin (HCC) 08/25/2021   CKD (chronic kidney disease) stage 3, GFR 30-59 ml/min (HCC) 08/25/2021   Benign prostatic hyperplasia without lower urinary tract symptoms 08/25/2021   Insomnia 08/25/2021   Screening for colon cancer 08/25/2021   HOCM (hypertrophic obstructive cardiomyopathy) (HCC) 06/09/2021   CVA (cerebral vascular accident) (HCC) 05/20/2021   Dyslipidemia    Dysphagia, post-stroke  Acute CVA (cerebrovascular accident) ([place]) 05/17/2021   Stroke ([place]) 05/16/2021   DMII (diabetes mellitus, type 2) ([place]) 05/16/2021   HTN (hypertension) 05/16/2021   HLD (hyperlipidemia) 05/16/2021   Thank you,  [name], [place]  [name], [place] 09/13/2021, 2:24 PM  [place] [address] [place], [place], [address] Phone: [phone]   Fax:  [phone]  Name: SHANN HODGKINSON MRN: [id number] Date of Birth: 07/18/1966

## 2021-09-16 ENCOUNTER — Ambulatory Visit: Payer: Medicare HMO | Attending: Neurology | Admitting: Neurology

## 2021-09-16 ENCOUNTER — Other Ambulatory Visit: Payer: Self-pay

## 2021-09-16 DIAGNOSIS — R0683 Snoring: Secondary | ICD-10-CM | POA: Diagnosis not present

## 2021-09-16 DIAGNOSIS — G4739 Other sleep apnea: Secondary | ICD-10-CM | POA: Insufficient documentation

## 2021-09-16 DIAGNOSIS — G478 Other sleep disorders: Secondary | ICD-10-CM | POA: Diagnosis not present

## 2021-09-16 DIAGNOSIS — G4733 Obstructive sleep apnea (adult) (pediatric): Secondary | ICD-10-CM | POA: Diagnosis not present

## 2021-09-21 ENCOUNTER — Ambulatory Visit (HOSPITAL_COMMUNITY): Payer: Medicare HMO | Admitting: Physical Therapy

## 2021-09-22 ENCOUNTER — Telehealth (HOSPITAL_COMMUNITY): Payer: Self-pay | Admitting: Physical Therapy

## 2021-09-22 ENCOUNTER — Ambulatory Visit (HOSPITAL_COMMUNITY): Payer: Medicare HMO | Admitting: Physical Therapy

## 2021-09-22 ENCOUNTER — Ambulatory Visit (HOSPITAL_COMMUNITY): Payer: Medicare HMO | Admitting: Speech Pathology

## 2021-09-22 NOTE — Telephone Encounter (Signed)
Pt did not show for speech or PT appointments this morning.  Called number given for pateint, however disconnected.  Called contact numbers given (pt's parents) and there was no answer or voicemail available.  Teena Irani, PTA/CLT, Lissa Morales [phone]

## 2021-09-27 ENCOUNTER — Encounter (HOSPITAL_COMMUNITY): Payer: Self-pay | Admitting: Physical Therapy

## 2021-09-27 ENCOUNTER — Ambulatory Visit (HOSPITAL_COMMUNITY): Payer: Medicare HMO | Admitting: Physical Therapy

## 2021-09-27 ENCOUNTER — Ambulatory Visit (HOSPITAL_COMMUNITY): Payer: Medicare HMO | Admitting: Speech Pathology

## 2021-09-27 ENCOUNTER — Other Ambulatory Visit: Payer: Self-pay

## 2021-09-27 ENCOUNTER — Encounter (HOSPITAL_COMMUNITY): Payer: Self-pay | Admitting: Speech Pathology

## 2021-09-27 ENCOUNTER — Ambulatory Visit: Payer: Medicare HMO | Admitting: Nurse Practitioner

## 2021-09-27 DIAGNOSIS — R269 Unspecified abnormalities of gait and mobility: Secondary | ICD-10-CM | POA: Diagnosis not present

## 2021-09-27 DIAGNOSIS — R471 Dysarthria and anarthria: Secondary | ICD-10-CM

## 2021-09-27 DIAGNOSIS — R1314 Dysphagia, pharyngoesophageal phase: Secondary | ICD-10-CM

## 2021-09-27 DIAGNOSIS — R2689 Other abnormalities of gait and mobility: Secondary | ICD-10-CM | POA: Diagnosis not present

## 2021-09-27 DIAGNOSIS — M6281 Muscle weakness (generalized): Secondary | ICD-10-CM

## 2021-09-27 DIAGNOSIS — I69398 Other sequelae of cerebral infarction: Secondary | ICD-10-CM | POA: Diagnosis not present

## 2021-09-27 NOTE — Therapy (Signed)
Brandon French, [place], [address] Phone: [phone]   Fax:  [phone]  Speech Language Pathology Treatment  Patient Details  Name: Brandon French MRN: [id number] Date of Birth: 1965/09/05 Referring Provider (SLP): Alysia Penna, MD   Encounter Date: 09/27/2021   End of Session - 09/27/21 1330     Visit Number 2    Number of Visits 8    Date for SLP Re-Evaluation 10/13/21    Authorization Type [employer]   8 visits approved through 10/13/21   SLP Start Time 1215    SLP Stop Time  1300    SLP Time Calculation (min) 45 min    Activity Tolerance Patient tolerated treatment well             Past Medical History:  Diagnosis Date   Chronic kidney disease    Diabetes mellitus ([place])    Elevated cholesterol    High blood pressure     Past Surgical History:  Procedure Laterality Date   TRANSURETHRAL RESECTION OF PROSTATE  2020    There were no vitals filed for this visit.   Subjective Assessment - 09/27/21 1232     Subjective "They can understand me ok."    Currently in Pain? No/denies                   ADULT SLP TREATMENT - 09/27/21 1246       General Information   Behavior/Cognition Alert;Cooperative;Pleasant mood    Patient Positioning Upright in chair    Oral care provided N/A    HPI 56 year old male with history of diabetes mellitus type 2, hypertension, hyperlipidemia, unspecified stroke with possible residual left-sided weakness presented to [facility] ED on 05/16/21 with weakness and slurred speech.  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  He failed swallow screen and NG tube was placed. MRI shows Acute nonhemorrhagic 12 mm infarct in the posterior limb of the left internal capsule or lateral thalamus. He attended inpatient rehab at [facility] from 10/21-11/8/22 and was discharged home with [facility]. His last MBSS was 07/14/21 with recommendation  for NTL. He was referred for ongoing SLP therapy to address dysarthria and dysphagia by Dr. Alysia Penna.      Treatment Provided   Treatment provided Cognitive-Linquistic      Pain Assessment   Pain Assessment No/denies pain      Cognitive-Linquistic Treatment   Treatment focused on Dysarthria;Voice;Patient/family/caregiver education    Skilled Treatment SLP provided moderate cues for breath support, swallows, and speech intelligibility strategies in structured and unstructured tasks.     Assessment / Recommendations / Plan   Plan Continue with current plan of care      Progression Toward Goals   Progression toward goals Progressing toward goals                SLP Short Term Goals - 09/27/21 1330       SLP SHORT TERM GOAL #1   Title Pt will implement speech intelligibility strategies during conversational speech (decrease rate, self correct errors, self monitoring, checking to see if listener understands) with min assist.    Baseline 4    Time 4    Period Weeks    Status On-going    Target Date 10/13/21      SLP SHORT TERM GOAL #2   Title Pt will demonstrate balanced coordination of respiration with phonation, as demonstrated by continuous periodicity (no  glottal fry) with 85% or more accuracy at the sentence, paragraph level and in conversation.    Baseline 4    Time 4    Period Weeks    Status On-going    Target Date 10/13/21      SLP SHORT TERM GOAL #3   Title Improve breath support during speech (not speaking to end of breath)    Baseline 4    Time 4    Period Weeks    Status On-going    Target Date 10/13/21                Plan - 09/27/21 1330     Clinical Impression Statement Moderate dysarthria persists. Pt alert and cooperative. He sustained /a/ for an average of 5.9 seconds with mod cues. He was cued to swallow one time every 30 seconds and needed mod/max reminders to do so. Pt continues to experience reduced control of saliva/labial  spillage and swallowing more frequently should help with this. He exhibits laryngeal tension and breath holding during speech tasks and will need continued work on abdominal breathing and vocal fold unloading tasks.    Speech Therapy Frequency 2x / week    Duration 4 weeks    Treatment/Interventions SLP instruction and feedback;Compensatory strategies;Compensatory techniques;Patient/family education;Cueing hierarchy    Potential to Achieve Goals Good    SLP Home Exercise Plan Pt will completed HEP as assigned to facilitate carryover of treatment strategies and techniques in home environment with use of written cues as needed.    Consulted and Agree with Plan of Care Patient             Patient will benefit from skilled therapeutic intervention in order to improve the following deficits and impairments:   Dysarthria and anarthria  Dysphagia, pharyngoesophageal phase    Problem List Patient Active Problem List   Diagnosis Date Noted   Controlled type 2 diabetes mellitus with complication, without long-term current use of insulin ([place]) 08/25/2021   CKD (chronic kidney disease) stage 3, GFR 30-59 ml/min ([place]) 08/25/2021   Benign prostatic hyperplasia without lower urinary tract symptoms 08/25/2021   Insomnia 08/25/2021   Screening for colon cancer 08/25/2021   HOCM (hypertrophic obstructive cardiomyopathy) ([place]) 06/09/2021   CVA (cerebral vascular accident) ([place]) 05/20/2021   Dyslipidemia    Dysphagia, post-stroke    Acute CVA (cerebrovascular accident) ([place]) 05/17/2021   Stroke ([place]) 05/16/2021   DMII (diabetes mellitus, type 2) ([place]) 05/16/2021   HTN (hypertension) 05/16/2021   HLD (hyperlipidemia) 05/16/2021   Thank you,  Brandon French, [place], [place] 09/27/2021, 1:31 PM  [place], [place], [address] Phone: [phone]   Fax:  [phone]   Name: Brandon French MRN:  [id number] Date of Birth: [birth year]

## 2021-09-27 NOTE — Therapy (Signed)
[place], [place], [address] Phone: [phone]   Fax:  [phone]  Physical Therapy Treatment  Patient Details  Name: Brandon French MRN: [id number] Date of Birth: Sep 29, 1965 Referring Provider (PT): Alysia Penna MD   Encounter Date: 09/27/2021   PT End of Session - 09/27/21 1139     Visit Number 2    Number of Visits 12    Date for PT Re-Evaluation 10/20/21    Authorization Type [employer] Medicare    Authorization Time Period 12 visits approved 2/9-3/23    Authorization - Visit Number 2    Authorization - Number of Visits 12    Progress Note Due on Visit 10    PT Start Time 1137    PT Stop Time 1215    PT Time Calculation (min) 38 min    Activity Tolerance Patient tolerated treatment well    Behavior During Therapy [facility] for tasks assessed/performed             Past Medical History:  Diagnosis Date   Chronic kidney disease    Diabetes mellitus ([place])    Elevated cholesterol    High blood pressure     Past Surgical History:  Procedure Laterality Date   TRANSURETHRAL RESECTION OF PROSTATE  2020    There were no vitals filed for this visit.   Subjective Assessment - 09/27/21 1140     Subjective Been doing his best. No falls    Pertinent History CVA 04/1721    Limitations Standing;Walking;Lifting;House hold activities    Patient Stated Goals Improve strength and gait    Currently in Pain? No/denies                               [facility] Adult PT Treatment/Exercise - 09/27/21 0001       Exercises   Exercises Knee/Hip      Knee/Hip Exercises: Aerobic   Nustep level 6 5 minute warm up seat 12      Knee/Hip Exercises: Standing   Hip Abduction Both;2 sets;10 reps      Knee/Hip Exercises: Seated   Long Arc Quad Both;10 reps;2 sets    [employer] Limitations 3-5 second holds    Knee/Hip Flexion march 2x 10 bilateral    Other Seated Knee/Hip Exercises TR 2x 20 ; L  hamstring iso 10 x 5 second holds 2 sets; hip abduction iso with belt 10x 5 second holds bilateral                     PT Education - 09/27/21 1139     Education Details HEP    Person(s) Educated Patient    Methods Explanation;Demonstration;Handout    Comprehension Verbalized understanding;Returned demonstration              PT Short Term Goals - 09/08/21 1510       PT SHORT TERM GOAL #1   Title Patient will be independent with initial HEP and self-management strategies to improve functional outcomes    Time 3    Period Weeks    Status New    Target Date 09/29/21      PT SHORT TERM GOAL #2   Title Patient will be able to perform stand x 5 in < 30 seconds to demonstrate improvement in functional mobility and reduced risk for falls.    Time 3    Period Weeks  Status New    Target Date 09/29/21               PT Long Term Goals - 09/08/21 1510       PT LONG TERM GOAL #1   Title Patient will be able to perform stand x 5 in < 15 seconds to demonstrate improvement in functional mobility and reduced risk for falls.    Time 6    Period Weeks    Status New    Target Date 10/20/21      PT LONG TERM GOAL #2   Title Patient will have equal to or > 4+/5 MMT throughout BLE to improve ability to perform functional mobility, stair ambulation and ADLs.    Time 6    Period Weeks    Status New    Target Date 10/20/21      PT LONG TERM GOAL #3   Title Patient will be able to ambulate at least 275 feet during 2MWT with LRAD to demonstrate improved ability to perform functional mobility and associated tasks.    Time 6    Period Weeks    Status New    Target Date 10/20/21      PT LONG TERM GOAL #4   Title Patient will be independent with advanced HEP and self-management strategies to improve functional outcomes    Time 6    Period Weeks    Status New    Target Date 10/20/21                   Plan - 09/27/21 1139     Clinical Impression  Statement Patient begins session with nu step for warm up and conditioning. Patient with L ankle clonus with nu step which improves with cueing for heel pressing pedal vs. forefoot. Patient very unsteady with ambulation following nu step with c/o dizziness.  Continues to demonstrate LE weakness with seated exercises and states decreased compliance with HEP. Requires intermittent rest breaks and educated on continuing HEP. Patient will continue to benefit from physical therapy to reduce impairment and improve function.    Personal Factors and Comorbidities Comorbidity 3+    Comorbidities DM, HTN, CVA    Examination-Activity Limitations Locomotion Level;Transfers;Stand;Stairs;Squat    Examination-Participation Restrictions Community Activity;Laundry;Yard Work;Meal Prep;Cleaning    Stability/Clinical Decision Making Evolving/Moderate complexity    Rehab Potential Good    PT Frequency 2x / week    PT Duration 6 weeks    PT Treatment/Interventions ADLs/Self Care Home Management;Aquatic Therapy;Biofeedback;DME Instruction;Gait training;Cryotherapy;Stair training;Electrical Stimulation;Functional mobility training;Iontophoresis 4mg /ml Dexamethasone;Therapeutic activities;Orthotic Fit/Training;Energy conservation;Splinting;Taping;Vasopneumatic Device;Manual techniques;Therapeutic exercise;Moist Heat;Traction;Balance training;Manual lymph drainage;Vestibular;Contrast Bath;Parrafin;Ultrasound;Neuromuscular re-education;Compression bandaging;Scar mobilization;Visual/perceptual remediation/compensation;Cognitive remediation;Patient/family education;Passive range of motion;Dry needling;Spinal Manipulations;Joint Manipulations    PT Next Visit Plan Progress LE strength as able. Continue to progress to gait and balance as tolerated    PT Home Exercise Plan Eval: seated march, heel/ toe raise, LAQ    Consulted and Agree with Plan of Care Patient             Patient will benefit from skilled therapeutic  intervention in order to improve the following deficits and impairments:  Abnormal gait, Decreased endurance, Hypomobility, Decreased activity tolerance, Decreased balance, Decreased strength, Impaired UE functional use, Decreased mobility, Difficulty walking, Improper body mechanics, Impaired perceived functional ability  Visit Diagnosis: Other abnormalities of gait and mobility  Muscle weakness (generalized)     Problem List Patient Active Problem List   Diagnosis Date Noted   Controlled type 2 diabetes mellitus with  complication, without long-term current use of insulin ([place]) 08/25/2021   CKD (chronic kidney disease) stage 3, GFR 30-59 ml/min (HCC) 08/25/2021   Benign prostatic hyperplasia without lower urinary tract symptoms 08/25/2021   Insomnia 08/25/2021   Screening for colon cancer 08/25/2021   HOCM (hypertrophic obstructive cardiomyopathy) ([place]) 06/09/2021   CVA (cerebral vascular accident) ([place]) 05/20/2021   Dyslipidemia    Dysphagia, post-stroke    Acute CVA (cerebrovascular accident) ([place]) 05/17/2021   Stroke ([place]) 05/16/2021   DMII (diabetes mellitus, type 2) ([place]) 05/16/2021   HTN (hypertension) 05/16/2021   HLD (hyperlipidemia) 05/16/2021    12:17 PM, 09/27/21 Mearl Latin PT, DPT Physical Therapist at [place], [place], [address] Phone: [phone]   Fax:  [phone]  Name: Brandon French MRN: [id number] Date of Birth: [birth year]

## 2021-09-28 NOTE — Procedures (Signed)
[facility] Shilynn Hoch A. Gerilyn Pilgrim, MD     [email or web address]             HOME SLEEP TEST  LOCATION: [facility]   Patient Name: Brandon French, Brandon French Date: 09/16/2021 Gender: Male D.O.B: [birth year] Age (years): 79 Referring Provider: Not Available Height (inches): 72 Interpreting Physician: Beryle Beams MD, ABSM Weight (lbs): 305 RPSGT: Alfonso Ellis BMI: 41 MRN: [id number] Neck Size: CLINICAL INFORMATION Sleep Study Type: HST     Indication for sleep study: N/A     Epworth Sleepiness Score: N/A  SLEEP STUDY TECHNIQUE A multi-channel overnight portable sleep study was performed. The channels recorded were: nasal airflow, thoracic respiratory movement, and oxygen saturation with a pulse oximetry. Snoring was also monitored.  MEDICATIONS Patient self administered medications include: N/A.  Current Outpatient Medications:    acetaminophen (TYLENOL) 325 MG tablet, Take 1-2 tablets (325-650 mg total) by mouth every 4 (four) hours as needed for mild pain., Disp: , Rfl:    amLODipine (NORVASC) 10 MG tablet, Take 1 tablet (10 mg total) by mouth daily., Disp: 30 tablet, Rfl: 3   aspirin 81 MG EC tablet, Take 1 tablet (81 mg total) by mouth daily with breakfast. Please take Aspirin 81 mg daily till 11/21 then stop., Disp: 13 tablet, Rfl: 0   atorvastatin (LIPITOR) 80 MG tablet, Take 1 tablet (80 mg total) by mouth daily., Disp: 90 tablet, Rfl: 0   cloNIDine (CATAPRES) 0.2 MG tablet, Take 1 tablet (0.2 mg total) by mouth 3 (three) times daily., Disp: 90 tablet, Rfl: 1   clopidogrel (PLAVIX) 75 MG tablet, Take 1 tablet (75 mg total) by mouth daily. Please take Aspirin 81 mg daily along with Plavix 75 mg daily for 30 days. STOP the aspirin after, Disp: 30 tablet, Rfl: 1   diclofenac Sodium (VOLTAREN) 1 % GEL, APPLY 2 G TOPICALLY 4 (FOUR) TIMES DAILY. TO RIGHT TOE/FOOT, Disp: 400 g, Rfl: 1   finasteride (PROSCAR) 5 MG tablet, Take 1 tablet (5 mg total) by mouth daily., Disp:  30 tablet, Rfl: 1   food thickener (SIMPLYTHICK, HONEY/LEVEL 3/MODERATELY THICK,) LIQD, Use one packet to thicken any liquids to honey consistency., Disp: 300 packet, Rfl: 1   gabapentin (NEURONTIN) 100 MG capsule, Take 1 capsule (100 mg total) by mouth at bedtime., Disp: 30 capsule, Rfl: 1   glimepiride (AMARYL) 1 MG tablet, Take 1 tablet (1 mg total) by mouth daily with breakfast., Disp: 30 tablet, Rfl: 3   hydrALAZINE (APRESOLINE) 25 MG tablet, Take 1 tablet (25 mg total) by mouth 3 (three) times daily., Disp: 90 tablet, Rfl: 1   pantoprazole (PROTONIX) 40 MG tablet, Take 1 tablet (40 mg total) by mouth daily., Disp: 30 tablet, Rfl: 2   senna-docusate (SENOKOT-S) 8.6-50 MG tablet, Take 2 tablets by mouth 2 (two) times daily., Disp: 120 tablet, Rfl: 0   tamsulosin (FLOMAX) 0.4 MG CAPS capsule, Take 1 capsule (0.4 mg total) by mouth daily., Disp: 90 capsule, Rfl: 1   traZODone (DESYREL) 50 MG tablet, Take 1 tablet (50 mg total) by mouth at bedtime., Disp: 30 tablet, Rfl: 1   SLEEP ARCHITECTURE Patient was studied for 367.4 minutes. The sleep efficiency was 76.6 % and the patient was supine for 48.9%. The arousal index was 0.0 per hour.  RESPIRATORY PARAMETERS The overall AHI was 49.5 per hour, with a central apnea index of 0 per hour.  The oxygen nadir was 70% during sleep.     CARDIAC DATA Mean heart rate during sleep was 53.6  bpm.  IMPRESSIONS Severe obstructive sleep apnea occurred during this study (AHI = 49.5/h). AUTO PAP 10-20 is recommended.  Argie Ramming, MD Diplomate, American Board of Sleep Medicine. ELECTRONICALLY SIGNED ON:  09/28/2021, 5:18 PM [facility] PH: [phone]   FX: [phone] ACCREDITED BY THE AMERICAN ACADEMY OF SLEEP MEDICINE

## 2021-09-29 ENCOUNTER — Encounter (HOSPITAL_COMMUNITY): Payer: Self-pay | Admitting: Speech Pathology

## 2021-09-29 ENCOUNTER — Ambulatory Visit (HOSPITAL_COMMUNITY): Payer: Medicare HMO | Attending: Physical Medicine & Rehabilitation | Admitting: Physical Therapy

## 2021-09-29 ENCOUNTER — Ambulatory Visit (HOSPITAL_COMMUNITY): Payer: Medicare HMO | Admitting: Speech Pathology

## 2021-09-29 ENCOUNTER — Other Ambulatory Visit: Payer: Self-pay

## 2021-09-29 DIAGNOSIS — R2689 Other abnormalities of gait and mobility: Secondary | ICD-10-CM | POA: Diagnosis not present

## 2021-09-29 DIAGNOSIS — M6281 Muscle weakness (generalized): Secondary | ICD-10-CM | POA: Diagnosis not present

## 2021-09-29 DIAGNOSIS — R471 Dysarthria and anarthria: Secondary | ICD-10-CM | POA: Diagnosis not present

## 2021-09-29 NOTE — Therapy (Signed)
[place] [facility] ?[address] ?[place], [place], [address] ?Phone: [phone]   Fax:  [phone] ? ?Physical Therapy Treatment ? ?Patient Details  ?Name: Brandon French ?MRN: [id number] ?Date of Birth: [birth year] ?Referring Provider (PT): Alysia Penna MD ? ? ?Encounter Date: 09/29/2021 ? ? PT End of Session - 09/29/21 1149   ? ? Visit Number 3   ? Number of Visits 12   ? Date for PT Re-Evaluation 10/20/21   ? Authorization Type [employer]   ? Authorization Time Period 12 visits approved 2/9-3/23   ? Authorization - Visit Number 3   ? Authorization - Number of Visits 12   ? Progress Note Due on Visit 10   ? PT Start Time 1135   ? PT Stop Time [id number]   ? PT Time Calculation (min) 40 min   ? Activity Tolerance Patient tolerated treatment well   ? Behavior During Therapy [facility] for tasks assessed/performed   ? ?  ?  ? ?  ? ? ?Past Medical History:  ?Diagnosis Date  ? Chronic kidney disease   ? Diabetes mellitus ([place])   ? Elevated cholesterol   ? High blood pressure   ? ? ?Past Surgical History:  ?Procedure Laterality Date  ? TRANSURETHRAL RESECTION OF PROSTATE  2020  ? ? ?There were no vitals filed for this visit. ? ? Subjective Assessment - 09/29/21 1137   ? ? Subjective Pt had ST prior to PT today.  comes today without any pain or issues.   ? ?  ?  ? ?  ? ? ? ? ? ? ? ? ? ? ? ? ? ? ? ? ? ? ? ? [facility] Adult PT Treatment/Exercise - 09/29/21 0001   ? ?  ? Knee/Hip Exercises: Standing  ? Hip Flexion Both;2 sets;10 reps   ? Hip Flexion Limitations high march holds with 1 UE assist   ? Side Lunges Both;2 sets;10 reps   ? Side Lunges Limitations onto 4" step without UE assist   ? Hip Abduction Both;2 sets;10 reps   ? Hip Extension Both;2 sets;10 reps   ? Lateral Step Up Both;2 sets;Hand Hold: 1;Step Height: 4"   ? Forward Step Up Both;2 sets;Hand Hold: 1;Hand Hold: 2;Step Height: 4"   ? SLS with Vectors bil 5x3" with 1 UE assist   ?  ? Knee/Hip Exercises: Seated  ? Sit to Sand 10  reps;with UE support   pt unable to complete without at least 1 UE support  ? ?  ?  ? ?  ? ? ? ? ? ? ? ? ? ? ? ? PT Short Term Goals - 09/08/21 1510   ? ?  ? PT SHORT TERM GOAL #1  ? Title Patient will be independent with initial HEP and self-management strategies to improve functional outcomes   ? Time 3   ? Period Weeks   ? Status New   ? Target Date 09/29/21   ?  ? PT SHORT TERM GOAL #2  ? Title Patient will be able to perform stand x 5 in < 30 seconds to demonstrate improvement in functional mobility and reduced risk for falls.   ? Time 3   ? Period Weeks   ? Status New   ? Target Date 09/29/21   ? ?  ?  ? ?  ? ? ? ? PT Long Term Goals - 09/08/21 1510   ? ?  ? PT LONG TERM GOAL #1  ?  Title Patient will be able to perform stand x 5 in < 15 seconds to demonstrate improvement in functional mobility and reduced risk for falls.   ? Time 6   ? Period Weeks   ? Status New   ? Target Date 10/20/21   ?  ? PT LONG TERM GOAL #2  ? Title Patient will have equal to or > 4+/5 MMT throughout BLE to improve ability to perform functional mobility, stair ambulation and ADLs.   ? Time 6   ? Period Weeks   ? Status New   ? Target Date 10/20/21   ?  ? PT LONG TERM GOAL #3  ? Title Patient will be able to ambulate at least 275 feet during 2MWT with LRAD to demonstrate improved ability to perform functional mobility and associated tasks.   ? Time 6   ? Period Weeks   ? Status New   ? Target Date 10/20/21   ?  ? PT LONG TERM GOAL #4  ? Title Patient will be independent with advanced HEP and self-management strategies to improve functional outcomes   ? Time 6   ? Period Weeks   ? Status New   ? Target Date 10/20/21   ? ?  ?  ? ?  ? ? ? ? ? ? ? ? Plan - 09/29/21 1215   ? ? Clinical Impression Statement Pt with visible wetness on pants with suspected urination; ST spoke to pt who reported he did not have issues with urgency or control.  Continued with LE strengthening exercises.  Pt with constant cues to maintain upright posturing and use  mm not momentum to complete activities.  Progressed standing strengthening and stabilization exercises today with intermittent HHA needed.  Pt voiced ?burning? in quads with lateral step up activity.  Pt unable to come to standing from standard chair without assist of 1 UE.  Pt will continue to benefit from skilled PT to increase strength and overall functional mobility.   ? Personal Factors and Comorbidities Comorbidity 3+   ? Comorbidities DM, HTN, CVA   ? Examination-Activity Limitations Locomotion Level;Transfers;Stand;Stairs;Squat   ? Examination-Participation Restrictions Community Activity;Laundry;Yard Work;Meal Prep;Cleaning   ? Stability/Clinical Decision Making Evolving/Moderate complexity   ? Rehab Potential Good   ? PT Frequency 2x / week   ? PT Duration 6 weeks   ? PT Treatment/Interventions ADLs/Self Care Home Management;Aquatic Therapy;Biofeedback;DME Instruction;Gait training;Cryotherapy;Stair training;Electrical Stimulation;Functional mobility training;Iontophoresis 4mg /ml Dexamethasone;Therapeutic activities;Orthotic Fit/Training;Energy conservation;Splinting;Taping;Vasopneumatic Device;Manual techniques;Therapeutic exercise;Moist Heat;Traction;Balance training;Manual lymph drainage;Vestibular;Contrast Bath;Parrafin;Ultrasound;Neuromuscular re-education;Compression bandaging;Scar mobilization;Visual/perceptual remediation/compensation;Cognitive remediation;Patient/family education;Passive range of motion;Dry needling;Spinal Manipulations;Joint Manipulations   ? PT Next Visit Plan Progress LE strength as able. Continue to progress to gait and balance as tolerated   ? PT Home Exercise Plan Eval: seated march, heel/ toe raise, LAQ   ? Consulted and Agree with Plan of Care Patient   ? ?  ?  ? ?  ? ? ?Patient will benefit from skilled therapeutic intervention in order to improve the following deficits and impairments:  Abnormal gait, Decreased endurance, Hypomobility, Decreased activity tolerance,  Decreased balance, Decreased strength, Impaired UE functional use, Decreased mobility, Difficulty walking, Improper body mechanics, Impaired perceived functional ability ? ?Visit Diagnosis: ?Muscle weakness (generalized) ? ?Other abnormalities of gait and mobility ? ? ? ? ?Problem List ?Patient Active Problem List  ? Diagnosis Date Noted  ? Controlled type 2 diabetes mellitus with complication, without long-term current use of insulin ([place]) 08/25/2021  ? CKD (chronic kidney disease) stage 3, GFR 30-59  ml/min ([place]) 08/25/2021  ? Benign prostatic hyperplasia without lower urinary tract symptoms 08/25/2021  ? Insomnia 08/25/2021  ? Screening for colon cancer 08/25/2021  ? HOCM (hypertrophic obstructive cardiomyopathy) ([place]) 06/09/2021  ? CVA (cerebral vascular accident) ([place]) 05/20/2021  ? Dyslipidemia   ? Dysphagia, post-stroke   ? Acute CVA (cerebrovascular accident) ([place]) 05/17/2021  ? Stroke ([place]) 05/16/2021  ? DMII (diabetes mellitus, type 2) ([place]) 05/16/2021  ? HTN (hypertension) 05/16/2021  ? HLD (hyperlipidemia) 05/16/2021  ? ?Marylee Belzer Sula Soda, PTA/CLT, WTA ?[phone] ? ?Roseanne Reno B, PTA ?09/29/2021, 12:16 PM ? ?[place] [facility] ?[address] ?[place], [place], [address] ?Phone: [phone]   Fax:  [phone] ? ?Name: Brandon French ?MRN: [id number] ?Date of Birth: [birth year] ? ? ? ?

## 2021-09-29 NOTE — Therapy (Signed)
[facility] [facility] ?[address] ?[place], [place], [address] ?Phone: [phone]   Fax:  [phone] ? ?Speech Language Pathology Treatment ? ?Patient Details  ?Name: Brandon French ?MRN: [id number] ?Date of Birth: 1965/11/17 ?Referring Provider (SLP): Alysia Penna, MD ? ? ?Encounter Date: 09/29/2021 ? ? End of Session - 09/29/21 1248   ? ? Visit Number 3   ? Number of Visits 8   ? Date for SLP Re-Evaluation 10/13/21   ? Authorization Type [employer]   8 visits approved through 10/13/21  ? SLP Start Time 1034   ? SLP Stop Time  1120   ? SLP Time Calculation (min) 46 min   ? Activity Tolerance Patient tolerated treatment well   ? ?  ?  ? ?  ? ? ?Past Medical History:  ?Diagnosis Date  ? Chronic kidney disease   ? Diabetes mellitus ([place])   ? Elevated cholesterol   ? High blood pressure   ? ? ?Past Surgical History:  ?Procedure Laterality Date  ? TRANSURETHRAL RESECTION OF PROSTATE  2020  ? ? ?There were no vitals filed for this visit. ? ? Subjective Assessment - 09/29/21 1047   ? ? Subjective "I am trying to swallow more often."   ? Currently in Pain? No/denies   ? ?  ?  ? ?  ? ? ? ? ADULT SLP TREATMENT - 09/29/21 1110   ? ?  ? General Information  ? Behavior/Cognition Alert;Cooperative;Pleasant mood   ? Patient Positioning Upright in chair   ? Oral care provided N/A   ? HPI 56 year old male with history of diabetes mellitus type 2, hypertension, hyperlipidemia, unspecified stroke with possible residual left-sided weakness presented to [facility] ED on 05/16/21 with weakness and slurred speech.  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  He failed swallow screen and NG tube was placed. MRI shows Acute nonhemorrhagic 12 mm infarct in the posterior limb of the left internal capsule or lateral thalamus. He attended inpatient rehab at [facility] from 10/21-11/8/22 and was discharged home with [facility]. His last MBSS was 07/14/21 with recommendation  for NTL. He was referred for ongoing SLP therapy to address dysarthria and dysphagia by Dr. Alysia Penna.   ?  ? Treatment Provided  ? Treatment provided Cognitive-Linquistic   ?  ? Pain Assessment  ? Pain Assessment No/denies pain   ?  ? Cognitive-Linquistic Treatment  ? Treatment focused on Dysarthria;Voice;Patient/family/caregiver education   ? Skilled Treatment SLP guided Pt through abdominal breathing, sustained phonation tasks, cues to increase swallow frequency, vocal fold unloading tasks, and implementation of speech intelligibilty strategies in structured and unstructured tasks.   ?  ? Assessment / Recommendations / Plan  ? Plan Continue with current plan of care   ?  ? Progression Toward Goals  ? Progression toward goals Progressing toward goals   ? ?  ?  ? ?  ? ? ? SLP Education - 09/29/21 1247   ? ? Education Details Pt given written handout on voice exercises at home   ? Person(s) Educated Patient   ? Methods Explanation;Handout   ? Comprehension Verbalized understanding   ? ?  ?  ? ?  ? ? ? SLP Short Term Goals - 09/29/21 1256   ? ?  ? SLP SHORT TERM GOAL #1  ? Title Pt will implement speech intelligibility strategies during conversational speech (decrease rate, self correct errors, self monitoring, checking to see if  listener understands) with min assist.   ? Baseline 4   ? Time 4   ? Period Weeks   ? Status On-going   ? Target Date 10/13/21   ?  ? SLP SHORT TERM GOAL #2  ? Title Pt will demonstrate balanced coordination of respiration with phonation, as demonstrated by continuous periodicity (no glottal fry) with 85% or more accuracy at the sentence, paragraph level and in conversation.   ? Baseline 4   ? Time 4   ? Period Weeks   ? Status On-going   ? Target Date 10/13/21   ?  ? SLP SHORT TERM GOAL #3  ? Title Improve breath support during speech (not speaking to end of breath)   ? Baseline 4   ? Time 4   ? Period Weeks   ? Status On-going   ? Target Date 10/13/21   ? ?  ?  ? ?  ? ? ? ? ? Plan -  09/29/21 1250   ? ? Clinical Impression Statement Moderate dysarthria with harsh vocal quality. Pt continues to need mod/max cues to replenish breath and reduce laryngeal tension during speech tasks. He was recorded for self feedback and stated that he can tell when he sounds "strangled". He needs step by step cues for "inhale, speak, exhale, replenish". He was able to sustain /a/ for an average of 9 seconds today which is an improvement from previous session. SLP also guided Pt through vocal function exercises and will continue this next session. He reports that he is trying to swallow more often, but needed frequent reminders in session to do so. Continue to focus on breath support in structured and unstructured speech tasks next session.   ? Speech Therapy Frequency 2x / week   ? Duration 4 weeks   ? Treatment/Interventions SLP instruction and feedback;Compensatory strategies;Compensatory techniques;Patient/family education;Cueing hierarchy   ? Potential to Achieve Goals Good   ? SLP Home Exercise Plan Pt will completed HEP as assigned to facilitate carryover of treatment strategies and techniques in home environment with use of written cues as needed.   ? Consulted and Agree with Plan of Care Patient   ? ?  ?  ? ?  ? ? ?Patient will benefit from skilled therapeutic intervention in order to improve the following deficits and impairments:   ?Dysarthria and anarthria ? ? ? ?Problem List ?Patient Active Problem List  ? Diagnosis Date Noted  ? Controlled type 2 diabetes mellitus with complication, without long-term current use of insulin ([place]) 08/25/2021  ? CKD (chronic kidney disease) stage 3, GFR 30-59 ml/min ([facility]) 08/25/2021  ? Benign prostatic hyperplasia without lower urinary tract symptoms 08/25/2021  ? Insomnia 08/25/2021  ? Screening for colon cancer 08/25/2021  ? HOCM (hypertrophic obstructive cardiomyopathy) ([place]) 06/09/2021  ? CVA (cerebral vascular accident) ([place]) 05/20/2021  ? Dyslipidemia   ? Dysphagia,  post-stroke   ? Acute CVA (cerebrovascular accident) ([place]) 05/17/2021  ? Stroke ([place]) 05/16/2021  ? DMII (diabetes mellitus, type 2) ([place]) 05/16/2021  ? HTN (hypertension) 05/16/2021  ? HLD (hyperlipidemia) 05/16/2021  ? ?Thank you, ? ?[name], [place] ?[phone] ? ?Kiari Hosmer, CCC-SLP ?09/29/2021, 12:56 PM ? ?[place] [facility] ?[address] ?[place], [place], [address] ?Phone: [phone]   Fax:  [phone] ? ? ?Name: Brandon French ?MRN: [id number] ?Date of Birth: [birth year] ? ?

## 2021-09-30 ENCOUNTER — Telehealth: Payer: Self-pay

## 2021-09-30 ENCOUNTER — Other Ambulatory Visit: Payer: Self-pay

## 2021-09-30 DIAGNOSIS — I10 Essential (primary) hypertension: Secondary | ICD-10-CM

## 2021-09-30 NOTE — Telephone Encounter (Signed)
Called pt advised that he should have a refill at pharmacy. Pt will contact pharmacy ?

## 2021-09-30 NOTE — Telephone Encounter (Signed)
Patient called need med refill ? ?hydrALAZINE (APRESOLINE) 25 MG tablet ? ? ?Pharmacy: [facility]  ?

## 2021-10-03 ENCOUNTER — Encounter (HOSPITAL_COMMUNITY): Payer: Medicare HMO | Admitting: Speech Pathology

## 2021-10-03 NOTE — Telephone Encounter (Signed)
Patient called said the pharmacy has not yet received this medicine. Can the provider resent to pharmacy to [place] ? ?hydrALAZINE (APRESOLINE) 25 MG tablet ?

## 2021-10-04 ENCOUNTER — Encounter (HOSPITAL_COMMUNITY): Payer: Self-pay | Admitting: Physical Therapy

## 2021-10-04 ENCOUNTER — Ambulatory Visit (HOSPITAL_COMMUNITY): Payer: Medicare HMO | Admitting: Physical Therapy

## 2021-10-04 ENCOUNTER — Other Ambulatory Visit: Payer: Self-pay

## 2021-10-04 DIAGNOSIS — M6281 Muscle weakness (generalized): Secondary | ICD-10-CM

## 2021-10-04 DIAGNOSIS — R2689 Other abnormalities of gait and mobility: Secondary | ICD-10-CM

## 2021-10-04 DIAGNOSIS — R471 Dysarthria and anarthria: Secondary | ICD-10-CM | POA: Diagnosis not present

## 2021-10-04 NOTE — Therapy (Signed)
[facility] ?[address] ?[place], [place], [address] ?Phone: [phone]   Fax:  [phone] ? ?Physical Therapy Treatment ? ?Patient Details  ?Name: Brandon French ?MRN: [id number] ?Date of Birth: 1966/07/14 ?Referring Provider (PT): Alysia Penna MD ? ? ?Encounter Date: 10/04/2021 ? ? PT End of Session - 10/04/21 1427   ? ? Visit Number 4   ? Number of Visits 12   ? Date for PT Re-Evaluation 10/20/21   ? Authorization Type [employer]   ? Authorization Time Period 12 visits approved 2/9-3/23   ? Authorization - Visit Number 4   ? Authorization - Number of Visits 12   ? Progress Note Due on Visit 10   ? PT Start Time 1430   ? PT Stop Time [id number]   ? PT Time Calculation (min) 39 min   ? Activity Tolerance Patient tolerated treatment well   ? Behavior During Therapy [facility] for tasks assessed/performed   ? ?  ?  ? ?  ? ? ?Past Medical History:  ?Diagnosis Date  ? Chronic kidney disease   ? Diabetes mellitus ([place])   ? Elevated cholesterol   ? High blood pressure   ? ? ?Past Surgical History:  ?Procedure Laterality Date  ? TRANSURETHRAL RESECTION OF PROSTATE  2020  ? ? ?There were no vitals filed for this visit. ? ? Subjective Assessment - 10/04/21 1434   ? ? Subjective No new issues. Doing HEP.   ? Currently in Pain? No/denies   ? ?  ?  ? ?  ? ? ? ? ? ? ? ? ? ? ? ? ? ? ? ? ? ? ? ? [facility] Adult PT Treatment/Exercise - 10/04/21 0001   ? ?  ? Knee/Hip Exercises: Aerobic  ? Nustep 5 minute lv 3 warm up seat 11   ?  ? Knee/Hip Exercises: Standing  ? Forward Step Up Both;2 sets;Hand Hold: 1;Step Height: 6"   ? Other Standing Knee Exercises sidestepping 5RT in // bars, step taps to 6 inch box 2 x 20   ?  ? Knee/Hip Exercises: Seated  ? Long Arc Quad Both;10 reps;2 sets   ? Marching 20 reps   ? Sit to Sand 2 sets;10 reps;with UE support   ? ?  ?  ? ?  ? ? ? ? ? ? ? ? ? ? ? ? PT Short Term Goals - 09/08/21 1510   ? ?  ? PT SHORT TERM GOAL #1  ? Title Patient will be independent with  initial HEP and self-management strategies to improve functional outcomes   ? Time 3   ? Period Weeks   ? Status New   ? Target Date 09/29/21   ?  ? PT SHORT TERM GOAL #2  ? Title Patient will be able to perform stand x 5 in < 30 seconds to demonstrate improvement in functional mobility and reduced risk for falls.   ? Time 3   ? Period Weeks   ? Status New   ? Target Date 09/29/21   ? ?  ?  ? ?  ? ? ? ? PT Long Term Goals - 09/08/21 1510   ? ?  ? PT LONG TERM GOAL #1  ? Title Patient will be able to perform stand x 5 in < 15 seconds to demonstrate improvement in functional mobility and reduced risk for falls.   ? Time 6   ? Period Weeks   ? Status  New   ? Target Date 10/20/21   ?  ? PT LONG TERM GOAL #2  ? Title Patient will have equal to or > 4+/5 MMT throughout BLE to improve ability to perform functional mobility, stair ambulation and ADLs.   ? Time 6   ? Period Weeks   ? Status New   ? Target Date 10/20/21   ?  ? PT LONG TERM GOAL #3  ? Title Patient will be able to ambulate at least 275 feet during 2MWT with LRAD to demonstrate improved ability to perform functional mobility and associated tasks.   ? Time 6   ? Period Weeks   ? Status New   ? Target Date 10/20/21   ?  ? PT LONG TERM GOAL #4  ? Title Patient will be independent with advanced HEP and self-management strategies to improve functional outcomes   ? Time 6   ? Period Weeks   ? Status New   ? Target Date 10/20/21   ? ?  ?  ? ?  ? ? ? ? ? ? ? ? Plan - 10/04/21 1520   ? ? Clinical Impression Statement Patient tolerated session well overall toady. Challenged with Nustep due to LLE spasticity. Patient demos observable fatigue but continues with activity unless encouraged to rest. Patient continues to be somewhat unsteady during gait with cane, but no LOB. Progressed LE strengthening with increased step height to 6 inch box. Added 6 inch step taps for balance.  Patient will continue to benefit from skilled therapy services to reduce deficits and improve  functional level.   ? Personal Factors and Comorbidities Comorbidity 3+   ? Comorbidities DM, HTN, CVA   ? Examination-Activity Limitations Locomotion Level;Transfers;Stand;Stairs;Squat   ? Examination-Participation Restrictions Community Activity;Laundry;Yard Work;Meal Prep;Cleaning   ? Stability/Clinical Decision Making Evolving/Moderate complexity   ? Rehab Potential Good   ? PT Frequency 2x / week   ? PT Duration 6 weeks   ? PT Treatment/Interventions ADLs/Self Care Home Management;Aquatic Therapy;Biofeedback;DME Instruction;Gait training;Cryotherapy;Stair training;Electrical Stimulation;Functional mobility training;Iontophoresis 4mg /ml Dexamethasone;Therapeutic activities;Orthotic Fit/Training;Energy conservation;Splinting;Taping;Vasopneumatic Device;Manual techniques;Therapeutic exercise;Moist Heat;Traction;Balance training;Manual lymph drainage;Vestibular;Contrast Bath;Parrafin;Ultrasound;Neuromuscular re-education;Compression bandaging;Scar mobilization;Visual/perceptual remediation/compensation;Cognitive remediation;Patient/family education;Passive range of motion;Dry needling;Spinal Manipulations;Joint Manipulations   ? PT Next Visit Plan Progress LE strength as able. Continue to progress to gait and balance as tolerated   ? PT Home Exercise Plan Eval: seated march, heel/ toe raise, LAQ   ? Consulted and Agree with Plan of Care Patient   ? ?  ?  ? ?  ? ? ?Patient will benefit from skilled therapeutic intervention in order to improve the following deficits and impairments:  Abnormal gait, Decreased endurance, Hypomobility, Decreased activity tolerance, Decreased balance, Decreased strength, Impaired UE functional use, Decreased mobility, Difficulty walking, Improper body mechanics, Impaired perceived functional ability ? ?Visit Diagnosis: ?Muscle weakness (generalized) ? ?Other abnormalities of gait and mobility ? ? ? ? ?Problem List ?Patient Active Problem List  ? Diagnosis Date Noted  ? Controlled type 2  diabetes mellitus with complication, without long-term current use of insulin ([place]) 08/25/2021  ? CKD (chronic kidney disease) stage 3, GFR 30-59 ml/min (HCC) 08/25/2021  ? Benign prostatic hyperplasia without lower urinary tract symptoms 08/25/2021  ? Insomnia 08/25/2021  ? Screening for colon cancer 08/25/2021  ? HOCM (hypertrophic obstructive cardiomyopathy) ([place]) 06/09/2021  ? CVA (cerebral vascular accident) ([place]) 05/20/2021  ? Dyslipidemia   ? Dysphagia, post-stroke   ? Acute CVA (cerebrovascular accident) ([place]) 05/17/2021  ? Stroke ([place]) 05/16/2021  ? DMII (diabetes  mellitus, type 2) ([place]) 05/16/2021  ? HTN (hypertension) 05/16/2021  ? HLD (hyperlipidemia) 05/16/2021  ? ?3:21 PM, 10/04/21 ?Josue Hector PT DPT  ?Physical Therapist with [facility]  ?[facility]  ?[phone] ? ?[facility] ?[address] ?[place], [place], [address] ?Phone: [phone]   Fax:  [phone] ? ?Name: Brandon French ?MRN: [id number] ?Date of Birth: 09/13/1965 ? ? ? ?

## 2021-10-04 NOTE — Telephone Encounter (Signed)
Tried contacting pharmacy unable to be connected ?

## 2021-10-05 ENCOUNTER — Encounter (HOSPITAL_COMMUNITY): Payer: Self-pay | Admitting: Speech Pathology

## 2021-10-05 ENCOUNTER — Ambulatory Visit (HOSPITAL_COMMUNITY): Payer: Medicare HMO | Admitting: Speech Pathology

## 2021-10-05 DIAGNOSIS — R471 Dysarthria and anarthria: Secondary | ICD-10-CM

## 2021-10-05 DIAGNOSIS — R2689 Other abnormalities of gait and mobility: Secondary | ICD-10-CM | POA: Diagnosis not present

## 2021-10-05 DIAGNOSIS — M6281 Muscle weakness (generalized): Secondary | ICD-10-CM | POA: Diagnosis not present

## 2021-10-05 NOTE — Therapy (Signed)
[facility] ?[address] ?[place], [place], [address] ?Phone: [phone]   Fax:  [phone] ? ?Speech Language Pathology Treatment ? ?Patient Details  ?Name: Brandon French ?MRN: [id number] ?Date of Birth: 1965/08/20 ?Referring Provider (SLP): Brandon Penna, MD ? ? ?Encounter Date: 10/05/2021 ? ? End of Session - 10/05/21 0912   ? ? Visit Number 4   ? Number of Visits 8   ? Date for SLP Re-Evaluation 10/13/21   ? Authorization Type [employer] Medicare   8 visits approved through 10/13/21  ? SLP Start Time 0901   ? SLP Stop Time  0945   ? SLP Time Calculation (min) 44 min   ? Activity Tolerance Patient tolerated treatment well   ? ?  ?  ? ?  ? ? ?Past Medical History:  ?Diagnosis Date  ? Chronic kidney disease   ? Diabetes mellitus ([place])   ? Elevated cholesterol   ? High blood pressure   ? ? ?Past Surgical History:  ?Procedure Laterality Date  ? TRANSURETHRAL RESECTION OF PROSTATE  2020  ? ? ?There were no vitals filed for this visit. ? ? Subjective Assessment - 10/05/21 0908   ? ? Subjective "Swallow two times a minute."   ? Currently in Pain? No/denies   ? ?  ?  ? ?  ? ? ? ? ADULT SLP TREATMENT - 10/05/21 0910   ? ?  ? General Information  ? Behavior/Cognition Alert;Cooperative;Pleasant mood   ? Patient Positioning Upright in chair   ? Oral care provided N/A   ? HPI 56 year old male with history of diabetes mellitus type 2, hypertension, hyperlipidemia, unspecified stroke with possible residual left-sided weakness presented to [facility] ED on 05/16/21 with weakness and slurred speech.  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  He failed swallow screen and NG tube was placed. MRI shows Acute nonhemorrhagic 12 mm infarct in the posterior limb of the left internal capsule or lateral thalamus. He attended inpatient rehab at [facility] from 10/21-11/8/22 and was discharged home with [facility]. His last MBSS was 07/14/21 with recommendation for NTL.  He was referred for ongoing SLP therapy to address dysarthria and dysphagia by Dr. Alysia French.   ?  ? Treatment Provided  ? Treatment provided Cognitive-Linquistic   ?  ? Pain Assessment  ? Pain Assessment No/denies pain   ?  ? Cognitive-Linquistic Treatment  ? Treatment focused on Dysarthria;Voice;Patient/family/caregiver education   ? Skilled Treatment SLP guided Pt through abdominal breathing, sustained phonation tasks, cues to increase swallow frequency, vocal fold unloading tasks, and implementation of speech intelligibilty strategies in structured and unstructured tasks.   ?  ? Assessment / Recommendations / Plan  ? Plan Continue with current plan of care   ?  ? Progression Toward Goals  ? Progression toward goals Progressing toward goals   ? ?  ?  ? ?  ? ? ? ? ? SLP Short Term Goals - 10/05/21 0913   ? ?  ? SLP SHORT TERM GOAL #1  ? Title Pt will implement speech intelligibility strategies during conversational speech (decrease rate, self correct errors, self monitoring, checking to see if listener understands) with min assist.   ? Baseline 4   ? Time 4   ? Period Weeks   ? Status On-going   ? Target Date 10/13/21   ?  ? SLP SHORT TERM GOAL #2  ? Title Pt will demonstrate balanced coordination of  respiration with phonation, as demonstrated by continuous periodicity (no glottal fry) with 85% or more accuracy at the sentence, paragraph level and in conversation.   ? Baseline 4   ? Time 4   ? Period Weeks   ? Status On-going   ? Target Date 10/13/21   ?  ? SLP SHORT TERM GOAL #3  ? Title Improve breath support during speech (not speaking to end of breath)   ? Baseline 4   ? Time 4   ? Period Weeks   ? Status On-going   ? Target Date 10/13/21   ? ?  ?  ? ?  ? ? ? ? ? Plan - 10/05/21 0913   ? ? Clinical Impression Statement Moderate dysarthria with harsh vocal quality. Pt required mod cues to replenish breath and reduce laryngeal tension during speech tasks. He was recorded for self feedback and stated that  he can tell when he sounds "strangled". He was able to sustain /a/ for an average of 10 seconds today which is an improvement from previous session. His reminders include: swallow, breathe in, relax as you speak while letting air out, move your lips/open your mouth, and replenish your breath. Speech intelligibility was judged to be 90% intelligible during conversation, with reminders to refrain from holding breath while speaking. Continue to focus on breath support in structured and unstructured speech tasks next session.   ? Speech Therapy Frequency 2x / week   ? Duration 4 weeks   ? Treatment/Interventions SLP instruction and feedback;Compensatory strategies;Compensatory techniques;Patient/family education;Cueing hierarchy   ? Potential to Achieve Goals Good   ? SLP Home Exercise Plan Pt will completed HEP as assigned to facilitate carryover of treatment strategies and techniques in home environment with use of written cues as needed.   ? Consulted and Agree with Plan of Care Patient   ? ?  ?  ? ?  ? ? ?Patient will benefit from skilled therapeutic intervention in order to improve the following deficits and impairments:   ?Dysarthria and anarthria ? ? ? ?Problem List ?Patient Active Problem List  ? Diagnosis Date Noted  ? Controlled type 2 diabetes mellitus with complication, without long-term current use of insulin ([place]) 08/25/2021  ? CKD (chronic kidney disease) stage 3, GFR 30-59 ml/min (HCC) 08/25/2021  ? Benign prostatic hyperplasia without lower urinary tract symptoms 08/25/2021  ? Insomnia 08/25/2021  ? Screening for colon cancer 08/25/2021  ? HOCM (hypertrophic obstructive cardiomyopathy) ([place]) 06/09/2021  ? CVA (cerebral vascular accident) ([place]) 05/20/2021  ? Dyslipidemia   ? Dysphagia, post-stroke   ? Acute CVA (cerebrovascular accident) ([place]) 05/17/2021  ? Stroke ([place]) 05/16/2021  ? DMII (diabetes mellitus, type 2) ([place]) 05/16/2021  ? HTN (hypertension) 05/16/2021  ? HLD (hyperlipidemia) 05/16/2021   ? ?Thank you, ? ?[name], [place] ?[phone] ? ?Leah Skora, CCC-SLP ?10/05/2021, 9:16 AM ? ?[facility] ?[address] ?[place], [place], [address] ?Phone: [phone]   Fax:  [phone] ? ? ?Name: Brandon French ?MRN: [id number] ?Date of Birth: [birth year] ? ?

## 2021-10-06 ENCOUNTER — Other Ambulatory Visit: Payer: Self-pay

## 2021-10-06 ENCOUNTER — Ambulatory Visit (HOSPITAL_COMMUNITY): Payer: Medicare HMO | Admitting: Physical Therapy

## 2021-10-06 DIAGNOSIS — R2689 Other abnormalities of gait and mobility: Secondary | ICD-10-CM | POA: Diagnosis not present

## 2021-10-06 DIAGNOSIS — M6281 Muscle weakness (generalized): Secondary | ICD-10-CM | POA: Diagnosis not present

## 2021-10-06 DIAGNOSIS — Z1211 Encounter for screening for malignant neoplasm of colon: Secondary | ICD-10-CM | POA: Diagnosis not present

## 2021-10-06 DIAGNOSIS — I10 Essential (primary) hypertension: Secondary | ICD-10-CM

## 2021-10-06 DIAGNOSIS — R471 Dysarthria and anarthria: Secondary | ICD-10-CM | POA: Diagnosis not present

## 2021-10-06 MED ORDER — HYDRALAZINE HCL 25 MG PO TABS: 25.0000 mg | ORAL_TABLET | Freq: Three times a day (TID) | ORAL | 1 refills | Status: DC

## 2021-10-06 NOTE — Telephone Encounter (Signed)
Rx sent 

## 2021-10-06 NOTE — Therapy (Signed)
?OUTPATIENT PHYSICAL THERAPY TREATMENT NOTE ? ? ?Patient Name: Brandon French ?MRN: [id number] ?DOB:[birth year], 56 y.o., male ?Today's Date: 10/06/2021 ? ?PCP: Renee Rival, FNP ?REFERRING PROVIDER: Renee Rival, FNP ? ? PT End of Session - 10/06/21 1145   ? ? Visit Number 5   ? Number of Visits 12   ? Date for PT Re-Evaluation 10/20/21   ? Authorization Type [employer] Medicare   ? Authorization Time Period 12 visits approved 2/9-3/23   ? Authorization - Visit Number 5   ? Authorization - Number of Visits 12   ? Progress Note Due on Visit 10   ? PT Start Time 1138   ? PT Stop Time 1218   ? PT Time Calculation (min) 40 min   ? Activity Tolerance Patient tolerated treatment well   ? Behavior During Therapy [facility] for tasks assessed/performed   ? ?  ?  ? ?  ? ? ?Past Medical History:  ?Diagnosis Date  ? Chronic kidney disease   ? Diabetes mellitus ([place])   ? Elevated cholesterol   ? High blood pressure   ? ?Past Surgical History:  ?Procedure Laterality Date  ? TRANSURETHRAL RESECTION OF PROSTATE  2020  ? ?Patient Active Problem List  ? Diagnosis Date Noted  ? Controlled type 2 diabetes mellitus with complication, without long-term current use of insulin ([place]) 08/25/2021  ? CKD (chronic kidney disease) stage 3, GFR 30-59 ml/min (HCC) 08/25/2021  ? Benign prostatic hyperplasia without lower urinary tract symptoms 08/25/2021  ? Insomnia 08/25/2021  ? Screening for colon cancer 08/25/2021  ? HOCM (hypertrophic obstructive cardiomyopathy) ([place]) 06/09/2021  ? CVA (cerebral vascular accident) ([place]) 05/20/2021  ? Dyslipidemia   ? Dysphagia, post-stroke   ? Acute CVA (cerebrovascular accident) ([place]) 05/17/2021  ? Stroke ([place]) 05/16/2021  ? DMII (diabetes mellitus, type 2) ([place]) 05/16/2021  ? HTN (hypertension) 05/16/2021  ? HLD (hyperlipidemia) 05/16/2021  ? ? ?REFERRING DIAG: LT side weakness s/p CVA  ? ?THERAPY DIAG:  ?Muscle weakness (generalized) ? ?Other abnormalities of gait and mobility ? ?PERTINENT HISTORY: CVA  04/1721  ? ?PRECAUTIONS: fall ? ?SUBJECTIVE: Pt states he has been doing his exercises.  States he has to use the bathroom a lot because of all the medications. ? ?PAIN:  ?Are you having pain? No ? ? ? ?TODAY'S TREATMENT:  ?10/06/21 ?heelraises ?Hip Flexion 20 reps with UE assist ?2 sets;10 reps, high holds with 1 UE   ?  Side Lunges   Both;2 sets;10 reps   ?Side Lunges Limitations onto 4" step without UE assist   ?Hip Abduction 2 sets 10 reps  ?Hip Extension Both;2 sets;10 reps   ?Lateral Step Up Both;2 sets;Hand Hold: 1;Step Height: 4"   ?Forward Step Up Both;2 sets;Hand Hold: 1;Hand Hold: 2;Step Height: 4"   ?SLS with Vectors bil 5x3" with 1 UE assist   ?Sit to Sand 10 reps standard chair with black foam in chair, no UE's  ? ? ? ? ?HOME EXERCISE PROGRAM: ?Access Code: [email or web address] ?URL: [email or web address] ?Date: 09/08/2021 ?Prepared by: Josue Hector ?  ? ? PT Short Term Goals - 09/08/21 1510   ? ?  ? PT SHORT TERM GOAL #1  ? Title Patient will be independent with initial HEP and self-management strategies to improve functional outcomes   ? Time 3   ? Period Weeks   ? Status New   ? Target Date 09/29/21   ?  ? PT SHORT TERM GOAL #2  ? Title Patient will be  able to perform stand x 5 in < 30 seconds to demonstrate improvement in functional mobility and reduced risk for falls.   ? Time 3   ? Period Weeks   ? Status New   ? Target Date 09/29/21   ? ?  ?  ? ?  ? ? ? PT Long Term Goals - 09/08/21 1510   ? ?  ? PT LONG TERM GOAL #1  ? Title Patient will be able to perform stand x 5 in < 15 seconds to demonstrate improvement in functional mobility and reduced risk for falls.   ? Time 6   ? Period Weeks   ? Status New   ? Target Date 10/20/21   ?  ? PT LONG TERM GOAL #2  ? Title Patient will have equal to or > 4+/5 MMT throughout BLE to improve ability to perform functional mobility, stair ambulation and ADLs.   ? Time 6   ? Period Weeks   ? Status New   ? Target Date 10/20/21   ?  ? PT LONG TERM GOAL #3  ?  Title Patient will be able to ambulate at least 275 feet during 2MWT with LRAD to demonstrate improved ability to perform functional mobility and associated tasks.   ? Time 6   ? Period Weeks   ? Status New   ? Target Date 10/20/21   ?  ? PT LONG TERM GOAL #4  ? Title Patient will be independent with advanced HEP and self-management strategies to improve functional outcomes   ? Time 6   ? Period Weeks   ? Status New   ? Target Date 10/20/21   ? ?  ?  ? ?  ? ?Plan - 10/06/21   ?  ?  Clinical Impression Statement Continued with LE strengthening exercises.  Pt continued to need cues to maintain upright posturing and use mm not momentum to complete activities, however not as many as previous sessions.  Pt able to complete standing from standard chair without UE but with additional riser of black foam needed.  Pt required 4 seated rest breaks during session today with fatigued verbalized at end of session.  Pt will continue to benefit from skilled PT to increase strength and overall functional mobility ?  ?  Personal Factors and Comorbidities Comorbidity 3+   ?  Comorbidities DM, HTN, CVA   ?  Examination-Activity Limitations Locomotion Level;Transfers;Stand;Stairs;Squat   ?  Examination-Participation Restrictions Community Activity;Laundry;Yard Work;Meal Prep;Cleaning   ?  Stability/Clinical Decision Making Evolving/Moderate complexity   ?  Rehab Potential Good   ?  PT Frequency 2x / week   ?  PT Duration 6 weeks   ?  PT Treatment/Interventions ADLs/Self Care Home Management;Aquatic Therapy;Biofeedback;DME Instruction;Gait training;Cryotherapy;Stair training;Electrical Stimulation;Functional mobility training;Iontophoresis 4mg /ml Dexamethasone;Therapeutic activities;Orthotic Fit/Training;Energy conservation;Splinting;Taping;Vasopneumatic Device;Manual techniques;Therapeutic exercise;Moist Heat;Traction;Balance training;Manual lymph drainage;Vestibular;Contrast Bath;Parrafin;Ultrasound;Neuromuscular  re-education;Compression bandaging;Scar mobilization;Visual/perceptual remediation/compensation;Cognitive remediation;Patient/family education;Passive range of motion;Dry needling;Spinal Manipulations;Joint Manipulations   ?  PT Next Visit Plan Progress LE strength as able. Continue to progress to gait and balance as tolerated   ?  PT Home Exercise Plan Eval: seated march, heel/ toe raise, LAQ   ?  Consulted and Agree with Plan of Care Patient   ?  ?   ? ? ? ?Philo Kurtz Sula Soda, PTA/CLT, WTA ?[phone] ? ? ?Roseanne Reno B, PTA ?10/06/2021, 12:03 PM ? ?   ?

## 2021-10-11 ENCOUNTER — Encounter (HOSPITAL_COMMUNITY): Payer: Self-pay | Admitting: Physical Therapy

## 2021-10-11 ENCOUNTER — Other Ambulatory Visit: Payer: Self-pay

## 2021-10-11 ENCOUNTER — Encounter (HOSPITAL_COMMUNITY): Payer: Self-pay | Admitting: Speech Pathology

## 2021-10-11 ENCOUNTER — Ambulatory Visit (HOSPITAL_COMMUNITY): Payer: Medicare HMO | Admitting: Speech Pathology

## 2021-10-11 ENCOUNTER — Ambulatory Visit (HOSPITAL_COMMUNITY): Payer: Medicare HMO | Admitting: Physical Therapy

## 2021-10-11 DIAGNOSIS — R471 Dysarthria and anarthria: Secondary | ICD-10-CM

## 2021-10-11 DIAGNOSIS — R2689 Other abnormalities of gait and mobility: Secondary | ICD-10-CM

## 2021-10-11 DIAGNOSIS — M6281 Muscle weakness (generalized): Secondary | ICD-10-CM | POA: Diagnosis not present

## 2021-10-11 NOTE — Therapy (Signed)
?OUTPATIENT PHYSICAL THERAPY TREATMENT NOTE ? ? ?Patient Name: Brandon French ?MRN: [id number] ?DOB:[birth year], 56 y.o., male ?Today's Date: 10/11/2021 ? ?PCP: Renee Rival, FNP ?REFERRING PROVIDER: Charlett Blake MD ? ? PT End of Session - 10/11/21 1432   ? ? Visit Number 6   ? Number of Visits 12   ? Date for PT Re-Evaluation 10/20/21   ? Authorization Type [employer]   ? Authorization Time Period 12 visits approved 2/9-3/23   ? Authorization - Visit Number 6   ? Authorization - Number of Visits 12   ? Progress Note Due on Visit 10   ? PT Start Time 1435   ? PT Stop Time [id number]   ? PT Time Calculation (min) 39 min   ? Activity Tolerance Patient tolerated treatment well   ? Behavior During Therapy [facility] for tasks assessed/performed   ? ?  ?  ? ?  ? ? ?Past Medical History:  ?Diagnosis Date  ? Chronic kidney disease   ? Diabetes mellitus ([place])   ? Elevated cholesterol   ? High blood pressure   ? ?Past Surgical History:  ?Procedure Laterality Date  ? TRANSURETHRAL RESECTION OF PROSTATE  2020  ? ?Patient Active Problem List  ? Diagnosis Date Noted  ? Controlled type 2 diabetes mellitus with complication, without long-term current use of insulin ([place]) 08/25/2021  ? CKD (chronic kidney disease) stage 3, GFR 30-59 ml/min (HCC) 08/25/2021  ? Benign prostatic hyperplasia without lower urinary tract symptoms 08/25/2021  ? Insomnia 08/25/2021  ? Screening for colon cancer 08/25/2021  ? HOCM (hypertrophic obstructive cardiomyopathy) ([place]) 06/09/2021  ? CVA (cerebral vascular accident) ([place]) 05/20/2021  ? Dyslipidemia   ? Dysphagia, post-stroke   ? Acute CVA (cerebrovascular accident) ([place]) 05/17/2021  ? Stroke ([place]) 05/16/2021  ? DMII (diabetes mellitus, type 2) ([place]) 05/16/2021  ? HTN (hypertension) 05/16/2021  ? HLD (hyperlipidemia) 05/16/2021  ? ? ?REFERRING DIAG: LT side weakness s/p CVA  ? ?THERAPY DIAG:  ?Muscle weakness (generalized) ? ?Other abnormalities of gait and mobility ? ?PERTINENT HISTORY: CVA  04/1721  ? ?PRECAUTIONS: fall ? ?SUBJECTIVE: Pt reports no new issues. Doing well with HEP. ? ?PAIN:  ?Are you having pain? No ? ? ? ?TODAY'S TREATMENT:  ?10/11/21 ?Heel raises 2 x10 ?Standing march 3 x 10 2lb ?Standing hip abduction/ extension 3 x 10 each with 2lb  ?Sit to stand from elevated seat 2 x 10  ?6 inch step taps 2 x10 HHA x 1 ? ? ?10/06/21 ?heelraises ?Hip Flexion 20 reps with UE assist ?2 sets;10 reps, high holds with 1 UE   ?  Side Lunges   Both;2 sets;10 reps   ?Side Lunges Limitations onto 4" step without UE assist   ?Hip Abduction 2 sets 10 reps  ?Hip Extension Both;2 sets;10 reps   ?Lateral Step Up Both;2 sets;Hand Hold: 1;Step Height: 4"   ?Forward Step Up Both;2 sets;Hand Hold: 1;Hand Hold: 2;Step Height: 4"   ?SLS with Vectors bil 5x3" with 1 UE assist   ?Sit to Sand 10 reps standard chair with black foam in chair, no UE's  ? ? ? ? ?HOME EXERCISE PROGRAM: ?Eval: seated march, heel/ toe raise, LAQ  ?Access Code: [email or web address] ?URL: [email or web address] ?Date: 09/08/2021 ?Prepared by: Josue Hector ?Access Code: [email or web address] ?URL: [email or web address] ?Date: 10/11/2021 ?Prepared by: Josue Hector ? ?Exercises ?Heel Raises with Counter Support - 2 x daily - 7 x weekly - 3 sets - 10 reps ?Standing Hip Abduction with Counter Support -  2 x daily - 7 x weekly - 3 sets - 10 reps ?Standing Hip Extension with Counter Support - 2 x daily - 7 x weekly - 3 sets - 10 reps ?Standing March with Counter Support - 2 x daily - 7 x weekly - 3 sets - 10 reps ? ?  ? ? PT Short Term Goals - 09/08/21 1510   ? ?  ? PT SHORT TERM GOAL #1  ? Title Patient will be independent with initial HEP and self-management strategies to improve functional outcomes   ? Time 3   ? Period Weeks   ? Status New   ? Target Date 09/29/21   ?  ? PT SHORT TERM GOAL #2  ? Title Patient will be able to perform stand x 5 in < 30 seconds to demonstrate improvement in functional mobility and reduced risk for falls.   ? Time 3    ? Period Weeks   ? Status New   ? Target Date 09/29/21   ? ?  ?  ? ?  ? ? ? PT Long Term Goals - 09/08/21 1510   ? ?  ? PT LONG TERM GOAL #1  ? Title Patient will be able to perform stand x 5 in < 15 seconds to demonstrate improvement in functional mobility and reduced risk for falls.   ? Time 6   ? Period Weeks   ? Status New   ? Target Date 10/20/21   ?  ? PT LONG TERM GOAL #2  ? Title Patient will have equal to or > 4+/5 MMT throughout BLE to improve ability to perform functional mobility, stair ambulation and ADLs.   ? Time 6   ? Period Weeks   ? Status New   ? Target Date 10/20/21   ?  ? PT LONG TERM GOAL #3  ? Title Patient will be able to ambulate at least 275 feet during 2MWT with LRAD to demonstrate improved ability to perform functional mobility and associated tasks.   ? Time 6   ? Period Weeks   ? Status New   ? Target Date 10/20/21   ?  ? PT LONG TERM GOAL #4  ? Title Patient will be independent with advanced HEP and self-management strategies to improve functional outcomes   ? Time 6   ? Period Weeks   ? Status New   ? Target Date 10/20/21   ? ?  ?  ? ?  ? ?Plan - 10/06/21   ?  ?  Clinical Impression Statement Patient tolerated session well today. Progressed LE strength with increased reps and added weights to standing hip abduction and extension exercises. Patient noting increased fatigue and requires several breaks for rest. Added step taps for balance. Patient requires HHA x 1 for stability. Patient will continue to benefit from skilled therapy services to reduce remaining deficits and improve functional ability.  ? ?  ?  Personal Factors and Comorbidities Comorbidity 3+   ?  Comorbidities DM, HTN, CVA   ?  Examination-Activity Limitations Locomotion Level;Transfers;Stand;Stairs;Squat   ?  Examination-Participation Restrictions Community Activity;Laundry;Yard Work;Meal Prep;Cleaning   ?  Stability/Clinical Decision Making Evolving/Moderate complexity   ?  Rehab Potential Good   ?  PT Frequency 2x /  week   ?  PT Duration 6 weeks   ?  PT Treatment/Interventions ADLs/Self Care Home Management;Aquatic Therapy;Biofeedback;DME Instruction;Gait training;Cryotherapy;Stair training;Electrical Stimulation;Functional mobility training;Iontophoresis 4mg /ml Dexamethasone;Therapeutic activities;Orthotic Fit/Training;Energy conservation;Splinting;Taping;Vasopneumatic Device;Manual techniques;Therapeutic exercise;Moist Heat;Traction;Balance training;Manual lymph drainage;Vestibular;Contrast Bath;Parrafin;Ultrasound;Neuromuscular re-education;Compression  bandaging;Scar mobilization;Visual/perceptual remediation/compensation;Cognitive remediation;Patient/family education;Passive range of motion;Dry needling;Spinal Manipulations;Joint Manipulations   ?  PT Next Visit Plan Progress LE strength as able. Continue to progress to gait and balance as tolerated. Add sidestepping.   ?     ?     ?  ?   ? ? ?3:15 PM, 10/11/21 ?Josue Hector PT DPT  ?Physical Therapist with [facility]  ?[facility]  ?[phone] ? ? ?   ?

## 2021-10-11 NOTE — Therapy (Signed)
[facility] ?[address] ?[place], [place], [address] ?Phone: [phone]   Fax:  [phone] ? ?Speech Language Pathology Treatment ? ?Patient Details  ?Name: Brandon French ?MRN: [id number] ?Date of Birth: April 13, 1966 ?Referring Provider (SLP): Alysia Penna, MD ? ? ?Encounter Date: 10/11/2021 ? ? End of Session - 10/11/21 1601   ? ? Visit Number 5   ? Number of Visits 8   ? Date for SLP Re-Evaluation 10/13/21   ? Authorization Type [employer] Medicare   8 visits approved through 10/13/21  ? SLP Start Time 1515   ? SLP Stop Time  1600   ? SLP Time Calculation (min) 45 min   ? Activity Tolerance Patient tolerated treatment well   ? ?  ?  ? ?  ? ? ?Past Medical History:  ?Diagnosis Date  ? Chronic kidney disease   ? Diabetes mellitus ([place])   ? Elevated cholesterol   ? High blood pressure   ? ? ?Past Surgical History:  ?Procedure Laterality Date  ? TRANSURETHRAL RESECTION OF PROSTATE  2020  ? ? ?There were no vitals filed for this visit. ? ? Subjective Assessment - 10/11/21 1526   ? ? Subjective "Everyone can understand me, but my Mom thinks I am yelling at her."   ? Currently in Pain? No/denies   ? ?  ?  ? ?  ? ? ? ADULT SLP TREATMENT - 10/11/21 1600   ? ?  ? General Information  ? Behavior/Cognition Alert;Cooperative;Pleasant mood   ? Patient Positioning Upright in chair   ? Oral care provided N/A   ? HPI 56 year old male with history of diabetes mellitus type 2, hypertension, hyperlipidemia, unspecified stroke with possible residual left-sided weakness presented to [facility] ED on 05/16/21 with weakness and slurred speech.  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  He failed swallow screen and NG tube was placed. MRI shows Acute nonhemorrhagic 12 mm infarct in the posterior limb of the left internal capsule or lateral thalamus. He attended inpatient rehab at [facility] from 10/21-11/8/22 and was discharged home with [facility]. His last MBSS was  07/14/21 with recommendation for NTL. He was referred for ongoing SLP therapy to address dysarthria and dysphagia by Dr. Alysia Penna.   ?  ? Treatment Provided  ? Treatment provided Cognitive-Linquistic   ?  ? Pain Assessment  ? Pain Assessment No/denies pain   ?  ? Cognitive-Linquistic Treatment  ? Treatment focused on Dysarthria;Voice;Patient/family/caregiver education   ? Skilled Treatment SLP guided Pt through abdominal breathing, sustained phonation tasks, cues to increase swallow frequency, vocal fold unloading tasks, and implementation of speech intelligibilty strategies in structured and unstructured tasks.   ?  ? Assessment / Recommendations / Plan  ? Plan Discharge SLP treatment due to (comment);All goals met   ?  ? Progression Toward Goals  ? Progression toward goals Goals met, education completed, patient discharged from SLP   ? ?  ?  ? ?  ? ? ? ? ? SLP Short Term Goals - 10/11/21 1602   ? ?  ? SLP SHORT TERM GOAL #1  ? Title Pt will implement speech intelligibility strategies during conversational speech (decrease rate, self correct errors, self monitoring, checking to see if listener understands) with min assist.   ? Baseline 4   ? Time 4   ? Period Weeks   ? Status Achieved   ? Target Date 10/13/21   ?  ?  SLP SHORT TERM GOAL #2  ? Title Pt will demonstrate balanced coordination of respiration with phonation, as demonstrated by continuous periodicity (no glottal fry) with 85% or more accuracy at the sentence, paragraph level and in conversation.   ? Baseline 4   ? Time 4   ? Period Weeks   ? Status Partially Met   ? Target Date 10/13/21   ?  ? SLP SHORT TERM GOAL #3  ? Title Improve breath support during speech (not speaking to end of breath)   ? Baseline 4   ? Time 4   ? Period Weeks   ? Status Achieved   ? Target Date 10/13/21   ? ?  ?  ? ?  ? ? ? ? ? Plan - 10/11/21 1602   ? ? Clinical Impression Statement Pt continues with mild/moderate dysarthria in setting of reduced breath support and  laryngeal tension. Pt has made significant improvements in speech intelligibility and conversational partners request clarifications less. He needs to continue to focus on swallowing once every 30 seconds (this has helped control labial spillage of saliva greatly) and to replenish breath frequently. He is more aware of when he sounds "strangled" and is able to stop and replenish breath. He is pleased with his progress. He is consuming a regular diet and thin liquids with occasional coughing with thins. He knows that he should contact his doctor should he develop shortness of breath, chest congestion, and fever due to aspiration risks. He was given HEP and should continued to complete going forward. Will d/c SLP services at this time. Pt is in agreement with plan of care.   ? Treatment/Interventions SLP instruction and feedback;Compensatory strategies;Compensatory techniques;Patient/family education;Cueing hierarchy   ? Potential to Achieve Goals Good   ? SLP Home Exercise Plan Pt will completed HEP as assigned to facilitate carryover of treatment strategies and techniques in home environment with use of written cues as needed.   ? Consulted and Agree with Plan of Care Patient   ? ?  ?  ? ?  ? ? ?Patient will benefit from skilled therapeutic intervention in order to improve the following deficits and impairments:   ?Dysarthria and anarthria ? ? ? ?Problem List ?Patient Active Problem List  ? Diagnosis Date Noted  ? Controlled type 2 diabetes mellitus with complication, without long-term current use of insulin ([place]) 08/25/2021  ? CKD (chronic kidney disease) stage 3, GFR 30-59 ml/min (HCC) 08/25/2021  ? Benign prostatic hyperplasia without lower urinary tract symptoms 08/25/2021  ? Insomnia 08/25/2021  ? Screening for colon cancer 08/25/2021  ? HOCM (hypertrophic obstructive cardiomyopathy) ([place]) 06/09/2021  ? CVA (cerebral vascular accident) ([place]) 05/20/2021  ? Dyslipidemia   ? Dysphagia, post-stroke   ? Acute CVA  (cerebrovascular accident) ([place]) 05/17/2021  ? Stroke ([place]) 05/16/2021  ? DMII (diabetes mellitus, type 2) ([place]) 05/16/2021  ? HTN (hypertension) 05/16/2021  ? HLD (hyperlipidemia) 05/16/2021  ?SPEECH THERAPY DISCHARGE SUMMARY ? ?Visits from Start of Care: 5 ? ?Current functional level related to goals / functional outcomes: ?See above ?  ?Remaining deficits: ?See above, mild/mod dysarthria ?  ?Education / Equipment: ?HEP provided and Pt encouraged to continue post discharge  ? ?Patient agrees to discharge. Patient goals were met. Patient is being discharged due to being pleased with the current functional level.. ? ? ? ?Thank you, ? ?[name], [place] ?[phone] ? ?Omesha Bowerman, CCC-SLP ?10/11/2021, 4:03 PM ? ?[facility] [place] [facility] ?[address] ?[place], [place], [address] ?  Phone: [phone]   Fax:  [phone] ? ? ?Name: JACE FERMIN ?MRN: [id number] ?Date of Birth: [birth year] ? ?

## 2021-10-12 NOTE — Therapy (Signed)
?OUTPATIENT PHYSICAL THERAPY TREATMENT NOTE ? ? ?Patient Name: Brandon French ?MRN: [id number] ?DOB:September 13, 1965, 56 y.o., male ?Today's Date: 10/13/2021 ? ?PCP: Donell Beers, FNP ?REFERRING PROVIDER: Erick Colace MD ? ? PT End of Session - 10/13/21 1119   ? ? Visit Number 7   ? Number of Visits 12   ? Date for PT Re-Evaluation 10/20/21   ? Authorization Type [employer]   ? Authorization Time Period 12 visits approved 2/9-3/23   ? Authorization - Visit Number 7   ? Authorization - Number of Visits 12   ? Progress Note Due on Visit 10   ? PT Start Time 1116   ? PT Stop Time 1159   ? PT Time Calculation (min) 43 min   ? Activity Tolerance Patient tolerated treatment well   ? Behavior During Therapy [facility] for tasks assessed/performed   ? ?  ?  ? ?  ? ? ? ?Past Medical History:  ?Diagnosis Date  ? Chronic kidney disease   ? Diabetes mellitus (HCC)   ? Elevated cholesterol   ? High blood pressure   ? ?Past Surgical History:  ?Procedure Laterality Date  ? TRANSURETHRAL RESECTION OF PROSTATE  2020  ? ?Patient Active Problem List  ? Diagnosis Date Noted  ? Controlled type 2 diabetes mellitus with complication, without long-term current use of insulin (HCC) 08/25/2021  ? CKD (chronic kidney disease) stage 3, GFR 30-59 ml/min (HCC) 08/25/2021  ? Benign prostatic hyperplasia without lower urinary tract symptoms 08/25/2021  ? Insomnia 08/25/2021  ? Screening for colon cancer 08/25/2021  ? HOCM (hypertrophic obstructive cardiomyopathy) (HCC) 06/09/2021  ? CVA (cerebral vascular accident) (HCC) 05/20/2021  ? Dyslipidemia   ? Dysphagia, post-stroke   ? Acute CVA (cerebrovascular accident) (HCC) 05/17/2021  ? Stroke (HCC) 05/16/2021  ? DMII (diabetes mellitus, type 2) (HCC) 05/16/2021  ? HTN (hypertension) 05/16/2021  ? HLD (hyperlipidemia) 05/16/2021  ? ? ?REFERRING DIAG: LT side weakness s/p CVA  ? ?THERAPY DIAG:  ?Dysarthria and anarthria ? ?Muscle weakness (generalized) ? ?Other abnormalities of gait and  mobility ? ?PERTINENT HISTORY: CVA 04/1721  ? ?PRECAUTIONS: fall ? ?SUBJECTIVE:  Patient reports no pain today and no new falls. Reports compliance with HEP ? ?PAIN:  ?Are you having pain? No ? ? ? ?TODAY'S TREATMENT:  ?10/13/21 ?Heel raises 2 x10 ?Standing march 3 x 10  ?Standing hip abduction/ extension 3 x 10 each with 2lb  ?Sit to stand from elevated seat 2 x 10  ?6 inch step taps 2 x10 HHA x 1 ?Sidestepping counter x 6 ft x 4 passes ?Tandem stance at counter 2 x 20 sec  ? ?*CGA needed for safety with sit to stand, sidestepping and tandem stance ? ? ? ? ?10/06/21 ?heelraises ?Hip Flexion 20 reps with UE assist ?2 sets;10 reps, high holds with 1 UE   ?  Side Lunges   Both;2 sets;10 reps   ?Side Lunges Limitations onto 4" step without UE assist   ?Hip Abduction 2 sets 10 reps  ?Hip Extension Both;2 sets;10 reps   ?Lateral Step Up Both;2 sets;Hand Hold: 1;Step Height: 4"   ?Forward Step Up Both;2 sets;Hand Hold: 1;Hand Hold: 2;Step Height: 4"   ?SLS with Vectors bil 5x3" with 1 UE assist   ?Sit to Stand 10 reps standard chair with black foam in chair, no UE's  ? ? ? ? ?HOME EXERCISE PROGRAM: ?Eval: seated march, heel/ toe raise, LAQ  ?Access Code: [email or web address] ?URL: [email or web address] ?Date: 09/08/2021 ?Prepared  by: Josue Hector ?Access Code: [email or web address] ?URL: [email or web address] ?Date: 10/11/2021 ?Prepared by: Josue Hector ? ?Exercises ?Heel Raises with Counter Support - 2 x daily - 7 x weekly - 3 sets - 10 reps ?Standing Hip Abduction with Counter Support - 2 x daily - 7 x weekly - 3 sets - 10 reps ?Standing Hip Extension with Counter Support - 2 x daily - 7 x weekly - 3 sets - 10 reps ?Standing March with Counter Support - 2 x daily - 7 x weekly - 3 sets - 10 reps ? ?Access Code: [email or web address] ?URL: [email or web address] ?Date: 10/13/2021 ?Prepared by: [facility] ? ?Exercises ?Side Stepping with Counter Support - 2 x daily - 7 x weekly - 5 sets - 10 reps ?Standing Tandem  Balance with Counter Support - 2 x daily - 7 x weekly - 3 sets - 1 reps - 20 sec hold ? ? ?  ? ? PT Short Term Goals - 09/08/21 1510   ? ?  ? PT SHORT TERM GOAL #1  ? Title Patient will be independent with initial HEP and self-management strategies to improve functional outcomes   ? Time 3   ? Period Weeks   ? Status New   ? Target Date 09/29/21   ?  ? PT SHORT TERM GOAL #2  ? Title Patient will be able to perform stand x 5 in < 30 seconds to demonstrate improvement in functional mobility and reduced risk for falls.   ? Time 3   ? Period Weeks   ? Status New   ? Target Date 09/29/21   ? ?  ?  ? ?  ? ? ? PT Long Term Goals - 09/08/21 1510   ? ?  ? PT LONG TERM GOAL #1  ? Title Patient will be able to perform stand x 5 in < 15 seconds to demonstrate improvement in functional mobility and reduced risk for falls.   ? Time 6   ? Period Weeks   ? Status New   ? Target Date 10/20/21   ?  ? PT LONG TERM GOAL #2  ? Title Patient will have equal to or > 4+/5 MMT throughout BLE to improve ability to perform functional mobility, stair ambulation and ADLs.   ? Time 6   ? Period Weeks   ? Status New   ? Target Date 10/20/21   ?  ? PT LONG TERM GOAL #3  ? Title Patient will be able to ambulate at least 275 feet during 2MWT with LRAD to demonstrate improved ability to perform functional mobility and associated tasks.   ? Time 6   ? Period Weeks   ? Status New   ? Target Date 10/20/21   ?  ? PT LONG TERM GOAL #4  ? Title Patient will be independent with advanced HEP and self-management strategies to improve functional outcomes   ? Time 6   ? Period Weeks   ? Status New   ? Target Date 10/20/21   ? ?  ?  ? ?  ? ?  ?  ?  Clinical Impression Statement Patient tolerated session well today. Progressed LE strength with increased reps and added weights to standing hip abduction and extension exercises. Patient noting increased fatigue and requires several breaks for rest. Added step taps for balance. Patient requires HHA x 1 for stability.  Patient will continue to benefit from skilled therapy services to reduce remaining deficits and improve functional ability.  ? ?  ?  Personal Factors and Comorbidities Comorbidity 3+   ?  Comorbidities DM, HTN, CVA   ?  Examination-Activity Limitations Locomotion Level;Transfers;Stand;Stairs;Squat   ?  Examination-Participation Restrictions Community Activity;Laundry;Yard Work;Meal Prep;Cleaning   ?  Stability/Clinical Decision Making Evolving/Moderate complexity   ?  Rehab Potential Good   ?  PT Frequency 2x / week   ?  PT Duration 6 weeks   ?  PT Treatment/Interventions ADLs/Self Care Home Management;Aquatic Therapy;Biofeedback;DME Instruction;Gait training;Cryotherapy;Stair training;Electrical Stimulation;Functional mobility training;Iontophoresis 4mg /ml Dexamethasone;Therapeutic activities;Orthotic Fit/Training;Energy conservation;Splinting;Taping;Vasopneumatic Device;Manual techniques;Therapeutic exercise;Moist Heat;Traction;Balance training;Manual lymph drainage;Vestibular;Contrast Bath;Parrafin;Ultrasound;Neuromuscular re-education;Compression bandaging;Scar mobilization;Visual/perceptual remediation/compensation;Cognitive remediation;Patient/family education;Passive range of motion;Dry needling;Spinal Manipulations;Joint Manipulations   ?  PT Next Visit Plan Progress LE strength and balance exercise as able.    ?     ?     ?  ?   ? ? ?12:02 PM, 10/13/21 ?Charnell Peplinski [place] Mccort MPT ?[place] [facility] ?[place] [phone] ?[phone] ? ?   ?

## 2021-10-13 ENCOUNTER — Encounter: Payer: Medicare HMO | Admitting: Physical Medicine & Rehabilitation

## 2021-10-13 ENCOUNTER — Ambulatory Visit (HOSPITAL_COMMUNITY): Payer: Medicare HMO

## 2021-10-13 ENCOUNTER — Other Ambulatory Visit: Payer: Self-pay

## 2021-10-13 DIAGNOSIS — R471 Dysarthria and anarthria: Secondary | ICD-10-CM

## 2021-10-13 DIAGNOSIS — M6281 Muscle weakness (generalized): Secondary | ICD-10-CM

## 2021-10-13 DIAGNOSIS — R2689 Other abnormalities of gait and mobility: Secondary | ICD-10-CM | POA: Diagnosis not present

## 2021-10-13 LAB — COLOGUARD: COLOGUARD: NEGATIVE

## 2021-10-17 ENCOUNTER — Encounter (HOSPITAL_COMMUNITY): Payer: Medicare HMO | Admitting: Speech Pathology

## 2021-10-18 ENCOUNTER — Encounter (HOSPITAL_COMMUNITY): Payer: Medicare HMO | Admitting: Physical Therapy

## 2021-10-18 ENCOUNTER — Telehealth (HOSPITAL_COMMUNITY): Payer: Self-pay | Admitting: Physical Therapy

## 2021-10-18 NOTE — Telephone Encounter (Signed)
Lack of Transportation °

## 2021-10-20 ENCOUNTER — Encounter (HOSPITAL_COMMUNITY): Payer: Medicare HMO | Admitting: Speech Pathology

## 2021-10-20 ENCOUNTER — Ambulatory Visit (HOSPITAL_COMMUNITY): Payer: Medicare HMO | Admitting: Physical Therapy

## 2021-10-20 ENCOUNTER — Other Ambulatory Visit: Payer: Self-pay

## 2021-10-20 ENCOUNTER — Encounter (HOSPITAL_COMMUNITY): Payer: Self-pay | Admitting: Physical Therapy

## 2021-10-20 DIAGNOSIS — R471 Dysarthria and anarthria: Secondary | ICD-10-CM | POA: Diagnosis not present

## 2021-10-20 DIAGNOSIS — M6281 Muscle weakness (generalized): Secondary | ICD-10-CM

## 2021-10-20 DIAGNOSIS — R2689 Other abnormalities of gait and mobility: Secondary | ICD-10-CM

## 2021-10-20 NOTE — Therapy (Signed)
?OUTPATIENT PHYSICAL THERAPY TREATMENT NOTE ? ? ?Patient Name: Brandon French ?MRN: [id number] ?DOB:05/22/1966, 55 y.o., male ?Today's Date: 10/13/2021 ?PROGRESS NOTE ?PCP: Paseda, Folashade R, FNP ?REFERRING PROVIDER: Kirsteins, Andrew E MD ? ? PT End of Session - 10/20/21 0905   ? ? Visit Number 8   ? Number of Visits 16   ? Date for PT Re-Evaluation 11/17/21   ? Authorization Type [employer]   ? Authorization Time Period Submitted for 8 more visits on 3/23   ? Authorization - Visit Number 7   ? Authorization - Number of Visits 12   ? Progress Note Due on Visit 16   ? PT Start Time 0903   ? PT Stop Time 0943   ? PT Time Calculation (min) 40 min   ? Activity Tolerance Patient tolerated treatment well   ? Behavior During Therapy WFL for tasks assessed/performed   ? ?  ?  ? ?  ? ? ? ?Past Medical History:  ?Diagnosis Date  ? Chronic kidney disease   ? Diabetes mellitus (HCC)   ? Elevated cholesterol   ? High blood pressure   ? ?Past Surgical History:  ?Procedure Laterality Date  ? TRANSURETHRAL RESECTION OF PROSTATE  2020  ? ?Patient Active Problem List  ? Diagnosis Date Noted  ? Controlled type 2 diabetes mellitus with complication, without long-term current use of insulin (HCC) 08/25/2021  ? CKD (chronic kidney disease) stage 3, GFR 30-59 ml/min (HCC) 08/25/2021  ? Benign prostatic hyperplasia without lower urinary tract symptoms 08/25/2021  ? Insomnia 08/25/2021  ? Screening for colon cancer 08/25/2021  ? HOCM (hypertrophic obstructive cardiomyopathy) (HCC) 06/09/2021  ? CVA (cerebral vascular accident) (HCC) 05/20/2021  ? Dyslipidemia   ? Dysphagia, post-stroke   ? Acute CVA (cerebrovascular accident) (HCC) 05/17/2021  ? Stroke (HCC) 05/16/2021  ? DMII (diabetes mellitus, type 2) (HCC) 05/16/2021  ? HTN (hypertension) 05/16/2021  ? HLD (hyperlipidemia) 05/16/2021  ? ? ?REFERRING DIAG: LT side weakness s/p CVA  ? ?THERAPY DIAG:  ?Muscle weakness (generalized) ? ?Other abnormalities of gait and  mobility ? ?PERTINENT HISTORY: CVA 05/16/21  ? ?PRECAUTIONS: fall ? ?SUBJECTIVE: Patient says he is doing well. He feels his legs are getting stronger. He feels his balance is "alright".  ? ?PAIN:  ?Are you having pain? No ? ? ? ?TODAY'S TREATMENT:  ?10/20/21 ?Standing march x20 each with 3lb ?Standing hip abduction 2 x 10 each with 3lb ?Standing hip extension 2 x 10 each with 3lb  ?Step ups 7 inch x 10 each HHA x 2 ? ? Strength                                                                                09/08/21 10/20/21  ?  Strength Assessment Site Hip;Knee;Ankle    ?  Right/Left Hip Right;Left    ?  Right Hip Flexion 4+/5  5/5  ?  Right Hip Extension 4+/5    ?  Right Hip ABduction 4+/5  4+/5  ?  Left Hip Flexion 4-/5  4+/5  ?  Left Hip Extension 3+/5    ?  Left Hip ABduction 3+/5  4/5  ?  Right/Left Knee Right;Left    ?    Right Knee Extension 4+/5  5/5  ?  Left Knee Extension 4-/5  4+/5  ?  Right/Left Ankle Right;Left    ?  Right Ankle Dorsiflexion 5/5  5/5  ?  Left Ankle Dorsiflexion 4+/5  5/5  ?  (Patient unable to get into prone for hip extension)  ?Ambulation/Gait   ?  Ambulation/Gait Yes   ?  Ambulation/Gait Assistance 5: Supervision   ?  Ambulation Distance (Feet) 180 feet (was 90)   ?  Assistive device Straight cane   ?  Gait Pattern Decreased stride length;Decreased arm swing - left;Decreased stance time - left   ?  Ambulation Surface Level;Indoor   ?  Gait Comments 2 MWT   ? ?5 x sit to stand: 22.3 sec with use of UEs ? ?10/13/21 ?Heel raises 2 x10 ?Standing march 3 x 10  ?Standing hip abduction/ extension 3 x 10 each with 2lb  ?Sit to stand from elevated seat 2 x 10  ?6 inch step taps 2 x10 HHA x 1 ?Sidestepping counter x 6 ft x 4 passes ?Tandem stance at counter 2 x 20 sec  ? ?*CGA needed for safety with sit to stand, sidestepping and tandem stance ? ? ? ? ?10/06/21 ?heelraises ?Hip Flexion 20 reps with UE assist ?2 sets;10 reps, high holds with 1 UE   ?  Side Lunges   Both;2 sets;10 reps   ?Side Lunges  Limitations onto 4" step without UE assist   ?Hip Abduction 2 sets 10 reps  ?Hip Extension Both;2 sets;10 reps   ?Lateral Step Up Both;2 sets;Hand Hold: 1;Step Height: 4"   ?Forward Step Up Both;2 sets;Hand Hold: 1;Hand Hold: 2;Step Height: 4"   ?SLS with Vectors bil 5x3" with 1 UE assist   ?Sit to Stand 10 reps standard chair with black foam in chair, no UE's  ? ? ? ? ?HOME EXERCISE PROGRAM: ?Eval: seated march, heel/ toe raise, LAQ  ?Access Code: [email or web address] ?URL: [email or web address] ?Date: 09/08/2021 ?Prepared by: Josue Hector ?Access Code: [email or web address] ?URL: [email or web address] ?Date: 10/11/2021 ?Prepared by: Josue Hector ? ?Exercises ?Heel Raises with Counter Support - 2 x daily - 7 x weekly - 3 sets - 10 reps ?Standing Hip Abduction with Counter Support - 2 x daily - 7 x weekly - 3 sets - 10 reps ?Standing Hip Extension with Counter Support - 2 x daily - 7 x weekly - 3 sets - 10 reps ?Standing March with Counter Support - 2 x daily - 7 x weekly - 3 sets - 10 reps ? ?Access Code: [email or web address] ?URL: [email or web address] ?Date: 10/13/2021 ?Prepared by: [facility] - [facility] ? ?Exercises ?Side Stepping with Counter Support - 2 x daily - 7 x weekly - 5 sets - 10 reps ?Standing Tandem Balance with Counter Support - 2 x daily - 7 x weekly - 3 sets - 1 reps - 20 sec hold ? ? ?  ? ? PT Short Term Goals - 09/08/21 1510   ? ?  ? PT SHORT TERM GOAL #1  ? Title Patient will be independent with initial HEP and self-management strategies to improve functional outcomes   ? Time 3   ? Period Weeks   ? Status MET  ? Target Date 09/29/21   ?  ? PT SHORT TERM GOAL #2  ? Title Patient will be able to perform stand x 5 in < 30 seconds to demonstrate improvement in functional mobility and reduced risk for falls.   ? Time 3   ? Period Weeks   ?  Status MET   ? Target Date 09/29/21   ? ?  ?  ? ?  ? ? ? PT Long Term Goals - 09/08/21 1510   ? ?  ? PT LONG TERM GOAL #1  ? Title Patient will be able to  perform stand x 5 in < 15 seconds to demonstrate improvement in functional mobility and reduced risk for falls.   ? Time 6   ? Period Weeks   ? Status Ongoing (Current 22.3 sec with Ues)  ? Target Date 10/20/21   ?  ? PT LONG TERM GOAL #2  ? Title Patient will have equal to or > 4+/5 MMT throughout BLE to improve ability to perform functional mobility, stair ambulation and ADLs.   ? Time 6   ? Period Weeks   ? Status Ongoing (partially met, see MMT)  ? Target Date 10/20/21   ?  ? PT LONG TERM GOAL #3  ? Title Patient will be able to ambulate at least 275 feet during 2MWT with LRAD to demonstrate improved ability to perform functional mobility and associated tasks.   ? Time 6   ? Period Weeks   ? Status Ongoing (Current 180 feet)   ? Target Date 10/20/21   ?  ? PT LONG TERM GOAL #4  ? Title Patient will be independent with advanced HEP and self-management strategies to improve functional outcomes   ? Time 6   ? Period Weeks   ? Status Ongoing   ? Target Date 10/20/21   ? ?  ?  ? ?  ? ?  ?  ?  Clinical Impression Statement Patient is making steady progress to therapy goals. Patient shows improved strength and distance ambulated. Patient remains limited by proximal weakness, decreased balance and notable gait deficits. Patient will continue to benefit from skilled therapy services to address remaining deficits for improved functional mobility and reduced risk for falls.  ?  ?  Personal Factors and Comorbidities Comorbidity 3+   ?  Comorbidities DM, HTN, CVA   ?  Examination-Activity Limitations Locomotion Level;Transfers;Stand;Stairs;Squat   ?  Examination-Participation Restrictions Community Activity;Laundry;Yard Work;Meal Prep;Cleaning   ?  Stability/Clinical Decision Making Evolving/Moderate complexity   ?  Rehab Potential Good   ?  PT Frequency 2x / week   ?  PT Duration 4 weeks   ?  PT Treatment/Interventions ADLs/Self Care Home Management;Aquatic Therapy;Biofeedback;DME Instruction;Gait training;Cryotherapy;Stair  training;Electrical Stimulation;Functional mobility training;Iontophoresis 38m/ml Dexamethasone;Therapeutic activities;Orthotic Fit/Training;Energy conservation;Splinting;Taping;Vasopneumatic Device;Manual techniques;Therapeu

## 2021-10-25 ENCOUNTER — Other Ambulatory Visit: Payer: Self-pay

## 2021-10-25 ENCOUNTER — Ambulatory Visit (HOSPITAL_COMMUNITY): Payer: Medicare HMO | Admitting: Physical Therapy

## 2021-10-25 ENCOUNTER — Encounter (HOSPITAL_COMMUNITY): Payer: Self-pay | Admitting: Physical Therapy

## 2021-10-25 ENCOUNTER — Ambulatory Visit: Payer: Medicare HMO

## 2021-10-25 DIAGNOSIS — R2689 Other abnormalities of gait and mobility: Secondary | ICD-10-CM

## 2021-10-25 DIAGNOSIS — M6281 Muscle weakness (generalized): Secondary | ICD-10-CM | POA: Diagnosis not present

## 2021-10-25 DIAGNOSIS — R471 Dysarthria and anarthria: Secondary | ICD-10-CM | POA: Diagnosis not present

## 2021-10-25 LAB — HM DIABETES EYE EXAM

## 2021-10-25 NOTE — Therapy (Signed)
?OUTPATIENT PHYSICAL THERAPY TREATMENT NOTE ? ? ?Patient Name: BRENNER VISCONTI ?MRN: [id number] ?DOB:1966-02-22, 56 y.o., male ?Today's Date: 10/13/2021 ? ?PCP: Renee Rival, FNP ?REFERRING PROVIDER: Charlett Blake MD ? ? PT End of Session - 10/25/21 1312   ? ? Visit Number 9   ? Number of Visits 16   ? Date for PT Re-Evaluation 11/17/21   ? Authorization Type [employer]   ? Authorization Time Period 8 approved 3/23-4/20/23   ? Authorization - Visit Number 1   ? Authorization - Number of Visits 8   ? Progress Note Due on Visit 16   ? PT Start Time 1309   ? PT Stop Time 1348   ? PT Time Calculation (min) 39 min   ? Activity Tolerance Patient tolerated treatment well   ? Behavior During Therapy [facility] for tasks assessed/performed   ? ?  ?  ? ?  ? ? ? ? ?Past Medical History:  ?Diagnosis Date  ? Chronic kidney disease   ? Diabetes mellitus ([place])   ? Elevated cholesterol   ? High blood pressure   ? ?Past Surgical History:  ?Procedure Laterality Date  ? TRANSURETHRAL RESECTION OF PROSTATE  2020  ? ?Patient Active Problem List  ? Diagnosis Date Noted  ? Controlled type 2 diabetes mellitus with complication, without long-term current use of insulin ([place]) 08/25/2021  ? CKD (chronic kidney disease) stage 3, GFR 30-59 ml/min (HCC) 08/25/2021  ? Benign prostatic hyperplasia without lower urinary tract symptoms 08/25/2021  ? Insomnia 08/25/2021  ? Screening for colon cancer 08/25/2021  ? HOCM (hypertrophic obstructive cardiomyopathy) ([place]) 06/09/2021  ? CVA (cerebral vascular accident) ([place]) 05/20/2021  ? Dyslipidemia   ? Dysphagia, post-stroke   ? Acute CVA (cerebrovascular accident) ([place]) 05/17/2021  ? Stroke ([place]) 05/16/2021  ? DMII (diabetes mellitus, type 2) ([place]) 05/16/2021  ? HTN (hypertension) 05/16/2021  ? HLD (hyperlipidemia) 05/16/2021  ? ? ?REFERRING DIAG: LT side weakness s/p CVA  ? ?THERAPY DIAG:  ?Muscle weakness (generalized) ? ?Other abnormalities of gait and mobility ? ?PERTINENT HISTORY: CVA  05/16/21  ? ?PRECAUTIONS: fall ? ?SUBJECTIVE: No new reports. Doing ok, no pain right now.  ? ?PAIN:  ?Are you having pain? No ? ? ? ?TODAY'S TREATMENT:  ?10/25/21 ?Heel raise x20  ?Sidestepping 5 RT in bars  ?Retro walking 5RT in bars  ?Step taps 6 inch x20  ?Step ups 6 inch 2 x 10 each  ?Palloff press RTB 3 x 10 each  ?Sit to stands 2 x 10 with UE assist  ?Nustep 3 minutes lv3  ? ?10/20/21 ?Standing march x20 each with 3lb ?Standing hip abduction 2 x 10 each with 3lb ?Standing hip extension 2 x 10 each with 3lb  ?Step ups 7 inch x 10 each HHA x 2 ? ? Strength                                                                                09/08/21 10/20/21  ?  Strength Assessment Site Hip;Knee;Ankle    ?  Right/Left Hip Right;Left    ?  Right Hip Flexion 4+/5  5/5  ?  Right Hip Extension 4+/5    ?  Right Hip ABduction 4+/5  4+/5  ?  Left Hip Flexion 4-/5  4+/5  ?  Left Hip Extension 3+/5    ?  Left Hip ABduction 3+/5  4/5  ?  Right/Left Knee Right;Left    ?  Right Knee Extension 4+/5  5/5  ?  Left Knee Extension 4-/5  4+/5  ?  Right/Left Ankle Right;Left    ?  Right Ankle Dorsiflexion 5/5  5/5  ?  Left Ankle Dorsiflexion 4+/5  5/5  ?  (Patient unable to get into prone for hip extension)  ?Ambulation/Gait   ?  Ambulation/Gait Yes   ?  Ambulation/Gait Assistance 5: Supervision   ?  Ambulation Distance (Feet) 180 feet (was 90)   ?  Assistive device Straight cane   ?  Gait Pattern Decreased stride length;Decreased arm swing - left;Decreased stance time - left   ?  Ambulation Surface Level;Indoor   ?  Gait Comments 2 MWT   ? ?5 x sit to stand: 22.3 sec with use of UEs ? ?10/13/21 ?Heel raises 2 x10 ?Standing march 3 x 10  ?Standing hip abduction/ extension 3 x 10 each with 2lb  ?Sit to stand from elevated seat 2 x 10  ?6 inch step taps 2 x10 HHA x 1 ?Sidestepping counter x 6 ft x 4 passes ?Tandem stance at counter 2 x 20 sec  ?Nustep 4 min EOS for strength and sequencing ? ?*CGA needed for safety with sit to stand,  sidestepping and tandem stance ? ? ?HOME EXERCISE PROGRAM: ?Eval: seated march, heel/ toe raise, LAQ  ?Access Code: [email or web address] ?URL: [email or web address] ?Date: 09/08/2021 ?Prepared by: Josue Hector ?Access Code: [email or web address] ?URL: [email or web address] ?Date: 10/11/2021 ?Prepared by: Josue Hector ? ?Exercises ?Heel Raises with Counter Support - 2 x daily - 7 x weekly - 3 sets - 10 reps ?Standing Hip Abduction with Counter Support - 2 x daily - 7 x weekly - 3 sets - 10 reps ?Standing Hip Extension with Counter Support - 2 x daily - 7 x weekly - 3 sets - 10 reps ?Standing March with Counter Support - 2 x daily - 7 x weekly - 3 sets - 10 reps ? ?Access Code: [email or web address] ?URL: [email or web address] ?Date: 10/13/2021 ?Prepared by: [facility] - Rehab ? ?Exercises ?Side Stepping with Counter Support - 2 x daily - 7 x weekly - 5 sets - 10 reps ?Standing Tandem Balance with Counter Support - 2 x daily - 7 x weekly - 3 sets - 1 reps - 20 sec hold ? ? ?  ? ? PT Short Term Goals - 09/08/21 1510   ? ?  ? PT SHORT TERM GOAL #1  ? Title Patient will be independent with initial HEP and self-management strategies to improve functional outcomes   ? Time 3   ? Period Weeks   ? Status MET  ? Target Date 09/29/21   ?  ? PT SHORT TERM GOAL #2  ? Title Patient will be able to perform stand x 5 in < 30 seconds to demonstrate improvement in functional mobility and reduced risk for falls.   ? Time 3   ? Period Weeks   ? Status MET   ? Target Date 09/29/21   ? ?  ?  ? ?  ? ? ? PT Long Term Goals - 09/08/21 1510   ? ?  ? PT LONG TERM GOAL #1  ? Title Patient will be able to perform stand x 5 in < 15 seconds to demonstrate  improvement in functional mobility and reduced risk for falls.   ? Time 6   ? Period Weeks   ? Status Ongoing (Current 22.3 sec with Ues)  ? Target Date 10/20/21   ?  ? PT LONG TERM GOAL #2  ? Title Patient will have equal to or > 4+/5 MMT throughout BLE to improve ability to perform  functional mobility, stair ambulation and ADLs.   ? Time 6   ? Period Weeks   ? Status Ongoing (partially met, see MMT)  ? Target Date 10/20/21   ?  ? PT LONG TERM GOAL #3  ? Title Patient will be able to ambulate at least 275 feet during 2MWT with LRAD to demonstrate improved ability to perform functional mobility and associated tasks.   ? Time 6   ? Period Weeks   ? Status Ongoing (Current 180 feet)   ? Target Date 10/20/21   ?  ? PT LONG TERM GOAL #4  ? Title Patient will be independent with advanced HEP and self-management strategies to improve functional outcomes   ? Time 6   ? Period Weeks   ? Status Ongoing   ? Target Date 10/20/21   ? ?  ?  ? ?  ? ?  ?  ?  Clinical Impression Statement Patient challenged with additions today. Added palloff pressing for core and balance. Patient noting increased LT side shakiness and demos effort to maintain balance. Resumed Nustep for strength and sequencing. Patient with LT leg spasticity impacting tolerance. Will continue to progress activity as able. Patient will continue to benefit from skilled therapy services to reduce remaining deficits and improve functional ability.  ?  ?  Personal Factors and Comorbidities Comorbidity 3+   ?  Comorbidities DM, HTN, CVA   ?  Examination-Activity Limitations Locomotion Level;Transfers;Stand;Stairs;Squat   ?  Examination-Participation Restrictions Community Activity;Laundry;Yard Work;Meal Prep;Cleaning   ?  Stability/Clinical Decision Making Evolving/Moderate complexity   ?  Rehab Potential Good   ?  PT Frequency 2x / week   ?  PT Duration 4 weeks   ?  PT Treatment/Interventions ADLs/Self Care Home Management;Aquatic Therapy;Biofeedback;DME Instruction;Gait training;Cryotherapy;Stair training;Electrical Stimulation;Functional mobility training;Iontophoresis 91m/ml Dexamethasone;Therapeutic activities;Orthotic Fit/Training;Energy conservation;Splinting;Taping;Vasopneumatic Device;Manual techniques;Therapeutic exercise;Moist  Heat;Traction;Balance training;Manual lymph drainage;Vestibular;Contrast Bath;Parrafin;Ultrasound;Neuromuscular re-education;Compression bandaging;Scar mobilization;Visual/perceptual remediation/compensation;Cognitive remediation;Patien

## 2021-10-27 ENCOUNTER — Encounter (HOSPITAL_COMMUNITY): Payer: Medicare HMO | Admitting: Physical Therapy

## 2021-11-01 ENCOUNTER — Other Ambulatory Visit: Payer: Self-pay | Admitting: Nurse Practitioner

## 2021-11-01 ENCOUNTER — Other Ambulatory Visit: Payer: Self-pay | Admitting: Physical Medicine and Rehabilitation

## 2021-11-01 ENCOUNTER — Ambulatory Visit (HOSPITAL_COMMUNITY): Payer: Medicare HMO | Attending: Physical Medicine & Rehabilitation | Admitting: Physical Therapy

## 2021-11-01 DIAGNOSIS — I10 Essential (primary) hypertension: Secondary | ICD-10-CM

## 2021-11-01 DIAGNOSIS — N400 Enlarged prostate without lower urinary tract symptoms: Secondary | ICD-10-CM

## 2021-11-01 DIAGNOSIS — R2689 Other abnormalities of gait and mobility: Secondary | ICD-10-CM | POA: Insufficient documentation

## 2021-11-01 DIAGNOSIS — M6281 Muscle weakness (generalized): Secondary | ICD-10-CM | POA: Insufficient documentation

## 2021-11-01 DIAGNOSIS — E118 Type 2 diabetes mellitus with unspecified complications: Secondary | ICD-10-CM

## 2021-11-01 NOTE — Therapy (Signed)
?OUTPATIENT PHYSICAL THERAPY TREATMENT NOTE ? ? ?Patient Name: Brandon French ?MRN: [id number] ?DOB:1965-12-17, 56 y.o., male ?Today's Date: 10/13/2021 ? ?PCP: Renee Rival, FNP ?REFERRING PROVIDER: Charlett Blake MD ? ? PT End of Session - 11/01/21 1135   ? ? Visit Number 10   progress note completed visit #8  ? Number of Visits 16   ? Date for PT Re-Evaluation 11/17/21   ? Authorization Type [employer]   ? Authorization Time Period 8 approved 3/23-4/20/23   ? Authorization - Visit Number 2   ? Authorization - Number of Visits 8   ? Progress Note Due on Visit 18   ? PT Start Time 1133   ? PT Stop Time 1212   ? PT Time Calculation (min) 39 min   ? Activity Tolerance Patient tolerated treatment well   ? Behavior During Therapy [facility] for tasks assessed/performed   ? ?  ?  ? ?  ? ? ? ? ?Past Medical History:  ?Diagnosis Date  ? Chronic kidney disease   ? Diabetes mellitus ([place])   ? Elevated cholesterol   ? High blood pressure   ? ?Past Surgical History:  ?Procedure Laterality Date  ? TRANSURETHRAL RESECTION OF PROSTATE  2020  ? ?Patient Active Problem List  ? Diagnosis Date Noted  ? Controlled type 2 diabetes mellitus with complication, without long-term current use of insulin ([place]) 08/25/2021  ? CKD (chronic kidney disease) stage 3, GFR 30-59 ml/min (HCC) 08/25/2021  ? Benign prostatic hyperplasia without lower urinary tract symptoms 08/25/2021  ? Insomnia 08/25/2021  ? Screening for colon cancer 08/25/2021  ? HOCM (hypertrophic obstructive cardiomyopathy) ([place]) 06/09/2021  ? CVA (cerebral vascular accident) ([place]) 05/20/2021  ? Dyslipidemia   ? Dysphagia, post-stroke   ? Acute CVA (cerebrovascular accident) ([place]) 05/17/2021  ? Stroke ([place]) 05/16/2021  ? DMII (diabetes mellitus, type 2) ([place]) 05/16/2021  ? HTN (hypertension) 05/16/2021  ? HLD (hyperlipidemia) 05/16/2021  ? ? ?REFERRING DIAG: LT side weakness s/p CVA  ? ?THERAPY DIAG:  ?Muscle weakness (generalized) ? ?Other abnormalities of gait and  mobility ? ?PERTINENT HISTORY: CVA 05/16/21  ? ?PRECAUTIONS: fall ? ?SUBJECTIVE: No new reports. Doing ok, no pain right now.  ? ?PAIN:  ?Are you having pain? No ? ? ? ?TODAY'S TREATMENT:  ? ?11/01/21 ?Heel raise on incline 20X  ?Toe raises on incline 20X ?Sidestepping 2 RT on blue line no AD  ?Retro walking 2RT on blue line no AD  ?Tandem walking 2RT on blue line no AD, min A ?Step taps 6 inch x20 intermittent UE assist of Rt UE (mostly Rt tap) ?Step ups 6 inch 2 x 10 each 1 UE assist ?Sit to stands 2 x 10 with UE assist  ? ?10/25/21 ?Heel raise x20  ?Sidestepping 5 RT in bars  ?Retro walking 5RT in bars  ?Step taps 6 inch x20  ?Step ups 6 inch 2 x 10 each  ?Palloff press RTB 3 x 10 each  ?Sit to stands 2 x 10 with UE assist  ?Nustep 3 minutes lv3  ? ?10/20/21 ?Standing march x20 each with 3lb ?Standing hip abduction 2 x 10 each with 3lb ?Standing hip extension 2 x 10 each with 3lb  ?Step ups 7 inch x 10 each HHA x 2 ?REASSESSMENT MEASURES; SEE NOTE FROM THIS DATE ? ?10/13/21 ?Heel raises 2 x10 ?Standing march 3 x 10  ?Standing hip abduction/ extension 3 x 10 each with 2lb  ?Sit to stand from elevated seat  2 x 10  ?6 inch step taps 2 x10 HHA x 1 ?Sidestepping counter x 6 ft x 4 passes ?Tandem stance at counter 2 x 20 sec  ?Nustep 4 min EOS for strength and sequencing ? ?*CGA needed for safety with sit to stand, sidestepping and tandem stance ? ? ?HOME EXERCISE PROGRAM: ?Eval: seated march, heel/ toe raise, LAQ  ?Access Code: [email or web address] ?URL: [email or web address] ?Date: 09/08/2021 ?Prepared by: Josue Hector ?Access Code: [email or web address] ?URL: [email or web address] ?Date: 10/11/2021 ?Prepared by: Josue Hector ? ?Exercises ?Heel Raises with Counter Support - 2 x daily - 7 x weekly - 3 sets - 10 reps ?Standing Hip Abduction with Counter Support - 2 x daily - 7 x weekly - 3 sets - 10 reps ?Standing Hip Extension with Counter Support - 2 x daily - 7 x weekly - 3 sets - 10 reps ?Standing March with  Counter Support - 2 x daily - 7 x weekly - 3 sets - 10 reps ? ?Access Code: [email or web address] ?URL: [email or web address] ?Date: 10/13/2021 ?Prepared by: [facility] - Rehab ? ?Exercises ?Side Stepping with Counter Support - 2 x daily - 7 x weekly - 5 sets - 10 reps ?Standing Tandem Balance with Counter Support - 2 x daily - 7 x weekly - 3 sets - 1 reps - 20 sec hold ? ? ?  ? ? PT Short Term Goals - 09/08/21 1510   ? ?  ? PT SHORT TERM GOAL #1  ? Title Patient will be independent with initial HEP and self-management strategies to improve functional outcomes   ? Time 3   ? Period Weeks   ? Status MET  ? Target Date 09/29/21   ?  ? PT SHORT TERM GOAL #2  ? Title Patient will be able to perform stand x 5 in < 30 seconds to demonstrate improvement in functional mobility and reduced risk for falls.   ? Time 3   ? Period Weeks   ? Status MET   ? Target Date 09/29/21   ? ?  ?  ? ?  ? ? ? PT Long Term Goals - 09/08/21 1510   ? ?  ? PT LONG TERM GOAL #1  ? Title Patient will be able to perform stand x 5 in < 15 seconds to demonstrate improvement in functional mobility and reduced risk for falls.   ? Time 6   ? Period Weeks   ? Status Ongoing (Current 22.3 sec with Ues)  ? Target Date 10/20/21   ?  ? PT LONG TERM GOAL #2  ? Title Patient will have equal to or > 4+/5 MMT throughout BLE to improve ability to perform functional mobility, stair ambulation and ADLs.   ? Time 6   ? Period Weeks   ? Status Ongoing (partially met, see MMT)  ? Target Date 10/20/21   ?  ? PT LONG TERM GOAL #3  ? Title Patient will be able to ambulate at least 275 feet during 2MWT with LRAD to demonstrate improved ability to perform functional mobility and associated tasks.   ? Time 6   ? Period Weeks   ? Status Ongoing (Current 180 feet)   ? Target Date 10/20/21   ?  ? PT LONG TERM GOAL #4  ? Title Patient will be independent with advanced HEP and self-management strategies to improve functional outcomes   ? Time 6   ? Period Weeks   ? Status Ongoing   ?  Target Date 10/20/21   ? ?  ?  ? ?  ? ?  ?  ?  Clinical Impression Statement Transitioned dynamic balance actvities outside of parallel bars with ability to complete sidestepping and retro with CGA.  Added tandem with noted challenge, several LOB with min assist required to recover.  PT with frequent cues to attend to task, keep forward gaze rather than looking down to improve stability.  Reduced UE assist where able.  Pt required several rest breaks during session today due to fatigue. Cues to control descent with sit to stand activity and noted fatigue following 2 sets.   Patient will continue to benefit from skilled therapy services to reduce remaining deficits and improve functional ability.  ?  ?  Personal Factors and Comorbidities Comorbidity 3+   ?  Comorbidities DM, HTN, CVA   ?  Examination-Activity Limitations Locomotion Level;Transfers;Stand;Stairs;Squat   ?  Examination-Participation Restrictions Community Activity;Laundry;Yard Work;Meal Prep;Cleaning   ?  Stability/Clinical Decision Making Evolving/Moderate complexity   ?  Rehab Potential Good   ?  PT Frequency 2x / week   ?  PT Duration 4 weeks   ?  PT Treatment/Interventions ADLs/Self Care Home Management;Aquatic Therapy;Biofeedback;DME Instruction;Gait training;Cryotherapy;Stair training;Electrical Stimulation;Functional mobility training;Iontophoresis 96m/ml Dexamethasone;Therapeutic activities;Orthotic Fit/Training;Energy conservation;Splinting;Taping;Vasopneumatic Device;Manual techniques;Therapeutic exercise;Moist Heat;Traction;Balance training;Manual lymph drainage;Vestibular;Contrast Bath;Parrafin;Ultrasound;Neuromuscular re-education;Compression bandaging;Scar mobilization;Visual/perceptual remediation/compensation;Cognitive remediation;Patient/family education;Passive range of motion;Dry needling;Spinal Manipulations;Joint Manipulations   ?  PT Next Visit Plan Progress LE strength and balance exercise as able.    ?     ?     ?  ?   ? ?12:06  PM, 11/01/21 ?Amy BSula Soda PTA/CLT, WTA ?[phone]?   ?

## 2021-11-03 ENCOUNTER — Ambulatory Visit (HOSPITAL_COMMUNITY): Payer: Medicare HMO | Admitting: Physical Therapy

## 2021-11-03 ENCOUNTER — Encounter (HOSPITAL_COMMUNITY): Payer: Self-pay | Admitting: Physical Therapy

## 2021-11-03 DIAGNOSIS — M6281 Muscle weakness (generalized): Secondary | ICD-10-CM | POA: Diagnosis not present

## 2021-11-03 DIAGNOSIS — R2689 Other abnormalities of gait and mobility: Secondary | ICD-10-CM

## 2021-11-03 NOTE — Therapy (Signed)
?OUTPATIENT PHYSICAL THERAPY TREATMENT NOTE ? ? ?Patient Name: Brandon French ?MRN: [id number] ?DOB:May 25, 1966, 56 y.o., male ?Today's Date: 10/13/2021 ? ?PCP: Renee Rival, FNP ?REFERRING PROVIDER: Charlett Blake MD ? ? PT End of Session - 11/03/21 1324   ? ? Visit Number 12   ? Number of Visits 16   ? Date for PT Re-Evaluation 11/17/21   ? Authorization Type [employer]   ? Authorization Time Period 8 approved 3/23-4/20/23   ? Authorization - Visit Number 3   ? Authorization - Number of Visits 8   ? Progress Note Due on Visit 18   ? PT Start Time 1320   ? PT Stop Time 1400   ? PT Time Calculation (min) 40 min   ? Activity Tolerance Patient tolerated treatment well   ? Behavior During Therapy [facility] for tasks assessed/performed   ? ?  ?  ? ?  ? ? ? ? ?Past Medical History:  ?Diagnosis Date  ? Chronic kidney disease   ? Diabetes mellitus ([place])   ? Elevated cholesterol   ? High blood pressure   ? ?Past Surgical History:  ?Procedure Laterality Date  ? TRANSURETHRAL RESECTION OF PROSTATE  2020  ? ?Patient Active Problem List  ? Diagnosis Date Noted  ? Controlled type 2 diabetes mellitus with complication, without long-term current use of insulin ([place]) 08/25/2021  ? CKD (chronic kidney disease) stage 3, GFR 30-59 ml/min (HCC) 08/25/2021  ? Benign prostatic hyperplasia without lower urinary tract symptoms 08/25/2021  ? Insomnia 08/25/2021  ? Screening for colon cancer 08/25/2021  ? HOCM (hypertrophic obstructive cardiomyopathy) ([place]) 06/09/2021  ? CVA (cerebral vascular accident) ([place]) 05/20/2021  ? Dyslipidemia   ? Dysphagia, post-stroke   ? Acute CVA (cerebrovascular accident) ([place]) 05/17/2021  ? Stroke ([place]) 05/16/2021  ? DMII (diabetes mellitus, type 2) ([place]) 05/16/2021  ? HTN (hypertension) 05/16/2021  ? HLD (hyperlipidemia) 05/16/2021  ? ? ?REFERRING DIAG: LT side weakness s/p CVA  ? ?THERAPY DIAG:  ?Muscle weakness (generalized) ? ?Other abnormalities of gait and mobility ? ?PERTINENT HISTORY: CVA  05/16/21  ? ?PRECAUTIONS: fall ? ?SUBJECTIVE: Pt reports no issues currently.  Reports compliance with HEP 3X a day at home. ? ?PAIN:  ?Are you having pain? No ? ? ? ?TODAY'S TREATMENT:  ? ?11/03/21 ?Heel raise on incline 20X  ?Toe raises on incline 20X ?Tandem stance 30" each LE lead ?Sidestepping 2 RT on blue line no AD  ?Retro walking 2RT on blue line no AD  ?Tandem walking 2RT on blue line no AD, min A ?Step taps 6 inch x20 intermittent UE assist of Rt UE  ?Step ups 6 inch 2 x 10 each 1 UE assist Lt LE, intermittent bil UE with Rt LE ?Sit to stands X10 without UE assist  ?Palloff press RTB 3 x 10 each  ? ? ?11/01/21 ?Heel raise on incline 20X  ?Toe raises on incline 20X ?Sidestepping 2 RT on blue line no AD  ?Retro walking 2RT on blue line no AD  ?Tandem walking 2RT on blue line no AD, min A ?Step taps 6 inch x20 intermittent UE assist of Rt UE (mostly Rt tap) ?Step ups 6 inch 2 x 10 each 1 UE assist  ?Sit to stands 2 x 10 with UE assist  ? ?10/25/21 ?Heel raise x20  ?Sidestepping 5 RT in bars  ?Retro walking 5RT in bars  ?Step taps 6 inch x20  ?Step ups 6 inch 2 x 10 each  ?Palloff  press RTB 3 x 10 each  ?Sit to stands 2 x 10 with UE assist  ?Nustep 3 minutes lv3  ? ?10/20/21 ?Standing march x20 each with 3lb ?Standing hip abduction 2 x 10 each with 3lb ?Standing hip extension 2 x 10 each with 3lb  ?Step ups 7 inch x 10 each HHA x 2 ?REASSESSMENT MEASURES; SEE NOTE FROM THIS DATE ? ?10/13/21 ?Heel raises 2 x10 ?Standing march 3 x 10  ?Standing hip abduction/ extension 3 x 10 each with 2lb  ?Sit to stand from elevated seat 2 x 10  ?6 inch step taps 2 x10 HHA x 1 ?Sidestepping counter x 6 ft x 4 passes ?Tandem stance at counter 2 x 20 sec  ?Nustep 4 min EOS for strength and sequencing ? ?*CGA needed for safety with sit to stand, sidestepping and tandem stance ? ? ?HOME EXERCISE PROGRAM: ?Eval: seated march, heel/ toe raise, LAQ  ?Access Code: [email or web address] ?URL: [email or web address] ?Date: 09/08/2021 ?Prepared  by: Josue Hector ?Access Code: [email or web address] ?URL: [email or web address] ?Date: 10/11/2021 ?Prepared by: Josue Hector ? ?Exercises ?Heel Raises with Counter Support - 2 x daily - 7 x weekly - 3 sets - 10 reps ?Standing Hip Abduction with Counter Support - 2 x daily - 7 x weekly - 3 sets - 10 reps ?Standing Hip Extension with Counter Support - 2 x daily - 7 x weekly - 3 sets - 10 reps ?Standing March with Counter Support - 2 x daily - 7 x weekly - 3 sets - 10 reps ? ?Access Code: [email or web address] ?URL: [email or web address] ?Date: 10/13/2021 ?Prepared by: [facility] - Rehab ? ?Exercises ?Side Stepping with Counter Support - 2 x daily - 7 x weekly - 5 sets - 10 reps ?Standing Tandem Balance with Counter Support - 2 x daily - 7 x weekly - 3 sets - 1 reps - 20 sec hold ? ? ?  ? ? PT Short Term Goals - 09/08/21 1510   ? ?  ? PT SHORT TERM GOAL #1  ? Title Patient will be independent with initial HEP and self-management strategies to improve functional outcomes   ? Time 3   ? Period Weeks   ? Status MET  ? Target Date 09/29/21   ?  ? PT SHORT TERM GOAL #2  ? Title Patient will be able to perform stand x 5 in < 30 seconds to demonstrate improvement in functional mobility and reduced risk for falls.   ? Time 3   ? Period Weeks   ? Status MET   ? Target Date 09/29/21   ? ?  ?  ? ?  ? ? ? PT Long Term Goals - 09/08/21 1510   ? ?  ? PT LONG TERM GOAL #1  ? Title Patient will be able to perform stand x 5 in < 15 seconds to demonstrate improvement in functional mobility and reduced risk for falls.   ? Time 6   ? Period Weeks   ? Status Ongoing (Current 22.3 sec with Ues)  ? Target Date 10/20/21   ?  ? PT LONG TERM GOAL #2  ? Title Patient will have equal to or > 4+/5 MMT throughout BLE to improve ability to perform functional mobility, stair ambulation and ADLs.   ? Time 6   ? Period Weeks   ? Status Ongoing (partially met, see MMT)  ? Target Date 10/20/21   ?  ? PT LONG TERM GOAL #3  ? Title Patient will be able to  ambulate at least 275 feet during 2MWT with LRAD to demonstrate improved ability to perform functional mobility and associated tasks.   ? Time 6   ? Period Weeks   ? Status Ongoing (Current 180 feet)   ? Target Date 10/20/21   ?  ? PT LONG TERM GOAL #4  ? Title Patient will be independent with advanced HEP and self-management strategies to improve functional outcomes   ? Time 6   ? Period Weeks   ? Status Ongoing   ? Target Date 10/20/21   ? ?  ?  ? ?  ? ?  ?  ?  Clinical Impression Statement Continued with LE strengthening and balance activities.  Difficulty completing LE taps without UE assist as with frequent LOB.  Continued to reduce UE assist when able.  PT with frequent cues to attend to task, keep forward gaze rather than looking down to improve stability.   More difficulty and instability with Rt LE leading step ups rather than Lt with 1 UE assist. Pt also tends to hyperextend Rt knee when stepping up.   Pt required several rest breaks during session today due to fatigue. Patient will continue to benefit from skilled therapy services to reduce remaining deficits and improve functional ability.  ?  ?  Personal Factors and Comorbidities Comorbidity 3+   ?  Comorbidities DM, HTN, CVA   ?  Examination-Activity Limitations Locomotion Level;Transfers;Stand;Stairs;Squat   ?  Examination-Participation Restrictions Community Activity;Laundry;Yard Work;Meal Prep;Cleaning   ?  Stability/Clinical Decision Making Evolving/Moderate complexity   ?  Rehab Potential Good   ?  PT Frequency 2x / week   ?  PT Duration 4 weeks   ?  PT Treatment/Interventions ADLs/Self Care Home Management;Aquatic Therapy;Biofeedback;DME Instruction;Gait training;Cryotherapy;Stair training;Electrical Stimulation;Functional mobility training;Iontophoresis 69m/ml Dexamethasone;Therapeutic activities;Orthotic Fit/Training;Energy conservation;Splinting;Taping;Vasopneumatic Device;Manual techniques;Therapeutic exercise;Moist Heat;Traction;Balance  training;Manual lymph drainage;Vestibular;Contrast Bath;Parrafin;Ultrasound;Neuromuscular re-education;Compression bandaging;Scar mobilization;Visual/perceptual remediation/compensation;Cognitive remediatio

## 2021-11-08 ENCOUNTER — Encounter (HOSPITAL_COMMUNITY): Payer: Medicare HMO | Admitting: Physical Therapy

## 2021-11-09 ENCOUNTER — Encounter (HOSPITAL_COMMUNITY): Payer: Medicare HMO

## 2021-11-10 ENCOUNTER — Ambulatory Visit (HOSPITAL_COMMUNITY): Payer: Medicare HMO

## 2021-11-10 ENCOUNTER — Encounter (HOSPITAL_COMMUNITY): Payer: Self-pay

## 2021-11-10 DIAGNOSIS — M6281 Muscle weakness (generalized): Secondary | ICD-10-CM | POA: Diagnosis not present

## 2021-11-10 DIAGNOSIS — R2689 Other abnormalities of gait and mobility: Secondary | ICD-10-CM | POA: Diagnosis not present

## 2021-11-10 NOTE — Therapy (Signed)
?OUTPATIENT PHYSICAL THERAPY TREATMENT NOTE ? ? ?Patient Name: Brandon French ?MRN: [id number] ?DOB:08-09-1965, 56 y.o., male ?Today's Date: 10/13/2021 ? ?PCP: Renee Rival, FNP ?REFERRING PROVIDER: Charlett Blake MD ? ? PT End of Session - 11/10/21 1335   ? ? Visit Number 13   ? Number of Visits 16   ? Date for PT Re-Evaluation 11/17/21   ? Authorization Type [employer]   ? Authorization Time Period 8 approved 3/23-4/20/23   ? Authorization - Visit Number 4   ? Authorization - Number of Visits 8   ? Progress Note Due on Visit 18   ? PT Start Time 1334   ? PT Stop Time 3244   ? PT Time Calculation (min) 23 min   ? ?  ?  ? ?  ? ? ? ? ? ?Past Medical History:  ?Diagnosis Date  ? Chronic kidney disease   ? Diabetes mellitus ([place])   ? Elevated cholesterol   ? High blood pressure   ? ?Past Surgical History:  ?Procedure Laterality Date  ? TRANSURETHRAL RESECTION OF PROSTATE  2020  ? ?Patient Active Problem List  ? Diagnosis Date Noted  ? Controlled type 2 diabetes mellitus with complication, without long-term current use of insulin ([place]) 08/25/2021  ? CKD (chronic kidney disease) stage 3, GFR 30-59 ml/min ([facility]) 08/25/2021  ? Benign prostatic hyperplasia without lower urinary tract symptoms 08/25/2021  ? Insomnia 08/25/2021  ? Screening for colon cancer 08/25/2021  ? HOCM (hypertrophic obstructive cardiomyopathy) ([place]) 06/09/2021  ? CVA (cerebral vascular accident) ([place]) 05/20/2021  ? Dyslipidemia   ? Dysphagia, post-stroke   ? Acute CVA (cerebrovascular accident) ([place]) 05/17/2021  ? Stroke ([place]) 05/16/2021  ? DMII (diabetes mellitus, type 2) ([place]) 05/16/2021  ? HTN (hypertension) 05/16/2021  ? HLD (hyperlipidemia) 05/16/2021  ? ? ?REFERRING DIAG: LT side weakness s/p CVA  ? ?THERAPY DIAG:  ?No diagnosis found. ? ?PERTINENT HISTORY: CVA 05/16/21  ? ?PRECAUTIONS: fall ? ?SUBJECTIVE: No pain or recent falls, continues to feel weak Lt LE ? ?PAIN:  ?Are you having pain? No ? ? ? ?TODAY'S TREATMENT:   ? 11/10/21 ? Step taps 6 inch x20 intermittent UE assist of Rt UE  ? Tandem stance with 1 foot on 6in step ?  Last set 10 UE flexion ? SLS with 1 finger aid for Lt x 30", Rt SLS 17" ? Squat 5 reps front of chair, cueing for mechaics ? STS controlled no HHA 10x ? Sidestep and retro gait with GTB around thigh no AD ? ? ?11/03/21 ?Heel raise on incline 20X  ?Toe raises on incline 20X ?Tandem stance 30" each LE lead ?Sidestepping 2 RT on blue line no AD  ?Retro walking 2RT on blue line no AD  ?Tandem walking 2RT on blue line no AD, min A ?Step taps 6 inch x20 intermittent UE assist of Rt UE  ?Step ups 6 inch 2 x 10 each 1 UE assist Lt LE, intermittent bil UE with Rt LE ?Sit to stands X10 without UE assist  ?Palloff press RTB 3 x 10 each  ? ? ?11/01/21 ?Heel raise on incline 20X  ?Toe raises on incline 20X ?Sidestepping 2 RT on blue line no AD  ?Retro walking 2RT on blue line no AD  ?Tandem walking 2RT on blue line no AD, min A ?Step taps 6 inch x20 intermittent UE assist of Rt UE (mostly Rt tap) ?Step ups 6 inch 2 x 10 each 1 UE assist  ?Sit  to stands 2 x 10 with UE assist  ? ?10/25/21 ?Heel raise x20  ?Sidestepping 5 RT in bars  ?Retro walking 5RT in bars  ?Step taps 6 inch x20  ?Step ups 6 inch 2 x 10 each  ?Palloff press RTB 3 x 10 each  ?Sit to stands 2 x 10 with UE assist  ?Nustep 3 minutes lv3  ? ?10/20/21 ?Standing march x20 each with 3lb ?Standing hip abduction 2 x 10 each with 3lb ?Standing hip extension 2 x 10 each with 3lb  ?Step ups 7 inch x 10 each HHA x 2 ?REASSESSMENT MEASURES; SEE NOTE FROM THIS DATE ? ?10/13/21 ?Heel raises 2 x10 ?Standing march 3 x 10  ?Standing hip abduction/ extension 3 x 10 each with 2lb  ?Sit to stand from elevated seat 2 x 10  ?6 inch step taps 2 x10 HHA x 1 ?Sidestepping counter x 6 ft x 4 passes ?Tandem stance at counter 2 x 20 sec  ?Nustep 4 min EOS for strength and sequencing ? ?*CGA needed for safety with sit to stand, sidestepping and tandem stance ? ? ?HOME EXERCISE  PROGRAM: ?Eval: seated march, heel/ toe raise, LAQ  ?Access Code: [email or web address] ?URL: [email or web address] ?Date: 09/08/2021 ?Prepared by: Josue Hector ?Access Code: [email or web address] ?URL: [email or web address] ?Date: 10/11/2021 ?Prepared by: Josue Hector ? ?Exercises ?Heel Raises with Counter Support - 2 x daily - 7 x weekly - 3 sets - 10 reps ?Standing Hip Abduction with Counter Support - 2 x daily - 7 x weekly - 3 sets - 10 reps ?Standing Hip Extension with Counter Support - 2 x daily - 7 x weekly - 3 sets - 10 reps ?Standing March with Counter Support - 2 x daily - 7 x weekly - 3 sets - 10 reps ? ?Access Code: [email or web address] ?URL: [email or web address] ?Date: 10/13/2021 ?Prepared by: [facility] ? ?Exercises ?Side Stepping with Counter Support - 2 x daily - 7 x weekly - 5 sets - 10 reps ?Standing Tandem Balance with Counter Support - 2 x daily - 7 x weekly - 3 sets - 1 reps - 20 sec hold ? ? ?  ? ? PT Short Term Goals - 09/08/21 1510   ? ?  ? PT SHORT TERM GOAL #1  ? Title Patient will be independent with initial HEP and self-management strategies to improve functional outcomes   ? Time 3   ? Period Weeks   ? Status MET  ? Target Date 09/29/21   ?  ? PT SHORT TERM GOAL #2  ? Title Patient will be able to perform stand x 5 in < 30 seconds to demonstrate improvement in functional mobility and reduced risk for falls.   ? Time 3   ? Period Weeks   ? Status MET   ? Target Date 09/29/21   ? ?  ?  ? ?  ? ? ? PT Long Term Goals - 09/08/21 1510   ? ?  ? PT LONG TERM GOAL #1  ? Title Patient will be able to perform stand x 5 in < 15 seconds to demonstrate improvement in functional mobility and reduced risk for falls.   ? Time 6   ? Period Weeks   ? Status Ongoing (Current 22.3 sec with Ues)  ? Target Date 10/20/21   ?  ? PT LONG TERM GOAL #2  ? Title Patient will have equal to or > 4+/5 MMT throughout BLE to improve ability to perform functional mobility, stair ambulation and ADLs.   ?  Time 6    ? Period Weeks   ? Status Ongoing (partially met, see MMT)  ? Target Date 10/20/21   ?  ? PT LONG TERM GOAL #3  ? Title Patient will be able to ambulate at least 275 feet during 2MWT with LRAD to demonstrate improved ability to perform functional mobility and associated tasks.   ? Time 6   ? Period Weeks   ? Status Ongoing (Current 180 feet)   ? Target Date 10/20/21   ?  ? PT LONG TERM GOAL #4  ? Title Patient will be independent with advanced HEP and self-management strategies to improve functional outcomes   ? Time 6   ? Period Weeks   ? Status Ongoing   ? Target Date 10/20/21   ? ?  ?  ? ?  ? ?  ?  ?  Clinical Impression Statement Pt late for apt.  Session focus with balance training.  Pt continues to require HHA with balance associated activities especially Lt LE weight bearing.  Added squats for gluteal strengthening with multimodal cueing required for mechanics. ?  ?  Personal Factors and Comorbidities Comorbidity 3+   ?  Comorbidities DM, HTN, CVA   ?  Examination-Activity Limitations Locomotion Level;Transfers;Stand;Stairs;Squat   ?  Examination-Participation Restrictions Community Activity;Laundry;Yard Work;Meal Prep;Cleaning   ?  Stability/Clinical Decision Making Evolving/Moderate complexity   ?  Rehab Potential Good   ?  PT Frequency 2x / week   ?  PT Duration 4 weeks   ?  PT Treatment/Interventions ADLs/Self Care Home Management;Aquatic Therapy;Biofeedback;DME Instruction;Gait training;Cryotherapy;Stair training;Electrical Stimulation;Functional mobility training;Iontophoresis 23m/ml Dexamethasone;Therapeutic activities;Orthotic Fit/Training;Energy conservation;Splinting;Taping;Vasopneumatic Device;Manual techniques;Therapeutic exercise;Moist Heat;Traction;Balance training;Manual lymph drainage;Vestibular;Contrast Bath;Parrafin;Ultrasound;Neuromuscular re-education;Compression bandaging;Scar mobilization;Visual/perceptual remediation/compensation;Cognitive remediation;Patient/family education;Passive  range of motion;Dry needling;Spinal Manipulations;Joint Manipulations   ?  PT Next Visit Plan Progress LE strength and balance exercise as able.    ?  ?CIhor Austin LPTA/CLT; CBIS ?[phone]? ?3:11 PM, 11/10/21 ? ?

## 2021-11-11 ENCOUNTER — Encounter: Payer: Self-pay | Admitting: Gastroenterology

## 2021-11-11 ENCOUNTER — Ambulatory Visit: Payer: Medicare HMO | Admitting: Gastroenterology

## 2021-11-15 ENCOUNTER — Ambulatory Visit (HOSPITAL_COMMUNITY): Payer: Medicare HMO | Admitting: Physical Therapy

## 2021-11-15 DIAGNOSIS — M6281 Muscle weakness (generalized): Secondary | ICD-10-CM

## 2021-11-15 DIAGNOSIS — R2689 Other abnormalities of gait and mobility: Secondary | ICD-10-CM

## 2021-11-15 NOTE — Therapy (Signed)
?OUTPATIENT PHYSICAL THERAPY TREATMENT NOTE ? ? ?Patient Name: Brandon French ?MRN: [id number] ?DOB:[birth year], 56 y.o., male ?Today's Date: 10/13/2021 ? ?PCP: Renee Rival, FNP ?REFERRING PROVIDER: Charlett Blake MD ? ? PT End of Session - 11/15/21 1323   ? ? Visit Number 14   ? Number of Visits 16   ? Date for PT Re-Evaluation 11/17/21   ? Authorization Type [employer]   ? Authorization Time Period 8 approved 3/23-4/20/23   ? Authorization - Visit Number 5   ? Authorization - Number of Visits 8   ? Progress Note Due on Visit 18   ? PT Start Time 1320   ? PT Stop Time 1400   ? PT Time Calculation (min) 40 min   ? Activity Tolerance Patient tolerated treatment well   ? Behavior During Therapy [facility] for tasks assessed/performed   ? ?  ?  ? ?  ? ? ? ? ? ?Past Medical History:  ?Diagnosis Date  ? Chronic kidney disease   ? Diabetes mellitus ([place])   ? Elevated cholesterol   ? High blood pressure   ? ?Past Surgical History:  ?Procedure Laterality Date  ? TRANSURETHRAL RESECTION OF PROSTATE  2020  ? ?Patient Active Problem List  ? Diagnosis Date Noted  ? Controlled type 2 diabetes mellitus with complication, without long-term current use of insulin ([place]) 08/25/2021  ? CKD (chronic kidney disease) stage 3, GFR 30-59 ml/min (HCC) 08/25/2021  ? Benign prostatic hyperplasia without lower urinary tract symptoms 08/25/2021  ? Insomnia 08/25/2021  ? Screening for colon cancer 08/25/2021  ? HOCM (hypertrophic obstructive cardiomyopathy) ([place]) 06/09/2021  ? CVA (cerebral vascular accident) ([place]) 05/20/2021  ? Dyslipidemia   ? Dysphagia, post-stroke   ? Acute CVA (cerebrovascular accident) ([place]) 05/17/2021  ? Stroke ([place]) 05/16/2021  ? DMII (diabetes mellitus, type 2) ([place]) 05/16/2021  ? HTN (hypertension) 05/16/2021  ? HLD (hyperlipidemia) 05/16/2021  ? ? ?REFERRING DIAG: LT side weakness s/p CVA  ? ?THERAPY DIAG:  ?Muscle weakness (generalized) ? ?Other abnormalities of gait and mobility ? ?PERTINENT HISTORY: CVA  05/16/21  ? ?PRECAUTIONS: fall ? ?SUBJECTIVE: Pt states he feels much stronger since beginning therapy.  Reports 80-90% overall improvement.  No pain or issues today. ? ?PAIN:  ?Are you having pain? No ? ? ? ?TODAY'S TREATMENT:  ?  ?11/15/21 ? Heelraises on incline 20X warm up (cues to hold and lower slowly) ?Step taps 6 inch x20 intermittent UE assist of Rt UE  ? Tandem stance 2X30" each on blue foam beam ?Lunges onto 4" step no UE 2X 10X each ?Vectors 5X5" with 1 UE assist ?Step taps 6 inch x20 intermittent UE assist of Rt UE  ?Step ups 6 inch 2 x 10 each 1 UE assist Lt LE, intermittent bil UE with Rt LE; with opp LE power up ? STS controlled no HHA 10x ?  ?11/10/21 ? Step taps 6 inch x20 intermittent UE assist of Rt UE  ? Tandem stance with 1 foot on 6in step ?  Last set 10 UE flexion ? SLS with 1 finger aid for Lt x 30", Rt SLS 17" ? Squat 5 reps front of chair, cueing for mechaics ? STS controlled no HHA 10x ? Sidestep and retro gait with GTB around thigh no AD ? ? ?11/03/21 ?Heel raise on incline 20X  ?Toe raises on incline 20X ?Tandem stance 30" each LE lead ?Sidestepping 2 RT on blue line no AD  ?Retro walking 2RT on blue  line no AD  ?Tandem walking 2RT on blue line no AD, min A ?Step taps 6 inch x20 intermittent UE assist of Rt UE  ?Step ups 6 inch 2 x 10 each 1 UE assist Lt LE, intermittent bil UE with Rt LE ?Sit to stands X10 without UE assist  ?Palloff press RTB 3 x 10 each  ? ? ?11/01/21 ?Heel raise on incline 20X  ?Toe raises on incline 20X ?Sidestepping 2 RT on blue line no AD  ?Retro walking 2RT on blue line no AD  ?Tandem walking 2RT on blue line no AD, min A ?Step taps 6 inch x20 intermittent UE assist of Rt UE (mostly Rt tap) ?Step ups 6 inch 2 x 10 each 1 UE assist  ?Sit to stands 2 x 10 with UE assist  ? ?10/25/21 ?Heel raise x20  ?Sidestepping 5 RT in bars  ?Retro walking 5RT in bars  ?Step taps 6 inch x20  ?Step ups 6 inch 2 x 10 each  ?Palloff press RTB 3 x 10 each  ?Sit to stands 2 x 10 with UE  assist  ?Nustep 3 minutes lv3  ? ?10/20/21 ?Standing march x20 each with 3lb ?Standing hip abduction 2 x 10 each with 3lb ?Standing hip extension 2 x 10 each with 3lb  ?Step ups 7 inch x 10 each HHA x 2 ?REASSESSMENT MEASURES; SEE NOTE FROM THIS DATE ? ? ?HOME EXERCISE PROGRAM: ?Eval: seated march, heel/ toe raise, LAQ  ?Access Code: [email or web address] ?URL: [email or web address] ?Date: 09/08/2021 ?Prepared by: Josue Hector ?Access Code: [email or web address] ?URL: [email or web address] ?Date: 10/11/2021 ?Prepared by: Josue Hector ? ?Exercises ?Heel Raises with Counter Support - 2 x daily - 7 x weekly - 3 sets - 10 reps ?Standing Hip Abduction with Counter Support - 2 x daily - 7 x weekly - 3 sets - 10 reps ?Standing Hip Extension with Counter Support - 2 x daily - 7 x weekly - 3 sets - 10 reps ?Standing March with Counter Support - 2 x daily - 7 x weekly - 3 sets - 10 reps ? ?Access Code: [email or web address] ?URL: [email or web address] ?Date: 10/13/2021 ?Prepared by: [facility] - Rehab ? ?Exercises ?Side Stepping with Counter Support - 2 x daily - 7 x weekly - 5 sets - 10 reps ?Standing Tandem Balance with Counter Support - 2 x daily - 7 x weekly - 3 sets - 1 reps - 20 sec hold ? ? ?  ? ? PT Short Term Goals - 09/08/21 1510   ? ?  ? PT SHORT TERM GOAL #1  ? Title Patient will be independent with initial HEP and self-management strategies to improve functional outcomes   ? Time 3   ? Period Weeks   ? Status MET  ? Target Date 09/29/21   ?  ? PT SHORT TERM GOAL #2  ? Title Patient will be able to perform stand x 5 in < 30 seconds to demonstrate improvement in functional mobility and reduced risk for falls.   ? Time 3   ? Period Weeks   ? Status MET   ? Target Date 09/29/21   ? ?  ?  ? ?  ? ? ? PT Long Term Goals - 09/08/21 1510   ? ?  ? PT LONG TERM GOAL #1  ? Title Patient will be able to perform stand x 5 in < 15 seconds to demonstrate improvement in functional mobility and reduced risk for falls.   ? Time 6    ? Period  Weeks   ? Status Ongoing (Current 22.3 sec with Ues)  ? Target Date 10/20/21   ?  ? PT LONG TERM GOAL #2  ? Title Patient will have equal to or > 4+/5 MMT throughout BLE to improve ability to perform functional mobility, stair ambulation and ADLs.   ? Time 6   ? Period Weeks   ? Status Ongoing (partially met, see MMT)  ? Target Date 10/20/21   ?  ? PT LONG TERM GOAL #3  ? Title Patient will be able to ambulate at least 275 feet during 2MWT with LRAD to demonstrate improved ability to perform functional mobility and associated tasks.   ? Time 6   ? Period Weeks   ? Status Ongoing (Current 180 feet)   ? Target Date 10/20/21   ?  ? PT LONG TERM GOAL #4  ? Title Patient will be independent with advanced HEP and self-management strategies to improve functional outcomes   ? Time 6   ? Period Weeks   ? Status Ongoing   ? Target Date 10/20/21   ? ?  ? ? ?  ?  Clinical Impression Statement Continued with focus with balance training and LE strengthening.  Pt continues to require HHA with balance associated activities.  Cues needed to complete therex more slowly/controlled and for correct form/ mechanics.  Added foam to tandem stance to increase challenge as able to maintain on level ground for 30 seconds. Forward lunges added with more difficulty stabilizing with Rt LE lead and occasional UE assist needed due to LOB.  Pt required several seated rest breaks during session today. ?  ?  Personal Factors and Comorbidities Comorbidity 3+   ?  Comorbidities DM, HTN, CVA   ?  Examination-Activity Limitations Locomotion Level;Transfers;Stand;Stairs;Squat   ?  Examination-Participation Restrictions Community Activity;Laundry;Yard Work;Meal Prep;Cleaning   ?  Stability/Clinical Decision Making Evolving/Moderate complexity   ?  Rehab Potential Good   ?  PT Frequency 2x / week   ?  PT Duration 4 weeks   ?  PT Treatment/Interventions ADLs/Self Care Home Management;Aquatic Therapy;Biofeedback;DME Instruction;Gait  training;Cryotherapy;Stair training;Electrical Stimulation;Functional mobility training;Iontophoresis 66m/ml Dexamethasone;Therapeutic activities;Orthotic Fit/Training;Energy conservation;Splinting;Taping;Vasopneumatic DTennis Must

## 2021-11-17 ENCOUNTER — Ambulatory Visit (HOSPITAL_COMMUNITY): Payer: Medicare HMO | Admitting: Physical Therapy

## 2021-11-17 ENCOUNTER — Encounter (HOSPITAL_COMMUNITY): Payer: Self-pay | Admitting: Physical Therapy

## 2021-11-17 DIAGNOSIS — R2689 Other abnormalities of gait and mobility: Secondary | ICD-10-CM

## 2021-11-17 DIAGNOSIS — M6281 Muscle weakness (generalized): Secondary | ICD-10-CM | POA: Diagnosis not present

## 2021-11-17 NOTE — Therapy (Signed)
?OUTPATIENT PHYSICAL THERAPY TREATMENT NOTE ? ? ?Patient Name: Brandon French ?MRN: [id number] ?DOB:[birth year], 56 y.o., male ?Today's Date: 10/13/2021 ? ?PCP: Renee Rival, FNP ?REFERRING PROVIDER: Charlett Blake MD ? ?PHYSICAL THERAPY DISCHARGE SUMMARY ? ?Visits from Start of Care: 15 ? ?Current functional level related to goals / functional outcomes: ?See below ?  ?Remaining deficits: ?See below ?  ?Education / Equipment: ?See below  ? ?Patient agrees to discharge. Patient goals were partially met. Patient is being discharged due to meeting the stated rehab goals. ? ? ? PT End of Session - 11/17/21 1129   ? ? Visit Number 15   ? Number of Visits 16   ? Date for PT Re-Evaluation 11/17/21   ? Authorization Type [employer]   ? Authorization Time Period 8 approved 3/23-4/20/23   ? Authorization - Visit Number 6   ? Authorization - Number of Visits 8   ? Progress Note Due on Visit 18   ? PT Start Time 1130   ? PT Stop Time 1200   ? PT Time Calculation (min) 30 min   ? Activity Tolerance Patient tolerated treatment well   ? Behavior During Therapy [facility] for tasks assessed/performed   ? ?  ?  ? ?  ? ? ? ? ? ?Past Medical History:  ?Diagnosis Date  ? Chronic kidney disease   ? Diabetes mellitus ([place])   ? Elevated cholesterol   ? High blood pressure   ? ?Past Surgical History:  ?Procedure Laterality Date  ? TRANSURETHRAL RESECTION OF PROSTATE  2020  ? ?Patient Active Problem List  ? Diagnosis Date Noted  ? Controlled type 2 diabetes mellitus with complication, without long-term current use of insulin ([place]) 08/25/2021  ? CKD (chronic kidney disease) stage 3, GFR 30-59 ml/min (HCC) 08/25/2021  ? Benign prostatic hyperplasia without lower urinary tract symptoms 08/25/2021  ? Insomnia 08/25/2021  ? Screening for colon cancer 08/25/2021  ? HOCM (hypertrophic obstructive cardiomyopathy) ([place]) 06/09/2021  ? CVA (cerebral vascular accident) ([place]) 05/20/2021  ? Dyslipidemia   ? Dysphagia, post-stroke   ? Acute CVA  (cerebrovascular accident) ([place]) 05/17/2021  ? Stroke ([place]) 05/16/2021  ? DMII (diabetes mellitus, type 2) ([place]) 05/16/2021  ? HTN (hypertension) 05/16/2021  ? HLD (hyperlipidemia) 05/16/2021  ? ? ?REFERRING DIAG: LT side weakness s/p CVA  ? ?THERAPY DIAG:  ?Muscle weakness (generalized) ? ?Other abnormalities of gait and mobility ? ?PERTINENT HISTORY: CVA 05/16/21  ? ?PRECAUTIONS: fall ? ?SUBJECTIVE: Pt states he feels much stronger since beginning therapy.  Reports 80-90% overall improvement.  Balance has been getting. Still having to use cane just to be sure with walking. Has not had any falls.  ? ?PAIN:  ?Are you having pain? No ? ?Objective: ? [facility] PT Assessment - 11/17/21 0001   ? ?  ? Assessment  ? Medical Diagnosis LT side weakness s/p CVA   ? Referring Provider (PT) Alysia Penna MD   ? Onset Date/Surgical Date 05/16/21   ? Next MD Visit unsure   ?  ? Restrictions  ? Weight Bearing Restrictions No   ?  ? Balance Screen  ? Has the patient fallen in the past 6 months No   ? Has the patient had a decrease in activity level because of a fear of falling?  No   ? Is the patient reluctant to leave their home because of a fear of falling?  No   ?  ? Strength  ? Right Hip Flexion 5/5   ?  Left Hip Flexion 4+/5   ? Right Knee Flexion 5/5   ? Right Knee Extension 5/5   ? Left Knee Flexion 5/5   ? Left Knee Extension 5/5   ? Right Ankle Dorsiflexion 5/5   ? Left Ankle Dorsiflexion 5/5   ?  ? Transfers  ? Five time sit to stand comments  22.22 seconds with/without UE support   ?  ? Ambulation/Gait  ? Ambulation/Gait Yes   ? Ambulation/Gait Assistance 5: Supervision;4: Min guard   ? Ambulation Distance (Feet) 200 Feet   ? Assistive device Straight cane   ? Gait Pattern Decreased stride length;Decreased arm swing - left;Decreased stance time - left;Wide base of support   ? Gait Comments 2 MWT   ? ?  ?  ? ?  ? ? ? ? ?TODAY'S TREATMENT:  ?11/17/21 ?Reassessment ?Education ? ? ?11/15/21 ? Heelraises on incline 20X warm up  (cues to hold and lower slowly) ?Step taps 6 inch x20 intermittent UE assist of Rt UE  ? Tandem stance 2X30" each on blue foam beam ?Lunges onto 4" step no UE 2X 10X each ?Vectors 5X5" with 1 UE assist ?Step taps 6 inch x20 intermittent UE assist of Rt UE  ?Step ups 6 inch 2 x 10 each 1 UE assist Lt LE, intermittent bil UE with Rt LE; with opp LE power up ? STS controlled no HHA 10x ?  ?11/10/21 ? Step taps 6 inch x20 intermittent UE assist of Rt UE  ? Tandem stance with 1 foot on 6in step ?  Last set 10 UE flexion ? SLS with 1 finger aid for Lt x 30", Rt SLS 17" ? Squat 5 reps front of chair, cueing for mechaics ? STS controlled no HHA 10x ? Sidestep and retro gait with GTB around thigh no AD ? ? ?11/03/21 ?Heel raise on incline 20X  ?Toe raises on incline 20X ?Tandem stance 30" each LE lead ?Sidestepping 2 RT on blue line no AD  ?Retro walking 2RT on blue line no AD  ?Tandem walking 2RT on blue line no AD, min A ?Step taps 6 inch x20 intermittent UE assist of Rt UE  ?Step ups 6 inch 2 x 10 each 1 UE assist Lt LE, intermittent bil UE with Rt LE ?Sit to stands X10 without UE assist  ?Palloff press RTB 3 x 10 each  ? ? ?11/01/21 ?Heel raise on incline 20X  ?Toe raises on incline 20X ?Sidestepping 2 RT on blue line no AD  ?Retro walking 2RT on blue line no AD  ?Tandem walking 2RT on blue line no AD, min A ?Step taps 6 inch x20 intermittent UE assist of Rt UE (mostly Rt tap) ?Step ups 6 inch 2 x 10 each 1 UE assist  ?Sit to stands 2 x 10 with UE assist  ? ?10/25/21 ?Heel raise x20  ?Sidestepping 5 RT in bars  ?Retro walking 5RT in bars  ?Step taps 6 inch x20  ?Step ups 6 inch 2 x 10 each  ?Palloff press RTB 3 x 10 each  ?Sit to stands 2 x 10 with UE assist  ?Nustep 3 minutes lv3  ? ?10/20/21 ?Standing march x20 each with 3lb ?Standing hip abduction 2 x 10 each with 3lb ?Standing hip extension 2 x 10 each with 3lb  ?Step ups 7 inch x 10 each HHA x 2 ?REASSESSMENT MEASURES; SEE NOTE FROM THIS DATE ? ? ?HOME EXERCISE  PROGRAM: ?Eval: seated march, heel/ toe raise, LAQ  ?  Access Code: [email or web address] ?Access Code: [email or web address] ?- Heel Raises with Counter Support  - 2 x daily - 7 x weekly - 3 sets - 10 reps ?- Standing Hip Abduction with Counter Support  - 2 x daily - 7 x weekly - 3 sets - 10 reps ?- Standing Hip Extension with Counter Support  - 2 x daily - 7 x weekly - 3 sets - 10 reps ?- Standing March with Counter Support  - 2 x daily - 7 x weekly - 3 sets - 10 reps ?- Side Stepping with Counter Support  - 2 x daily - 7 x weekly - 5 sets - 10 reps ?- Standing Tandem Balance with Counter Support  - 2 x daily - 7 x weekly - 3 reps - 20 second hold ?- Sit to Stand  - 2 x daily - 7 x weekly - 3 sets - 10 reps ? ?PATIENT EDUCATION:  ?Education details: HEP, joining gym which will likely be covered by insurance, increasing activity, returning to PT if needed, reviewed and reissued HEP handouts ?Person educated: Patient ?Education method: Explanation, Demonstration, and Handouts ?Education comprehension: verbalized understanding, returned demonstration, verbal cues required, and tactile cues required ? ?  ? ? PT Short Term Goals - 09/08/21 1510   ? ?  ? PT SHORT TERM GOAL #1  ? Title Patient will be independent with initial HEP and self-management strategies to improve functional outcomes   ? Time 3   ? Period Weeks   ? Status MET  ? Target Date 09/29/21   ?  ? PT SHORT TERM GOAL #2  ? Title Patient will be able to perform stand x 5 in < 30 seconds to demonstrate improvement in functional mobility and reduced risk for falls.   ? Time 3   ? Period Weeks   ? Status MET   ? Target Date 09/29/21   ? ?  ?  ? ?  ? ? ? PT Long Term Goals - 09/08/21 1510   ? ?  ? PT LONG TERM GOAL #1  ? Title Patient will be able to perform stand x 5 in < 15 seconds to demonstrate improvement in functional mobility and reduced risk for falls.   ? Time 6   ? Period Weeks   ? Status Not met (Current 22.22 sec without Ues)  ? Target Date 10/20/21   ?  ? PT LONG TERM GOAL #2   ? Title Patient will have equal to or > 4+/5 MMT throughout BLE to improve ability to perform functional mobility, stair ambulation and ADLs.   ? Time 6   ? Period Weeks   ? Status (partially met, see MMT)  ? [place]

## 2021-11-24 ENCOUNTER — Other Ambulatory Visit: Payer: Self-pay | Admitting: Nurse Practitioner

## 2021-11-24 DIAGNOSIS — E782 Mixed hyperlipidemia: Secondary | ICD-10-CM

## 2022-01-27 ENCOUNTER — Other Ambulatory Visit: Payer: Self-pay | Admitting: Nurse Practitioner

## 2022-01-27 DIAGNOSIS — E118 Type 2 diabetes mellitus with unspecified complications: Secondary | ICD-10-CM

## 2022-02-20 ENCOUNTER — Telehealth: Payer: Self-pay | Admitting: Nurse Practitioner

## 2022-02-20 ENCOUNTER — Other Ambulatory Visit: Payer: Self-pay

## 2022-02-20 DIAGNOSIS — I10 Essential (primary) hypertension: Secondary | ICD-10-CM

## 2022-02-20 MED ORDER — HYDRALAZINE HCL 25 MG PO TABS: 25.0000 mg | ORAL_TABLET | Freq: Three times a day (TID) | ORAL | 1 refills | Status: DC

## 2022-02-20 NOTE — Telephone Encounter (Signed)
Refill sent.

## 2022-02-20 NOTE — Telephone Encounter (Signed)
Pt called stating that he is needing a refill on hydrALAZINE (APRESOLINE) 25 MG tablet . Wants to know if you can please refill?    [facility] [place]

## 2022-03-02 ENCOUNTER — Other Ambulatory Visit: Payer: Self-pay | Admitting: Nurse Practitioner

## 2022-03-02 DIAGNOSIS — E782 Mixed hyperlipidemia: Secondary | ICD-10-CM

## 2022-03-09 ENCOUNTER — Encounter: Payer: Self-pay | Admitting: Nurse Practitioner

## 2022-03-09 ENCOUNTER — Ambulatory Visit (INDEPENDENT_AMBULATORY_CARE_PROVIDER_SITE_OTHER): Payer: Medicare HMO | Admitting: Nurse Practitioner

## 2022-03-09 VITALS — BP 160/88 | HR 86 | Ht 72.0 in | Wt 307.0 lb

## 2022-03-09 DIAGNOSIS — E118 Type 2 diabetes mellitus with unspecified complications: Secondary | ICD-10-CM | POA: Diagnosis not present

## 2022-03-09 DIAGNOSIS — I10 Essential (primary) hypertension: Secondary | ICD-10-CM | POA: Diagnosis not present

## 2022-03-09 DIAGNOSIS — Z1211 Encounter for screening for malignant neoplasm of colon: Secondary | ICD-10-CM | POA: Diagnosis not present

## 2022-03-09 DIAGNOSIS — N400 Enlarged prostate without lower urinary tract symptoms: Secondary | ICD-10-CM | POA: Diagnosis not present

## 2022-03-09 DIAGNOSIS — H6123 Impacted cerumen, bilateral: Secondary | ICD-10-CM

## 2022-03-09 DIAGNOSIS — I421 Obstructive hypertrophic cardiomyopathy: Secondary | ICD-10-CM | POA: Diagnosis not present

## 2022-03-09 DIAGNOSIS — E782 Mixed hyperlipidemia: Secondary | ICD-10-CM | POA: Diagnosis not present

## 2022-03-09 DIAGNOSIS — N1832 Chronic kidney disease, stage 3b: Secondary | ICD-10-CM | POA: Diagnosis not present

## 2022-03-09 DIAGNOSIS — H6120 Impacted cerumen, unspecified ear: Secondary | ICD-10-CM | POA: Insufficient documentation

## 2022-03-09 MED ORDER — HYDRALAZINE HCL 50 MG PO TABS: 50.0000 mg | ORAL_TABLET | Freq: Two times a day (BID) | ORAL | 0 refills | Status: DC

## 2022-03-09 MED ORDER — UNABLE TO FIND: 0 refills | Status: DC

## 2022-03-09 MED ORDER — UNABLE TO FIND: 2 refills | Status: DC

## 2022-03-09 NOTE — Assessment & Plan Note (Signed)
Has not establish care with nephrology Need for referral discussed today  Referral  placed Avoid NSAIDs and other nephrotoxic agents Lab Results  Component Value Date   NA 142 08/26/2021   K 4.0 08/26/2021   CO2 25 08/26/2021   GLUCOSE 150 (H) 08/26/2021   BUN 28 (H) 08/26/2021   CREATININE 1.90 (H) 08/26/2021   CALCIUM 9.1 08/26/2021   EGFR 41 (L) 08/26/2021   GFRNONAA 36 (L) 06/06/2021

## 2022-03-09 NOTE — Assessment & Plan Note (Signed)
Has chronic urinary incontinence , denies hesitancy  He has stopped taking Flomax.and Proscar Will discuss need for referral to urology at next office visit

## 2022-03-09 NOTE — Assessment & Plan Note (Addendum)
Denies any complaints today  Was previously seeing a cardiologist at his previous location Recent echo showed severe asymmetric left ventricular hypertrophy of the septal segment, there was  recommendation for cardiac MRI. Referral sent to cardiology today

## 2022-03-09 NOTE — Assessment & Plan Note (Signed)
Ear lavage provided in the office today.

## 2022-03-09 NOTE — Assessment & Plan Note (Addendum)
BP Readings from Last 3 Encounters:  03/09/22 (!) 160/88  08/25/21 (!) 160/110  08/18/21 (!) 190/117  Currently on amlodipine 10 mg daily, clonidine 0.2 mg 3 times daily, hydralazine 25 mg 3 times daily.  Has not been taking hydralazine consistently due to timing of administration Continue amlodipine 10 mg daily, clonidine 0.2 mg 3 times daily start hydralazine 50 mg twice daily Patient referred to nephrologist due to  CKD DASH diet advised, follow-up in the office in 4 weeks

## 2022-03-09 NOTE — Assessment & Plan Note (Signed)
Lab Results  Component Value Date   CHOL 141 08/26/2021   HDL 33 (L) 08/26/2021   LDLCALC 90 08/26/2021   TRIG 98 08/26/2021   CHOLHDL 4.3 08/26/2021  Currently on atorvastatin 80 mg daily LDL goal is less than 55 check lipid panel

## 2022-03-09 NOTE — Progress Notes (Addendum)
Brandon French     MRN: [id number]      DOB: [birth year]   HPI Brandon French with past medical history of stage IIIb CKD, hypertension, stroke, hypertrophic obstructive cardiomyopathy, type 2 diabetes, hyperlipidemia is here for follow up and re-evaluation of chronic medical conditions, medication management   Hypertension.  Currently on amlodipine 10 mg daily, clonidine 0.2 mg three daily, hydralazine 25 mg 3 times daily.  Patient stated that he sometimes forgets to take third dose of hydralazine.  He denies chest pain, dizziness, syncope  T2 diabetes.  Currently on Amaryl 1 mg daily.  He has not been checking blood sugar a, has polyuria which is not new   CKD stage 3b.  Patient was previously seeing nephrologist before moving to [facility]   He was referred to nephrologist at his last office visit but he reports that he has not been seen yet.   BPH . He has stopped taking Flomax and Proscar because he was not experiencing any difficulty passing urine. Stated that he had foley placed in the past . He does have urinary incontinence.   HOCM. Was previously seeing cardiology before moving to [facility], yet to establish care here, he denies CP, SOB, palpitations, had chronic left leg edema.   Patient complains of sensation of fullness to both the ears, he denies ear pain, ear discharge.        ROS Denies recent fever or chills. Denies sinus pressure, nasal congestion, ear pain or sore throat. Denies chest congestion, productive cough or wheezing. Denies chest pains, palpitations and leg swelling Denies abdominal pain, nausea, vomiting,diarrhea or constipation.   Denies headaches, seizures, numbness, or tingling. Denies depression, anxiety or insomnia. Denies skin break down or rash.   PE  BP (!) 160/88 (BP Location: Right Arm)   Pulse 86   Ht 6' (1.829 m)   Wt (!) 307 lb (139.3 kg)   SpO2 94%   BMI 41.64 kg/m   Patient alert and oriented and in no cardiopulmonary  distress.  HEENT: No facial asymmetry, EOMI,     Neck supple .excess wax noted in both ears, no drainage or redness noted    Chest: Clear to auscultation bilaterally.  CVS: S1, S2 no murmurs, no S3.Regular rate.  ABD: Soft non tender.   Ext: edema  LLE  MS: Adequate ROM spine, shoulders, hips and knees.  Skin: hyperpigmented skin noted on BLE  Psych: Good eye contact, normal affect. Memory intact not anxious or depressed appearing.    Assessment & Plan  HTN (hypertension) BP Readings from Last 3 Encounters:  03/09/22 (!) 160/88  08/25/21 (!) 160/110  08/18/21 (!) 190/117  Currently on amlodipine 10 mg daily, clonidine 0.2 mg 3 times daily, hydralazine 25 mg 3 times daily.  Has not been taking hydralazine consistently due to timing of administration Continue amlodipine 10 mg daily, clonidine 0.2 mg 3 times daily start hydralazine 50 mg twice daily Patient referred to nephrologist due to  CKD DASH diet advised, follow-up in the office in 4 weeks  HOCM (hypertrophic obstructive cardiomyopathy) ([place]) Denies any complaints today  Was previously seeing a cardiologist at his previous location Recent echo showed severe asymmetric left ventricular hypertrophy of the septal segment, there was  recommendation for cardiac MRI. Referral sent to cardiology today      Controlled type 2 diabetes mellitus with complication, without long-term current use of insulin ([place]) Lab Results  Component Value Date   HGBA1C 7.2 (H) 08/26/2021  On Amaryl 1  mg daily Not on ACE or ARB has chronic kidney disease Patient referred to nephrologist for CKD Check A1C,urine microalbumin creatinine lab refused foot exam today  Check A1C Avoid sugar , sweet, soda  CKD (chronic kidney disease) stage 3, GFR 30-59 ml/min (HCC) Has not establish care with nephrology Need for referral discussed today  Referral  placed Avoid NSAIDs and other nephrotoxic agents Lab Results  Component Value Date   NA 142  08/26/2021   K 4.0 08/26/2021   CO2 25 08/26/2021   GLUCOSE 150 (H) 08/26/2021   BUN 28 (H) 08/26/2021   CREATININE 1.90 (H) 08/26/2021   CALCIUM 9.1 08/26/2021   EGFR 41 (L) 08/26/2021   GFRNONAA 36 (L) 06/06/2021    HLD (hyperlipidemia) Lab Results  Component Value Date   CHOL 141 08/26/2021   HDL 33 (L) 08/26/2021   LDLCALC 90 08/26/2021   TRIG 98 08/26/2021   CHOLHDL 4.3 08/26/2021  Currently on atorvastatin 80 mg daily LDL goal is less than 55 check lipid panel  Benign prostatic hyperplasia without lower urinary tract symptoms Has chronic urinary incontinence , denies hesitancy  He has stopped taking Flomax.and Proscar Will discuss need for referral to urology at next office visit   Excess ear wax Ear lavage provided in the office today.

## 2022-03-09 NOTE — Patient Instructions (Addendum)
Please get TDAP vaccine, shingles vaccine at the pharmacy  Get your fasting labs done as dicussed nurse please order glucometer today  Please start taking hydralazine 50mg  twice daily for blood pressure    Goal for fasting blood sugar ranges from 80 to 120 and 2 hours after any meal or at bedtime should be between 130 to 170.       It is important that you exercise regularly at least 30 minutes 5 times a week.  Think about what you will eat, plan ahead. Choose " clean, green, fresh or frozen" over canned, processed or packaged foods which are more sugary, salty and fatty. 70 to 75% of food eaten should be vegetables and fruit. Three meals at set times with snacks allowed between meals, but they must be fruit or vegetables. Aim to eat over a 12 hour period , example 7 am to 7 pm, and STOP after  your last meal of the day. Drink water,generally about 64 ounces per day, no other drink is as healthy. Fruit juice is best enjoyed in a healthy way, by EATING the fruit.  Thanks for choosing [facility], we consider it a privelige to serve you. Nurse

## 2022-03-09 NOTE — Assessment & Plan Note (Signed)
Lab Results  Component Value Date   HGBA1C 7.2 (H) 08/26/2021  On Amaryl 1 mg daily Not on ACE or ARB has chronic kidney disease Patient referred to nephrologist for CKD Check A1C,urine microalbumin creatinine lab refused foot exam today  Check A1C Avoid sugar , sweet, soda

## 2022-03-14 ENCOUNTER — Encounter: Payer: Self-pay | Admitting: *Deleted

## 2022-03-16 DIAGNOSIS — E118 Type 2 diabetes mellitus with unspecified complications: Secondary | ICD-10-CM | POA: Diagnosis not present

## 2022-03-16 DIAGNOSIS — E782 Mixed hyperlipidemia: Secondary | ICD-10-CM | POA: Diagnosis not present

## 2022-03-16 DIAGNOSIS — I10 Essential (primary) hypertension: Secondary | ICD-10-CM | POA: Diagnosis not present

## 2022-03-16 DIAGNOSIS — D509 Iron deficiency anemia, unspecified: Secondary | ICD-10-CM | POA: Diagnosis not present

## 2022-03-17 ENCOUNTER — Other Ambulatory Visit: Payer: Self-pay | Admitting: Nurse Practitioner

## 2022-03-17 DIAGNOSIS — N1832 Chronic kidney disease, stage 3b: Secondary | ICD-10-CM

## 2022-03-17 DIAGNOSIS — E782 Mixed hyperlipidemia: Secondary | ICD-10-CM

## 2022-03-17 DIAGNOSIS — D509 Iron deficiency anemia, unspecified: Secondary | ICD-10-CM

## 2022-03-17 MED ORDER — DAPAGLIFLOZIN PROPANEDIOL 10 MG PO TABS: 10.0000 mg | ORAL_TABLET | Freq: Every day | ORAL | 0 refills | Status: DC

## 2022-03-17 MED ORDER — EZETIMIBE 10 MG PO TABS: 10.0000 mg | ORAL_TABLET | Freq: Every day | ORAL | 3 refills | Status: DC

## 2022-03-17 NOTE — Progress Notes (Signed)
A1c is stable continue current dose of amaryl  For CKD start faxiga 10mg  daily , I referred him to nephrology he should follow up with them for better management of his CKD, no NSAIDS   LDL not at gaol of less than 55, start zetia 10mg  daily   Pt should follow up as planned   I have added on iron panel , please notify lab  Thanks

## 2022-03-18 ENCOUNTER — Other Ambulatory Visit: Payer: Self-pay | Admitting: Nurse Practitioner

## 2022-03-18 DIAGNOSIS — D508 Other iron deficiency anemias: Secondary | ICD-10-CM

## 2022-03-18 DIAGNOSIS — E782 Mixed hyperlipidemia: Secondary | ICD-10-CM

## 2022-03-18 LAB — IRON,TIBC AND FERRITIN PANEL
Ferritin: 14 ng/mL — ABNORMAL LOW (ref 30–400)
Iron Saturation: 13 % — ABNORMAL LOW (ref 15–55)
Iron: 35 ug/dL — ABNORMAL LOW (ref 38–169)
Total Iron Binding Capacity: 275 ug/dL (ref 250–450)
UIBC: 240 ug/dL (ref 111–343)

## 2022-03-18 LAB — SPECIMEN STATUS REPORT

## 2022-03-18 MED ORDER — IRON (FERROUS SULFATE) 325 (65 FE) MG PO TABS: 325.0000 mg | ORAL_TABLET | Freq: Every day | ORAL | 3 refills | Status: DC

## 2022-03-18 NOTE — Progress Notes (Signed)
Iron deficiency anemia. Start ferrous sulphate 325 mg one tablet daily, take OTC stool softer as needed for constipation, eat plenty of fibers.

## 2022-03-19 LAB — LIPID PANEL
Chol/HDL Ratio: 3.1 ratio (ref 0.0–5.0)
Cholesterol, Total: 147 mg/dL (ref 100–199)
HDL: 48 mg/dL (ref >39)
LDL Chol Calc (NIH): 88 mg/dL (ref 0–99)
Triglycerides: 54 mg/dL (ref 0–149)
VLDL Cholesterol Cal: 11 mg/dL (ref 5–40)

## 2022-03-19 LAB — MICROALBUMIN / CREATININE URINE RATIO
Creatinine, Urine: 123.1 mg/dL
Microalb/Creat Ratio: 272 mg/g creat — ABNORMAL HIGH (ref 0–29)
Microalbumin, Urine: 334.9 ug/mL

## 2022-03-19 LAB — CBC
Hematocrit: 37.6 % (ref 37.5–51.0)
Hemoglobin: 11.7 g/dL — ABNORMAL LOW (ref 13.0–17.7)
MCH: 24.4 pg — ABNORMAL LOW (ref 26.6–33.0)
MCHC: 31.1 g/dL — ABNORMAL LOW (ref 31.5–35.7)
MCV: 79 fL (ref 79–97)
Platelets: 375 10*3/uL (ref 150–450)
RBC: 4.79 x10E6/uL (ref 4.14–5.80)
RDW: 15.4 % (ref 11.6–15.4)
WBC: 6.9 10*3/uL (ref 3.4–10.8)

## 2022-03-19 LAB — CMP14+EGFR
ALT: 15 IU/L (ref 0–44)
AST: 17 IU/L (ref 0–40)
Albumin/Globulin Ratio: 1.2 (ref 1.2–2.2)
Albumin: 3.6 g/dL — ABNORMAL LOW (ref 3.8–4.9)
Alkaline Phosphatase: 54 IU/L (ref 44–121)
BUN/Creatinine Ratio: 11 (ref 9–20)
BUN: 23 mg/dL (ref 6–24)
Bilirubin Total: 0.2 mg/dL (ref 0.0–1.2)
CO2: 25 mmol/L (ref 20–29)
Calcium: 8.5 mg/dL — ABNORMAL LOW (ref 8.7–10.2)
Chloride: 104 mmol/L (ref 96–106)
Creatinine, Ser: 2.09 mg/dL — ABNORMAL HIGH (ref 0.76–1.27)
Globulin, Total: 3 g/dL (ref 1.5–4.5)
Glucose: 114 mg/dL — ABNORMAL HIGH (ref 70–99)
Potassium: 4.3 mmol/L (ref 3.5–5.2)
Sodium: 141 mmol/L (ref 134–144)
Total Protein: 6.6 g/dL (ref 6.0–8.5)
eGFR: 37 mL/min/{1.73_m2} — ABNORMAL LOW (ref >59)

## 2022-03-19 LAB — HEMOGLOBIN A1C
Est. average glucose Bld gHb Est-mCnc: 117 mg/dL
Hgb A1c MFr Bld: 5.7 % — ABNORMAL HIGH (ref 4.8–5.6)

## 2022-03-23 ENCOUNTER — Other Ambulatory Visit: Payer: Self-pay | Admitting: Nurse Practitioner

## 2022-03-23 DIAGNOSIS — N400 Enlarged prostate without lower urinary tract symptoms: Secondary | ICD-10-CM

## 2022-03-28 ENCOUNTER — Ambulatory Visit: Payer: Medicare HMO | Admitting: Podiatry

## 2022-03-28 DIAGNOSIS — M79675 Pain in left toe(s): Secondary | ICD-10-CM

## 2022-03-28 DIAGNOSIS — M79674 Pain in right toe(s): Secondary | ICD-10-CM

## 2022-03-28 DIAGNOSIS — B351 Tinea unguium: Secondary | ICD-10-CM | POA: Diagnosis not present

## 2022-03-28 DIAGNOSIS — R609 Edema, unspecified: Secondary | ICD-10-CM

## 2022-03-29 NOTE — Progress Notes (Signed)
  Subjective:  Patient ID: Brandon French, male    DOB: November 08, 1965,  MRN: [id number]  Chief Complaint  Patient presents with   Foot Problem    diabetic foot care, nail trim requested    56 y.o. male presents with the above complaint. History confirmed with patient.  The left hallux nail is bothering him.  He also has swelling in both feet the left is worse  Objective:  Physical Exam: warm, good capillary refill, no trophic changes or ulcerative lesions, normal DP and PT pulses, venous stasis dermatitis noted, and +2 pitting edema LLE, negative Homan or Pratt test, no evidence of DVT, dystrophic hallux nails with subungual debris.  Assessment:   1. Peripheral edema   2. Pain due to onychomycosis of toenails of both feet      Plan:  Patient was evaluated and treated and all questions answered.  Patient educated on diabetes. Discussed proper diabetic foot care and discussed risks and complications of disease. Educated patient in depth on reasons to return to the office immediately should he/she discover anything concerning or new on the feet. All questions answered. Discussed proper shoes as well.   Discussed the etiology and treatment options for the condition in detail with the patient. Educated patient on the topical and oral treatment options for mycotic nails. Recommended debridement of the nails today. Sharp and mechanical debridement performed of all painful and mycotic nails today. Nails debrided in length and thickness using a nail nipper to level of comfort. Discussed treatment options including appropriate shoe gear. Follow up as needed for painful nails.   For the peripheral edema I do not see any evidence of local pathology causing this there is no evidence of DVT.  Discussed peripheral edema in general and recommended compression stockings and elevation of the legs.  Return if symptoms worsen or fail to improve.

## 2022-04-18 ENCOUNTER — Ambulatory Visit (INDEPENDENT_AMBULATORY_CARE_PROVIDER_SITE_OTHER): Payer: Medicare HMO | Admitting: Nurse Practitioner

## 2022-04-18 ENCOUNTER — Encounter: Payer: Self-pay | Admitting: Nurse Practitioner

## 2022-04-18 VITALS — BP 164/82 | HR 106 | Ht 72.0 in | Wt 319.0 lb

## 2022-04-18 DIAGNOSIS — E782 Mixed hyperlipidemia: Secondary | ICD-10-CM

## 2022-04-18 DIAGNOSIS — Z23 Encounter for immunization: Secondary | ICD-10-CM

## 2022-04-18 DIAGNOSIS — R600 Localized edema: Secondary | ICD-10-CM | POA: Diagnosis not present

## 2022-04-18 DIAGNOSIS — N1832 Chronic kidney disease, stage 3b: Secondary | ICD-10-CM | POA: Diagnosis not present

## 2022-04-18 DIAGNOSIS — I10 Essential (primary) hypertension: Secondary | ICD-10-CM | POA: Diagnosis not present

## 2022-04-18 MED ORDER — HYDRALAZINE HCL 100 MG PO TABS
100.0000 mg | ORAL_TABLET | Freq: Two times a day (BID) | ORAL | 3 refills | Status: DC
Start: 2022-04-18 — End: 2022-07-19

## 2022-04-18 MED ORDER — UNABLE TO FIND: 0 refills | Status: DC

## 2022-04-18 MED ORDER — UNABLE TO FIND: 2 refills | Status: DC

## 2022-04-18 MED ORDER — CLONIDINE HCL 0.2 MG PO TABS: 0.2000 mg | ORAL_TABLET | Freq: Three times a day (TID) | ORAL | 1 refills | Status: DC

## 2022-04-18 NOTE — Assessment & Plan Note (Signed)
Lab Results  Component Value Date   CHOL 147 03/16/2022   HDL 48 03/16/2022   LDLCALC 88 03/16/2022   TRIG 54 03/16/2022   CHOLHDL 3.1 03/16/2022  Goal is less than 55 due to history of CVA He is not sure he is he has been taking Zetia on patient encouraged to pick up the medication from the pharmacy if he has not already done so and take  10 mg daily Continue atorvastatin 80 mg daily

## 2022-04-18 NOTE — Patient Instructions (Addendum)
Please start taking hydralazine 100mg  two time daily , continue amlodipine 10mg  daily , clonidine 0.2mg  three time daily.   Nurse please order glucometer and blood pressure monitor  Please bring all your medications with you to your appointment   It is important that you exercise regularly at least 30 minutes 5 times a weekly as tolerated .  Think about what you will eat, plan ahead. Choose " clean, green, fresh or frozen" over canned, processed or packaged foods which are more sugary, salty and fatty. 70 to 75% of food eaten should be vegetables and fruit. Three meals at set times with snacks allowed between meals, but they must be fruit or vegetables. Aim to eat over a 12 hour period , example 7 am to 7 pm, and STOP after  your last meal of the day. Drink water,generally about 64 ounces per day, no other drink is as healthy. Fruit juice is best enjoyed in a healthy way, by EATING the fruit.  Thanks for choosing [facility], we consider it a privelige to serve you.

## 2022-04-18 NOTE — Progress Notes (Signed)
Established Patient Office Visit  Subjective:  Patient ID: Brandon French, male    DOB: 01/30/1966  Age: 56 y.o. MRN: [id number]  CC:  Chief Complaint  Patient presents with   Hypertension    4 week f/u   Diabetes    4 week f/u    HPI Brandon French is a 56 y.o. male with past medical history of type 2 diabetes with complication, CVA, hypertension, CKD stage III, BPH, hyperlipidemia presents for f/u hypertension and CKD.   Hypertension.  Currently on amlodipine 10 mg daily, hydralazine 50 mg twice daily, clonidine 0.2 mg 3 times daily.  He denies chest pain, dizziness, syncope.  Has upcoming appointment with cardiology  CKD stage IIIb.  He has started taking Farxiga 10 mg daily.  Patient denies urinary hesitancy, nausea, vomiting.  He has not established care with nephrology yet.  Flu vaccine Given in the office today  Past Medical History:  Diagnosis Date   Chronic kidney disease    Diabetes mellitus (HCC)    Elevated cholesterol    High blood pressure     Past Surgical History:  Procedure Laterality Date   TRANSURETHRAL RESECTION OF PROSTATE  2020    Family History  Problem Relation Age of Onset   Stroke Mother    Diabetes Mother    Cancer Mother    Diabetes Father    Kidney failure Father    Colon cancer Half-Brother    Lung cancer Half-Brother     Social History   Socioeconomic History   Marital status: Divorced    Spouse name: Not on file   Number of children: 2   Years of education: 11   Highest education level: Not on file  Occupational History   Not on file  Tobacco Use   Smoking status: Former    Packs/day: 0.50    Years: 8.00    Total pack years: 4.00    Types: Cigarettes    Quit date: 2011    Years since quitting: 12.7   Smokeless tobacco: Never   Tobacco comments:    Pt stated that he smoked for about 8 years , he quit 2011.  Vaping Use   Vaping Use: Never used  Substance and Sexual Activity   Alcohol use: Never   Drug use: Never    Sexual activity: Not Currently  Other Topics Concern   Not on file  Social History Narrative   Pt lives with his friends.    Social Determinants of Health   Financial Resource Strain: Low Risk  (09/01/2021)   Overall Financial Resource Strain (CARDIA)    Difficulty of Paying Living Expenses: Not hard at all  Food Insecurity: No Food Insecurity (09/01/2021)   Hunger Vital Sign    Worried About Running Out of Food in the Last Year: Never true    Ran Out of Food in the Last Year: Never true  Transportation Needs: No Transportation Needs (09/01/2021)   PRAPARE - Hydrologist (Medical): No    Lack of Transportation (Non-Medical): No  Physical Activity: Sufficiently Active (09/01/2021)   Exercise Vital Sign    Days of Exercise per Week: 4 days    Minutes of Exercise per Session: 50 min  Stress: No Stress Concern Present (09/01/2021)   [place]    Feeling of Stress : Not at all  Social Connections: Socially Isolated (09/01/2021)   Social Connection and Isolation Panel [NHANES]  Frequency of Communication with Friends and Family: More than three times a week    Frequency of Social Gatherings with Friends and Family: Never    Attends Religious Services: Never    Marine scientist or Organizations: No    Attends Archivist Meetings: Never    Marital Status: Divorced  Human resources officer Violence: Not At Risk (09/01/2021)   Humiliation, Afraid, Rape, and Kick questionnaire    Fear of Current or Ex-Partner: No    Emotionally Abused: No    Physically Abused: No    Sexually Abused: No    Outpatient Medications Prior to Visit  Medication Sig Dispense Refill   amLODipine (NORVASC) 10 MG tablet Take 1 tablet (10 mg total) by mouth daily. 30 tablet 3   atorvastatin (LIPITOR) 80 MG tablet TAKE 1 TABLET BY MOUTH EVERY DAY 90 tablet 0   clopidogrel (PLAVIX) 75 MG tablet Take 1 tablet (75 mg  total) by mouth daily. Please take Aspirin 81 mg daily along with Plavix 75 mg daily for 30 days. STOP the aspirin after 30 tablet 1   dapagliflozin propanediol (FARXIGA) 10 MG TABS tablet Take 1 tablet (10 mg total) by mouth daily before breakfast. 90 tablet 0   diclofenac Sodium (VOLTAREN) 1 % GEL APPLY 2 G TOPICALLY 4 (FOUR) TIMES DAILY. TO RIGHT TOE/FOOT 400 g 1   gabapentin (NEURONTIN) 100 MG capsule Take 1 capsule (100 mg total) by mouth at bedtime. 30 capsule 1   glimepiride (AMARYL) 1 MG tablet TAKE 1 TABLET BY MOUTH EVERY DAY WITH BREAKFAST 90 tablet 1   Iron, Ferrous Sulfate, 325 (65 Fe) MG TABS Take 325 mg by mouth daily. 30 tablet 3   traZODone (DESYREL) 50 MG tablet Take 1 tablet (50 mg total) by mouth at bedtime. 30 tablet 1   cloNIDine (CATAPRES) 0.2 MG tablet Take 1 tablet (0.2 mg total) by mouth 3 (three) times daily. 90 tablet 1   hydrALAZINE (APRESOLINE) 50 MG tablet Take 1 tablet (50 mg total) by mouth 2 (two) times daily. 180 tablet 0   acetaminophen (TYLENOL) 325 MG tablet Take 1-2 tablets (325-650 mg total) by mouth every 4 (four) hours as needed for mild pain. (Patient not taking: Reported on 04/18/2022)     aspirin 81 MG EC tablet Take 1 tablet (81 mg total) by mouth daily with breakfast. Please take Aspirin 81 mg daily till 11/21 then stop. (Patient not taking: Reported on 04/18/2022) 13 tablet 0   ezetimibe (ZETIA) 10 MG tablet Take 1 tablet (10 mg total) by mouth daily. (Patient not taking: Reported on 04/18/2022) 90 tablet 3   finasteride (PROSCAR) 5 MG tablet Take 1 tablet (5 mg total) by mouth daily. (Patient not taking: Reported on 04/18/2022) 30 tablet 1   food thickener (SIMPLYTHICK, HONEY/LEVEL 3/MODERATELY THICK,) LIQD Use one packet to thicken any liquids to honey consistency. (Patient not taking: Reported on 04/18/2022) 300 packet 1   pantoprazole (PROTONIX) 40 MG tablet Take 1 tablet (40 mg total) by mouth daily. (Patient not taking: Reported on 04/18/2022) 30 tablet 2    senna-docusate (SENOKOT-S) 8.6-50 MG tablet Take 2 tablets by mouth 2 (two) times daily. (Patient not taking: Reported on 04/18/2022) 120 tablet 0   tamsulosin (FLOMAX) 0.4 MG CAPS capsule TAKE 1 CAPSULE BY MOUTH EVERY DAY (Patient not taking: Reported on 04/18/2022) 90 capsule 1   UNABLE TO FIND Blood glucose monitor kit use to check blood glucose twice daily DX: E11.9 (Patient not taking: Reported on  04/18/2022) 1 each 0   UNABLE TO FIND Lancets use to check blood glucose twice daily DX: E11.9 (Patient not taking: Reported on 04/18/2022) 100 each 2   UNABLE TO FIND Blood glucose test strips use to check blood glucose twice daily DX: E11.9 (Patient not taking: Reported on 04/18/2022) 100 each 2   No facility-administered medications prior to visit.    Allergies  Allergen Reactions   Aleve [Naproxen]     ROS Review of Systems  Constitutional: Negative.  Negative for activity change, appetite change and chills.  HENT:  Negative for ear pain, facial swelling, hearing loss and mouth sores.   Respiratory:  Negative for cough, shortness of breath, wheezing and stridor.   Cardiovascular:  Positive for leg swelling. Negative for chest pain and palpitations.  Gastrointestinal: Negative.  Negative for abdominal distention, abdominal pain, blood in stool and constipation.  Endocrine: Negative for cold intolerance, heat intolerance, polydipsia, polyphagia and polyuria.  Psychiatric/Behavioral:  Negative for agitation, behavioral problems and confusion.       Objective:    Physical Exam Constitutional:      General: He is not in acute distress.    Appearance: He is obese. He is not ill-appearing, toxic-appearing or diaphoretic.  Cardiovascular:     Rate and Rhythm: Normal rate and regular rhythm.     Pulses: Normal pulses.     Heart sounds: Normal heart sounds. No murmur heard.    No friction rub. No gallop.  Pulmonary:     Effort: Pulmonary effort is normal. No respiratory distress.      Breath sounds: Normal breath sounds. No stridor. No wheezing, rhonchi or rales.  Chest:     Chest wall: No tenderness.  Abdominal:     Palpations: Abdomen is soft. There is no mass.     Tenderness: There is no abdominal tenderness.  Musculoskeletal:        General: No swelling, tenderness, deformity or signs of injury.     Right lower leg: Edema present.     Left lower leg: Edema present.  Skin:    General: Skin is warm and dry.     Capillary Refill: Capillary refill takes less than 2 seconds.     Coloration: Skin is not jaundiced or pale.     Findings: No bruising, erythema or rash.  Neurological:     Mental Status: He is alert and oriented to person, place, and time.     Cranial Nerves: No cranial nerve deficit.     Sensory: No sensory deficit.     Motor: No weakness.  Psychiatric:        Mood and Affect: Mood normal.        Behavior: Behavior normal.        Thought Content: Thought content normal.        Judgment: Judgment normal.     BP (!) 164/82   Pulse (!) 106   Ht 6' (1.829 m)   Wt (!) 319 lb (144.7 kg)   SpO2 94%   BMI 43.26 kg/m  Wt Readings from Last 3 Encounters:  04/18/22 (!) 319 lb (144.7 kg)  03/09/22 (!) 307 lb (139.3 kg)  08/25/21 (!) 305 lb (138.3 kg)    Lab Results  Component Value Date   TSH 1.208 05/17/2021   Lab Results  Component Value Date   WBC 6.9 03/16/2022   HGB 11.7 (L) 03/16/2022   HCT 37.6 03/16/2022   MCV 79 03/16/2022   PLT 375 03/16/2022  Lab Results  Component Value Date   NA 141 03/16/2022   K 4.3 03/16/2022   CO2 25 03/16/2022   GLUCOSE 114 (H) 03/16/2022   BUN 23 03/16/2022   CREATININE 2.09 (H) 03/16/2022   BILITOT 0.2 03/16/2022   ALKPHOS 54 03/16/2022   AST 17 03/16/2022   ALT 15 03/16/2022   PROT 6.6 03/16/2022   ALBUMIN 3.6 (L) 03/16/2022   CALCIUM 8.5 (L) 03/16/2022   ANIONGAP 5 06/06/2021   EGFR 37 (L) 03/16/2022   Lab Results  Component Value Date   CHOL 147 03/16/2022   Lab Results  Component  Value Date   HDL 48 03/16/2022   Lab Results  Component Value Date   LDLCALC 88 03/16/2022   Lab Results  Component Value Date   TRIG 54 03/16/2022   Lab Results  Component Value Date   CHOLHDL 3.1 03/16/2022   Lab Results  Component Value Date   HGBA1C 5.7 (H) 03/16/2022      Assessment & Plan:   Problem List Items Addressed This Visit       Cardiovascular and Mediastinum   High blood pressure - Primary    BP Readings from Last 3 Encounters:  04/18/22 (!) 164/82  03/09/22 (!) 160/88  08/25/21 (!) 160/110  Uncontrolled condition Currently on amlodipine 10 mg daily, clonidine 0.2 mg 3 times daily, hydralazine 50 mg twice daily.  Start hydralazine 100 mg twice daily. DASH advised, has coming appointment with cardiology, referral to nephrologist is pending Follow-up in 4 weeks Blood Pressure monitor ordered today BP goal is less than 130/80      Relevant Medications   hydrALAZINE (APRESOLINE) 100 MG tablet   cloNIDine (CATAPRES) 0.2 MG tablet     Genitourinary   CKD (chronic kidney disease) stage 3, GFR 30-59 ml/min (HCC)    He is doing well on faxiga 47m daily .  Avoid NSAID and other nephrotoxic agent Referral to nephrologist is pending  Lab Results  Component Value Date   NA 141 03/16/2022   K 4.3 03/16/2022   CO2 25 03/16/2022   GLUCOSE 114 (H) 03/16/2022   BUN 23 03/16/2022   CREATININE 2.09 (H) 03/16/2022   CALCIUM 8.5 (L) 03/16/2022   EGFR 37 (L) 03/16/2022   GFRNONAA 36 (L) 06/06/2021          Other   HLD (hyperlipidemia)    Lab Results  Component Value Date   CHOL 147 03/16/2022   HDL 48 03/16/2022   LDLCALC 88 03/16/2022   TRIG 54 03/16/2022   CHOLHDL 3.1 03/16/2022  Goal is less than 55 due to history of CVA He is not sure he is he has been taking Zetia on patient encouraged to pick up the medication from the pharmacy if he has not already done so and take  10 mg daily Continue atorvastatin 80 mg daily      Relevant Medications    hydrALAZINE (APRESOLINE) 100 MG tablet   cloNIDine (CATAPRES) 0.2 MG tablet   Need for immunization against influenza    Patient educated on CDC recommendation for the flu  vaccine. Verbal consent was obtained from the patient, vaccine administered by nurse, no sign of adverse reactions noted at this time. Patient education on arm soreness and use of tylenol for this patient  was discussed. Patient educated on the signs and symptoms of adverse effect and advise to contact the office if they occur. Vaccine information sheet given to patient.       Relevant  Orders   Flu Vaccine QUAD 14moIM (Fluarix, Fluzone & Alfiuria Quad PF) (Completed)   Bilateral lower extremity edema    His  CKD might be contributing to this  DASH diet advised, keep legs elevated when sitting, use of compression socks encouraged.       Meds ordered this encounter  Medications   hydrALAZINE (APRESOLINE) 100 MG tablet    Sig: Take 1 tablet (100 mg total) by mouth 2 (two) times daily.    Dispense:  60 tablet    Refill:  3   cloNIDine (CATAPRES) 0.2 MG tablet    Sig: Take 1 tablet (0.2 mg total) by mouth 3 (three) times daily.    Dispense:  90 tablet    Refill:  1   UNABLE TO FIND    Sig: Large blood pressure cuff DX: I10    Dispense:  1 each    Refill:  0   UNABLE TO FIND    Sig: Blood glucose monitoring kit used to check blood sugars twice daily DX: E11.8    Dispense:  1 each    Refill:  0   UNABLE TO FIND    Sig: Blood glucose test strips DX: E11.8    Dispense:  100 each    Refill:  2   UNABLE TO FIND    Sig: Lancets to check blood glucose daily    Dispense:  100 each    Refill:  2    Follow-up: Return in about 4 weeks (around 05/16/2022) for HTN.    FRenee Rival FNP

## 2022-04-18 NOTE — Assessment & Plan Note (Signed)
Patient educated on CDC recommendation for the flu  vaccine. Verbal consent was obtained from the patient, vaccine administered by nurse, no sign of adverse reactions noted at this time. Patient education on arm soreness and use of tylenol for this patient  was discussed. Patient educated on the signs and symptoms of adverse effect and advise to contact the office if they occur. Vaccine information sheet given to patient.

## 2022-04-18 NOTE — Assessment & Plan Note (Addendum)
BP Readings from Last 3 Encounters:  04/18/22 (!) 164/82  03/09/22 (!) 160/88  08/25/21 (!) 160/110  Uncontrolled condition Currently on amlodipine 10 mg daily, clonidine 0.2 mg 3 times daily, hydralazine 50 mg twice daily.  Start hydralazine 100 mg twice daily. DASH advised, has coming appointment with cardiology, referral to nephrologist is pending Follow-up in 4 weeks Blood Pressure monitor ordered today BP goal is less than 130/80

## 2022-04-18 NOTE — Assessment & Plan Note (Signed)
His  CKD might be contributing to this  DASH diet advised, keep legs elevated when sitting, use of compression socks encouraged.

## 2022-04-18 NOTE — Assessment & Plan Note (Signed)
He is doing well on faxiga 10mg daily .  Avoid NSAID and other nephrotoxic agent Referral to nephrologist is pending  Lab Results  Component Value Date   NA 141 03/16/2022   K 4.3 03/16/2022   CO2 25 03/16/2022   GLUCOSE 114 (H) 03/16/2022   BUN 23 03/16/2022   CREATININE 2.09 (H) 03/16/2022   CALCIUM 8.5 (L) 03/16/2022   EGFR 37 (L) 03/16/2022   GFRNONAA 36 (L) 06/06/2021   

## 2022-05-02 ENCOUNTER — Ambulatory Visit: Payer: Medicare HMO | Attending: Internal Medicine | Admitting: Internal Medicine

## 2022-05-02 ENCOUNTER — Encounter: Payer: Self-pay | Admitting: Internal Medicine

## 2022-05-02 VITALS — BP 150/80 | HR 95 | Ht 72.0 in | Wt 315.6 lb

## 2022-05-02 DIAGNOSIS — R002 Palpitations: Secondary | ICD-10-CM | POA: Diagnosis not present

## 2022-05-02 DIAGNOSIS — I63011 Cerebral infarction due to thrombosis of right vertebral artery: Secondary | ICD-10-CM

## 2022-05-02 DIAGNOSIS — I10 Essential (primary) hypertension: Secondary | ICD-10-CM

## 2022-05-02 DIAGNOSIS — E782 Mixed hyperlipidemia: Secondary | ICD-10-CM

## 2022-05-02 DIAGNOSIS — R0989 Other specified symptoms and signs involving the circulatory and respiratory systems: Secondary | ICD-10-CM | POA: Diagnosis not present

## 2022-05-02 DIAGNOSIS — E119 Type 2 diabetes mellitus without complications: Secondary | ICD-10-CM

## 2022-05-02 DIAGNOSIS — I422 Other hypertrophic cardiomyopathy: Secondary | ICD-10-CM

## 2022-05-02 MED ORDER — AMLODIPINE BESYLATE 10 MG PO TABS: 10.0000 mg | ORAL_TABLET | Freq: Every day | ORAL | 3 refills | Status: DC

## 2022-05-02 MED ORDER — METOPROLOL SUCCINATE ER 25 MG PO TB24: 25.0000 mg | ORAL_TABLET | Freq: Every day | ORAL | 3 refills | Status: DC

## 2022-05-02 NOTE — Progress Notes (Signed)
Cardiology Office Note:    Date:  05/02/2022   ID:  Brandon French, DOB July 02, 1966, MRN [id number]  PCP:  Donell Beers, FNP   [facility] Providers Cardiologist:  None     Referring MD: Donell Beers, FNP   CC: HCM Consulted for the evaluation of HCM at the behest of Ms. Paseda  History of Present Illness:    Brandon French is a 56 y.o. male with a hx of HTN, HLD, CKD who presents after echo evidence of HCM. He has had high blood pressure since at least 2016 when he had the first of two strokes.Has been diagnosed with DM.    He feels fine. No Chest pain No Shortness of breath. No DOE. No Passing out.  No palpitations.  He denies issues with getting medications. Mother had CHF recent diagnosis (last week). No history of SCD.  BP at home is 150/80.   Past Medical History:  Diagnosis Date   Chronic kidney disease    Diabetes mellitus (HCC)    Elevated cholesterol    High blood pressure     Past Surgical History:  Procedure Laterality Date   TRANSURETHRAL RESECTION OF PROSTATE  2020    Current Medications: Current Meds  Medication Sig   acetaminophen (TYLENOL) 325 MG tablet Take 1-2 tablets (325-650 mg total) by mouth every 4 (four) hours as needed for mild pain.   aspirin 81 MG EC tablet Take 1 tablet (81 mg total) by mouth daily with breakfast. Please take Aspirin 81 mg daily till 11/21 then stop.   ezetimibe (ZETIA) 10 MG tablet Take 1 tablet (10 mg total) by mouth daily.   metoprolol succinate (TOPROL XL) 25 MG 24 hr tablet Take 1 tablet (25 mg total) by mouth daily.   [DISCONTINUED] amLODipine (NORVASC) 10 MG tablet Take 1 tablet (10 mg total) by mouth daily.     Allergies:   Aleve [naproxen]   Social History   Socioeconomic History   Marital status: Divorced    Spouse name: Not on file   Number of children: 2   Years of education: 11   Highest education level: Not on file  Occupational History   Not on file  Tobacco Use    Smoking status: Former    Packs/day: 0.50    Years: 8.00    Total pack years: 4.00    Types: Cigarettes    Quit date: 2011    Years since quitting: 12.7   Smokeless tobacco: Never   Tobacco comments:    Pt stated that he smoked for about 8 years , he quit 2011.  Vaping Use   Vaping Use: Never used  Substance and Sexual Activity   Alcohol use: Never   Drug use: Never   Sexual activity: Not Currently  Other Topics Concern   Not on file  Social History Narrative   Pt lives with his friends.    Social Determinants of Health   Financial Resource Strain: Low Risk  (09/01/2021)   Overall Financial Resource Strain (CARDIA)    Difficulty of Paying Living Expenses: Not hard at all  Food Insecurity: No Food Insecurity (09/01/2021)   Hunger Vital Sign    Worried About Running Out of Food in the Last Year: Never true    Ran Out of Food in the Last Year: Never true  Transportation Needs: No Transportation Needs (09/01/2021)   PRAPARE - Transportation    Lack of Transportation (Medical): No    Lack of  Transportation (Non-Medical): No  Physical Activity: Sufficiently Active (09/01/2021)   Exercise Vital Sign    Days of Exercise per Week: 4 days    Minutes of Exercise per Session: 50 min  Stress: No Stress Concern Present (09/01/2021)   [employer] of Occupational Health - Occupational Stress Questionnaire    Feeling of Stress : Not at all  Social Connections: Socially Isolated (09/01/2021)   Social Connection and Isolation Panel [NHANES]    Frequency of Communication with Friends and Family: More than three times a week    Frequency of Social Gatherings with Friends and Family: Never    Attends Religious Services: Never    Database administrator or Organizations: No    Attends Engineer, structural: Never    Marital Status: Divorced     Family History: The patient's family history includes Cancer in his mother; Colon cancer in his half-brother; Diabetes in his father and mother;  Kidney failure in his father; Lung cancer in his half-brother; Stroke in his mother.  ROS:   Please see the history of present illness.     All other systems reviewed and are negative.  EKGs/Labs/Other Studies Reviewed:    The following studies were reviewed today:  EKG:  EKG is  ordered today.  The ekg ordered today demonstrates  05/02/22: SR LVH, LAFB  ECHO COMPLETE WO IMAGING ENHANCING AGENT 05/18/2021  Narrative ECHOCARDIOGRAM REPORT    Patient Name:   Brandon French Date of Exam: 05/18/2021 Medical Rec #:  [id number]      Height:       72.0 in Accession #:    [id number]     Weight:       319.9 lb Date of Birth:  [birth year]     BSA:          2.601 m Patient Age:    54 years       BP:           169/33 mmHg Patient Gender: M              HR:           82 bpm. [facility]  Procedure: 2D Echo, Cardiac Doppler and Color Doppler  Indications:    Stroke  History:        Patient has no prior history of Echocardiogram examinations. Stroke; Risk Factors:Diabetes and Dyslipidemia.  Sonographer:    Mikki Harbor Referring Phys: [id number] ASIA B ZIERLE-GHOSH  IMPRESSIONS   1. Left ventricular ejection fraction, by estimation, is 65 to 70%. The left ventricle has normal function. The left ventricle has no regional wall motion abnormalities. There is severe asymmetric left ventricular hypertrophy of the septal segment (21 mm). Left ventricular diastolic parameters are indeterminate. No obvious LVOT gradient. Although history of hypertension present, consider elective cardiology referral for evaluation of hypertrophic cardiomyopathy. Non-urgent cardiac MRI can be considered. 2. Right ventricular systolic function is normal. The right ventricular size is normal. Tricuspid regurgitation signal is inadequate for assessing PA pressure. 3. The mitral valve is grossly normal. Trivial mitral valve regurgitation. 4. The aortic valve is tricuspid. Aortic valve regurgitation is  not visualized. No aortic stenosis is present. Aortic valve mean gradient measures 5.0 mmHg. 5. Aortic dilatation noted. There is borderline dilatation of the aortic root, measuring 40 mm. 6. Unable to estimate CVP.  Comparison(s): No prior Echocardiogram.  FINDINGS Left Ventricle: Left ventricular ejection fraction, by estimation, is 65 to 70%. The left ventricle has  normal function. The left ventricle has no regional wall motion abnormalities. The left ventricular internal cavity size was normal in size. There is severe asymmetric left ventricular hypertrophy of the septal segment. Left ventricular diastolic parameters are indeterminate.  Right Ventricle: The right ventricular size is normal. No increase in right ventricular wall thickness. Right ventricular systolic function is normal. Tricuspid regurgitation signal is inadequate for assessing PA pressure.  Left Atrium: Left atrial size was normal in size.  Right Atrium: Right atrial size was normal in size.  Pericardium: There is no evidence of pericardial effusion.  Mitral Valve: The mitral valve is grossly normal. Trivial mitral valve regurgitation. MV peak gradient, 8.1 mmHg. The mean mitral valve gradient is 4.0 mmHg.  Tricuspid Valve: The tricuspid valve is grossly normal. Tricuspid valve regurgitation is trivial.  Aortic Valve: The aortic valve is tricuspid. There is mild aortic valve annular calcification. Aortic valve regurgitation is not visualized. No aortic stenosis is present. Aortic valve mean gradient measures 5.0 mmHg. Aortic valve peak gradient measures 9.6 mmHg. Aortic valve area, by VTI measures 2.86 cm.  Pulmonic Valve: The pulmonic valve was grossly normal. Pulmonic valve regurgitation is trivial.  Aorta: Aortic dilatation noted. There is borderline dilatation of the aortic root, measuring 40 mm.  Venous: Unable to estimate CVP. The inferior vena cava was not well visualized.  IAS/Shunts: No atrial level shunt  detected by color flow Doppler.   LEFT VENTRICLE PLAX 2D LVIDd:         4.50 cm   Diastology LVIDs:         3.10 cm   LV e' medial:    6.20 cm/s LV PW:         1.40 cm   LV E/e' medial:  12.0 LV IVS:        2.10 cm   LV e' lateral:   10.30 cm/s LVOT diam:     2.00 cm   LV E/e' lateral: 7.2 LV SV:         78 LV SV Index:   30 LVOT Area:     3.14 cm   RIGHT VENTRICLE RV Basal diam:  3.80 cm RV Mid diam:    3.20 cm RV S prime:     12.50 cm/s TAPSE (M-mode): 2.4 cm  LEFT ATRIUM             Index        RIGHT ATRIUM           Index LA diam:        3.00 cm 1.15 cm/m   RA Area:     21.40 cm LA Vol (A2C):   82.0 ml 31.52 ml/m  RA Volume:   60.60 ml  23.30 ml/m LA Vol (A4C):   64.2 ml 24.68 ml/m LA Biplane Vol: 77.0 ml 29.60 ml/m AORTIC VALVE                    PULMONIC VALVE AV Area (Vmax):    2.55 cm     PV Vmax:       0.86 m/s AV Area (Vmean):   2.96 cm     PV Peak grad:  3.0 mmHg AV Area (VTI):     2.86 cm AV Vmax:           155.00 cm/s AV Vmean:          97.900 cm/s AV VTI:            0.271 m AV  Peak Grad:      9.6 mmHg AV Mean Grad:      5.0 mmHg LVOT Vmax:         126.00 cm/s LVOT Vmean:        92.100 cm/s LVOT VTI:          0.247 m LVOT/AV VTI ratio: 0.91  AORTA Ao Root diam: 4.00 cm  MITRAL VALVE MV Area (PHT): 2.80 cm    SHUNTS MV Area VTI:   3.17 cm    Systemic VTI:  0.25 m MV Peak grad:  8.1 mmHg    Systemic Diam: 2.00 cm MV Mean grad:  4.0 mmHg MV Vmax:       1.42 m/s MV Vmean:      87.8 cm/s MV Decel Time: 271 msec MV E velocity: 74.10 cm/s MV A velocity: 98.50 cm/s MV E/A ratio:  0.75   Recent Labs: 05/17/2021: TSH 1.208 03/16/2022: ALT 15; BUN 23; Creatinine, Ser 2.09; Hemoglobin 11.7; Platelets 375; Potassium 4.3; Sodium 141  Recent Lipid Panel    Component Value Date/Time   CHOL 147 03/16/2022 1034   TRIG 54 03/16/2022 1034   HDL 48 03/16/2022 1034   CHOLHDL 3.1 03/16/2022 1034   CHOLHDL 4.8 05/17/2021 0408   VLDL 12 05/17/2021  0408   LDLCALC 88 03/16/2022 1034   Risk Assessment/Calculations:     HYPERTENSION CONTROL Vitals:   05/02/22 1553 05/02/22 1630  BP: (!) 152/80 (!) 150/80    The patient's blood pressure is elevated above target today.  In order to address the patient's elevated BP: A new medication was prescribed today.          Physical Exam:    VS:  BP (!) 150/80   Pulse 95   Ht 6' (1.829 m)   Wt (!) 315 lb 9.6 oz (143.2 kg)   SpO2 95%   BMI 42.80 kg/m     Wt Readings from Last 3 Encounters:  05/02/22 (!) 315 lb 9.6 oz (143.2 kg)  04/18/22 (!) 319 lb (144.7 kg)  03/09/22 (!) 307 lb (139.3 kg)    Gen: No distress, Morbid Obesity   Neck: No JVD Cardiac: No Rubs or Gallops, systolic murmur with standing and Valsalva, rarely irregular heart beat, +2 radial pulses Respiratory: Clear to auscultation bilaterally, normal effort, normal  respiratory rate GI: Soft, nontender, non-distended  MS: No  edema;  moves all extremities Integument: Skin feels warm Neuro:  At time of evaluation, alert and oriented to person/place/time Psych: Normal affect, patient feels fine   ASSESSMENT:    1. Suspected cardiomyopathy   2. Palpitations   3. Hypertrophic cardiomyopathy (HCC)   4. Diabetes mellitus with coincident hypertension (HCC)   5. Cerebrovascular accident (CVA) due to thrombosis of right vertebral artery (HCC)   6. Mixed hyperlipidemia    PLAN:    HTN and HCM Irregular heart beat - NYHA I - No resting gradient, he likely has some degree of a provokable gradient - he does not have the ability to do stress test, we do not have amyl nitrate - will start with BB titration (succinate) - I cannot stop his other BP meds because he is still not controlled; will need Pharm D support as BB may not be enough for control (not on coreg as it could increase an LVOT gradient) - will do ziopatch - Will do CMR  Prior stroke HLD - he is on DAPT until 11/21, then plavix daily; this is managed by  his neurologist -  he is on statin and zetia with LDL above goal, Pharm D clinic for bempedoic acid consideration; he denies issues getting his medications  Morbid Obesity - discussed dietary action for weigh loss  3-4 months me or APP    Medication Adjustments/Labs and Tests Ordered: Current medicines are reviewed at length with the patient today.  Concerns regarding medicines are outlined above.  Orders Placed This Encounter  Procedures   MR CARDIAC MORPHOLOGY W WO CONTRAST   AMB Referral to [facility] Pharm-D   LONG TERM MONITOR (3-14 DAYS)   EKG 12-Lead   Meds ordered this encounter  Medications   amLODipine (NORVASC) 10 MG tablet    Sig: Take 1 tablet (10 mg total) by mouth daily.    Dispense:  90 tablet    Refill:  3   metoprolol succinate (TOPROL XL) 25 MG 24 hr tablet    Sig: Take 1 tablet (25 mg total) by mouth daily.    Dispense:  90 tablet    Refill:  3    Patient Instructions  Medication Instructions:  Your physician has recommended you make the following change in your medication:   START: metoprolol succinate (Toprol-XL) 25 mg by mouth once daily *If you need a refill on your cardiac medications before your next appointment, please call your pharmacy*   Lab Work: NONE If you have labs (blood work) drawn today and your tests are completely normal, you will receive your results only by: MyChart Message (if you have MyChart) OR A paper copy in the mail If you have any lab test that is abnormal or we need to change your treatment, we will call you to review the results.   Testing/Procedures: Your physician has requested that you wear a heart monitor.   Your physician has requested that you have a cardiac MRI. Cardiac MRI uses a computer to create images of your heart as its beating, producing both still and moving pictures of your heart and major blood vessels. For further information please visit [email or web address]. Please follow the instruction sheet given  to you today for more information.   Your physician has referred you to the Lipid Clinic.    Follow-Up: At [facility], you and your health needs are our priority.  As part of our continuing mission to provide you with exceptional heart care, we have created designated Provider Care Teams.  These Care Teams include your primary Cardiologist (physician) and Advanced Practice Providers (APPs -  Physician Assistants and Nurse Practitioners) who all work together to provide you with the care you need, when you need it.     Your next appointment:   3-4 month(s)  The format for your next appointment:   In Person  Provider:   Riley Lam, MD    Other Instructions ZIO XT- Long Term Monitor Instructions  Your physician has requested you wear a ZIO patch monitor for 7 days.  This is a single patch monitor. [employer] supplies one patch monitor per enrollment. Additional stickers are not available. Please do not apply patch if you will be having a Nuclear Stress Test,  Echocardiogram, Cardiac CT, MRI, or Chest Xray during the period you would be wearing the  monitor. The patch cannot be worn during these tests. You cannot remove and re-apply the  ZIO XT patch monitor.  Your ZIO patch monitor will be mailed 3 day USPS to your address on file. It may take 3-5 days  to receive your monitor after you have been enrolled.  Once you have received your monitor, please review the enclosed instructions. Your monitor  has already been registered assigning a specific monitor serial # to you.  Billing and Patient Assistance Program Information  We have supplied [employer] with any of your insurance information on file for billing purposes. [employer] offers a sliding scale Patient Assistance Program for patients that do not have  insurance, or whose insurance does not completely cover the cost of the ZIO monitor.  You must apply for the Patient Assistance Program to qualify for this discounted  rate.  To apply, please call [employer] at [phone], select option 4, select option 2, ask to apply for  Patient Assistance Program. Theodore Demark will ask your household income, and how many people  are in your household. They will quote your out-of-pocket cost based on that information.  [employer] will also be able to set up a 76-month, interest-free payment plan if needed.  Applying the monitor   Shave hair from upper left chest.  Hold abrader disc by orange tab. Rub abrader in 40 strokes over the upper left chest as  indicated in your monitor instructions.  Clean area with 4 enclosed alcohol pads. Let dry.  Apply patch as indicated in monitor instructions. Patch will be placed under collarbone on left  side of chest with arrow pointing upward.  Rub patch adhesive wings for 2 minutes. Remove white label marked "1". Remove the white  label marked "2". Rub patch adhesive wings for 2 additional minutes.  While looking in a mirror, press and release button in center of patch. A small green light will  flash 3-4 times. This will be your only indicator that the monitor has been turned on.  Do not shower for the first 24 hours. You may shower after the first 24 hours.  Press the button if you feel a symptom. You will hear a small click. Record Date, Time and  Symptom in the Patient Logbook.  When you are ready to remove the patch, follow instructions on the last 2 pages of Patient  Logbook. Stick patch monitor onto the last page of Patient Logbook.  Place Patient Logbook in the blue and white box. Use locking tab on box and tape box closed  securely. The blue and white box has prepaid postage on it. Please place it in the mailbox as  soon as possible. Your physician should have your test results approximately 7 days after the  monitor has been mailed back to [place].  Call [place] at [phone] if you have questions regarding  your ZIO XT patch monitor. Call them  immediately if you see an orange light blinking on your  monitor.  If your monitor falls off in less than 4 days, contact our Monitor department at [phone].  If your monitor becomes loose or falls off after 4 days call [employer] at [phone] for  suggestions on securing your monitor   Important Information About Sugar         Signed, Werner Lean, MD  05/02/2022 4:39 PM    [place]

## 2022-05-02 NOTE — Patient Instructions (Signed)
Medication Instructions:  Your physician has recommended you make the following change in your medication:   START: metoprolol succinate (Toprol-XL) 25 mg by mouth once daily *If you need a refill on your cardiac medications before your next appointment, please call your pharmacy*   Lab Work: NONE If you have labs (blood work) drawn today and your tests are completely normal, you will receive your results only by: [place] (if you have MyChart) OR A paper copy in the mail If you have any lab test that is abnormal or we need to change your treatment, we will call you to review the results.   Testing/Procedures: Your physician has requested that you wear a heart monitor.   Your physician has requested that you have a cardiac MRI. Cardiac MRI uses a computer to create images of your heart as its beating, producing both still and moving pictures of your heart and major blood vessels. For further information please visit [email or web address] Please follow the instruction sheet given to you today for more information.   Your physician has referred you to the [facility].    Follow-Up: At [facility], you and your health needs are our priority.  As part of our continuing mission to provide you with exceptional heart care, we have created designated Provider Care Teams.  These Care Teams include your primary Cardiologist (physician) and Advanced Practice Providers (APPs -  Physician Assistants and Nurse Practitioners) who all work together to provide you with the care you need, when you need it.     Your next appointment:   3-4 month(s)  The format for your next appointment:   In Person  Provider:   Rudean Haskell, MD    Other Instructions ZIO XT- Long Term Monitor Instructions  Your physician has requested you wear a ZIO patch monitor for 7 days.  This is a single patch monitor. [employer] supplies one patch monitor per enrollment. Additional stickers are not  available. Please do not apply patch if you will be having a Nuclear Stress Test,  Echocardiogram, Cardiac CT, MRI, or Chest Xray during the period you would be wearing the  monitor. The patch cannot be worn during these tests. You cannot remove and re-apply the  ZIO XT patch monitor.  Your ZIO patch monitor will be mailed 3 day USPS to your address on file. It may take 3-5 days  to receive your monitor after you have been enrolled.  Once you have received your monitor, please review the enclosed instructions. Your monitor  has already been registered assigning a specific monitor serial # to you.  Billing and Patient Assistance Program Information  We have supplied [employer] with any of your insurance information on file for billing purposes. [employer] offers a sliding scale Patient Assistance Program for patients that do not have  insurance, or whose insurance does not completely cover the cost of the ZIO monitor.  You must apply for the Patient Assistance Program to qualify for this discounted rate.  To apply, please call [employer] at [phone], select option 4, select option 2, ask to apply for  Patient Assistance Program. Theodore Demark will ask your household income, and how many people  are in your household. They will quote your out-of-pocket cost based on that information.  [employer] will also be able to set up a 14-month, interest-free payment plan if needed.  Applying the monitor   Shave hair from upper left chest.  Hold abrader disc by orange tab. Rub abrader in 40 strokes over  the upper left chest as  indicated in your monitor instructions.  Clean area with 4 enclosed alcohol pads. Let dry.  Apply patch as indicated in monitor instructions. Patch will be placed under collarbone on left  side of chest with arrow pointing upward.  Rub patch adhesive wings for 2 minutes. Remove white label marked "1". Remove the white  label marked "2". Rub patch adhesive wings for 2 additional minutes.   While looking in a mirror, press and release button in center of patch. A small green light will  flash 3-4 times. This will be your only indicator that the monitor has been turned on.  Do not shower for the first 24 hours. You may shower after the first 24 hours.  Press the button if you feel a symptom. You will hear a small click. Record Date, Time and  Symptom in the Patient Logbook.  When you are ready to remove the patch, follow instructions on the last 2 pages of Patient  Logbook. Stick patch monitor onto the last page of Patient Logbook.  Place Patient Logbook in the blue and white box. Use locking tab on box and tape box closed  securely. The blue and white box has prepaid postage on it. Please place it in the mailbox as  soon as possible. Your physician should have your test results approximately 7 days after the  monitor has been mailed back to [facility].  Call [place] at [phone] if you have questions regarding  your ZIO XT patch monitor. Call them immediately if you see an orange light blinking on your  monitor.  If your monitor falls off in less than 4 days, contact our Monitor department at [phone].  If your monitor becomes loose or falls off after 4 days call [employer] at [phone] for  suggestions on securing your monitor   Important Information About Sugar

## 2022-05-03 ENCOUNTER — Ambulatory Visit: Payer: Medicare HMO | Attending: Internal Medicine

## 2022-05-03 DIAGNOSIS — R002 Palpitations: Secondary | ICD-10-CM

## 2022-05-03 DIAGNOSIS — R0989 Other specified symptoms and signs involving the circulatory and respiratory systems: Secondary | ICD-10-CM

## 2022-05-03 NOTE — Progress Notes (Unsigned)
Enrolled for [employer] to mail a ZIO XT long term holter monitor to the patients address on file.  

## 2022-05-16 ENCOUNTER — Encounter: Payer: Self-pay | Admitting: Internal Medicine

## 2022-05-16 ENCOUNTER — Ambulatory Visit (INDEPENDENT_AMBULATORY_CARE_PROVIDER_SITE_OTHER): Payer: Medicare HMO | Admitting: Internal Medicine

## 2022-05-16 VITALS — BP 164/79 | HR 69 | Ht 72.0 in | Wt 317.6 lb

## 2022-05-16 DIAGNOSIS — I10 Essential (primary) hypertension: Secondary | ICD-10-CM

## 2022-05-16 DIAGNOSIS — I639 Cerebral infarction, unspecified: Secondary | ICD-10-CM | POA: Diagnosis not present

## 2022-05-16 MED ORDER — CLOPIDOGREL BISULFATE 75 MG PO TABS: 75.0000 mg | ORAL_TABLET | Freq: Every day | ORAL | 1 refills | Status: DC

## 2022-05-16 NOTE — Patient Instructions (Signed)
It was a pleasure to see you today.  Thank you for giving [place] the opportunity to be involved in your care.  Below is a brief recap of your visit and next steps.  We will plan to see you again in 3 months.  Summary No medication changes today. Continue taking your medicines as prescribed. Please continue checking your blood pressure at home We will follow up in 3 months

## 2022-05-16 NOTE — Progress Notes (Unsigned)
Established Patient Office Visit  Subjective   Patient ID: Brandon French, male    DOB: 02-25-1966  Age: 56 y.o. MRN: [id number]  Chief Complaint  Patient presents with   Follow-up   Brandon French returns to care today.  He is a 56 year old male with a past medical history significant for T2DM, HTN, CKD stage III, BPH, HLD, and prior CVA (October 2022).  He was last seen at [facility] on 9/19 by Brandon Rua, NP.  Hydralazine was increased to 100 mg twice daily at that time.  In the interim he has been seen by cardiology and routine follow-up.  Today [name] states that he feels well.  He has no acute concerns.  He has been taking hydralazine as prescribed and checking his blood pressure at home.  He reports systolic readings consistently 120-130s.  His initial blood pressure was elevated today (164/79), however he attributes this to whitecoat hypertension and being in the doctor's office.  Acute concerns, chronic medical conditions, and outstanding preventative healthcare maintenance items discussed today are individually addressed in A/P below.  Past Medical History:  Diagnosis Date   Chronic kidney disease    Diabetes mellitus (HCC)    Elevated cholesterol    High blood pressure    Past Surgical History:  Procedure Laterality Date   TRANSURETHRAL RESECTION OF PROSTATE  2020   Social History   Tobacco Use   Smoking status: Former    Packs/day: 0.50    Years: 8.00    Total pack years: 4.00    Types: Cigarettes    Quit date: 2011    Years since quitting: 12.8   Smokeless tobacco: Never   Tobacco comments:    Pt stated that he smoked for about 8 years , he quit 2011.  Vaping Use   Vaping Use: Never used  Substance Use Topics   Alcohol use: Never   Drug use: Never   Family History  Problem Relation Age of Onset   Stroke Mother    Diabetes Mother    Cancer Mother    Diabetes Father    Kidney failure Father    Colon cancer Half-Brother    Lung cancer Half-Brother     Allergies  Allergen Reactions   Aleve [Naproxen]    Review of Systems  Constitutional:  Negative for chills and fever.  HENT:  Negative for sore throat.   Respiratory:  Negative for cough and shortness of breath.   Cardiovascular:  Negative for chest pain, palpitations and leg swelling.  Gastrointestinal:  Negative for abdominal pain, blood in stool, constipation, diarrhea, nausea and vomiting.  Genitourinary:  Negative for dysuria and hematuria.  Musculoskeletal:  Negative for myalgias.  Skin:  Negative for itching and rash.  Neurological:  Negative for dizziness and headaches.  Psychiatric/Behavioral:  Negative for depression and suicidal ideas.       Objective:     BP (!) 164/79   Pulse 69   Ht 6' (1.829 m)   Wt (!) 317 lb 9.6 oz (144.1 kg)   SpO2 98%   BMI 43.07 kg/m  BP Readings from Last 3 Encounters:  05/16/22 (!) 164/79  05/02/22 (!) 150/80  04/18/22 (!) 164/82      Physical Exam Vitals reviewed.  Constitutional:      General: He is not in acute distress.    Appearance: Normal appearance. He is obese. He is not ill-appearing.  HENT:     Head: Normocephalic and atraumatic.     Nose: Nose  normal. No congestion or rhinorrhea.     Mouth/Throat:     Mouth: Mucous membranes are moist.     Pharynx: Oropharynx is clear.  Eyes:     Extraocular Movements: Extraocular movements intact.     Conjunctiva/sclera: Conjunctivae normal.     Pupils: Pupils are equal, round, and reactive to light.  Cardiovascular:     Rate and Rhythm: Normal rate and regular rhythm.     Pulses: Normal pulses.     Heart sounds: Murmur heard.  Pulmonary:     Effort: Pulmonary effort is normal.     Breath sounds: Normal breath sounds. No wheezing, rhonchi or rales.  Abdominal:     General: Abdomen is flat. Bowel sounds are normal. There is no distension.     Palpations: Abdomen is soft.     Tenderness: There is no abdominal tenderness.  Musculoskeletal:        General: No deformity.  Normal range of motion.     Cervical back: Normal range of motion.     Right lower leg: Edema present.     Left lower leg: Edema present.  Skin:    General: Skin is warm and dry.     Capillary Refill: Capillary refill takes less than 2 seconds.  Neurological:     General: No focal deficit present.     Mental Status: He is alert and oriented to person, place, and time.     Motor: No weakness.  Psychiatric:        Mood and Affect: Mood normal.        Behavior: Behavior normal.        Thought Content: Thought content normal.    Last CBC Lab Results  Component Value Date   WBC 6.9 03/16/2022   HGB 11.7 (L) 03/16/2022   HCT 37.6 03/16/2022   MCV 79 03/16/2022   MCH 24.4 (L) 03/16/2022   RDW 15.4 03/16/2022   PLT 375 82/50/5397   Last metabolic panel Lab Results  Component Value Date   GLUCOSE 114 (H) 03/16/2022   NA 141 03/16/2022   K 4.3 03/16/2022   CL 104 03/16/2022   CO2 25 03/16/2022   BUN 23 03/16/2022   CREATININE 2.09 (H) 03/16/2022   EGFR 37 (L) 03/16/2022   CALCIUM 8.5 (L) 03/16/2022   PHOS 2.9 05/19/2021   PROT 6.6 03/16/2022   ALBUMIN 3.6 (L) 03/16/2022   LABGLOB 3.0 03/16/2022   AGRATIO 1.2 03/16/2022   BILITOT 0.2 03/16/2022   ALKPHOS 54 03/16/2022   AST 17 03/16/2022   ALT 15 03/16/2022   ANIONGAP 5 06/06/2021   Last lipids Lab Results  Component Value Date   CHOL 147 03/16/2022   HDL 48 03/16/2022   LDLCALC 88 03/16/2022   TRIG 54 03/16/2022   CHOLHDL 3.1 03/16/2022   Last hemoglobin A1c Lab Results  Component Value Date   HGBA1C 5.7 (H) 03/16/2022   Last thyroid functions Lab Results  Component Value Date   TSH 1.208 05/17/2021   Last vitamin B12 and Folate Lab Results  Component Value Date   VITAMINB12 289 05/18/2021     Assessment & Plan:   Problem List Items Addressed This Visit       Hypertension    BP 164/79 today, improved to 140/72 on repeat.  He is checking his blood pressure at home and reports systolic readings  consistently 120-130s.  His current regimen consist of amlodipine 10 mg daily, clonidine 0.2 mg 3 times daily, hydralazine 100 mg  twice daily, and metoprolol succinate 25 mg daily. -No changes today.  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  He was treated with DAPT x1 month (ASA/Plavix) and was instructed to start Plavix monotherapy thereafter.  However, he is currently taking ASA 81 mg daily and is not on Plavix.  We contacted his neurologist, Dr. Merlene Laughter, who confirmed that he should be on Plavix monotherapy instead of ASA.  Accordingly, I have represcribed Plavix 75 mg daily and discontinued ASA 81 mg daily.      Relevant Medications   amLODipine (NORVASC) 10 MG tablet    Return in about 3 months (around 08/16/2022).    Johnette Abraham, MD

## 2022-05-18 DIAGNOSIS — Z0000 Encounter for general adult medical examination without abnormal findings: Secondary | ICD-10-CM | POA: Insufficient documentation

## 2022-05-18 NOTE — Assessment & Plan Note (Signed)
BP 164/79 today, improved to 140/72 on repeat.  He is checking his blood pressure at home and reports systolic readings consistently 120-130s.  His current regimen consist of amlodipine 10 mg daily, clonidine 0.2 mg 3 times daily, hydralazine 100 mg twice daily, and metoprolol succinate 25 mg daily. -No changes today.  We will continue to monitor his blood pressure at follow-up

## 2022-05-18 NOTE — Assessment & Plan Note (Addendum)
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  He was treated with DAPT x1 month (ASA/Plavix) and was instructed to start Plavix monotherapy thereafter.  However, he is currently taking ASA 81 mg daily and is not on Plavix.  We contacted his neurologist, Dr. Merlene Laughter, who confirmed that he should be on Plavix monotherapy instead of ASA.  Accordingly, I have represcribed Plavix 75 mg daily and discontinued ASA 81 mg daily.

## 2022-06-13 ENCOUNTER — Ambulatory Visit: Payer: Medicare HMO

## 2022-06-14 ENCOUNTER — Other Ambulatory Visit: Payer: Self-pay | Admitting: Nurse Practitioner

## 2022-06-14 DIAGNOSIS — D508 Other iron deficiency anemias: Secondary | ICD-10-CM

## 2022-07-17 ENCOUNTER — Other Ambulatory Visit: Payer: Self-pay | Admitting: Nurse Practitioner

## 2022-07-17 DIAGNOSIS — E118 Type 2 diabetes mellitus with unspecified complications: Secondary | ICD-10-CM

## 2022-07-18 ENCOUNTER — Ambulatory Visit: Payer: Medicare HMO

## 2022-07-18 NOTE — Progress Notes (Deleted)
Patient ID: Brandon French                 DOB: 25-Jul-1966                    MRN: [id number]      HPI: Brandon French is a 56 y.o. male patient referred to lipid clinic by  Dr. Gasper Sells. PMH is significant for HTN, HLD, CKD, DM, HCM, CVA x2.    Reviewed options for lowering LDL cholesterol, including ezetimibe, PCSK-9 inhibitors, bempedoic acid and inclisiran.  Discussed mechanisms of action, dosing, side effects and potential decreases in LDL cholesterol.  Also reviewed cost information and potential options for patient assistance.  Current Medications: atorvastatin 65m daily, ezetimibe 162mdaily Intolerances:  Risk Factors: CVA, DM, HTN, premature disease LDL-C goal: <55 ApoB goal: <70 (ideally <60)  Diet:   Exercise:   Family History: The patient's family history includes Cancer in his mother; Colon cancer in his half-brother; Diabetes in his father and mother; Kidney failure in his father; Lung cancer in his half-brother; Stroke in his mother.   Social History:   Labs: Lipid Panel     Component Value Date/Time   CHOL 147 03/16/2022 1034   TRIG 54 03/16/2022 1034   HDL 48 03/16/2022 1034   CHOLHDL 3.1 03/16/2022 1034   CHOLHDL 4.8 05/17/2021 0408   VLDL 12 05/17/2021 0408   LDLCALC 88 03/16/2022 1034   LABVLDL 11 03/16/2022 1034    Past Medical History:  Diagnosis Date   Chronic kidney disease    Diabetes mellitus (HCEvart   Elevated cholesterol    High blood pressure     Current Outpatient Medications on File Prior to Visit  Medication Sig Dispense Refill   amLODipine (NORVASC) 10 MG tablet Take 10 mg by mouth daily.     atorvastatin (LIPITOR) 80 MG tablet TAKE 1 TABLET BY MOUTH EVERY DAY 90 tablet 0   cloNIDine (CATAPRES) 0.2 MG tablet Take 1 tablet (0.2 mg total) by mouth 3 (three) times daily. 90 tablet 1   clopidogrel (PLAVIX) 75 MG tablet Take 1 tablet (75 mg total) by mouth daily. 30 tablet 1   CVS IRON 325 (65 Fe) MG tablet TAKE 1 TABLET BY MOUTH  EVERY DAY 90 tablet 1   dapagliflozin propanediol (FARXIGA) 10 MG TABS tablet Take 1 tablet (10 mg total) by mouth daily before breakfast. (Patient not taking: Reported on 05/16/2022) 90 tablet 0   ezetimibe (ZETIA) 10 MG tablet Take 1 tablet (10 mg total) by mouth daily. 90 tablet 3   finasteride (PROSCAR) 5 MG tablet Take 1 tablet (5 mg total) by mouth daily. (Patient not taking: Reported on 05/02/2022) 30 tablet 1   gabapentin (NEURONTIN) 100 MG capsule Take 1 capsule (100 mg total) by mouth at bedtime. (Patient not taking: Reported on 05/16/2022) 30 capsule 1   glimepiride (AMARYL) 1 MG tablet TAKE 1 TABLET BY MOUTH EVERY DAY WITH BREAKFAST 90 tablet 1   hydrALAZINE (APRESOLINE) 100 MG tablet Take 1 tablet (100 mg total) by mouth 2 (two) times daily. 60 tablet 3   metoprolol succinate (TOPROL XL) 25 MG 24 hr tablet Take 1 tablet (25 mg total) by mouth daily. 90 tablet 3   tamsulosin (FLOMAX) 0.4 MG CAPS capsule TAKE 1 CAPSULE BY MOUTH EVERY DAY (Patient not taking: Reported on 04/18/2022) 90 capsule 1   traZODone (DESYREL) 50 MG tablet Take 1 tablet (50 mg total) by mouth at bedtime. 30 tablet  1   UNABLE TO FIND Blood glucose monitor kit use to check blood glucose twice daily DX: E11.9 1 each 0   UNABLE TO FIND Lancets use to check blood glucose twice daily DX: E11.9 100 each 2   UNABLE TO FIND Blood glucose test strips use to check blood glucose twice daily DX: E11.9 100 each 2   UNABLE TO FIND Large blood pressure cuff DX: I10 1 each 0   No current facility-administered medications on file prior to visit.    Allergies  Allergen Reactions   Aleve [Naproxen]     Assessment/Plan:  1. Hyperlipidemia -  No problem-specific Assessment & Plan notes found for this encounter.  _0 @  {f/u with PharmD:28259}  Thank you,  Ramond Dial, Pharm.D, BCPS, CPP [facility] of [facility] [address] [place], [place] [address]  Phone:  [phone]; Fax: [phone]

## 2022-07-28 ENCOUNTER — Telehealth (HOSPITAL_COMMUNITY): Payer: Self-pay | Admitting: *Deleted

## 2022-07-28 NOTE — Telephone Encounter (Signed)
Reaching out to patient to offer assistance regarding upcoming cardiac imaging study; pt verbalizes understanding of appt date/time. He states he doesn't have a ride to make it to the cardiac MIR appointment. I informed him that it may take a few months for [place] to get him back on the scheduled and he verbalized understanding.  His MRI appt for Jan 2 has been canceled, and I will reach out the scheduling team to get the patient rescheduled.  Larey Brick RN Navigator Cardiac Imaging [facility] [phone] office [phone] cell

## 2022-08-01 ENCOUNTER — Ambulatory Visit (HOSPITAL_COMMUNITY): Admission: RE | Admit: 2022-08-01 | Payer: Medicare HMO | Source: Ambulatory Visit

## 2022-08-17 ENCOUNTER — Ambulatory Visit: Payer: Medicare HMO | Admitting: Internal Medicine

## 2022-08-29 ENCOUNTER — Ambulatory Visit: Payer: Medicare HMO | Attending: Internal Medicine | Admitting: Pharmacist

## 2022-08-29 VITALS — BP 148/80 | HR 69

## 2022-08-29 DIAGNOSIS — I10 Essential (primary) hypertension: Secondary | ICD-10-CM

## 2022-08-29 DIAGNOSIS — E785 Hyperlipidemia, unspecified: Secondary | ICD-10-CM | POA: Diagnosis not present

## 2022-08-29 DIAGNOSIS — N1832 Chronic kidney disease, stage 3b: Secondary | ICD-10-CM

## 2022-08-29 MED ORDER — CLONIDINE HCL 0.3 MG PO TABS: 0.3000 mg | ORAL_TABLET | Freq: Two times a day (BID) | ORAL | 3 refills | Status: DC

## 2022-08-29 MED ORDER — DAPAGLIFLOZIN PROPANEDIOL 10 MG PO TABS: 10.0000 mg | ORAL_TABLET | Freq: Every day | ORAL | 0 refills | Status: DC

## 2022-08-29 NOTE — Assessment & Plan Note (Signed)
Assessment: Blood pressure above goal in clinic and appears at home as well.  Although patient reported to PCP previously that his blood pressures were 120s to 130s as reported to me 30s to 140s. Blood pressure in clinic is elevated He takes his hydralazine twice a day at the same time he takes his clonidine and reports compliance with his medications Had a hard time remembering to take clonidine or hydralazine 3 times a day therefore he only takes twice a day Want to avoid using beta-blockers with alpha activity due to HCM.  Cannot use ACE due to kidney function also would avoid diuretic  Plan: Increase clonidine to 0.3 mg twice a day Continue hydralazine 100 mg twice daily, amlodipine 10 mg daily and metoprolol 25 mg daily He has follow-up with Dr. Gasper Sells 2/15 Possibility that some of his swelling is due to amlodipine but due to his limited blood pressure options and the fact that he says it is not painful would not change at this time

## 2022-08-29 NOTE — Assessment & Plan Note (Signed)
Assessment: LDL-C is above goal of less than 55 due to premature CVA Patient reports compliance with atorvastatin 80 mg daily and ezetimibe 10 mg daily He reports that his diet is improved from the past.  He is trying to eat more salads and vegetables and drink more water He used to be over 400 pounds now 317 Encouraged increasing physical activity Discussed medication options, feel as though PCSK9 would be the most effective.  Reviewed injection technique  Plan: Patient is agreeable to try PCSK9 inhibitor I will submit a prior authorization for Repatha Continue atorvastatin 80 mg daily and ezetimibe 10 mg daily I will call patient once approved by insurance and I sent the prescription to his pharmacy.  He is to let us know if there is any cost issues

## 2022-08-29 NOTE — Progress Notes (Signed)
Patient ID: Brandon French                 DOB: 12/17/1965                    MRN: [id number]      HPI: Brandon French is a 57 y.o. male patient referred to lipid clinic by  Dr. Gasper Sells. PMH is significant for HTN, HLD, CKD, CVA x2, suspected HCM.LDL-C in August was 88.    Patient presents today to Pharm.D. clinic.  He reports his blood pressure at home running between the 130s and 140s/ 80s.  Although he states he has not checked in about a week.  He has been taking clonidine just twice a day along with his hydralazine twice a day.  Not sure if he is taking [place].  He states he does not pay for any of his medications.  We reviewed the importance of blood pressure control cholesterol control diet and exercise on his ASCVD risk.  Reviewed options for lowering LDL cholesterol, PCSK-9 inhibitors. Discussed dosing, side effects and potential decreases in LDL cholesterol.  Also reviewed cost information.  Per patient he does not pay anything for his [place].  Patient's right foot is visibly swollen.  Patient states it has been like that for at least 10 months.  Patient states it is not painful.  He has not tried elevating it.  States compression stockings are too tight.  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  Social History: former smoker (quit 10 years ago), no alcohol, no illicit drugs  Labs: Lipid Panel     Component Value Date/Time   CHOL 147 03/16/2022 1034   TRIG 54 03/16/2022 1034   HDL 48 03/16/2022 1034   CHOLHDL 3.1 03/16/2022 1034   CHOLHDL 4.8 05/17/2021 0408   VLDL 12  05/17/2021 0408   LDLCALC 88 03/16/2022 1034   LABVLDL 11 03/16/2022 1034    Past Medical History:  Diagnosis Date   Chronic kidney disease    Diabetes mellitus ([place])    Elevated cholesterol    High blood pressure     Current Outpatient Medications on File Prior to Visit  Medication Sig Dispense Refill   amLODipine (NORVASC) 10 MG tablet Take 10 mg by mouth daily.     atorvastatin (LIPITOR) 80 MG tablet TAKE 1 TABLET BY MOUTH EVERY DAY 90 tablet 0   clopidogrel (PLAVIX) 75 MG tablet Take 1 tablet (75 mg total) by mouth daily. 30 tablet 1   ezetimibe (ZETIA) 10 MG tablet Take 1 tablet (10 mg total) by mouth daily. 90 tablet 3   glimepiride (AMARYL) 1 MG tablet TAKE 1 TABLET BY MOUTH EVERY DAY WITH BREAKFAST 90 tablet 1   hydrALAZINE (APRESOLINE) 100 MG tablet TAKE 1 TABLET BY MOUTH TWICE A DAY 180 tablet 1   metoprolol succinate (TOPROL XL) 25 MG 24 hr tablet Take 1 tablet (25 mg total) by mouth daily. 90 tablet 3   traZODone (DESYREL) 50 MG tablet Take 1 tablet (50 mg total) by mouth at bedtime. 30 tablet 1   CVS IRON 325 (65 Fe) MG tablet TAKE  1 TABLET BY MOUTH EVERY DAY (Patient not taking: Reported on 08/29/2022) 90 tablet 1   finasteride (PROSCAR) 5 MG tablet Take 1 tablet (5 mg total) by mouth daily. (Patient not taking: Reported on 05/02/2022) 30 tablet 1   gabapentin (NEURONTIN) 100 MG capsule Take 1 capsule (100 mg total) by mouth at bedtime. (Patient not taking: Reported on 05/16/2022) 30 capsule 1   tamsulosin (FLOMAX) 0.4 MG CAPS capsule TAKE 1 CAPSULE BY MOUTH EVERY DAY (Patient not taking: Reported on 04/18/2022) 90 capsule 1   UNABLE TO FIND Blood glucose monitor kit use to check blood glucose twice daily DX: E11.9 1 each 0   UNABLE TO FIND Lancets use to check blood glucose twice daily DX: E11.9 100 each 2   UNABLE TO FIND Blood glucose test strips use to check blood glucose twice daily DX: E11.9 100 each 2   UNABLE TO FIND Large blood pressure cuff DX: I10 1 each 0   No  current facility-administered medications on file prior to visit.    Allergies  Allergen Reactions   Aleve [Naproxen]     Assessment/Plan:  1. Hyperlipidemia -  Hypertension Assessment: Blood pressure above goal in clinic and appears at home as well.  Although patient reported to PCP previously that his blood pressures were 120s to 130s as reported to me 30s to 140s. Blood pressure in clinic is elevated He takes his hydralazine twice a day at the same time he takes his clonidine and reports compliance with his medications Had a hard time remembering to take clonidine or hydralazine 3 times a day therefore he only takes twice a day Want to avoid using beta-blockers with alpha activity due to HCM.  Cannot use ACE due to kidney function also would avoid diuretic  Plan: Increase clonidine to 0.3 mg twice a day Continue hydralazine 100 mg twice daily, amlodipine 10 mg daily and metoprolol 25 mg daily He has follow-up with Dr. Gasper Sells 2/15 Possibility that some of his swelling is due to amlodipine but due to his limited blood pressure options and the fact that he says it is not painful would not change at this time  Dyslipidemia Assessment: LDL-C is above goal of less than 55 due to premature CVA Patient reports compliance with atorvastatin 80 mg daily and ezetimibe 10 mg daily He reports that his diet is improved from the past.  He is trying to eat more salads and vegetables and drink more water He used to be over 400 pounds now 317 Encouraged increasing physical activity Discussed medication options, feel as though PCSK9 would be the most effective.  Reviewed injection technique  Plan: Patient is agreeable to try PCSK9 inhibitor I will submit a prior authorization for Repatha Continue atorvastatin 80 mg daily and ezetimibe 10 mg daily I will call patient once approved by insurance and I sent the prescription to his pharmacy.  He is to let us know if there is any cost  issues   Thank you,  Ramond Dial, Pharm.D, BCPS, CPP [facility] [address], [place], [place] [address]  Phone: [phone]; Fax: [phone]

## 2022-08-29 NOTE — Patient Instructions (Addendum)
Please increase you clonidine to 0.3mg  twice a day. If you think you can split the 0.2mg  tablets you can take 1.5 tablets, if not I sent a prescription to the pharmacy for the 0.3mg  tablets.   Continue hydralazine 100mg  twice a day, amlodipine 10mg  daily and metoprolol 25mg  daily  Please check your blood pressure at home and bring the readings with you to your next appointment.  I will submit a prior authorization for Repatha. I will call you once I hear back. Please call me at [phone] with any questions.   Repatha is a cholesterol medication that improved your body's ability to get rid of "bad cholesterol" known as LDL. It can lower your LDL up to 60%! It is an injection that is given under the skin every 2 weeks. The medication often requires a prior authorization from your insurance company. We will take care of submitting all the necessary information to your insurance company to get it approved. The most common side effects of Repatha include runny nose, symptoms of the common cold, rarely flu or flu-like symptoms, back/muscle pain in about 3-4% of the patients, and redness, pain, or bruising at the injection site. Tell your healthcare provider if you have any side effect that bothers you or that does not go away.

## 2022-08-31 ENCOUNTER — Ambulatory Visit: Payer: Medicare HMO | Admitting: Internal Medicine

## 2022-08-31 ENCOUNTER — Telehealth: Payer: Self-pay | Admitting: Pharmacist

## 2022-08-31 DIAGNOSIS — I639 Cerebral infarction, unspecified: Secondary | ICD-10-CM

## 2022-08-31 DIAGNOSIS — E785 Hyperlipidemia, unspecified: Secondary | ICD-10-CM

## 2022-08-31 MED ORDER — REPATHA SURECLICK 140 MG/ML ~~LOC~~ SOAJ: 1.0000 mL | SUBCUTANEOUS | 11 refills | Status: DC

## 2022-08-31 NOTE — Telephone Encounter (Signed)
PA for Repatha approved through 07/31/23. Patient made aware. Labs scheduled for 4/11 and Rx send to pharmacy.

## 2022-08-31 NOTE — Telephone Encounter (Signed)
PA for Repatha submitted Key: [id number]

## 2022-09-01 ENCOUNTER — Other Ambulatory Visit: Payer: Self-pay

## 2022-09-01 ENCOUNTER — Ambulatory Visit (INDEPENDENT_AMBULATORY_CARE_PROVIDER_SITE_OTHER): Payer: Medicare HMO | Admitting: Internal Medicine

## 2022-09-01 ENCOUNTER — Encounter: Payer: Self-pay | Admitting: Internal Medicine

## 2022-09-01 VITALS — BP 185/98 | HR 104 | Ht 72.0 in | Wt 327.0 lb

## 2022-09-01 DIAGNOSIS — Z1159 Encounter for screening for other viral diseases: Secondary | ICD-10-CM | POA: Diagnosis not present

## 2022-09-01 DIAGNOSIS — E785 Hyperlipidemia, unspecified: Secondary | ICD-10-CM

## 2022-09-01 DIAGNOSIS — Z1329 Encounter for screening for other suspected endocrine disorder: Secondary | ICD-10-CM | POA: Diagnosis not present

## 2022-09-01 DIAGNOSIS — I10 Essential (primary) hypertension: Secondary | ICD-10-CM | POA: Diagnosis not present

## 2022-09-01 DIAGNOSIS — E119 Type 2 diabetes mellitus without complications: Secondary | ICD-10-CM

## 2022-09-01 DIAGNOSIS — E1122 Type 2 diabetes mellitus with diabetic chronic kidney disease: Secondary | ICD-10-CM

## 2022-09-01 DIAGNOSIS — I639 Cerebral infarction, unspecified: Secondary | ICD-10-CM | POA: Diagnosis not present

## 2022-09-01 DIAGNOSIS — N1832 Chronic kidney disease, stage 3b: Secondary | ICD-10-CM

## 2022-09-01 MED ORDER — CLOPIDOGREL BISULFATE 75 MG PO TABS: 75.0000 mg | ORAL_TABLET | Freq: Every day | ORAL | 1 refills | Status: DC

## 2022-09-01 MED ORDER — DAPAGLIFLOZIN PROPANEDIOL 10 MG PO TABS: 10.0000 mg | ORAL_TABLET | Freq: Every day | ORAL | 1 refills | Status: DC

## 2022-09-01 NOTE — Patient Instructions (Addendum)
It was a pleasure to see you today.  Thank you for giving [place] the opportunity to be involved in your care.  Below is a brief recap of your visit and next steps.  We will plan to see you again in 6 weeks.  Summary No medication changes today I have refilled Farxiga and Plavix Please come in for labs the week of February 16. We will follow up in 6 weeks

## 2022-09-01 NOTE — Assessment & Plan Note (Signed)
A1c 5.7 when updated in August 2023.  He is currently prescribed glimepiride and [place].  Appears he has recently been out of Farxiga. -Farxiga has been refilled today -Repeat A1c later this month

## 2022-09-01 NOTE — Assessment & Plan Note (Signed)
CKD 3B.  Previously referred to nephrology in August 2023 but has not establish care.  We will follow-up on the status of this referral today.  Repeat labs been ordered.

## 2022-09-01 NOTE — Assessment & Plan Note (Signed)
One time HCV screening ordered today 

## 2022-09-01 NOTE — Assessment & Plan Note (Signed)
Prior history of posterior limb of left internal capsule/lateral thalamus CVA in October 2022.  He is currently on Plavix monotherapy.  No longer followed by neurology. -No changes today.  Continue Plavix

## 2022-09-01 NOTE — Assessment & Plan Note (Signed)
His blood pressure remains significantly elevated today.  178/87 initially and 185/98 on repeat.  He continues to report home readings with systolic pressures ranging 130-140 mmHg.  He is currently prescribed hydralazine 100 mg twice daily, amlodipine 10 mg daily, metoprolol succinate 25 mg daily, and clonidine 0.3 mg twice daily.  Clonidine was increased earlier this week at cardiology Pharm.D. clinic. -No additional changes today -He has cardiology follow-up scheduled for 2/15 -I have asked that he keep a home BP log to bring to that appointment

## 2022-09-01 NOTE — Assessment & Plan Note (Signed)
Managed per cardiology Pharm.D. clinic.  He is currently prescribed atorvastatin 80 mg daily, ezetimibe 10 mg daily, and Repatha was prescribed earlier this week.  He reports today that he has been approved and will likely administer his first injection next week.

## 2022-09-01 NOTE — Progress Notes (Signed)
Established Patient Office Visit  Subjective   Patient ID: Brandon French, male    DOB: 09-Dec-1965  Age: 57 y.o. MRN: [id number]  Chief Complaint  Patient presents with   Hypertension    Follow up   Brandon French returns to care today.  He was last seen by me on 05/16/22 for routine follow-up.  At that time it was determined that he was taking ASA 81 mg daily instead of Plavix monotherapy.  ASA was discontinued and Plavix was represcribed.  In the interim he has been seen in the cardiology Pharm.D. clinic earlier this week.  Clonidine was increased to 0.3 mg twice daily for improved HTN control.  Repatha was also added for treatment of dyslipidemia.  There have otherwise been no acute interval events.  Brandon French reports feeling well today.  He has no acute concerns to discuss.  He endorses chronic bilateral lower extremity edema but is otherwise asymptomatic.  Past Medical History:  Diagnosis Date   Chronic kidney disease    Diabetes mellitus (HCC)    Elevated cholesterol    High blood pressure    Past Surgical History:  Procedure Laterality Date   TRANSURETHRAL RESECTION OF PROSTATE  2020   Social History   Tobacco Use   Smoking status: Former    Packs/day: 0.50    Years: 8.00    Total pack years: 4.00    Types: Cigarettes    Quit date: 2011    Years since quitting: 13.0   Smokeless tobacco: Never   Tobacco comments:    Pt stated that he smoked for about 8 years , he quit 2011.  Vaping Use   Vaping Use: Never used  Substance Use Topics   Alcohol use: Never   Drug use: Never   Family History  Problem Relation Age of Onset   Stroke Mother    Diabetes Mother    Cancer Mother    Diabetes Father    Kidney failure Father    Colon cancer Half-Brother    Lung cancer Half-Brother    Allergies  Allergen Reactions   Aleve [Naproxen]       Review of Systems  Constitutional:  Negative for chills and fever.  HENT:  Negative for sore throat.   Respiratory:  Negative  for cough and shortness of breath.   Cardiovascular:  Positive for leg swelling. Negative for chest pain and palpitations.  Gastrointestinal:  Negative for abdominal pain, blood in stool, constipation, diarrhea, nausea and vomiting.  Genitourinary:  Negative for dysuria and hematuria.  Musculoskeletal:  Negative for myalgias.  Skin:  Negative for itching and rash.  Neurological:  Negative for dizziness and headaches.  Psychiatric/Behavioral:  Negative for depression and suicidal ideas.      Objective:     BP (!) 185/98   Pulse (!) 104   Ht 6' (1.829 m)   Wt (!) 327 lb (148.3 kg)   SpO2 91%   BMI 44.35 kg/m  BP Readings from Last 3 Encounters:  09/01/22 (!) 185/98  08/29/22 (!) 148/80  05/16/22 (!) 164/79   Physical Exam Vitals reviewed.  Constitutional:      General: He is not in acute distress.    Appearance: Normal appearance. He is obese. He is not ill-appearing.  HENT:     Head: Normocephalic and atraumatic.     Nose: Nose normal. No congestion or rhinorrhea.     Mouth/Throat:     Mouth: Mucous membranes are moist.     Pharynx:  Oropharynx is clear.  Eyes:     Extraocular Movements: Extraocular movements intact.     Conjunctiva/sclera: Conjunctivae normal.     Pupils: Pupils are equal, round, and reactive to light.  Cardiovascular:     Rate and Rhythm: Normal rate and regular rhythm.     Pulses: Normal pulses.     Heart sounds: Murmur heard.  Pulmonary:     Effort: Pulmonary effort is normal.     Breath sounds: Normal breath sounds. No wheezing, rhonchi or rales.  Abdominal:     General: Abdomen is flat. Bowel sounds are normal. There is no distension.     Palpations: Abdomen is soft.     Tenderness: There is no abdominal tenderness.  Musculoskeletal:        General: No deformity. Normal range of motion.     Cervical back: Normal range of motion.     Right lower leg: Edema present.     Left lower leg: Edema present.  Skin:    General: Skin is warm and dry.      Capillary Refill: Capillary refill takes less than 2 seconds.  Neurological:     General: No focal deficit present.     Mental Status: He is alert and oriented to person, place, and time.     Motor: No weakness.  Psychiatric:        Mood and Affect: Mood normal.        Behavior: Behavior normal.        Thought Content: Thought content normal.   Last CBC Lab Results  Component Value Date   WBC 6.9 03/16/2022   HGB 11.7 (L) 03/16/2022   HCT 37.6 03/16/2022   MCV 79 03/16/2022   MCH 24.4 (L) 03/16/2022   RDW 15.4 03/16/2022   PLT 375 74/03/1447   Last metabolic panel Lab Results  Component Value Date   GLUCOSE 114 (H) 03/16/2022   NA 141 03/16/2022   K 4.3 03/16/2022   CL 104 03/16/2022   CO2 25 03/16/2022   BUN 23 03/16/2022   CREATININE 2.09 (H) 03/16/2022   EGFR 37 (L) 03/16/2022   CALCIUM 8.5 (L) 03/16/2022   PHOS 2.9 05/19/2021   PROT 6.6 03/16/2022   ALBUMIN 3.6 (L) 03/16/2022   LABGLOB 3.0 03/16/2022   AGRATIO 1.2 03/16/2022   BILITOT 0.2 03/16/2022   ALKPHOS 54 03/16/2022   AST 17 03/16/2022   ALT 15 03/16/2022   ANIONGAP 5 06/06/2021   Last lipids Lab Results  Component Value Date   CHOL 147 03/16/2022   HDL 48 03/16/2022   LDLCALC 88 03/16/2022   TRIG 54 03/16/2022   CHOLHDL 3.1 03/16/2022   Last hemoglobin A1c Lab Results  Component Value Date   HGBA1C 5.7 (H) 03/16/2022   Last thyroid functions Lab Results  Component Value Date   TSH 1.208 05/17/2021   Last vitamin B12 and Folate Lab Results  Component Value Date   VITAMINB12 289 05/18/2021     Assessment & Plan:   Problem List Items Addressed This Visit       Hypertension - Primary    His blood pressure remains significantly elevated today.  178/87 initially and 185/98 on repeat.  He continues to report home readings with systolic pressures ranging 130-140 mmHg.  He is currently prescribed hydralazine 100 mg twice daily, amlodipine 10 mg daily, metoprolol succinate 25 mg daily,  and clonidine 0.3 mg twice daily.  Clonidine was increased earlier this week at cardiology Pharm.D. clinic. 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  He is currently on Plavix monotherapy.  No longer followed by neurology. -No changes today.  Continue Plavix       Diabetes mellitus with coincident hypertension ([place])    A1c 5.7 when updated in August 2023.  He is currently prescribed glimepiride and [place].  Appears he has recently been out of Farxiga. -Farxiga has been refilled today -Repeat A1c later this month      CKD (chronic kidney disease) stage 3, GFR 30-59 ml/min (HCC)    CKD 3B.  Previously referred to nephrology in August 2023 but has not establish care.  We will follow-up on the status of this referral today.  Repeat labs been ordered.      Dyslipidemia    Managed per cardiology Pharm.D. clinic.  He is currently prescribed atorvastatin 80 mg daily, ezetimibe 10 mg daily, and Repatha was prescribed earlier this week.  He reports today that he has been approved and will likely administer his first injection next week.      Encounter for HCV screening test for low risk patient    One-time HCV screening ordered today      Return in about 6 weeks (around 10/13/2022).    Johnette Abraham, MD

## 2022-09-04 ENCOUNTER — Encounter: Payer: Self-pay | Admitting: Internal Medicine

## 2022-09-04 ENCOUNTER — Ambulatory Visit: Payer: Medicare HMO

## 2022-09-14 ENCOUNTER — Encounter: Payer: Self-pay | Admitting: Internal Medicine

## 2022-09-14 ENCOUNTER — Ambulatory Visit: Payer: Medicare HMO | Attending: Internal Medicine | Admitting: Internal Medicine

## 2022-09-14 ENCOUNTER — Other Ambulatory Visit (HOSPITAL_COMMUNITY): Payer: Self-pay

## 2022-09-14 ENCOUNTER — Telehealth: Payer: Self-pay | Admitting: Pharmacist

## 2022-09-14 VITALS — BP 128/70 | HR 53 | Ht 72.0 in | Wt 322.0 lb

## 2022-09-14 DIAGNOSIS — I10 Essential (primary) hypertension: Secondary | ICD-10-CM | POA: Diagnosis not present

## 2022-09-14 DIAGNOSIS — I422 Other hypertrophic cardiomyopathy: Secondary | ICD-10-CM | POA: Diagnosis not present

## 2022-09-14 DIAGNOSIS — I639 Cerebral infarction, unspecified: Secondary | ICD-10-CM | POA: Diagnosis not present

## 2022-09-14 DIAGNOSIS — N1832 Chronic kidney disease, stage 3b: Secondary | ICD-10-CM | POA: Diagnosis not present

## 2022-09-14 MED ORDER — AMLODIPINE BESYLATE 5 MG PO TABS: 5.0000 mg | ORAL_TABLET | Freq: Every day | ORAL | 3 refills | Status: DC

## 2022-09-14 MED ORDER — FUROSEMIDE 20 MG PO TABS: 20.0000 mg | ORAL_TABLET | Freq: Every day | ORAL | 3 refills | Status: DC

## 2022-09-14 NOTE — Telephone Encounter (Signed)
Patient approved for [employer] but requires medicare verification. No recent [employer] card on file. I have uploaded a screen shot from Epic of his [employer]. Will try to contact patient for picture of his card. Was unable to get ahold of him earlier today.

## 2022-09-14 NOTE — Progress Notes (Signed)
Cardiology Office Note:    Date:  09/14/2022   ID:  Brandon French, DOB 1966-04-23, MRN [id number]  PCP:  Johnette Abraham, MD   [place] Providers Cardiologist:  None     Referring MD: Renee Rival, FNP   CC: HCM  History of Present Illness:    Brandon French is a 57 y.o. male with a hx of HTN, HLD, CKD who presents after echo evidence of HCM. He has had high blood pressure since at least 2016 when he had the first of two strokes.Has been diagnosed with DM.   2023; Did not get CMR.Paperwork done for [employer] dietary changes.  No heart monitor no MRI (pending for 11/02/2022).   Patient notes that he is doing well.   There are no interval hospital/ED visit.    No chest pain or pressure .  No SOB/DOE and no PND/Orthopnea.  No weight gain or leg swelling.  No palpitations or syncope.  No family history of SCD.  Mother just diagnosed with CAD.  Has two kids.  Has a son and daughter.  Son lives in [place], [place] lives in [place].    Past Medical History:  Diagnosis Date   Chronic kidney disease    Diabetes mellitus (HCC)    Elevated cholesterol    High blood pressure     Past Surgical History:  Procedure Laterality Date   TRANSURETHRAL RESECTION OF PROSTATE  2020    Current Medications: Current Meds  Medication Sig   amLODipine (NORVASC) 5 MG tablet Take 1 tablet (5 mg total) by mouth daily.   atorvastatin (LIPITOR) 80 MG tablet TAKE 1 TABLET BY MOUTH EVERY DAY   cloNIDine (CATAPRES) 0.3 MG tablet Take 1 tablet (0.3 mg total) by mouth 2 (two) times daily.   clopidogrel (PLAVIX) 75 MG tablet Take 1 tablet (75 mg total) by mouth daily.   [employer] IRON 325 (65 Fe) MG tablet TAKE 1 TABLET BY MOUTH EVERY DAY   dapagliflozin propanediol (FARXIGA) 10 MG TABS tablet Take 1 tablet (10 mg total) by mouth daily before breakfast.   Evolocumab (REPATHA SURECLICK) [id number] MG/ML SOAJ Inject 140 mg into the skin every 14 (fourteen) days.   ezetimibe (ZETIA) 10 MG tablet  Take 1 tablet (10 mg total) by mouth daily.   furosemide (LASIX) 20 MG tablet Take 1 tablet (20 mg total) by mouth daily.   glimepiride (AMARYL) 1 MG tablet TAKE 1 TABLET BY MOUTH EVERY DAY WITH BREAKFAST   hydrALAZINE (APRESOLINE) 100 MG tablet TAKE 1 TABLET BY MOUTH TWICE A DAY   metoprolol succinate (TOPROL XL) 25 MG 24 hr tablet Take 1 tablet (25 mg total) by mouth daily.   UNABLE TO FIND Blood glucose monitor kit use to check blood glucose twice daily DX: E11.9   UNABLE TO FIND Lancets use to check blood glucose twice daily DX: E11.9   UNABLE TO FIND Blood glucose test strips use to check blood glucose twice daily DX: E11.9   UNABLE TO FIND Large blood pressure cuff DX: I10   [DISCONTINUED] amLODipine (NORVASC) 10 MG tablet Take 10 mg by mouth daily.     Allergies:   Aleve [naproxen]   Social History   Socioeconomic History   Marital status: Divorced    Spouse name: Not on file   Number of children: 2   Years of education: 11   Highest education level: Not on file  Occupational History   Not on file  Tobacco Use  Smoking status: Former    Packs/day: 0.50    Years: 8.00    Total pack years: 4.00    Types: Cigarettes    Quit date: 2011    Years since quitting: 13.1   Smokeless tobacco: Never   Tobacco comments:    Pt stated that he smoked for about 8 years , he quit 2011.  Vaping Use   Vaping Use: Never used  Substance and Sexual Activity   Alcohol use: Never   Drug use: Never   Sexual activity: Not Currently  Other Topics Concern   Not on file  Social History Narrative   Pt lives with his friends.    Social Determinants of Health   Financial Resource Strain: Low Risk  (09/01/2021)   Overall Financial Resource Strain (CARDIA)    Difficulty of Paying Living Expenses: Not hard at all  Food Insecurity: No Food Insecurity (09/01/2021)   Hunger Vital Sign    Worried About Running Out of Food in the Last Year: Never true    Ran Out of Food in the Last Year: Never true   Transportation Needs: No Transportation Needs (09/01/2021)   PRAPARE - Hydrologist (Medical): No    Lack of Transportation (Non-Medical): No  Physical Activity: Sufficiently Active (09/01/2021)   Exercise Vital Sign    Days of Exercise per Week: 4 days    Minutes of Exercise per Session: 50 min  Stress: No Stress Concern Present (09/01/2021)   [place]    Feeling of Stress : Not at all  Social Connections: Socially Isolated (09/01/2021)   Social Connection and Isolation Panel [NHANES]    Frequency of Communication with Friends and Family: More than three times a week    Frequency of Social Gatherings with Friends and Family: Never    Attends Religious Services: Never    Marine scientist or Organizations: No    Attends Music therapist: Never    Marital Status: Divorced     Family History: The patient's family history includes Cancer in his mother; Colon cancer in his half-brother; Diabetes in his father and mother; Kidney failure in his father; Lung cancer in his half-brother; Stroke in his mother.  ROS:   Please see the history of present illness.     All other systems reviewed and are negative.  EKGs/Labs/Other Studies Reviewed:    The following studies were reviewed today:  EKG:   05/02/22: SR LVH, LAFB  Cardiac Studies & Procedures       ECHOCARDIOGRAM  ECHOCARDIOGRAM COMPLETE 05/18/2021  Narrative ECHOCARDIOGRAM REPORT    Patient Name:   Brandon French Date of Exam: 05/18/2021 Medical Rec #:  [id number]      Height:       72.0 in Accession #:    [id number]     Weight:       319.9 lb Date of Birth:  12/20/1965     BSA:          2.601 m Patient Age:    39 years       BP:           169/33 mmHg Patient Gender: M              HR:           82 bpm. Exam Location:  [facility]  Procedure: 2D Echo, Cardiac Doppler and Color Doppler  Indications:  Stroke  History:        Patient has no prior history of Echocardiogram examinations. Stroke; Risk Factors:Diabetes and Dyslipidemia.  Sonographer:    Wenda Low Referring Phys: [id number] ASIA B [place]   1. Left ventricular ejection fraction, by estimation, is 65 to 70%. The left ventricle has normal function. The left ventricle has no regional wall motion abnormalities. There is severe asymmetric left ventricular hypertrophy of the septal segment (21 mm). Left ventricular diastolic parameters are indeterminate. No obvious LVOT gradient. Although history of hypertension present, consider elective cardiology referral for evaluation of hypertrophic cardiomyopathy. Non-urgent cardiac MRI can be considered. 2. Right ventricular systolic function is normal. The right ventricular size is normal. Tricuspid regurgitation signal is inadequate for assessing PA pressure. 3. The mitral valve is grossly normal. Trivial mitral valve regurgitation. 4. The aortic valve is tricuspid. Aortic valve regurgitation is not visualized. No aortic stenosis is present. Aortic valve mean gradient measures 5.0 mmHg. 5. Aortic dilatation noted. There is borderline dilatation of the aortic root, measuring 40 mm. 6. Unable to estimate CVP.  Comparison(s): No prior Echocardiogram.  FINDINGS Left Ventricle: Left ventricular ejection fraction, by estimation, is 65 to 70%. The left ventricle has normal function. The left ventricle has no regional wall motion abnormalities. The left ventricular internal cavity size was normal in size. There is severe asymmetric left ventricular hypertrophy of the septal segment. Left ventricular diastolic parameters are indeterminate.  Right Ventricle: The right ventricular size is normal. No increase in right ventricular wall thickness. Right ventricular systolic function is normal. Tricuspid regurgitation signal is inadequate for assessing PA pressure.  Left Atrium:  Left atrial size was normal in size.  Right Atrium: Right atrial size was normal in size.  Pericardium: There is no evidence of pericardial effusion.  Mitral Valve: The mitral valve is grossly normal. Trivial mitral valve regurgitation. MV peak gradient, 8.1 mmHg. The mean mitral valve gradient is 4.0 mmHg.  Tricuspid Valve: The tricuspid valve is grossly normal. Tricuspid valve regurgitation is trivial.  Aortic Valve: The aortic valve is tricuspid. There is mild aortic valve annular calcification. Aortic valve regurgitation is not visualized. No aortic stenosis is present. Aortic valve mean gradient measures 5.0 mmHg. Aortic valve peak gradient measures 9.6 mmHg. Aortic valve area, by VTI measures 2.86 cm.  Pulmonic Valve: The pulmonic valve was grossly normal. Pulmonic valve regurgitation is trivial.  Aorta: Aortic dilatation noted. There is borderline dilatation of the aortic root, measuring 40 mm.  Venous: Unable to estimate CVP. The inferior vena cava was not well visualized.  IAS/Shunts: No atrial level shunt detected by color flow Doppler.   LEFT VENTRICLE PLAX 2D LVIDd:         4.50 cm   Diastology LVIDs:         3.10 cm   LV e' medial:    6.20 cm/s LV PW:         1.40 cm   LV E/e' medial:  12.0 LV IVS:        2.10 cm   LV e' lateral:   10.30 cm/s LVOT diam:     2.00 cm   LV E/e' lateral: 7.2 LV SV:         78 LV SV Index:   30 LVOT Area:     3.14 cm   RIGHT VENTRICLE RV Basal diam:  3.80 cm RV Mid diam:    3.20 cm RV S prime:     12.50 cm/s TAPSE (M-mode): 2.4  cm  LEFT ATRIUM             Index        RIGHT ATRIUM           Index LA diam:        3.00 cm 1.15 cm/m   RA Area:     21.40 cm LA Vol (A2C):   82.0 ml 31.52 ml/m  RA Volume:   60.60 ml  23.30 ml/m LA Vol (A4C):   64.2 ml 24.68 ml/m LA Biplane Vol: 77.0 ml 29.60 ml/m AORTIC VALVE                    PULMONIC VALVE AV Area (Vmax):    2.55 cm     PV Vmax:       0.86 m/s AV Area (Vmean):   2.96 cm      PV Peak grad:  3.0 mmHg AV Area (VTI):     2.86 cm AV Vmax:           155.00 cm/s AV Vmean:          97.900 cm/s AV VTI:            0.271 m AV Peak Grad:      9.6 mmHg AV Mean Grad:      5.0 mmHg LVOT Vmax:         126.00 cm/s LVOT Vmean:        92.100 cm/s LVOT VTI:          0.247 m LVOT/AV VTI ratio: 0.91  AORTA Ao Root diam: 4.00 cm  MITRAL VALVE MV Area (PHT): 2.80 cm    SHUNTS MV Area VTI:   3.17 cm    Systemic VTI:  0.25 m MV Peak grad:  8.1 mmHg    Systemic Diam: 2.00 cm MV Mean grad:  4.0 mmHg MV Vmax:       1.42 m/s MV Vmean:      87.8 cm/s MV Decel Time: 271 msec MV E velocity: 74.10 cm/s MV A velocity: 98.50 cm/s MV E/A ratio:  0.75  Rozann Lesches MD Electronically signed by Rozann Lesches MD Signature Date/Time: 05/18/2021/12:05:14 PM    Final               Recent Labs: 03/16/2022: ALT 15; BUN 23; Creatinine, Ser 2.09; Hemoglobin 11.7; Platelets 375; Potassium 4.3; Sodium 141  Recent Lipid Panel    Component Value Date/Time   CHOL 147 03/16/2022 1034   TRIG 54 03/16/2022 1034   HDL 48 03/16/2022 1034   CHOLHDL 3.1 03/16/2022 1034   CHOLHDL 4.8 05/17/2021 0408   VLDL 12 05/17/2021 0408   LDLCALC 88 03/16/2022 1034          Physical Exam:    VS:  BP 128/70   Pulse (!) 53   Ht 6' (1.829 m)   Wt (!) 322 lb (146.1 kg)   SpO2 97%   BMI 43.67 kg/m     Wt Readings from Last 3 Encounters:  09/14/22 (!) 322 lb (146.1 kg)  09/01/22 (!) 327 lb (148.3 kg)  05/16/22 (!) 317 lb 9.6 oz (144.1 kg)    Gen: No distress, Morbid Obesity, smells of urine Neck: No JVD Cardiac: No Rubs or Gallops, systolic murmur with standing and Valsalva, rarely irregular heart beat, +2 radial pulses Respiratory: Clear to auscultation bilaterally, normal effort, normal  respiratory rate GI: Soft, nontender, non-distended  MS: No  edema;  moves all extremities Integument: Skin  feels warm Neuro:  At time of evaluation, alert and oriented to  person/place/time Psych: Normal affect, patient feels fine   ASSESSMENT:    1. Stage 3b chronic kidney disease ([place])   2. Hypertension, unspecified type   3. Cerebrovascular accident (CVA), unspecified mechanism ([place])   4. Hypertrophic cardiomyopathy (HCC)     PLAN:    Hypertrophic Cardiomyopathy - septal Variant - waiting on confirmatory testing (CMR) - discussed heart monitoring - discussed son and daughter needing screening - I think genetic testing is not financially feasible presently   HTN  LE edema - controlled on current therapy - will cut norvasc to 5 mg; will start lasix 20 mg PO daily   Prior stroke HLD - LDL goal 55 - we are pending repatha but he would need a grant for this  Morbid Obesity - has lost 83 lbs and counting  I have concerns he is not taking some of his medication for financial concerns. I have offered him social work evaluation.  He notes that he can afford everything but the repatha and respectfully declines.  4 months me or APP    Medication Adjustments/Labs and Tests Ordered: Current medicines are reviewed at length with the patient today.  Concerns regarding medicines are outlined above.  Orders Placed This Encounter  Procedures   Pro b natriuretic peptide (BNP)   Basic metabolic panel   Meds ordered this encounter  Medications   amLODipine (NORVASC) 5 MG tablet    Sig: Take 1 tablet (5 mg total) by mouth daily.    Dispense:  90 tablet    Refill:  3   furosemide (LASIX) 20 MG tablet    Sig: Take 1 tablet (20 mg total) by mouth daily.    Dispense:  90 tablet    Refill:  3    Patient Instructions  Medication Instructions:  Your physician has recommended you make the following change in your medication:  DECREASE: amlodipine (Norvasc) to 5 mg by mouth once daily  START: furosemide (Lasix) 20 mg by mouth once daily  *If you need a refill on your cardiac medications before your next appointment, please call your  pharmacy*   Lab Work: IN 2 WEEKS: BMP, BNP  If you have labs (blood work) drawn today and your tests are completely normal, you will receive your results only by: [place] (if you have MyChart) OR A paper copy in the mail If you have any lab test that is abnormal or we need to change your treatment, we will call you to review the results.   Testing/Procedures: Please monitor your BP    Follow-Up: At [facility], you and your health needs are our priority.  As part of our continuing mission to provide you with exceptional heart care, we have created designated Provider Care Teams.  These Care Teams include your primary Cardiologist (physician) and Advanced Practice Providers (APPs -  Physician Assistants and Nurse Practitioners) who all work together to provide you with the care you need, when you need it.  Your next appointment:   4 month(s)  Provider:   Rudean Haskell, MD or Nicholes Rough, PA-C, Ambrose Pancoast, NP, or Christen Bame, NP          Signed, Werner Lean, MD  09/14/2022 2:45 PM    [place]

## 2022-09-14 NOTE — Patient Instructions (Signed)
Medication Instructions:  Your physician has recommended you make the following change in your medication:  DECREASE: amlodipine (Norvasc) to 5 mg by mouth once daily  START: furosemide (Lasix) 20 mg by mouth once daily  *If you need a refill on your cardiac medications before your next appointment, please call your pharmacy*   Lab Work: IN 2 WEEKS: BMP, BNP  If you have labs (blood work) drawn today and your tests are completely normal, you will receive your results only by: [place] (if you have MyChart) OR A paper copy in the mail If you have any lab test that is abnormal or we need to change your treatment, we will call you to review the results.   Testing/Procedures: Please monitor your BP    Follow-Up: At [facility], you and your health needs are our priority.  As part of our continuing mission to provide you with exceptional heart care, we have created designated Provider Care Teams.  These Care Teams include your primary Cardiologist (physician) and Advanced Practice Providers (APPs -  Physician Assistants and Nurse Practitioners) who all work together to provide you with the care you need, when you need it.  Your next appointment:   4 month(s)  Provider:   Rudean Haskell, MD or Nicholes Rough, PA-C, Ambrose Pancoast, NP, or Christen Bame, NP

## 2022-09-15 ENCOUNTER — Encounter: Payer: Self-pay | Admitting: Pharmacist

## 2022-09-15 NOTE — Telephone Encounter (Signed)
Medicare verification approved. Brandon French now active. Brandon French info called into pharmacy. Tried to call patient, but phone says line no longer in service.Will try sending mychart message.  CARD NO. [id number]   CARD STATUS Active   BIN [id number]   PCN [id number]   PC GROUP [id number]

## 2022-09-22 NOTE — Telephone Encounter (Signed)
Tried to call pt again but phone rings several times and then says no longer in service. On DRP mother is listed. Called her # [phone] and was able to speak with patient. I gave him the grant information and instructed him to give this information to his pharmacy. Cost should be $0.

## 2022-10-19 ENCOUNTER — Encounter: Payer: Self-pay | Admitting: Internal Medicine

## 2022-10-19 ENCOUNTER — Ambulatory Visit (INDEPENDENT_AMBULATORY_CARE_PROVIDER_SITE_OTHER): Payer: No Typology Code available for payment source | Admitting: Internal Medicine

## 2022-10-19 VITALS — BP 181/110 | HR 97 | Ht 72.0 in | Wt 321.2 lb

## 2022-10-19 DIAGNOSIS — I10 Essential (primary) hypertension: Secondary | ICD-10-CM

## 2022-10-19 MED ORDER — HYDRALAZINE HCL 100 MG PO TABS: 100.0000 mg | ORAL_TABLET | Freq: Two times a day (BID) | ORAL | 1 refills | Status: DC

## 2022-10-19 MED ORDER — OLMESARTAN MEDOXOMIL 20 MG PO TABS: 20.0000 mg | ORAL_TABLET | Freq: Every day | ORAL | 2 refills | Status: DC

## 2022-10-19 MED ORDER — METOPROLOL SUCCINATE ER 50 MG PO TB24: 50.0000 mg | ORAL_TABLET | Freq: Every day | ORAL | 3 refills | Status: DC

## 2022-10-19 NOTE — Assessment & Plan Note (Signed)
BP remains significantly elevated today, 192/109 initially and 187 on 110 on repeat.  He is asymptomatic.  His current antihypertensive regimen includes amlodipine 5 mg daily, clonidine 0.3 mg twice daily, Lasix 20 mg daily, hydralazine 100 mg twice daily, and metoprolol succinate 25 mg daily.  He has been seen by cardiology for follow-up since his last appointment with me and his blood pressure was 128/70.  Otherwise his blood pressure has been significantly elevated at all other appointments. -Add olmesartan 20 mg daily -Increase metoprolol succinate to 50 mg daily -Follow-up in 2 weeks for BP check.

## 2022-10-19 NOTE — Progress Notes (Signed)
Established Patient Office Visit  Subjective   Patient ID: Brandon French, male    DOB: [birth year]  Age: 57 y.o. MRN: [id number]  Chief Complaint  Patient presents with   Hypertension    Follow up   Brandon French returns to care today for follow-up.  He was last seen by me on 2/2 at which time his blood pressure was significantly elevated.  No medication changes were made and 6-week follow-up was arranged.  In the interim he has been seen by cardiology for follow-up.  There have otherwise been no acute interval events.  Brandon French reports feeling well today.  He is asymptomatic and has no acute concerns discussed.  Past Medical History:  Diagnosis Date   Chronic kidney disease    Diabetes mellitus (HCC)    Elevated cholesterol    High blood pressure    Past Surgical History:  Procedure Laterality Date   TRANSURETHRAL RESECTION OF PROSTATE  2020   Social History   Tobacco Use   Smoking status: Former    Packs/day: 0.50    Years: 8.00    Additional pack years: 0.00    Total pack years: 4.00    Types: Cigarettes    Quit date: 2011    Years since quitting: 13.2   Smokeless tobacco: Never   Tobacco comments:    Pt stated that he smoked for about 8 years , he quit 2011.  Vaping Use   Vaping Use: Never used  Substance Use Topics   Alcohol use: Never   Drug use: Never   Family History  Problem Relation Age of Onset   Stroke Mother    Diabetes Mother    Cancer Mother    Diabetes Father    Kidney failure Father    Colon cancer Half-Brother    Lung cancer Half-Brother    Allergies  Allergen Reactions   Aleve [Naproxen]    Review of Systems  Constitutional:  Negative for chills and fever.  HENT:  Negative for sore throat.   Respiratory:  Negative for cough and shortness of breath.   Cardiovascular:  Negative for chest pain, palpitations and leg swelling.  Gastrointestinal:  Negative for abdominal pain, blood in stool, constipation, diarrhea, nausea and vomiting.   Genitourinary:  Negative for dysuria and hematuria.  Musculoskeletal:  Negative for myalgias.  Skin:  Negative for itching and rash.  Neurological:  Negative for dizziness and headaches.  Psychiatric/Behavioral:  Negative for depression and suicidal ideas.      Objective:     BP (!) 181/110   Pulse 97   Ht 6' (1.829 m)   Wt (!) 321 lb 3.2 oz (145.7 kg)   SpO2 96%   BMI 43.56 kg/m  BP Readings from Last 3 Encounters:  10/19/22 (!) 181/110  09/14/22 128/70  09/01/22 (!) 185/98   Physical Exam Vitals reviewed.  Constitutional:      General: He is not in acute distress.    Appearance: Normal appearance. He is obese. He is not ill-appearing.  HENT:     Head: Normocephalic and atraumatic.     Nose: Nose normal. No congestion or rhinorrhea.     Mouth/Throat:     Mouth: Mucous membranes are moist.     Pharynx: Oropharynx is clear.  Eyes:     Extraocular Movements: Extraocular movements intact.     Conjunctiva/sclera: Conjunctivae normal.     Pupils: Pupils are equal, round, and reactive to light.  Cardiovascular:     Rate and  Rhythm: Normal rate and regular rhythm.     Pulses: Normal pulses.     Heart sounds: Murmur heard.  Pulmonary:     Effort: Pulmonary effort is normal.     Breath sounds: Normal breath sounds. No wheezing, rhonchi or rales.  Abdominal:     General: Abdomen is flat. Bowel sounds are normal. There is no distension.     Palpations: Abdomen is soft.     Tenderness: There is no abdominal tenderness.  Musculoskeletal:        General: No deformity. Normal range of motion.     Cervical back: Normal range of motion.     Right lower leg: Edema present.     Left lower leg: Edema present.  Skin:    General: Skin is warm and dry.     Capillary Refill: Capillary refill takes less than 2 seconds.  Neurological:     General: No focal deficit present.     Mental Status: He is alert and oriented to person, place, and time.     Motor: No weakness.  Psychiatric:         Mood and Affect: Mood normal.        Behavior: Behavior normal.        Thought Content: Thought content normal.   Last CBC Lab Results  Component Value Date   WBC 6.9 03/16/2022   HGB 11.7 (L) 03/16/2022   HCT 37.6 03/16/2022   MCV 79 03/16/2022   MCH 24.4 (L) 03/16/2022   RDW 15.4 03/16/2022   PLT 375 [id number]   Last metabolic panel Lab Results  Component Value Date   GLUCOSE 114 (H) 03/16/2022   NA 141 03/16/2022   K 4.3 03/16/2022   CL 104 03/16/2022   CO2 25 03/16/2022   BUN 23 03/16/2022   CREATININE 2.09 (H) 03/16/2022   EGFR 37 (L) 03/16/2022   CALCIUM 8.5 (L) 03/16/2022   PHOS 2.9 05/19/2021   PROT 6.6 03/16/2022   ALBUMIN 3.6 (L) 03/16/2022   LABGLOB 3.0 03/16/2022   AGRATIO 1.2 03/16/2022   BILITOT 0.2 03/16/2022   ALKPHOS 54 03/16/2022   AST 17 03/16/2022   ALT 15 03/16/2022   ANIONGAP 5 06/06/2021   Last lipids Lab Results  Component Value Date   CHOL 147 03/16/2022   HDL 48 03/16/2022   LDLCALC 88 03/16/2022   TRIG 54 03/16/2022   CHOLHDL 3.1 03/16/2022   Last hemoglobin A1c Lab Results  Component Value Date   HGBA1C 5.7 (H) 03/16/2022   Last thyroid functions Lab Results  Component Value Date   TSH 1.208 05/17/2021   Last vitamin B12 and Folate Lab Results  Component Value Date   VITAMINB12 289 05/18/2021    Assessment & Plan:   Problem List Items Addressed This Visit       Hypertension - Primary    BP remains significantly elevated today, 192/109 initially and 187 on 110 on repeat.  He is asymptomatic.  His current antihypertensive regimen includes amlodipine 5 mg daily, clonidine 0.3 mg twice daily, Lasix 20 mg daily, hydralazine 100 mg twice daily, and metoprolol succinate 25 mg daily.  He has been seen by cardiology for follow-up since his last appointment with me and his blood pressure was 128/70.  Otherwise his blood pressure has been significantly elevated at all other appointments. -Add olmesartan 20 mg  daily -Increase metoprolol succinate to 50 mg daily -Follow-up in 2 weeks for BP check.      Return in about  2 weeks (around 11/02/2022) for HTN.    Brandon Abraham, MD

## 2022-10-19 NOTE — Patient Instructions (Signed)
It was a pleasure to see you today.  Thank you for giving [place] the opportunity to be involved in your care.  Below is a brief recap of your visit and next steps.  We will plan to see you again in 2 weeks.  Summary Add olmesartan 20 mg Increase metoprolol to 50 mg daily Follow up in 2 weeks

## 2022-10-25 ENCOUNTER — Other Ambulatory Visit: Payer: Self-pay | Admitting: Pharmacist

## 2022-10-25 DIAGNOSIS — E782 Mixed hyperlipidemia: Secondary | ICD-10-CM

## 2022-10-25 MED ORDER — ATORVASTATIN CALCIUM 80 MG PO TABS: 80.0000 mg | ORAL_TABLET | Freq: Every day | ORAL | 3 refills | Status: DC

## 2022-10-25 NOTE — Patient Instructions (Addendum)
Hi Ula Lingo asked cardiology to send a refill on your atorvastatin and Dr. Doren Custard to send a refill on the clopidogrel.   We will mail you the patient assistance application for the Wilder Glade - this is a medication that can help lower your blood sugars AND protect your kidneys. Once you are on this, Dr. Doren Custard may be able to stop your glimepiride.   Check your blood pressure twice weekly, and any time you have concerning symptoms like headache, chest pain, dizziness, shortness of breath, or vision changes.   Our goal is less than 130/80.  To appropriately check your blood pressure, make sure you do the following:  1) Avoid caffeine, exercise, or tobacco products for 30 minutes before checking. Empty your bladder. 2) Sit with your back supported in a flat-backed chair. Rest your arm on something flat (arm of the chair, table, etc). 3) Sit still with your feet flat on the floor, resting, for at least 5 minutes.  4) Check your blood pressure. Take 1-2 readings.  5) Write down these readings and bring with you to any provider appointments.  Bring your home blood pressure machine with you to a provider's office for accuracy comparison at least once a year.   Make sure you take your blood pressure medications before you come to any office visit, even if you were asked to fast for labs.   Take care!  Catie Hedwig Morton, PharmD, [place], [facility] [phone]

## 2022-10-25 NOTE — Progress Notes (Signed)
10/25/2022 Name: Brandon French MRN: [id number] DOB: 02/05/1966  Chief Complaint  Patient presents with   Medication Management    Brandon French is a 57 y.o. year old male who presented for a telephone visit.   They were referred to the pharmacist by a quality report for assistance in managing failing Controlling Blood Pressure and Cholesterol Medication Adherence in 2023.    Subjective:  Care Team: Primary Care Provider: Johnette Abraham, MD ; Next Scheduled Visit: 4/4/2  Medication Access/Adherence  Current Pharmacy:  [id number] - [place], [place] - [address] [place] [address] Phone: [phone] Fax: [phone]   Patient reports affordability concerns with their medications: Yes  Patient reports access/transportation concerns to their pharmacy: No  Patient reports adherence concerns with their medications:  No    However, noted today that he has not filled atorvastatin since August, and does not have clopidogrel or [place].    Diabetes:  Current medications: glimepiride 1 mg daily; Farxiga 10 mg daily - has not picked up  Current glucose readings: not checking  Patient reports periodic hypoglycemic s/sx including dizziness, shakiness, sweating.   Current medication access support: contacted the pharmacy; Wilder Glade copay is $45/month  Hypertension:  Current medications: olmesartan 20 mg daily, clonidine 0.3 mg twice daily, metoprolol succinate 50 mg daily, hydralazine 100 mg twice daily, amlodipine 5 mg daily   Notes he just picked up olmesartan yesterday, is starting today  Patient has an upper arm home BP cuff that belongs to his mother that he uses occasionally to check blood pressures  Patient denies hypotensive s/sx including dizziness, lightheadedness.  Patient denies hypertensive symptoms including headache, chest pain, shortness of breath   Hyperlipidemia/ASCVD Risk Reduction  Current lipid  lowering medications: Repatha 140 mg every 14 days - started last week; ezetimibe 10 mg daily, atorvastatin 80 mg daily - has not filled since August  Antiplatelet regimen: clopidogrel 75 mg daily - requests refills   Objective:  Lab Results  Component Value Date   HGBA1C 5.7 (H) 03/16/2022    Lab Results  Component Value Date   CREATININE 2.09 (H) 03/16/2022   BUN 23 03/16/2022   NA 141 03/16/2022   K 4.3 03/16/2022   CL 104 03/16/2022   CO2 25 03/16/2022    Lab Results  Component Value Date   CHOL 147 03/16/2022   HDL 48 03/16/2022   LDLCALC 88 03/16/2022   TRIG 54 03/16/2022   CHOLHDL 3.1 03/16/2022    Medications Reviewed Today     Reviewed by Johnette Abraham, MD (Physician) on 10/19/22 at [place] List Status: <None>   Medication Order Taking? Sig Documenting Provider Last Dose Status Informant  amLODipine (NORVASC) 5 MG tablet [id number] Yes Take 1 tablet (5 mg total) by mouth daily. Werner Lean, MD Taking Active   atorvastatin (LIPITOR) 80 MG tablet [id number] Yes TAKE 1 TABLET BY MOUTH EVERY DAY Paseda, Dewaine Conger, FNP Taking Active   cloNIDine (CATAPRES) 0.3 MG tablet [id number] Yes Take 1 tablet (0.3 mg total) by mouth 2 (two) times daily. Werner Lean, MD Taking Active   clopidogrel (PLAVIX) 75 MG tablet [id number] Yes Take 1 tablet (75 mg total) by mouth daily. Johnette Abraham, MD Taking Active   [facility] IRON 325 (65 Fe) MG tablet [id number] Yes TAKE 1 TABLET BY MOUTH EVERY DAY Johnette Abraham, MD Taking Active   dapagliflozin propanediol (FARXIGA) 10 MG TABS tablet [id number] Yes  Take 1 tablet (10 mg total) by mouth daily before breakfast. Johnette Abraham, MD Taking Active   Evolocumab [facility] SURECLICK) [id number] MG/ML Darden Palmer [id number] Yes Inject 140 mg into the skin every 14 (fourteen) days. Werner Lean, MD Taking Active   ezetimibe (ZETIA) 10 MG tablet [id number] Yes Take 1 tablet (10 mg total) by mouth daily. Renee Rival, FNP  Taking Active   furosemide (LASIX) 20 MG tablet [id number] Yes Take 1 tablet (20 mg total) by mouth daily. Werner Lean, MD Taking Active   glimepiride (AMARYL) 1 MG tablet [id number] Yes TAKE 1 TABLET BY MOUTH EVERY DAY WITH BREAKFAST Johnette Abraham, MD Taking Active   hydrALAZINE (APRESOLINE) 100 MG tablet [id number] Yes TAKE 1 TABLET BY MOUTH TWICE A DAY Johnette Abraham, MD Taking Active   metoprolol succinate (TOPROL XL) 25 MG 24 hr tablet [id number] Yes Take 1 tablet (25 mg total) by mouth daily. Werner Lean, MD Taking Active   UNABLE TO FIND [id number] Yes Blood glucose monitor kit use to check blood glucose twice daily DX: E11.9 Renee Rival, FNP Taking Active   UNABLE TO FIND [id number] Yes Lancets use to check blood glucose twice daily DX: E11.9 Renee Rival, FNP Taking Active   UNABLE TO FIND [id number] Yes Blood glucose test strips use to check blood glucose twice daily DX: E11.9 Renee Rival, FNP Taking Active   UNABLE TO FIND [id number] Yes Large blood pressure cuff DX: I10 Paseda, Dewaine Conger, FNP Taking Active               Assessment/Plan:   Diabetes: - Currently controlled but with opportunity for optimization - Meets financial criteria for [place] patient assistance program through [employer]. Will collaborate with provider, CPhT, and patient to pursue assistance.    Hypertension: - Currently uncontrolled - Reviewed long term cardiovascular and renal outcomes of uncontrolled blood pressure - Reviewed appropriate blood pressure monitoring technique and reviewed goal blood pressure. Recommended to check home blood pressure and heart rate periodically - Recommend to continue current regimen; follow up with PCP next week and cardiology as scheduled   Hyperlipidemia/ASCVD Risk Reduction: - Currently uncontrolled.  - Collaborated with cardiology for refill on atorvastatin 80 mg daily. Continue Repatha and ezetimibe for now; will  defer to cardiology for any opportunities to streamline moving forward  - Cardiology approved for [place] for [place]   Follow Up Plan: phone call from Lottie Dawson in 6 weeks  Catie TJodi Mourning, PharmD, [place], [facility] [phone]

## 2022-10-26 ENCOUNTER — Telehealth: Payer: Self-pay

## 2022-10-26 ENCOUNTER — Other Ambulatory Visit (HOSPITAL_COMMUNITY): Payer: Self-pay

## 2022-10-26 NOTE — Telephone Encounter (Signed)
Pt has been approved for [employer] patient assistance until 07/31/2023, waiting on RX to be faxed to [facility]

## 2022-10-26 NOTE — Telephone Encounter (Signed)
-----   Message from Osker Mason, [place] sent at 10/25/2022  2:49 PM EDT ----- Hi - please mail patient portion for [employer] for [place]. Lottie Dawson will be following moving forward, unsure if this should go to [place]. Thanks!

## 2022-10-27 ENCOUNTER — Other Ambulatory Visit: Payer: No Typology Code available for payment source | Admitting: Pharmacist

## 2022-10-27 ENCOUNTER — Other Ambulatory Visit: Payer: Self-pay | Admitting: Pharmacist

## 2022-10-27 ENCOUNTER — Other Ambulatory Visit: Payer: Self-pay

## 2022-10-27 DIAGNOSIS — E1122 Type 2 diabetes mellitus with diabetic chronic kidney disease: Secondary | ICD-10-CM

## 2022-10-27 DIAGNOSIS — N1832 Chronic kidney disease, stage 3b: Secondary | ICD-10-CM

## 2022-10-27 MED ORDER — DAPAGLIFLOZIN PROPANEDIOL 10 MG PO TABS: 10.0000 mg | ORAL_TABLET | Freq: Every day | ORAL | 1 refills | Status: DC

## 2022-10-27 NOTE — Progress Notes (Signed)
Care Coordination Call  Notified by CPhT that patient was approved for Farxiga assistance through [facility] and Me, script needed to [facility] for dispensing.   Discussed with PCP, order placed. Script sent. Will collaborate with CPhT team to contact company to place order and contact patient once received to review refill process.   Spoke with patient about approval. He had just woken up, but confirmed he is feeling well after starting olmesartan the other day. Has not picked up atorvastatin from the pharmacy yet. He notes he checked his blood pressure last night and "it was 150".   Follow up with pharmacist has been moved to 2 weeks to help support patient adherence and understanding of regimen.   Catie Hedwig Morton, PharmD, [place], [facility] [phone]

## 2022-10-30 NOTE — Telephone Encounter (Signed)
Called [employer] to confirm that they received rx, they have and confirmed shipment. Pt can expect medication to be delivered to home in 10-14 business days

## 2022-11-01 ENCOUNTER — Telehealth (HOSPITAL_COMMUNITY): Payer: Self-pay | Admitting: Emergency Medicine

## 2022-11-01 NOTE — Telephone Encounter (Signed)
Attempted to call patient regarding upcoming cardiac MR appointment. Left message on voicemail with name and callback number Shawnna Pancake RN Navigator Cardiac Imaging [facility] [phone] Office [phone] Cell  

## 2022-11-02 ENCOUNTER — Ambulatory Visit (HOSPITAL_COMMUNITY): Admission: RE | Admit: 2022-11-02 | Payer: No Typology Code available for payment source | Source: Ambulatory Visit

## 2022-11-02 ENCOUNTER — Encounter: Payer: Self-pay | Admitting: Internal Medicine

## 2022-11-02 ENCOUNTER — Ambulatory Visit (INDEPENDENT_AMBULATORY_CARE_PROVIDER_SITE_OTHER): Payer: No Typology Code available for payment source | Admitting: Internal Medicine

## 2022-11-02 DIAGNOSIS — I10 Essential (primary) hypertension: Secondary | ICD-10-CM | POA: Diagnosis not present

## 2022-11-02 MED ORDER — CHLORTHALIDONE 25 MG PO TABS: 25.0000 mg | ORAL_TABLET | Freq: Every day | ORAL | 2 refills | Status: DC

## 2022-11-02 MED ORDER — HYDRALAZINE HCL 100 MG PO TABS: 100.0000 mg | ORAL_TABLET | Freq: Three times a day (TID) | ORAL | 1 refills | Status: DC

## 2022-11-02 NOTE — Progress Notes (Signed)
Established Patient Office Visit  Subjective   Patient ID: Brandon French, male    DOB: February 16, 1966  Age: 57 y.o. MRN: [id number]  Chief Complaint  Patient presents with   Hypertension    Follow up   Mr. Tenorio returns to care today for HTN follow-up.  He was last seen by me on 3/21 at which time his blood pressure remained significantly elevated.  Olmesartan 20 mg daily was added to his regimen and metoprolol succinate was increased to 50 mg daily.  2-week follow-up was arranged.  There have been no acute interval events.  Mr. Duecker reports feeling fairly well today.  His blood pressure remains significantly elevated.  He states that he has experienced increased edema in his lower extremities since starting olmesartan.  He would like to discontinue this medication today.  He has no additional concerns to discuss.  Past Medical History:  Diagnosis Date   Chronic kidney disease    Diabetes mellitus    Elevated cholesterol    High blood pressure    Past Surgical History:  Procedure Laterality Date   TRANSURETHRAL RESECTION OF PROSTATE  2020   Social History   Tobacco Use   Smoking status: Former    Packs/day: 0.50    Years: 8.00    Additional pack years: 0.00    Total pack years: 4.00    Types: Cigarettes    Quit date: 2011    Years since quitting: 13.2   Smokeless tobacco: Never   Tobacco comments:    Pt stated that he smoked for about 8 years , he quit 2011.  Vaping Use   Vaping Use: Never used  Substance Use Topics   Alcohol use: Never   Drug use: Never   Family History  Problem Relation Age of Onset   Stroke Mother    Diabetes Mother    Cancer Mother    Diabetes Father    Kidney failure Father    Colon cancer Half-Brother    Lung cancer Half-Brother    Allergies  Allergen Reactions   Aleve [Naproxen]    Review of Systems  Cardiovascular:  Positive for leg swelling.  All other systems reviewed and are negative.    Objective:     BP (!) 162/80    Pulse 73   Ht 6' (1.829 m)   Wt (!) 343 lb 9.6 oz (155.9 kg)   SpO2 94%   BMI 46.60 kg/m  BP Readings from Last 3 Encounters:  11/02/22 (!) 162/80  10/19/22 (!) 181/110  09/14/22 128/70   Physical Exam Vitals reviewed.  Constitutional:      General: He is not in acute distress.    Appearance: Normal appearance. He is obese. He is not ill-appearing.  HENT:     Head: Normocephalic and atraumatic.     Nose: Nose normal. No congestion or rhinorrhea.     Mouth/Throat:     Mouth: Mucous membranes are moist.     Pharynx: Oropharynx is clear.  Eyes:     Extraocular Movements: Extraocular movements intact.     Conjunctiva/sclera: Conjunctivae normal.     Pupils: Pupils are equal, round, and reactive to light.  Cardiovascular:     Rate and Rhythm: Normal rate and regular rhythm.     Pulses: Normal pulses.     Heart sounds: Murmur heard.  Pulmonary:     Effort: Pulmonary effort is normal.     Breath sounds: Normal breath sounds. No wheezing, rhonchi or rales.  Abdominal:  General: Abdomen is flat. Bowel sounds are normal. There is no distension.     Palpations: Abdomen is soft.     Tenderness: There is no abdominal tenderness.  Musculoskeletal:        General: No deformity. Normal range of motion.     Cervical back: Normal range of motion.     Right lower leg: Edema present.     Left lower leg: Edema present.  Skin:    General: Skin is warm and dry.     Capillary Refill: Capillary refill takes less than 2 seconds.  Neurological:     General: No focal deficit present.     Mental Status: He is alert and oriented to person, place, and time.     Motor: No weakness.  Psychiatric:        Mood and Affect: Mood normal.        Behavior: Behavior normal.        Thought Content: Thought content normal.   Last CBC Lab Results  Component Value Date   WBC 6.9 03/16/2022   HGB 11.7 (L) 03/16/2022   HCT 37.6 03/16/2022   MCV 79 03/16/2022   MCH 24.4 (L) 03/16/2022   RDW 15.4  03/16/2022   PLT 375 03/16/2022   Last metabolic panel Lab Results  Component Value Date   GLUCOSE 114 (H) 03/16/2022   NA 141 03/16/2022   K 4.3 03/16/2022   CL 104 03/16/2022   CO2 25 03/16/2022   BUN 23 03/16/2022   CREATININE 2.09 (H) 03/16/2022   EGFR 37 (L) 03/16/2022   CALCIUM 8.5 (L) 03/16/2022   PHOS 2.9 05/19/2021   PROT 6.6 03/16/2022   ALBUMIN 3.6 (L) 03/16/2022   LABGLOB 3.0 03/16/2022   AGRATIO 1.2 03/16/2022   BILITOT 0.2 03/16/2022   ALKPHOS 54 03/16/2022   AST 17 03/16/2022   ALT 15 03/16/2022   ANIONGAP 5 06/06/2021   Last lipids Lab Results  Component Value Date   CHOL 147 03/16/2022   HDL 48 03/16/2022   LDLCALC 88 03/16/2022   TRIG 54 03/16/2022   CHOLHDL 3.1 03/16/2022   Last hemoglobin A1c Lab Results  Component Value Date   HGBA1C 5.7 (H) 03/16/2022   Last thyroid functions Lab Results  Component Value Date   TSH 1.208 05/17/2021   Last vitamin B12 and Folate Lab Results  Component Value Date   VITAMINB12 289 05/18/2021     Assessment & Plan:   Problem List Items Addressed This Visit       Hypertension    Returning to care today for HTN follow-up.  He was last evaluated by me on 3/21 at which time his blood pressure remains significantly elevated.  Olmesartan 20 mg daily was added to his antihypertensive regimen metoprolol succinate was increased to 50 mg daily.  Today he reports that he has developed worsening bilateral lower extremity edema since starting olmesartan.  2+ bilateral lower extreme edema is present on exam today.  Denies symptoms of orthopnea/PND and dyspnea on exertion.  BP has improved, but remains elevated at 162/82. -Discontinue olmesartan -Add chlorthalidone 25 mg daily -Increase hydralazine to 100 mg 3 times daily -Follow-up in 4 weeks for HTN check -Consider switching amlodipine to Procardia if the above changes are not effective -We discussed establishing care with the advanced hypertension clinic in  [place], however he has declined this referral for now transportation issues       Return in about 4 weeks (around 11/30/2022) for HTN.  Johnette Abraham, MD

## 2022-11-02 NOTE — Patient Instructions (Signed)
It was a pleasure to see you today.  Thank you for giving [place] the opportunity to be involved in your care.  Below is a brief recap of your visit and next steps.  We will plan to see you again in 4 weeks.  Summary Stop olmesartan Increase hydralazine to 100 mg three times daily Add chlorthalidone 25 mg daily Follow up in 4 weeks

## 2022-11-08 ENCOUNTER — Telehealth: Payer: Self-pay | Admitting: Internal Medicine

## 2022-11-08 ENCOUNTER — Other Ambulatory Visit: Payer: Self-pay

## 2022-11-08 ENCOUNTER — Encounter: Payer: Self-pay | Admitting: Internal Medicine

## 2022-11-08 DIAGNOSIS — I10 Essential (primary) hypertension: Secondary | ICD-10-CM

## 2022-11-08 DIAGNOSIS — N1832 Chronic kidney disease, stage 3b: Secondary | ICD-10-CM

## 2022-11-08 DIAGNOSIS — E1122 Type 2 diabetes mellitus with diabetic chronic kidney disease: Secondary | ICD-10-CM

## 2022-11-08 DIAGNOSIS — I639 Cerebral infarction, unspecified: Secondary | ICD-10-CM

## 2022-11-08 DIAGNOSIS — E118 Type 2 diabetes mellitus with unspecified complications: Secondary | ICD-10-CM

## 2022-11-08 DIAGNOSIS — D508 Other iron deficiency anemias: Secondary | ICD-10-CM

## 2022-11-08 MED ORDER — CLOPIDOGREL BISULFATE 75 MG PO TABS: 75.0000 mg | ORAL_TABLET | Freq: Every day | ORAL | 0 refills | Status: DC

## 2022-11-08 MED ORDER — CHLORTHALIDONE 25 MG PO TABS: 25.0000 mg | ORAL_TABLET | Freq: Every day | ORAL | 0 refills | Status: DC

## 2022-11-08 MED ORDER — METOPROLOL SUCCINATE ER 50 MG PO TB24
50.0000 mg | ORAL_TABLET | Freq: Every day | ORAL | 0 refills | Status: DC
Start: 2022-11-08 — End: 2023-03-09

## 2022-11-08 MED ORDER — FERROUS SULFATE 325 (65 FE) MG PO TABS: 325.0000 mg | ORAL_TABLET | Freq: Every day | ORAL | 0 refills | Status: DC

## 2022-11-08 MED ORDER — DAPAGLIFLOZIN PROPANEDIOL 10 MG PO TABS: 10.0000 mg | ORAL_TABLET | Freq: Every day | ORAL | 0 refills | Status: DC

## 2022-11-08 MED ORDER — GLIMEPIRIDE 1 MG PO TABS
ORAL_TABLET | ORAL | 0 refills | Status: DC
Start: 2022-11-08 — End: 2023-01-17

## 2022-11-08 MED ORDER — HYDRALAZINE HCL 100 MG PO TABS
100.0000 mg | ORAL_TABLET | Freq: Three times a day (TID) | ORAL | 0 refills | Status: DC
Start: 2022-11-08 — End: 2022-12-05

## 2022-11-08 NOTE — Telephone Encounter (Signed)
New message     1. Which medications need to be refilled? (please list name of each medication and dose if known)  chlorthalidone (HYGROTON) 25 MG tablet  clopidogrel (PLAVIX) 75 MG tablet  [facility] IRON 325 (65 Fe) MG tablet  dapagliflozin propanediol (FARXIGA) 10 MG TABS tablet  glimepiride (AMARYL) 1 MG tablet  hydrALAZINE (APRESOLINE) 100 MG tablet   2. Which pharmacy/location (including street and city if local pharmacy) is medication to be sent to? [facility]    3. Do they need a 30 day or 90 day supply? 90 day supply

## 2022-11-08 NOTE — Telephone Encounter (Signed)
Refills sent

## 2022-11-08 NOTE — Assessment & Plan Note (Addendum)
Returning to care today for HTN follow-up.  He was last evaluated by me on 3/21 at which time his blood pressure remains significantly elevated.  Olmesartan 20 mg daily was added to his antihypertensive regimen metoprolol succinate was increased to 50 mg daily.  Today he reports that he has developed worsening bilateral lower extremity edema since starting olmesartan.  2+ bilateral lower extreme edema is present on exam today.  Denies symptoms of orthopnea/PND and dyspnea on exertion.  BP has improved, but remains elevated at 162/82. -Discontinue olmesartan -Add chlorthalidone 25 mg daily -Increase hydralazine to 100 mg 3 times daily -Follow-up in 4 weeks for HTN check -Consider switching amlodipine to Procardia if the above changes are not effective -We discussed establishing care with the advanced hypertension clinic in [place], however he has declined this referral for now transportation issues

## 2022-11-09 ENCOUNTER — Ambulatory Visit: Payer: No Typology Code available for payment source

## 2022-11-13 ENCOUNTER — Encounter: Payer: No Typology Code available for payment source | Admitting: Internal Medicine

## 2022-11-13 DIAGNOSIS — D508 Other iron deficiency anemias: Secondary | ICD-10-CM | POA: Diagnosis not present

## 2022-11-13 DIAGNOSIS — E1122 Type 2 diabetes mellitus with diabetic chronic kidney disease: Secondary | ICD-10-CM | POA: Diagnosis not present

## 2022-11-13 DIAGNOSIS — D638 Anemia in other chronic diseases classified elsewhere: Secondary | ICD-10-CM | POA: Diagnosis not present

## 2022-11-13 DIAGNOSIS — I129 Hypertensive chronic kidney disease with stage 1 through stage 4 chronic kidney disease, or unspecified chronic kidney disease: Secondary | ICD-10-CM | POA: Diagnosis not present

## 2022-11-13 DIAGNOSIS — R809 Proteinuria, unspecified: Secondary | ICD-10-CM | POA: Diagnosis not present

## 2022-11-13 DIAGNOSIS — Z6841 Body Mass Index (BMI) 40.0 and over, adult: Secondary | ICD-10-CM | POA: Diagnosis not present

## 2022-11-13 DIAGNOSIS — N189 Chronic kidney disease, unspecified: Secondary | ICD-10-CM | POA: Diagnosis not present

## 2022-11-13 DIAGNOSIS — R600 Localized edema: Secondary | ICD-10-CM | POA: Diagnosis not present

## 2022-11-13 DIAGNOSIS — E1129 Type 2 diabetes mellitus with other diabetic kidney complication: Secondary | ICD-10-CM | POA: Diagnosis not present

## 2022-11-16 ENCOUNTER — Other Ambulatory Visit: Payer: Self-pay | Admitting: Internal Medicine

## 2022-11-23 DIAGNOSIS — I129 Hypertensive chronic kidney disease with stage 1 through stage 4 chronic kidney disease, or unspecified chronic kidney disease: Secondary | ICD-10-CM | POA: Diagnosis not present

## 2022-11-23 DIAGNOSIS — E1129 Type 2 diabetes mellitus with other diabetic kidney complication: Secondary | ICD-10-CM | POA: Diagnosis not present

## 2022-11-23 DIAGNOSIS — R600 Localized edema: Secondary | ICD-10-CM | POA: Diagnosis not present

## 2022-11-23 DIAGNOSIS — D638 Anemia in other chronic diseases classified elsewhere: Secondary | ICD-10-CM | POA: Diagnosis not present

## 2022-11-23 DIAGNOSIS — Z6841 Body Mass Index (BMI) 40.0 and over, adult: Secondary | ICD-10-CM | POA: Diagnosis not present

## 2022-11-23 DIAGNOSIS — R809 Proteinuria, unspecified: Secondary | ICD-10-CM | POA: Diagnosis not present

## 2022-11-23 DIAGNOSIS — N189 Chronic kidney disease, unspecified: Secondary | ICD-10-CM | POA: Diagnosis not present

## 2022-11-23 DIAGNOSIS — D508 Other iron deficiency anemias: Secondary | ICD-10-CM | POA: Diagnosis not present

## 2022-11-23 DIAGNOSIS — E1122 Type 2 diabetes mellitus with diabetic chronic kidney disease: Secondary | ICD-10-CM | POA: Diagnosis not present

## 2022-12-05 ENCOUNTER — Ambulatory Visit (INDEPENDENT_AMBULATORY_CARE_PROVIDER_SITE_OTHER): Payer: No Typology Code available for payment source | Admitting: Internal Medicine

## 2022-12-05 ENCOUNTER — Encounter: Payer: Self-pay | Admitting: Internal Medicine

## 2022-12-05 VITALS — BP 160/74 | HR 79 | Ht 72.0 in | Wt 317.8 lb

## 2022-12-05 DIAGNOSIS — I10 Essential (primary) hypertension: Secondary | ICD-10-CM | POA: Diagnosis not present

## 2022-12-05 DIAGNOSIS — E782 Mixed hyperlipidemia: Secondary | ICD-10-CM | POA: Diagnosis not present

## 2022-12-05 MED ORDER — HYDRALAZINE HCL 100 MG PO TABS: 100.0000 mg | ORAL_TABLET | Freq: Three times a day (TID) | ORAL | 0 refills | Status: DC

## 2022-12-05 MED ORDER — NIFEDIPINE ER OSMOTIC RELEASE 60 MG PO TB24
60.0000 mg | ORAL_TABLET | Freq: Every day | ORAL | 2 refills | Status: DC
Start: 2022-12-05 — End: 2023-03-09

## 2022-12-05 NOTE — Assessment & Plan Note (Signed)
Currently prescribed Repatha.  Follow-up with clinical pharmacist scheduled for tomorrow (5/8). -Direct LDL level ordered today

## 2022-12-05 NOTE — Progress Notes (Signed)
Established Patient Office Visit  Subjective   Patient ID: Brandon French, male    DOB: 1966/01/13  Age: 57 y.o. MRN: [id number]  Chief Complaint  Patient presents with   Hypertension    Follow up   Brandon French returns to care today for HTN follow-up.  He was last evaluated by me on 4/4 at which time chlorthalidone 25 mg daily was added to his antihypertensive regimen.  Olmesartan was discontinued.  Hydralazine was also increased to 100 mg 3 times daily.  4-week follow-up was arranged for HTN check.  There have been no acute interval events.  Brandon French reports feeling well today.  He is asymptomatic and has no additional concerns to discuss.  He has not experienced any adverse side effects with recent medication adjustments.  Past Medical History:  Diagnosis Date   Chronic kidney disease    Diabetes mellitus (HCC)    Elevated cholesterol    High blood pressure    Past Surgical History:  Procedure Laterality Date   TRANSURETHRAL RESECTION OF PROSTATE  2020   Social History   Tobacco Use   Smoking status: Former    Packs/day: 0.50    Years: 8.00    Additional pack years: 0.00    Total pack years: 4.00    Types: Cigarettes    Quit date: 2011    Years since quitting: 13.3   Smokeless tobacco: Never   Tobacco comments:    Pt stated that he smoked for about 8 years , he quit 2011.  Vaping Use   Vaping Use: Never used  Substance Use Topics   Alcohol use: Never   Drug use: Never   Family History  Problem Relation Age of Onset   Stroke Mother    Diabetes Mother    Cancer Mother    Diabetes Father    Kidney failure Father    Colon cancer Half-Brother    Lung cancer Half-Brother    Allergies  Allergen Reactions   Aleve [Naproxen]    Review of Systems  Constitutional:  Negative for chills and fever.  HENT:  Negative for sore throat.   Respiratory:  Negative for cough and shortness of breath.   Cardiovascular:  Negative for chest pain, palpitations and leg  swelling.  Gastrointestinal:  Negative for abdominal pain, blood in stool, constipation, diarrhea, nausea and vomiting.  Genitourinary:  Negative for dysuria and hematuria.  Musculoskeletal:  Negative for myalgias.  Skin:  Negative for itching and rash.  Neurological:  Negative for dizziness and headaches.  Psychiatric/Behavioral:  Negative for depression and suicidal ideas.       Objective:     BP (!) 160/74   Pulse 79   Ht 6' (1.829 m)   Wt (!) 317 lb 12.8 oz (144.2 kg)   SpO2 94%   BMI 43.10 kg/m  BP Readings from Last 3 Encounters:  12/05/22 (!) 160/74  11/02/22 (!) 162/80  10/19/22 (!) 181/110   Physical Exam Vitals reviewed.  Constitutional:      General: He is not in acute distress.    Appearance: Normal appearance. He is obese. He is not ill-appearing.  HENT:     Head: Normocephalic and atraumatic.     Nose: Nose normal. No congestion or rhinorrhea.     Mouth/Throat:     Mouth: Mucous membranes are moist.     Pharynx: Oropharynx is clear.  Eyes:     Extraocular Movements: Extraocular movements intact.     Conjunctiva/sclera: Conjunctivae normal.  Pupils: Pupils are equal, round, and reactive to light.  Cardiovascular:     Rate and Rhythm: Normal rate and regular rhythm.     Pulses: Normal pulses.     Heart sounds: Murmur heard.  Pulmonary:     Effort: Pulmonary effort is normal.     Breath sounds: Normal breath sounds. No wheezing, rhonchi or rales.  Abdominal:     General: Abdomen is flat. Bowel sounds are normal. There is no distension.     Palpations: Abdomen is soft.     Tenderness: There is no abdominal tenderness.  Musculoskeletal:        General: Swelling (L>R) present. No deformity. Normal range of motion.     Cervical back: Normal range of motion.     Right lower leg: Edema present.     Left lower leg: Edema present.  Skin:    General: Skin is warm and dry.     Capillary Refill: Capillary refill takes less than 2 seconds.  Neurological:      General: No focal deficit present.     Mental Status: He is alert and oriented to person, place, and time.     Motor: No weakness.  Psychiatric:        Mood and Affect: Mood normal.        Behavior: Behavior normal.        Thought Content: Thought content normal.   Last CBC Lab Results  Component Value Date   WBC 6.9 03/16/2022   HGB 11.7 (L) 03/16/2022   HCT 37.6 03/16/2022   MCV 79 03/16/2022   MCH 24.4 (L) 03/16/2022   RDW 15.4 03/16/2022   PLT 375 03/16/2022   Last metabolic panel Lab Results  Component Value Date   GLUCOSE 114 (H) 03/16/2022   NA 141 03/16/2022   K 4.3 03/16/2022   CL 104 03/16/2022   CO2 25 03/16/2022   BUN 23 03/16/2022   CREATININE 2.09 (H) 03/16/2022   EGFR 37 (L) 03/16/2022   CALCIUM 8.5 (L) 03/16/2022   PHOS 2.9 05/19/2021   PROT 6.6 03/16/2022   ALBUMIN 3.6 (L) 03/16/2022   LABGLOB 3.0 03/16/2022   AGRATIO 1.2 03/16/2022   BILITOT 0.2 03/16/2022   ALKPHOS 54 03/16/2022   AST 17 03/16/2022   ALT 15 03/16/2022   ANIONGAP 5 06/06/2021   Last lipids Lab Results  Component Value Date   CHOL 147 03/16/2022   HDL 48 03/16/2022   LDLCALC 88 03/16/2022   TRIG 54 03/16/2022   CHOLHDL 3.1 03/16/2022   Last hemoglobin A1c Lab Results  Component Value Date   HGBA1C 5.7 (H) 03/16/2022   Last thyroid functions Lab Results  Component Value Date   TSH 1.208 05/17/2021   Last vitamin B12 and Folate Lab Results  Component Value Date   VITAMINB12 289 05/18/2021     Assessment & Plan:   Problem List Items Addressed This Visit       Hypertension    Presenting today for HTN follow-up.  Last evaluated by me on 4/4.  Chlorthalidone was started at that time and hydralazine increased to 100 mg 3 times daily.  Olmesartan was discontinued as he reported that he had experienced worsening bilateral lower extremity edema since starting ARB therapy.  Edema has essentially resolved today.  If BP remains significantly elevated. -Discontinue  amlodipine -Start Procardia XL 60 mg daily -Continue chlorthalidone, hydralazine, furosemide, and clonidine at current doses -Follow-up in 4 weeks for HTN check -We again discussed establishing care  with the advanced hypertension clinic in [place], however he continues to decline this referral -He is now established with nephrology and will be seen for follow-up next week -Consider retrial of ACEi/ARB therapy      HLD (hyperlipidemia) - Primary    Currently prescribed Repatha.  Follow-up with clinical pharmacist scheduled for tomorrow (5/8). -Direct LDL level ordered today      Return in about 4 weeks (around 01/02/2023) for HTN.   Billie Lade, MD

## 2022-12-05 NOTE — Assessment & Plan Note (Signed)
Presenting today for HTN follow-up.  Last evaluated by me on 4/4.  Chlorthalidone was started at that time and hydralazine increased to 100 mg 3 times daily.  Olmesartan was discontinued as he reported that he had experienced worsening bilateral lower extremity edema since starting ARB therapy.  Edema has essentially resolved today.  If BP remains significantly elevated. -Discontinue amlodipine -Start Procardia XL 60 mg daily -Continue chlorthalidone, hydralazine, furosemide, and clonidine at current doses -Follow-up in 4 weeks for HTN check -We again discussed establishing care with the advanced hypertension clinic in [place], however he continues to decline this referral -He is now established with nephrology and will be seen for follow-up next week -Consider retrial of ACEi/ARB therapy

## 2022-12-05 NOTE — Patient Instructions (Signed)
It was a pleasure to see you today.  Thank you for giving [place] the opportunity to be involved in your care.  Below is a brief recap of your visit and next steps.  We will plan to see you again in 4 weeks.  Summary Stop amlodipine and start nifedipine 60 mg daily (Procardia) Repeat labs ordered today Follow up in 4 weeks

## 2022-12-06 ENCOUNTER — Other Ambulatory Visit: Payer: No Typology Code available for payment source | Admitting: Pharmacist

## 2022-12-06 LAB — BASIC METABOLIC PANEL
BUN/Creatinine Ratio: 13 (ref 9–20)
BUN: 28 mg/dL — ABNORMAL HIGH (ref 6–24)
CO2: 22 mmol/L (ref 20–29)
Calcium: 8.6 mg/dL — ABNORMAL LOW (ref 8.7–10.2)
Chloride: 107 mmol/L — ABNORMAL HIGH (ref 96–106)
Creatinine, Ser: 2.09 mg/dL — ABNORMAL HIGH (ref 0.76–1.27)
Glucose: 114 mg/dL — ABNORMAL HIGH (ref 70–99)
Potassium: 4.2 mmol/L (ref 3.5–5.2)
Sodium: 142 mmol/L (ref 134–144)
eGFR: 37 mL/min/{1.73_m2} — ABNORMAL LOW (ref >59)

## 2022-12-06 LAB — LDL CHOLESTEROL, DIRECT: LDL Direct: 58 mg/dL (ref 0–99)

## 2022-12-08 ENCOUNTER — Telehealth: Payer: Self-pay | Admitting: Internal Medicine

## 2022-12-08 NOTE — Telephone Encounter (Signed)
Contacted Brandon French to schedule their annual wellness visit. Appointment made for 12/20/2022.  Thank you,  Brandon French,  AMB Clinical Support [facility] AWV Program Direct Dial ??[phone]

## 2022-12-13 ENCOUNTER — Other Ambulatory Visit: Payer: No Typology Code available for payment source | Admitting: Pharmacist

## 2022-12-20 ENCOUNTER — Other Ambulatory Visit: Payer: No Typology Code available for payment source | Admitting: Pharmacist

## 2022-12-28 ENCOUNTER — Ambulatory Visit: Payer: No Typology Code available for payment source

## 2022-12-28 DIAGNOSIS — Z0000 Encounter for general adult medical examination without abnormal findings: Secondary | ICD-10-CM

## 2022-12-28 NOTE — Progress Notes (Signed)
Subjective:   Brandon French is a 57 y.o. male who presents for Medicare Annual/Subsequent preventive examination.  Review of Systems    I connected with  Brandon French on 12/28/22 by a audio enabled telemedicine application and verified that I am speaking with the correct person using two identifiers.  Patient Location: Home  Provider Location: Office/Clinic  I discussed the limitations of evaluation and management by telemedicine. The patient expressed understanding and agreed to proceed.        Objective:    There were no vitals filed for this visit. There is no height or weight on file to calculate BMI.     09/13/2021    2:48 PM 09/01/2021    8:13 AM 05/20/2021    3:15 PM 05/17/2021    1:00 PM  Advanced Directives  Does Patient Have a Medical Advance Directive? No No No No  Would patient like information on creating a medical advance directive?  Yes (ED - Information included in AVS) No - Patient declined No - Patient declined    Current Medications (verified) Outpatient Encounter Medications as of 12/28/2022  Medication Sig   atorvastatin (LIPITOR) 80 MG tablet Take 1 tablet (80 mg total) by mouth daily.   chlorthalidone (HYGROTON) 25 MG tablet Take 1 tablet (25 mg total) by mouth daily.   cloNIDine (CATAPRES) 0.3 MG tablet Take 1 tablet (0.3 mg total) by mouth 2 (two) times daily.   clopidogrel (PLAVIX) 75 MG tablet Take 1 tablet (75 mg total) by mouth daily.   dapagliflozin propanediol (FARXIGA) 10 MG TABS tablet Take 1 tablet (10 mg total) by mouth daily before breakfast.   Evolocumab (REPATHA SURECLICK) 140 MG/ML SOAJ Inject 140 mg into the skin every 14 (fourteen) days.   ezetimibe (ZETIA) 10 MG tablet Take 1 tablet (10 mg total) by mouth daily.   ferrous sulfate ([facility] IRON) 325 (65 FE) MG tablet Take 1 tablet (325 mg total) by mouth daily.   furosemide (LASIX) 20 MG tablet Take 1 tablet (20 mg total) by mouth daily.   glimepiride (AMARYL) 1 MG tablet TAKE 1 TABLET  BY MOUTH EVERY DAY WITH BREAKFAST   hydrALAZINE (APRESOLINE) 100 MG tablet Take 1 tablet (100 mg total) by mouth 3 (three) times daily.   metoprolol succinate (TOPROL-XL) 50 MG 24 hr tablet Take 1 tablet (50 mg total) by mouth daily. Take with or immediately following a meal.   NIFEdipine (PROCARDIA XL) 60 MG 24 hr tablet Take 1 tablet (60 mg total) by mouth daily.   No facility-administered encounter medications on file as of 12/28/2022.    Allergies (verified) Aleve [naproxen]   History: Past Medical History:  Diagnosis Date   Chronic kidney disease    Diabetes mellitus (HCC)    Elevated cholesterol    High blood pressure    Past Surgical History:  Procedure Laterality Date   TRANSURETHRAL RESECTION OF PROSTATE  2020   Family History  Problem Relation Age of Onset   Stroke Mother    Diabetes Mother    Cancer Mother    Diabetes Father    Kidney failure Father    Colon cancer Half-Brother    Lung cancer Half-Brother    Social History   Socioeconomic History   Marital status: Divorced    Spouse name: Not on file   Number of children: 2   Years of education: 11   Highest education level: Not on file  Occupational History   Not on file  Tobacco  Use   Smoking status: Former    Packs/day: 0.50    Years: 8.00    Additional pack years: 0.00    Total pack years: 4.00    Types: Cigarettes    Quit date: 2011    Years since quitting: 13.4   Smokeless tobacco: Never   Tobacco comments:    Pt stated that he smoked for about 8 years , he quit 2011.  Vaping Use   Vaping Use: Never used  Substance and Sexual Activity   Alcohol use: Never   Drug use: Never   Sexual activity: Not Currently  Other Topics Concern   Not on file  Social History Narrative   Pt lives with his friends.    Social Determinants of Health   Financial Resource Strain: Low Risk  (09/01/2021)   Overall Financial Resource Strain (CARDIA)    Difficulty of Paying Living Expenses: Not hard at all   Food Insecurity: No Food Insecurity (09/01/2021)   Hunger Vital Sign    Worried About Running Out of Food in the Last Year: Never true    Ran Out of Food in the Last Year: Never true  Transportation Needs: No Transportation Needs (09/01/2021)   PRAPARE - Administrator, Civil Service (Medical): No    Lack of Transportation (Non-Medical): No  Physical Activity: Sufficiently Active (09/01/2021)   Exercise Vital Sign    Days of Exercise per Week: 4 days    Minutes of Exercise per Session: 50 min  Stress: No Stress Concern Present (09/01/2021)   [employer] of Occupational Health - Occupational Stress Questionnaire    Feeling of Stress : Not at all  Social Connections: Socially Isolated (09/01/2021)   Social Connection and Isolation Panel [NHANES]    Frequency of Communication with Friends and Family: More than three times a week    Frequency of Social Gatherings with Friends and Family: Never    Attends Religious Services: Never    Database administrator or Organizations: No    Attends Banker Meetings: Never    Marital Status: Divorced    Tobacco Counseling Counseling given: Not Answered Tobacco comments: Pt stated that he smoked for about 8 years , he quit 2011.   Clinical Intake:                 Diabetic?yes Nutrition Risk Assessment:  Has the patient had any N/V/D within the last 2 months?  No  Does the patient have any non-healing wounds?  No  Has the patient had any unintentional weight loss or weight gain?  No   Diabetes:  Is the patient diabetic?  Yes  If diabetic, was a CBG obtained today?  No  Did the patient bring in their glucometer from home?  No  How often do you monitor your CBG's? never.   Financial Strains and Diabetes Management:  Are you having any financial strains with the device, your supplies or your medication? No .  Does the patient want to be seen by Chronic Care Management for management of their diabetes?  No   Would the patient like to be referred to a Nutritionist or for Diabetic Management?  No   Diabetic Exams:  Diabetic Eye Exam: Completed 10/25/21 Diabetic Foot Exam: Completed 03/28/22           Activities of Daily Living     No data to display           Patient Care Team: Christel Mormon  E, MD as PCP - General (Internal Medicine)  Indicate any recent Medical Services you may have received from other than [facility] providers in the past year (date may be approximate).     Assessment:   This is a routine wellness examination for Brandon French.  Hearing/Vision screen No results found.  Dietary issues and exercise activities discussed:     Goals Addressed   None   Depression Screen    12/05/2022    2:44 PM 11/02/2022    2:47 PM 10/19/2022    1:10 PM 09/01/2022   11:27 AM 05/16/2022   11:02 AM 04/18/2022    2:29 PM 03/09/2022    2:02 PM  PHQ 2/9 Scores  PHQ - 2 Score 0 0 0 0 0 0 0  PHQ- 9 Score 0 0 0 0       Fall Risk    12/05/2022    2:44 PM 11/02/2022    2:47 PM 10/19/2022    1:10 PM 09/01/2022   11:27 AM 05/16/2022   11:02 AM  Fall Risk   Falls in the past year? 0 0 0 0 0  Number falls in past yr: 0 0 0 0 0  Injury with Fall? 0 0 0 0 0  Risk for fall due to : No Fall Risks No Fall Risks No Fall Risks No Fall Risks No Fall Risks  Follow up Falls evaluation completed Falls evaluation completed Falls evaluation completed Falls evaluation completed Falls evaluation completed    FALL RISK PREVENTION PERTAINING TO THE HOME:  Any stairs in or around the home? No  If so, are there any without handrails? No  Home free of loose throw rugs in walkways, pet beds, electrical cords, etc? Yes  Adequate lighting in your home to reduce risk of falls? Yes   ASSISTIVE DEVICES UTILIZED TO PREVENT FALLS:  Life alert? No  Use of a cane, walker or w/c? No  Grab bars in the bathroom? Yes  Shower chair or bench in shower? Yes  Elevated toilet seat or a handicapped toilet? No          09/01/2021    8:16 AM  6CIT Screen  What Year? 0 points  What month? 0 points  What time? 0 points  Count back from 20 0 points  Months in reverse 0 points  Repeat phrase 0 points  Total Score 0 points    Immunizations Immunization History  Administered Date(s) Administered   Influenza,inj,Quad PF,6+ Mos 05/19/2021, 04/18/2022    TDAP status: Due, Education has been provided regarding the importance of this vaccine. Advised may receive this vaccine at local pharmacy or Health Dept. Aware to provide a copy of the vaccination record if obtained from local pharmacy or Health Dept. Verbalized acceptance and understanding.  Flu Vaccine status: Up to date   Covid-19 vaccine status: Information provided on how to obtain vaccines.   Qualifies for Shingles Vaccine? Yes   Zostavax completed No   Shingrix Completed?: No.    Education has been provided regarding the importance of this vaccine. Patient has been advised to call insurance company to determine out of pocket expense if they have not yet received this vaccine. Advised may also receive vaccine at local pharmacy or Health Dept. Verbalized acceptance and understanding.  Screening Tests Health Maintenance  Topic Date Due   Hepatitis C Screening  Never done   DTaP/Tdap/Td (1 - Tdap) Never done   Zoster Vaccines- Shingrix (1 of 2) Never done   HEMOGLOBIN A1C  09/16/2022   OPHTHALMOLOGY EXAM  10/26/2022   INFLUENZA VACCINE  03/01/2023   Diabetic kidney evaluation - Urine ACR  03/17/2023   FOOT EXAM  03/29/2023   Diabetic kidney evaluation - eGFR measurement  12/05/2023   Medicare Annual Wellness (AWV)  12/28/2023   Colonoscopy  10/07/2031   HIV Screening  Completed   HPV VACCINES  Aged Out   COVID-19 Vaccine  Discontinued    Health Maintenance  Health Maintenance Due  Topic Date Due   Hepatitis C Screening  Never done   DTaP/Tdap/Td (1 - Tdap) Never done   Zoster Vaccines- Shingrix (1 of 2) Never done   HEMOGLOBIN A1C   09/16/2022   OPHTHALMOLOGY EXAM  10/26/2022    Colorectal cancer screening: Type of screening: Colonoscopy. Completed 10/06/21. Repeat every 10 years  Lung Cancer Screening: (Low Dose CT Chest recommended if Age 78-80 years, 30 pack-year currently smoking OR have quit w/in 15years.) does not qualify.   Lung Cancer Screening Referral: no  Additional Screening:  Hepatitis C Screening: does qualify; Completed no  Vision Screening: Recommended annual ophthalmology exams for early detection of glaucoma and other disorders of the eye. Is the patient up to date with their annual eye exam?  yes Who is the provider or what is the name of the office in which the patient attends annual eye exams? N/a If pt is not established with a provider, would they like to be referred to a provider to establish care? No .   Dental Screening: Recommended annual dental exams for proper oral hygiene  Community Resource Referral / Chronic Care Management: CRR required this visit?  No   CCM required this visit?  No      Plan:     I have personally reviewed and noted the following in the patient's chart:   Medical and social history Use of alcohol, tobacco or illicit drugs  Current medications and supplements including opioid prescriptions. Patient is not currently taking opioid prescriptions. Functional ability and status Nutritional status Physical activity Advanced directives List of other physicians Hospitalizations, surgeries, and ER visits in previous 12 months Vitals Screenings to include cognitive, depression, and falls Referrals and appointments  In addition, I have reviewed and discussed with patient certain preventive protocols, quality metrics, and best practice recommendations. A written personalized care plan for preventive services as well as general preventive health recommendations were provided to patient.     Jasper Riling, CMA   12/28/2022

## 2022-12-28 NOTE — Patient Instructions (Signed)
  Brandon French , Thank you for taking time to come for your Medicare Wellness Visit. I appreciate your ongoing commitment to your health goals. Please review the following plan we discussed and let me know if I can assist you in the future.   These are the goals we discussed:  Goals      Patient Stated     Get his own apartment        This is a list of the screening recommended for you and due dates:  Health Maintenance  Topic Date Due   Hepatitis C Screening  Never done   DTaP/Tdap/Td vaccine (1 - Tdap) Never done   Zoster (Shingles) Vaccine (1 of 2) Never done   Hemoglobin A1C  09/16/2022   Eye exam for diabetics  10/26/2022   Flu Shot  03/01/2023   Yearly kidney health urinalysis for diabetes  03/17/2023   Complete foot exam   03/29/2023   Yearly kidney function blood test for diabetes  12/05/2023   Medicare Annual Wellness Visit  12/28/2023   Colon Cancer Screening  10/07/2031   HIV Screening  Completed   HPV Vaccine  Aged Out   COVID-19 Vaccine  Discontinued

## 2023-01-02 ENCOUNTER — Encounter: Payer: Self-pay | Admitting: Internal Medicine

## 2023-01-02 ENCOUNTER — Ambulatory Visit (INDEPENDENT_AMBULATORY_CARE_PROVIDER_SITE_OTHER): Payer: No Typology Code available for payment source | Admitting: Internal Medicine

## 2023-01-02 VITALS — BP 123/69 | HR 80 | Ht 72.0 in | Wt 323.2 lb

## 2023-01-02 DIAGNOSIS — I10 Essential (primary) hypertension: Secondary | ICD-10-CM

## 2023-01-02 NOTE — Assessment & Plan Note (Signed)
Returning to care today for HTN follow-up.  BP today has improved to 123/69.  Procardia XL 60 mg daily was started at his last appointment.  He is additionally prescribed chlorthalidone 25 mg daily, clonidine 0.3 mg twice daily, hydralazine 100 mg 3 times daily, Toprol-XL 50 mg daily, and Lasix 20 mg daily. -No additional medication changes today -Follow-up in 3 months for reassessment

## 2023-01-02 NOTE — Progress Notes (Signed)
   12/06/2022 Name: Brandon French MRN: [id number] DOB: September 17, 1965  Hyperlipidemia   Brandon French is a 57 y.o. year old male who presented for a telephone visit.    Patient was in need of access to Repatha.  [facility] already sees [facility] for this medication/assistance and has an active grant via [employer].  I called [facility] [place] and his fill was too early and not due until 12/15/22.  Patient was confused and thought Repatha was obtained via mail order pharmacy.  I explained to him that the [facility] grant must be used locally.  Patient instructed to call in on 5/17 and arrange transportation.  He reports compliance with Repatha injection    Kieth Brightly, PharmD, BCACP Clinical Pharmacist, [facility]

## 2023-01-02 NOTE — Progress Notes (Signed)
   12/13/22  Name: Brandon French MRN: [id number] DOB: July 21, 1966  Chief Complaint  Patient presents with   Medication Management    [facility]  Patient notified to [facility]   Kieth Brightly, PharmD, BCACP Clinical Pharmacist, [facility]

## 2023-01-02 NOTE — Patient Instructions (Signed)
It was a pleasure to see you today.  Thank you for giving [place] the opportunity to be involved in your care.  Below is a brief recap of your visit and next steps.  We will plan to see you again in 3 months.  Summary No medication changes today Follow up in 3 months

## 2023-01-02 NOTE — Progress Notes (Signed)
Established Patient Office Visit  Subjective   Patient ID: Brandon French, male    DOB: 07/18/1966  Age: 57 y.o. MRN: [id number]  Chief Complaint  Patient presents with   Hypertension    Follow up   [name] returns to care today for HTN follow-up.  Last evaluated by me on 5/7 at which time amlodipine was discontinued in favor of Procardia XL 60 mg daily.  Chlorthalidone, hydralazine, furosemide, and clonidine were continued other current doses.  4-week follow-up was arranged for HTN check.  There have been no acute interval events.  [name] reports feeling well today.  He is asymptomatic and has no additional concerns to discuss.  BP today is 123/69.  Past Medical History:  Diagnosis Date   Chronic kidney disease    Diabetes mellitus (HCC)    Elevated cholesterol    High blood pressure    Past Surgical History:  Procedure Laterality Date   TRANSURETHRAL RESECTION OF PROSTATE  2020   Social History   Tobacco Use   Smoking status: Former    Packs/day: 0.50    Years: 8.00    Additional pack years: 0.00    Total pack years: 4.00    Types: Cigarettes    Quit date: 2011    Years since quitting: 13.4   Smokeless tobacco: Never   Tobacco comments:    Pt stated that he smoked for about 8 years , he quit 2011.  Vaping Use   Vaping Use: Never used  Substance Use Topics   Alcohol use: Never   Drug use: Never   Family History  Problem Relation Age of Onset   Stroke Mother    Diabetes Mother    Cancer Mother    Diabetes Father    Kidney failure Father    Colon cancer Half-Brother    Lung cancer Half-Brother    Allergies  Allergen Reactions   Aleve [Naproxen]    Review of Systems  Constitutional:  Negative for chills and fever.  HENT:  Negative for sore throat.   Respiratory:  Negative for cough and shortness of breath.   Cardiovascular:  Negative for chest pain, palpitations and leg swelling.  Gastrointestinal:  Negative for abdominal pain, blood in stool,  constipation, diarrhea, nausea and vomiting.  Genitourinary:  Negative for dysuria and hematuria.  Musculoskeletal:  Negative for myalgias.  Skin:  Negative for itching and rash.  Neurological:  Negative for dizziness and headaches.  Psychiatric/Behavioral:  Negative for depression and suicidal ideas.      Objective:     BP 123/69   Pulse 80   Ht 6' (1.829 m)   Wt (!) 323 lb 3.2 oz (146.6 kg)   SpO2 95%   BMI 43.83 kg/m  BP Readings from Last 3 Encounters:  01/02/23 123/69  12/05/22 (!) 160/74  11/02/22 (!) 162/80   Physical Exam Vitals reviewed.  Constitutional:      General: He is not in acute distress.    Appearance: Normal appearance. He is obese. He is not ill-appearing.  HENT:     Head: Normocephalic and atraumatic.     Right Ear: External ear normal.     Left Ear: External ear normal.     Nose: Nose normal. No congestion or rhinorrhea.     Mouth/Throat:     Mouth: Mucous membranes are moist.     Pharynx: Oropharynx is clear.  Eyes:     General: No scleral icterus.    Extraocular Movements: Extraocular movements  intact.     Conjunctiva/sclera: Conjunctivae normal.     Pupils: Pupils are equal, round, and reactive to light.  Cardiovascular:     Rate and Rhythm: Normal rate and regular rhythm.     Pulses: Normal pulses.     Heart sounds: Murmur heard.  Pulmonary:     Effort: Pulmonary effort is normal.     Breath sounds: Normal breath sounds. No wheezing, rhonchi or rales.  Abdominal:     General: Abdomen is flat. Bowel sounds are normal. There is no distension.     Palpations: Abdomen is soft.     Tenderness: There is no abdominal tenderness.  Musculoskeletal:        General: No swelling or deformity. Normal range of motion.     Cervical back: Normal range of motion.     Right lower leg: Edema present.     Left lower leg: Edema present.  Skin:    General: Skin is warm and dry.     Capillary Refill: Capillary refill takes less than 2 seconds.   Neurological:     General: No focal deficit present.     Mental Status: He is alert and oriented to person, place, and time.     Motor: No weakness.     Gait: Gait abnormal.  Psychiatric:        Mood and Affect: Mood normal.        Behavior: Behavior normal.        Thought Content: Thought content normal.   Last CBC Lab Results  Component Value Date   WBC 6.9 03/16/2022   HGB 11.7 (L) 03/16/2022   HCT 37.6 03/16/2022   MCV 79 03/16/2022   MCH 24.4 (L) 03/16/2022   RDW 15.4 03/16/2022   PLT 375 03/16/2022   Last metabolic panel Lab Results  Component Value Date   GLUCOSE 114 (H) 12/05/2022   NA 142 12/05/2022   K 4.2 12/05/2022   CL 107 (H) 12/05/2022   CO2 22 12/05/2022   BUN 28 (H) 12/05/2022   CREATININE 2.09 (H) 12/05/2022   EGFR 37 (L) 12/05/2022   CALCIUM 8.6 (L) 12/05/2022   PHOS 2.9 05/19/2021   PROT 6.6 03/16/2022   ALBUMIN 3.6 (L) 03/16/2022   LABGLOB 3.0 03/16/2022   AGRATIO 1.2 03/16/2022   BILITOT 0.2 03/16/2022   ALKPHOS 54 03/16/2022   AST 17 03/16/2022   ALT 15 03/16/2022   ANIONGAP 5 06/06/2021   Last lipids Lab Results  Component Value Date   CHOL 147 03/16/2022   HDL 48 03/16/2022   LDLCALC 88 03/16/2022   LDLDIRECT 58 12/05/2022   TRIG 54 03/16/2022   CHOLHDL 3.1 03/16/2022   Last hemoglobin A1c Lab Results  Component Value Date   HGBA1C 5.7 (H) 03/16/2022   Last thyroid functions Lab Results  Component Value Date   TSH 1.208 05/17/2021   Last vitamin B12 and Folate Lab Results  Component Value Date   VITAMINB12 289 05/18/2021     Assessment & Plan:   Problem List Items Addressed This Visit       Hypertension - Primary    Returning to care today for HTN follow-up.  BP today has improved to 123/69.  Procardia XL 60 mg daily was started at his last appointment.  He is additionally prescribed chlorthalidone 25 mg daily, clonidine 0.3 mg twice daily, hydralazine 100 mg 3 times daily, Toprol-XL 50 mg daily, and Lasix 20 mg  daily. -No additional medication changes today -Follow-up in  3 months for reassessment       Return in about 3 months (around 04/04/2023).    Billie Lade, MD

## 2023-01-11 ENCOUNTER — Ambulatory Visit: Payer: No Typology Code available for payment source | Attending: Physician Assistant | Admitting: Physician Assistant

## 2023-01-11 ENCOUNTER — Encounter: Payer: Self-pay | Admitting: Physician Assistant

## 2023-01-11 NOTE — Progress Notes (Deleted)
  Cardiology Office Note:  .   Date:  01/11/2023  ID:  Brandon French, DOB 05-01-1966, MRN [id number] PCP: Billie Lade, MD  [facility] Providers Cardiologist:  Christell Constant, MD { History of Present Illness: .   Brandon French is a 57 y.o. male with a past medical history of hypertension, hyperlipidemia, CKD who presents for follow-up appointment.  Last seen in the office February 2024 by Dr. Izora Ribas for initial workup of HCM found on echocardiogram.  Blood pressure has been high since at least 2016 when he had his first 2 strokes.  Diagnosed with DM.  Did not get CMR.  Paperwork done for PCSK9 inhibitor but ended up making dietary changes.  No heart monitor and no MRI to review (pending 11/02/2022).  Patient was doing well at that appointment.  No chest pressure or pain.  No lower extremity edema.  No palpitations or syncope.  No SOB/DOE.  No family history of SCD.  Mother diagnosed with CAD.  He has 2 children a daughter and a son.  Son lives in [place] daughter lives in [place].  Today, he***  ROS: ***  Studies Reviewed: Marland Kitchen    EKG:  ***  *** Risk Assessment/Calculations:   {Does this patient have ATRIAL FIBRILLATION?[phone]} No BP recorded.  {Refresh Note OR Click here to enter BP  :1}***       Physical Exam:   VS:  There were no vitals taken for this visit.   Wt Readings from Last 3 Encounters:  01/02/23 (!) 323 lb 3.2 oz (146.6 kg)  12/05/22 (!) 317 lb 12.8 oz (144.2 kg)  11/02/22 (!) 343 lb 9.6 oz (155.9 kg)    GEN: Well nourished, well developed in no acute distress NECK: No JVD; No carotid bruits CARDIAC: ***RRR, no murmurs, rubs, gallops RESPIRATORY:  Clear to auscultation without rales, wheezing or rhonchi  ABDOMEN: Soft, non-tender, non-distended EXTREMITIES:  No edema; No deformity   ASSESSMENT AND PLAN: .   1.  Hypertrophic cardiomyopathy -cardiac MRI??  2.  Hypertension 3.  Prior stroke 4.  HLD 5. Morbid obesity    {Are you  ordering a CV Procedure (e.g. stress test, cath, DCCV, TEE, etc)?   Press F2        :[id number]}  Dispo: ***  Signed, Sharlene Dory, PA-C

## 2023-01-17 ENCOUNTER — Other Ambulatory Visit: Payer: No Typology Code available for payment source | Admitting: Pharmacist

## 2023-01-17 ENCOUNTER — Other Ambulatory Visit: Payer: Self-pay | Admitting: Internal Medicine

## 2023-01-17 DIAGNOSIS — E118 Type 2 diabetes mellitus with unspecified complications: Secondary | ICD-10-CM

## 2023-01-17 DIAGNOSIS — I10 Essential (primary) hypertension: Secondary | ICD-10-CM

## 2023-01-18 NOTE — Progress Notes (Signed)
01/17/2023 Name: Brandon French MRN: [id number] DOB: [birth year]  Chief Complaint  Patient presents with   Hyperlipidemia    Brandon French is a 57 y.o. year old male who presented for a telephone visit.   They were referred to the pharmacist by their PCP for assistance in managing hyperlipidemia and medication access.    Subjective:  Care Team: Primary Care Provider: Billie Lade, MD ; Next Scheduled Visit: 04/2023  Medication Access/Adherence  Current Pharmacy:  [id number] [place], [place] - [address] AT [facility] [address] [place] [address] Phone: [phone] Fax: [phone]  [facility] - [place], [place] - [address] [place] [address] Phone: [phone] Fax: [phone]  [facility] - [place], [place] - [address] [facility] AT Portal to [facility] [address] [place] [address] Phone: [phone] Fax: [phone]   Patient reports affordability concerns with their medications: Yes  Patient reports access/transportation concerns to their pharmacy: Yes  Patient reports adherence concerns with their medications:  Yes    Hyperlipidemia/ASCVD Risk Reduction  Current lipid lowering medications: Repatha, ezetimibe Medications tried in the past: atorvastatin (no myopathy- just streamlined regimen due to CVA) LDL-C is above goal of less than 55 due to premature CVA   Antiplatelet regimen: clopidogrel  ASCVD History: CVA Family History:  Risk Factors:  T2DM, CVA, HLD, HTN, obese. Diet/lifestyle  Current physical activity: encouraged as able  Current medication access support: [employer] grant for Repatha  PREVENT Risk Score: 10 year risk of CVD: 19.7% - 10 year risk of ASCVD: 9% - 10 year risk of HF: 28.9%   Objective:  Lab Results  Component Value Date   HGBA1C 5.7 (H) 03/16/2022    Lab Results  Component Value Date    CREATININE 2.09 (H) 12/05/2022   BUN 28 (H) 12/05/2022   NA 142 12/05/2022   K 4.2 12/05/2022   CL 107 (H) 12/05/2022   CO2 22 12/05/2022    Lab Results  Component Value Date   CHOL 147 03/16/2022   HDL 48 03/16/2022   LDLCALC 88 03/16/2022   LDLDIRECT 58 12/05/2022   TRIG 54 03/16/2022   CHOLHDL 3.1 03/16/2022    Medications Reviewed Today     Reviewed by Billie Lade, MD (Physician) on 01/02/23 at 1542  Med List Status: <None>   Medication Order Taking? Sig Documenting Provider Last Dose Status Informant  atorvastatin (LIPITOR) 80 MG tablet [id number] Yes Take 1 tablet (80 mg total) by mouth daily. Christell Constant, MD Taking Active   chlorthalidone (HYGROTON) 25 MG tablet [id number] Yes Take 1 tablet (25 mg total) by mouth daily. Billie Lade, MD Taking Active   cloNIDine (CATAPRES) 0.3 MG tablet [id number] Yes Take 1 tablet (0.3 mg total) by mouth 2 (two) times daily. Christell Constant, MD Taking Active   clopidogrel (PLAVIX) 75 MG tablet [id number] Yes Take 1 tablet (75 mg total) by mouth daily. Billie Lade, MD Taking Active   dapagliflozin propanediol (FARXIGA) 10 MG TABS tablet [id number] Yes Take 1 tablet (10 mg total) by mouth daily before breakfast. Billie Lade, MD Taking Active   Evolocumab [facility] SURECLICK) 140 MG/ML Ivory Broad [id number] Yes Inject 140 mg into the skin every 14 (fourteen) days. Christell Constant, MD Taking Active   ezetimibe (ZETIA) 10 MG tablet [id number] Yes Take 1 tablet (10 mg total) by mouth daily. Edwin Dada  R, FNP Taking Active   ferrous sulfate ([facility] IRON) 325 (65 FE) MG tablet [id number] Yes Take 1 tablet (325 mg total) by mouth daily. Billie Lade, MD Taking Active   furosemide (LASIX) 20 MG tablet [id number] Yes Take 1 tablet (20 mg total) by mouth daily. Christell Constant, MD Taking Active   glimepiride (AMARYL) 1 MG tablet [id number] Yes TAKE 1 TABLET BY MOUTH EVERY DAY WITH BREAKFAST Billie Lade,  MD Taking Active   hydrALAZINE (APRESOLINE) 100 MG tablet [id number] Yes Take 1 tablet (100 mg total) by mouth 3 (three) times daily. Billie Lade, MD Taking Active   metoprolol succinate (TOPROL-XL) 50 MG 24 hr tablet [id number] Yes Take 1 tablet (50 mg total) by mouth daily. Take with or immediately following a meal. Billie Lade, MD Taking Active   NIFEdipine (PROCARDIA XL) 60 MG 24 hr tablet [id number] Yes Take 1 tablet (60 mg total) by mouth daily. Billie Lade, MD Taking Active              Assessment/Plan: -per chart review, patient did not make it to  last Cardiology appt on 01/11/23 due to transportation; he has previously voiced issues re: transportation--will send concerns to LCSW -Repatha refill due now--[facility] to be used with ease; will call to refill with patient  -Still need ezetimibe? Will defer to cardiology -LDL direct 58 on 12/05/22; Goal LDL<55 due to CVA premature -Continue to follow with cardiology and PCP  Follow Up Plan:  PCP/cardiology  Kieth Brightly, PharmD, BCACP Clinical Pharmacist, [facility]

## 2023-01-24 DIAGNOSIS — I129 Hypertensive chronic kidney disease with stage 1 through stage 4 chronic kidney disease, or unspecified chronic kidney disease: Secondary | ICD-10-CM | POA: Diagnosis not present

## 2023-01-24 DIAGNOSIS — N1832 Chronic kidney disease, stage 3b: Secondary | ICD-10-CM | POA: Diagnosis not present

## 2023-01-24 DIAGNOSIS — R809 Proteinuria, unspecified: Secondary | ICD-10-CM | POA: Diagnosis not present

## 2023-01-24 DIAGNOSIS — E1122 Type 2 diabetes mellitus with diabetic chronic kidney disease: Secondary | ICD-10-CM | POA: Diagnosis not present

## 2023-01-31 ENCOUNTER — Other Ambulatory Visit: Payer: Self-pay | Admitting: Internal Medicine

## 2023-01-31 ENCOUNTER — Telehealth: Payer: Self-pay | Admitting: Internal Medicine

## 2023-01-31 DIAGNOSIS — I10 Essential (primary) hypertension: Secondary | ICD-10-CM

## 2023-01-31 NOTE — Telephone Encounter (Signed)
Brandon French called from [facility], verify patient if patient is still taking the olmesartan 20 mg. Call [facility] back [phone] ask for any person on the pharmacy team.

## 2023-01-31 NOTE — Telephone Encounter (Signed)
Returned call and representative would not transfer to pharmacy since patient was the only one on the account. Advised representative that the patient would have to call back.

## 2023-02-05 ENCOUNTER — Other Ambulatory Visit: Payer: Self-pay | Admitting: Internal Medicine

## 2023-02-05 DIAGNOSIS — I10 Essential (primary) hypertension: Secondary | ICD-10-CM

## 2023-02-07 ENCOUNTER — Other Ambulatory Visit: Payer: Self-pay | Admitting: Internal Medicine

## 2023-02-07 DIAGNOSIS — I10 Essential (primary) hypertension: Secondary | ICD-10-CM

## 2023-02-14 ENCOUNTER — Other Ambulatory Visit: Payer: No Typology Code available for payment source | Admitting: Pharmacist

## 2023-02-22 NOTE — Progress Notes (Signed)
   02/14/2023 Name: Brandon French MRN: [id number] DOB: 1965/11/03  Chief Complaint  Patient presents with   Hyperlipidemia    Multiple unsuccessful attempts to reach patient.  Please have patient contact me at [phone] if he needs pharmacy assistance.  Repatha is overdue for refill.  [facility] [employer] grant copay card they have on file for him  No further follow up unless contacted   Lipid Panel     Component Value Date/Time   CHOL 147 03/16/2022 1034   TRIG 54 03/16/2022 1034   HDL 48 03/16/2022 1034   CHOLHDL 3.1 03/16/2022 1034   CHOLHDL 4.8 05/17/2021 0408   VLDL 12 05/17/2021 0408   LDLCALC 88 03/16/2022 1034   LDLDIRECT 58 12/05/2022 1500   LABVLDL 11 03/16/2022 1034      Kieth Brightly, PharmD, BCACP Clinical Pharmacist, [facility]

## 2023-03-09 ENCOUNTER — Telehealth: Payer: Self-pay | Admitting: Internal Medicine

## 2023-03-09 ENCOUNTER — Other Ambulatory Visit: Payer: Self-pay

## 2023-03-09 DIAGNOSIS — I10 Essential (primary) hypertension: Secondary | ICD-10-CM

## 2023-03-09 DIAGNOSIS — E1122 Type 2 diabetes mellitus with diabetic chronic kidney disease: Secondary | ICD-10-CM

## 2023-03-09 DIAGNOSIS — I639 Cerebral infarction, unspecified: Secondary | ICD-10-CM

## 2023-03-09 DIAGNOSIS — E118 Type 2 diabetes mellitus with unspecified complications: Secondary | ICD-10-CM

## 2023-03-09 DIAGNOSIS — D508 Other iron deficiency anemias: Secondary | ICD-10-CM

## 2023-03-09 DIAGNOSIS — N1832 Chronic kidney disease, stage 3b: Secondary | ICD-10-CM

## 2023-03-09 MED ORDER — GLIMEPIRIDE 1 MG PO TABS
ORAL_TABLET | ORAL | 1 refills | Status: AC
Start: 2023-03-09 — End: ?

## 2023-03-09 MED ORDER — FERROUS SULFATE 325 (65 FE) MG PO TABS
325.0000 mg | ORAL_TABLET | Freq: Every day | ORAL | 0 refills | Status: AC
Start: 2023-03-09 — End: ?

## 2023-03-09 MED ORDER — DAPAGLIFLOZIN PROPANEDIOL 10 MG PO TABS
10.0000 mg | ORAL_TABLET | Freq: Every day | ORAL | 0 refills | Status: AC
Start: 2023-03-09 — End: ?

## 2023-03-09 MED ORDER — CLOPIDOGREL BISULFATE 75 MG PO TABS
75.0000 mg | ORAL_TABLET | Freq: Every day | ORAL | 0 refills | Status: AC
Start: 2023-03-09 — End: ?

## 2023-03-09 MED ORDER — CHLORTHALIDONE 25 MG PO TABS
25.0000 mg | ORAL_TABLET | Freq: Every day | ORAL | 0 refills | Status: AC
Start: 2023-03-09 — End: 2023-06-07

## 2023-03-09 MED ORDER — NIFEDIPINE ER OSMOTIC RELEASE 60 MG PO TB24
60.0000 mg | ORAL_TABLET | Freq: Every day | ORAL | 2 refills | Status: AC
Start: 2023-03-09 — End: 2023-06-07

## 2023-03-09 MED ORDER — METOPROLOL SUCCINATE ER 50 MG PO TB24
50.0000 mg | ORAL_TABLET | Freq: Every day | ORAL | 0 refills | Status: AC
Start: 2023-03-09 — End: ?

## 2023-03-09 NOTE — Telephone Encounter (Signed)
Patient calling says he needs a refill on "all medications except for the one he takes 3 times a day".  Please advise, thank you

## 2023-03-09 NOTE — Telephone Encounter (Signed)
Refills sent

## 2023-03-12 ENCOUNTER — Other Ambulatory Visit: Payer: Self-pay | Admitting: Internal Medicine

## 2023-03-12 DIAGNOSIS — I10 Essential (primary) hypertension: Secondary | ICD-10-CM

## 2023-03-15 DIAGNOSIS — D638 Anemia in other chronic diseases classified elsewhere: Secondary | ICD-10-CM | POA: Diagnosis not present

## 2023-03-15 DIAGNOSIS — N1832 Chronic kidney disease, stage 3b: Secondary | ICD-10-CM | POA: Diagnosis not present

## 2023-03-15 DIAGNOSIS — N189 Chronic kidney disease, unspecified: Secondary | ICD-10-CM | POA: Diagnosis not present

## 2023-03-15 DIAGNOSIS — R809 Proteinuria, unspecified: Secondary | ICD-10-CM | POA: Diagnosis not present

## 2023-03-15 DIAGNOSIS — I129 Hypertensive chronic kidney disease with stage 1 through stage 4 chronic kidney disease, or unspecified chronic kidney disease: Secondary | ICD-10-CM | POA: Diagnosis not present

## 2023-03-30 DIAGNOSIS — R809 Proteinuria, unspecified: Secondary | ICD-10-CM | POA: Diagnosis not present

## 2023-03-30 DIAGNOSIS — N1832 Chronic kidney disease, stage 3b: Secondary | ICD-10-CM | POA: Diagnosis not present

## 2023-03-30 DIAGNOSIS — E1122 Type 2 diabetes mellitus with diabetic chronic kidney disease: Secondary | ICD-10-CM | POA: Diagnosis not present

## 2023-03-30 DIAGNOSIS — I129 Hypertensive chronic kidney disease with stage 1 through stage 4 chronic kidney disease, or unspecified chronic kidney disease: Secondary | ICD-10-CM | POA: Diagnosis not present

## 2023-04-03 ENCOUNTER — Other Ambulatory Visit (HOSPITAL_COMMUNITY): Payer: Self-pay | Admitting: Nephrology

## 2023-04-03 DIAGNOSIS — E1122 Type 2 diabetes mellitus with diabetic chronic kidney disease: Secondary | ICD-10-CM

## 2023-04-03 DIAGNOSIS — N1832 Chronic kidney disease, stage 3b: Secondary | ICD-10-CM

## 2023-04-03 DIAGNOSIS — R809 Proteinuria, unspecified: Secondary | ICD-10-CM

## 2023-04-04 ENCOUNTER — Ambulatory Visit: Payer: No Typology Code available for payment source | Admitting: Internal Medicine

## 2023-04-10 ENCOUNTER — Ambulatory Visit (HOSPITAL_COMMUNITY)
Admission: RE | Admit: 2023-04-10 | Discharge: 2023-04-10 | Disposition: A | Discharge status: Home / Self Care | Payer: No Typology Code available for payment source | Source: Ambulatory Visit | Attending: Nephrology | Admitting: Nephrology

## 2023-04-10 DIAGNOSIS — E1122 Type 2 diabetes mellitus with diabetic chronic kidney disease: Secondary | ICD-10-CM | POA: Insufficient documentation

## 2023-04-10 DIAGNOSIS — N186 End stage renal disease: Secondary | ICD-10-CM | POA: Diagnosis not present

## 2023-04-10 DIAGNOSIS — Z992 Dependence on renal dialysis: Secondary | ICD-10-CM | POA: Diagnosis not present

## 2023-04-10 DIAGNOSIS — R809 Proteinuria, unspecified: Secondary | ICD-10-CM | POA: Insufficient documentation

## 2023-04-10 DIAGNOSIS — N1832 Chronic kidney disease, stage 3b: Secondary | ICD-10-CM | POA: Insufficient documentation

## 2023-04-10 DIAGNOSIS — N189 Chronic kidney disease, unspecified: Secondary | ICD-10-CM | POA: Diagnosis not present

## 2023-04-13 NOTE — Telephone Encounter (Signed)
Please send refills for Farxiga to [employer] if pt is still requiring assistance for 2025.

## 2023-04-18 ENCOUNTER — Other Ambulatory Visit: Payer: No Typology Code available for payment source | Admitting: Pharmacist

## 2023-05-03 ENCOUNTER — Encounter: Payer: Self-pay | Admitting: Internal Medicine

## 2023-05-03 ENCOUNTER — Ambulatory Visit: Payer: No Typology Code available for payment source | Admitting: Internal Medicine

## 2023-05-03 VITALS — BP 124/74 | HR 76 | Resp 16 | Ht 72.0 in | Wt 309.2 lb

## 2023-05-03 DIAGNOSIS — I639 Cerebral infarction, unspecified: Secondary | ICD-10-CM

## 2023-05-03 DIAGNOSIS — Z7984 Long term (current) use of oral hypoglycemic drugs: Secondary | ICD-10-CM

## 2023-05-03 DIAGNOSIS — Z1159 Encounter for screening for other viral diseases: Secondary | ICD-10-CM

## 2023-05-03 DIAGNOSIS — E1122 Type 2 diabetes mellitus with diabetic chronic kidney disease: Secondary | ICD-10-CM | POA: Diagnosis not present

## 2023-05-03 DIAGNOSIS — D508 Other iron deficiency anemias: Secondary | ICD-10-CM

## 2023-05-03 DIAGNOSIS — Z2821 Immunization not carried out because of patient refusal: Secondary | ICD-10-CM | POA: Diagnosis not present

## 2023-05-03 DIAGNOSIS — N1832 Chronic kidney disease, stage 3b: Secondary | ICD-10-CM

## 2023-05-03 DIAGNOSIS — E118 Type 2 diabetes mellitus with unspecified complications: Secondary | ICD-10-CM

## 2023-05-03 DIAGNOSIS — E782 Mixed hyperlipidemia: Secondary | ICD-10-CM

## 2023-05-03 DIAGNOSIS — I10 Essential (primary) hypertension: Secondary | ICD-10-CM | POA: Diagnosis not present

## 2023-05-03 MED ORDER — FUROSEMIDE 20 MG PO TABS: 20.0000 mg | ORAL_TABLET | Freq: Every day | ORAL | 3 refills | Status: DC

## 2023-05-03 MED ORDER — FERROUS SULFATE 325 (65 FE) MG PO TABS
325.0000 mg | ORAL_TABLET | Freq: Every day | ORAL | 0 refills | Status: DC
Start: 2023-05-03 — End: 2023-08-02

## 2023-05-03 MED ORDER — ATORVASTATIN CALCIUM 80 MG PO TABS
80.0000 mg | ORAL_TABLET | Freq: Every day | ORAL | 3 refills | Status: DC
Start: 2023-05-03 — End: 2024-05-12

## 2023-05-03 MED ORDER — NIFEDIPINE ER OSMOTIC RELEASE 60 MG PO TB24
60.0000 mg | ORAL_TABLET | Freq: Every day | ORAL | 2 refills | Status: DC
Start: 2023-05-03 — End: 2023-08-07

## 2023-05-03 MED ORDER — HYDRALAZINE HCL 100 MG PO TABS
100.0000 mg | ORAL_TABLET | Freq: Three times a day (TID) | ORAL | 0 refills | Status: DC
Start: 2023-05-03 — End: 2023-12-06

## 2023-05-03 MED ORDER — REPATHA SURECLICK 140 MG/ML ~~LOC~~ SOAJ: 1.0000 mL | SUBCUTANEOUS | 11 refills | Status: DC

## 2023-05-03 MED ORDER — METOPROLOL SUCCINATE ER 50 MG PO TB24
50.0000 mg | ORAL_TABLET | Freq: Every day | ORAL | 0 refills | Status: DC
Start: 2023-05-03 — End: 2023-09-05

## 2023-05-03 MED ORDER — CHLORTHALIDONE 25 MG PO TABS
25.0000 mg | ORAL_TABLET | Freq: Every day | ORAL | 0 refills | Status: DC
Start: 2023-05-03 — End: 2023-08-07

## 2023-05-03 MED ORDER — GLIMEPIRIDE 1 MG PO TABS
ORAL_TABLET | ORAL | 1 refills | Status: DC
Start: 2023-05-03 — End: 2023-08-07

## 2023-05-03 MED ORDER — CLONIDINE HCL 0.3 MG PO TABS: 0.3000 mg | ORAL_TABLET | Freq: Two times a day (BID) | ORAL | 3 refills | Status: DC

## 2023-05-03 MED ORDER — DAPAGLIFLOZIN PROPANEDIOL 10 MG PO TABS
10.0000 mg | ORAL_TABLET | Freq: Every day | ORAL | 0 refills | Status: DC
Start: 2023-05-03 — End: 2023-09-05

## 2023-05-03 MED ORDER — EZETIMIBE 10 MG PO TABS
10.0000 mg | ORAL_TABLET | Freq: Every day | ORAL | 3 refills | Status: DC
Start: 2023-05-03 — End: 2024-05-09

## 2023-05-03 MED ORDER — CLOPIDOGREL BISULFATE 75 MG PO TABS
75.0000 mg | ORAL_TABLET | Freq: Every day | ORAL | 0 refills | Status: DC
Start: 2023-05-03 — End: 2023-08-31

## 2023-05-03 NOTE — Progress Notes (Signed)
Established Patient Office Visit  Subjective   Patient ID: Brandon French, male    DOB: 12/01/1965  Age: 57 y.o. MRN: [id number]  Chief Complaint  Patient presents with   Follow-up   Brandon French returns to care today for routine follow-up.  He was last evaluated by me on 6/4 for HTN follow-up.  No additional changes were made at that time and 8-month follow-up was arranged.  There have been no acute interval events.  [name] reports feeling well today.  He is asymptomatic and has no acute concerns to discuss.  Past Medical History:  Diagnosis Date   Chronic kidney disease    Diabetes mellitus (HCC)    Elevated cholesterol    High blood pressure    Past Surgical History:  Procedure Laterality Date   TRANSURETHRAL RESECTION OF PROSTATE  2020   Social History   Tobacco Use   Smoking status: Former    Current packs/day: 0.00    Average packs/day: 0.5 packs/day for 8.0 years (4.0 ttl pk-yrs)    Types: Cigarettes    Start date: 2003    Quit date: 2011    Years since quitting: 13.8   Smokeless tobacco: Never   Tobacco comments:    Pt stated that he smoked for about 8 years , he quit 2011.  Vaping Use   Vaping status: Never Used  Substance Use Topics   Alcohol use: Never   Drug use: Never   Family History  Problem Relation Age of Onset   Stroke Mother    Diabetes Mother    Cancer Mother    Diabetes Father    Kidney failure Father    Colon cancer Half-Brother    Lung cancer Half-Brother    Allergies  Allergen Reactions   Aleve [Naproxen]    Review of Systems  Constitutional:  Negative for chills and fever.  HENT:  Negative for sore throat.   Respiratory:  Negative for cough and shortness of breath.   Cardiovascular:  Negative for chest pain, palpitations and leg swelling.  Gastrointestinal:  Negative for abdominal pain, blood in stool, constipation, diarrhea, nausea and vomiting.  Genitourinary:  Negative for dysuria and hematuria.  Musculoskeletal:   Negative for myalgias.  Skin:  Negative for itching and rash.  Neurological:  Negative for dizziness and headaches.  Psychiatric/Behavioral:  Negative for depression and suicidal ideas.      Objective:     BP 124/74   Pulse 76   Resp 16   Ht 6' (1.829 m)   Wt (!) 309 lb 3.2 oz (140.3 kg)   SpO2 98%   BMI 41.94 kg/m  BP Readings from Last 3 Encounters:  05/03/23 124/74  01/02/23 123/69  12/05/22 (!) 160/74   Physical Exam Vitals reviewed.  Constitutional:      General: He is not in acute distress.    Appearance: Normal appearance. He is obese. He is not ill-appearing.  HENT:     Head: Normocephalic and atraumatic.     Right Ear: External ear normal.     Left Ear: External ear normal.     Nose: Nose normal. No congestion or rhinorrhea.     Mouth/Throat:     Mouth: Mucous membranes are moist.     Pharynx: Oropharynx is clear.  Eyes:     General: No scleral icterus.    Extraocular Movements: Extraocular movements intact.     Conjunctiva/sclera: Conjunctivae normal.     Pupils: Pupils are equal, round, and reactive to  light.  Cardiovascular:     Rate and Rhythm: Normal rate and regular rhythm.     Pulses: Normal pulses.     Heart sounds: Murmur heard.  Pulmonary:     Effort: Pulmonary effort is normal.     Breath sounds: Normal breath sounds. No wheezing, rhonchi or rales.  Abdominal:     General: Abdomen is flat. Bowel sounds are normal. There is no distension.     Palpations: Abdomen is soft.     Tenderness: There is no abdominal tenderness.  Musculoskeletal:        General: No swelling or deformity. Normal range of motion.     Cervical back: Normal range of motion.     Right lower leg: Edema present.     Left lower leg: Edema present.  Skin:    General: Skin is warm and dry.     Capillary Refill: Capillary refill takes less than 2 seconds.  Neurological:     General: No focal deficit present.     Mental Status: He is alert and oriented to person, place, and  time.     Motor: No weakness.     Gait: Gait abnormal.  Psychiatric:        Mood and Affect: Mood normal.        Behavior: Behavior normal.        Thought Content: Thought content normal.   Last CBC Lab Results  Component Value Date   WBC 7.1 05/03/2023   HGB 11.4 (L) 05/03/2023   HCT 37.9 05/03/2023   MCV 84 05/03/2023   MCH 25.2 (L) 05/03/2023   RDW 14.7 05/03/2023   PLT 403 05/03/2023   Last metabolic panel Lab Results  Component Value Date   GLUCOSE 85 05/03/2023   NA 146 (H) 05/03/2023   K 3.6 05/03/2023   CL 104 05/03/2023   CO2 30 (H) 05/03/2023   BUN 42 (H) 05/03/2023   CREATININE 2.39 (H) 05/03/2023   EGFR 31 (L) 05/03/2023   CALCIUM 9.1 05/03/2023   PHOS 2.9 05/19/2021   PROT 7.1 05/03/2023   ALBUMIN 3.7 (L) 05/03/2023   LABGLOB 3.4 05/03/2023   AGRATIO 1.2 03/16/2022   BILITOT 0.2 05/03/2023   ALKPHOS 71 05/03/2023   AST 21 05/03/2023   ALT 13 05/03/2023   ANIONGAP 5 06/06/2021   Last lipids Lab Results  Component Value Date   CHOL 176 05/03/2023   HDL 42 05/03/2023   LDLCALC 121 (H) 05/03/2023   LDLDIRECT 58 12/05/2022   TRIG 71 05/03/2023   CHOLHDL 4.2 05/03/2023   Last hemoglobin A1c Lab Results  Component Value Date   HGBA1C 5.3 05/03/2023   Last thyroid functions Lab Results  Component Value Date   TSH 1.200 05/03/2023   Last vitamin B12 and Folate Lab Results  Component Value Date   VITAMINB12 462 05/03/2023   FOLATE 10.2 05/03/2023     Assessment & Plan:   Problem List Items Addressed This Visit       Hypertension    Adequately controlled on current antihypertensive regimen consisting of Procardia XL 60 mg daily, chlorthalidone 25 mg daily, clonidine 0.3 mg twice daily, hydralazine 100 mg 3 times daily, Toprol-XL 50 mg daily, and Lasix 20 mg daily.      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  He remains on Plavix monotherapy and atorvastatin.  No  changes have been made today.  DMII (diabetes mellitus, type 2) (HCC) - Primary    A1c 5.7 on labs from August 2023.  He is currently prescribed glimepiride 1 mg daily and Farxiga 10 mg daily. -Repeat A1c and urine microalbumin/creatinine ratio ordered today      CKD (chronic kidney disease) stage 3, GFR 30-59 ml/min (HCC)    CKD 3B based on labs from May.  Previously referred to nephrology but has not established care.  Currently prescribed Farxiga 10 mg daily. -Repeat labs ordered today      HLD (hyperlipidemia)    Lipid panel updated in August 2023.  Total cholesterol 147 and LDL 88.  He is currently prescribed atorvastatin 80 mg daily, Repatha, and ezetimibe 10 mg daily. -Repeat lipid panel ordered today.      Return in about 3 months (around 08/03/2023).   Billie Lade, MD

## 2023-05-03 NOTE — Patient Instructions (Signed)
It was a pleasure to see you today.  Thank you for giving [place] the opportunity to be involved in your care.  Below is a brief recap of your visit and next steps.  We will plan to see you again in 3 months.  Summary Medications refilled Repeat labs ordered

## 2023-05-04 LAB — CMP14+EGFR
ALT: 13 [IU]/L (ref 0–44)
AST: 21 [IU]/L (ref 0–40)
Albumin: 3.7 g/dL — ABNORMAL LOW (ref 3.8–4.9)
Alkaline Phosphatase: 71 [IU]/L (ref 44–121)
BUN/Creatinine Ratio: 18 (ref 9–20)
BUN: 42 mg/dL — ABNORMAL HIGH (ref 6–24)
Bilirubin Total: 0.2 mg/dL (ref 0.0–1.2)
CO2: 30 mmol/L — ABNORMAL HIGH (ref 20–29)
Calcium: 9.1 mg/dL (ref 8.7–10.2)
Chloride: 104 mmol/L (ref 96–106)
Creatinine, Ser: 2.39 mg/dL — ABNORMAL HIGH (ref 0.76–1.27)
Globulin, Total: 3.4 g/dL (ref 1.5–4.5)
Glucose: 85 mg/dL (ref 70–99)
Potassium: 3.6 mmol/L (ref 3.5–5.2)
Sodium: 146 mmol/L — ABNORMAL HIGH (ref 134–144)
Total Protein: 7.1 g/dL (ref 6.0–8.5)
eGFR: 31 mL/min/{1.73_m2} — ABNORMAL LOW (ref >59)

## 2023-05-04 LAB — CBC WITH DIFFERENTIAL/PLATELET
Basophils Absolute: 0.1 10*3/uL (ref 0.0–0.2)
Basos: 1 %
EOS (ABSOLUTE): 0.3 10*3/uL (ref 0.0–0.4)
Eos: 4 %
Hematocrit: 37.9 % (ref 37.5–51.0)
Hemoglobin: 11.4 g/dL — ABNORMAL LOW (ref 13.0–17.7)
Immature Grans (Abs): 0 10*3/uL (ref 0.0–0.1)
Immature Granulocytes: 0 %
Lymphocytes Absolute: 1.6 10*3/uL (ref 0.7–3.1)
Lymphs: 23 %
MCH: 25.2 pg — ABNORMAL LOW (ref 26.6–33.0)
MCHC: 30.1 g/dL — ABNORMAL LOW (ref 31.5–35.7)
MCV: 84 fL (ref 79–97)
Monocytes Absolute: 0.7 10*3/uL (ref 0.1–0.9)
Monocytes: 9 %
Neutrophils Absolute: 4.5 10*3/uL (ref 1.4–7.0)
Neutrophils: 63 %
Platelets: 403 10*3/uL (ref 150–450)
RBC: 4.53 x10E6/uL (ref 4.14–5.80)
RDW: 14.7 % (ref 11.6–15.4)
WBC: 7.1 10*3/uL (ref 3.4–10.8)

## 2023-05-04 LAB — LIPID PANEL
Chol/HDL Ratio: 4.2 {ratio} (ref 0.0–5.0)
Cholesterol, Total: 176 mg/dL (ref 100–199)
HDL: 42 mg/dL (ref >39)
LDL Chol Calc (NIH): 121 mg/dL — ABNORMAL HIGH (ref 0–99)
Triglycerides: 71 mg/dL (ref 0–149)
VLDL Cholesterol Cal: 13 mg/dL (ref 5–40)

## 2023-05-04 LAB — HCV AB W REFLEX TO QUANT PCR: HCV Ab: NONREACTIVE

## 2023-05-04 LAB — TSH+FREE T4
Free T4: 1.23 ng/dL (ref 0.82–1.77)
TSH: 1.2 u[IU]/mL (ref 0.450–4.500)

## 2023-05-04 LAB — B12 AND FOLATE PANEL
Folate: 10.2 ng/mL (ref >3.0)
Vitamin B-12: 462 pg/mL (ref 232–1245)

## 2023-05-04 LAB — VITAMIN D 25 HYDROXY (VIT D DEFICIENCY, FRACTURES): Vit D, 25-Hydroxy: 29.9 ng/mL — ABNORMAL LOW (ref 30.0–100.0)

## 2023-05-04 LAB — HEMOGLOBIN A1C
Est. average glucose Bld gHb Est-mCnc: 105 mg/dL
Hgb A1c MFr Bld: 5.3 % (ref 4.8–5.6)

## 2023-05-04 LAB — HCV INTERPRETATION

## 2023-05-20 ENCOUNTER — Encounter: Payer: Self-pay | Admitting: Internal Medicine

## 2023-05-20 NOTE — Assessment & Plan Note (Signed)
Lipid panel updated in August 2023.  Total cholesterol 147 and LDL 88.  He is currently prescribed atorvastatin 80 mg daily, Repatha, and ezetimibe 10 mg daily. -Repeat lipid panel ordered today.

## 2023-05-20 NOTE — Assessment & Plan Note (Signed)
CKD 3B based on labs from May.  Previously referred to nephrology but has not established care.  Currently prescribed Farxiga 10 mg daily. -Repeat labs ordered today

## 2023-05-20 NOTE — Assessment & Plan Note (Signed)
A1c 5.7 on labs from August 2023.  He is currently prescribed glimepiride 1 mg daily and Farxiga 10 mg daily. -Repeat A1c and urine microalbumin/creatinine ratio ordered today

## 2023-05-20 NOTE — Assessment & Plan Note (Signed)
History of CVA in October 2022 (left internal capsule/lateral thalamus).  He remains on Plavix monotherapy and atorvastatin.  No changes have been made today.

## 2023-05-20 NOTE — Assessment & Plan Note (Signed)
Adequately controlled on current antihypertensive regimen consisting of Procardia XL 60 mg daily, chlorthalidone 25 mg daily, clonidine 0.3 mg twice daily, hydralazine 100 mg 3 times daily, Toprol-XL 50 mg daily, and Lasix 20 mg daily.

## 2023-08-02 ENCOUNTER — Other Ambulatory Visit: Payer: Self-pay | Admitting: Internal Medicine

## 2023-08-02 DIAGNOSIS — D508 Other iron deficiency anemias: Secondary | ICD-10-CM

## 2023-08-03 ENCOUNTER — Telehealth: Payer: Self-pay

## 2023-08-03 NOTE — Telephone Encounter (Signed)
 PAP: PAP application for Farxiga , ([employer] ([employer] has been mailed to pt's home address on file. Will fax provider portion of application to provider's office when pt's portion is received.   Please note I have faxed the provider part of the application to Dr. Manus Fireman

## 2023-08-07 ENCOUNTER — Ambulatory Visit: Payer: No Typology Code available for payment source | Admitting: Internal Medicine

## 2023-08-07 ENCOUNTER — Encounter: Payer: Self-pay | Admitting: Internal Medicine

## 2023-08-07 VITALS — BP 119/60 | HR 74 | Ht 72.0 in | Wt 319.8 lb

## 2023-08-07 DIAGNOSIS — Z7984 Long term (current) use of oral hypoglycemic drugs: Secondary | ICD-10-CM | POA: Diagnosis not present

## 2023-08-07 DIAGNOSIS — N1832 Chronic kidney disease, stage 3b: Secondary | ICD-10-CM

## 2023-08-07 DIAGNOSIS — E1122 Type 2 diabetes mellitus with diabetic chronic kidney disease: Secondary | ICD-10-CM | POA: Diagnosis not present

## 2023-08-07 DIAGNOSIS — E119 Type 2 diabetes mellitus without complications: Secondary | ICD-10-CM

## 2023-08-07 DIAGNOSIS — I10 Essential (primary) hypertension: Secondary | ICD-10-CM

## 2023-08-07 DIAGNOSIS — E785 Hyperlipidemia, unspecified: Secondary | ICD-10-CM

## 2023-08-07 DIAGNOSIS — E1169 Type 2 diabetes mellitus with other specified complication: Secondary | ICD-10-CM | POA: Diagnosis not present

## 2023-08-07 MED ORDER — CHLORTHALIDONE 25 MG PO TABS: 25.0000 mg | ORAL_TABLET | Freq: Every day | ORAL | 3 refills | Status: DC

## 2023-08-07 MED ORDER — NIFEDIPINE ER OSMOTIC RELEASE 60 MG PO TB24
60.0000 mg | ORAL_TABLET | Freq: Every day | ORAL | 3 refills | Status: DC
Start: 2023-08-07 — End: 2024-06-09

## 2023-08-07 MED ORDER — REPATHA SURECLICK 140 MG/ML ~~LOC~~ SOAJ: 1.0000 mL | SUBCUTANEOUS | 11 refills | Status: DC

## 2023-08-07 MED ORDER — CHLORTHALIDONE 25 MG PO TABS
25.0000 mg | ORAL_TABLET | Freq: Every day | ORAL | 3 refills | Status: DC
Start: 2023-08-07 — End: 2024-06-09

## 2023-08-07 MED ORDER — NIFEDIPINE ER OSMOTIC RELEASE 60 MG PO TB24
60.0000 mg | ORAL_TABLET | Freq: Every day | ORAL | 3 refills | Status: DC
Start: 2023-08-07 — End: 2023-08-07

## 2023-08-07 NOTE — Assessment & Plan Note (Signed)
 A1c 5.3 on labs from October.  He is currently prescribed glimepiride  1 mg daily and Farxiga  10 mg daily. -Discontinue glimepiride  given well-controlled A1c and potential risk for hypoglycemia.  Continue Farxiga  10 mg daily. -Urine microalbumin/creatinine ratio ordered today -Repeat A1c at follow-up in 3 months

## 2023-08-07 NOTE — Progress Notes (Signed)
 Established Patient Office Visit  Subjective   Patient ID: Brandon French, male    DOB: 1966-03-06  Age: 58 y.o. MRN: [id number]  Chief Complaint  Patient presents with   Diabetes    Three month follow up    [name] returns to care today for routine follow-up.  He was last evaluated by me in October 2024.  No medication changes were made at that time, repeat labs were ordered, and 4-month follow-up was arranged.  There have been no acute interval events.  [name] reports feeling well today.  He is asymptomatic and has no acute concerns to discuss.  Past Medical History:  Diagnosis Date   Chronic kidney disease    Diabetes mellitus (HCC)    Elevated cholesterol    High blood pressure    Past Surgical History:  Procedure Laterality Date   TRANSURETHRAL RESECTION OF PROSTATE  2020   Social History   Tobacco Use   Smoking status: Former    Current packs/day: 0.00    Average packs/day: 0.5 packs/day for 8.0 years (4.0 ttl pk-yrs)    Types: Cigarettes    Start date: 2003    Quit date: 2011    Years since quitting: 14.0   Smokeless tobacco: Never   Tobacco comments:    Pt stated that he smoked for about 8 years , he quit 2011.  Vaping Use   Vaping status: Never Used  Substance Use Topics   Alcohol use: Never   Drug use: Never   Family History  Problem Relation Age of Onset   Stroke Mother    Diabetes Mother    Cancer Mother    Diabetes Father    Kidney failure Father    Colon cancer Half-Brother    Lung cancer Half-Brother    Allergies  Allergen Reactions   Aleve [Naproxen]    Review of Systems  Constitutional:  Negative for chills and fever.  HENT:  Negative for sore throat.   Respiratory:  Negative for cough and shortness of breath.   Cardiovascular:  Negative for chest pain, palpitations and leg swelling.  Gastrointestinal:  Negative for abdominal pain, blood in stool, constipation, diarrhea, nausea and vomiting.  Genitourinary:  Negative for  dysuria and hematuria.  Musculoskeletal:  Negative for myalgias.  Skin:  Negative for itching and rash.  Neurological:  Negative for dizziness and headaches.  Psychiatric/Behavioral:  Negative for depression and suicidal ideas.      Objective:     BP 119/60 (BP Location: Left Arm, Patient Position: Sitting, Cuff Size: Large)   Pulse 74   Ht 6' (1.829 m)   Wt (!) 319 lb 12.8 oz (145.1 kg)   SpO2 96%   BMI 43.37 kg/m  BP Readings from Last 3 Encounters:  08/07/23 119/60  05/03/23 124/74  01/02/23 123/69   Physical Exam Vitals reviewed.  Constitutional:      General: He is not in acute distress.    Appearance: Normal appearance. He is obese. He is not ill-appearing.  HENT:     Head: Normocephalic and atraumatic.     Right Ear: External ear normal.     Left Ear: External ear normal.     Nose: Nose normal. No congestion or rhinorrhea.     Mouth/Throat:     Mouth: Mucous membranes are moist.     Pharynx: Oropharynx is clear.  Eyes:     General: No scleral icterus.    Extraocular Movements: Extraocular movements intact.  Conjunctiva/sclera: Conjunctivae normal.     Pupils: Pupils are equal, round, and reactive to light.  Cardiovascular:     Rate and Rhythm: Normal rate and regular rhythm.     Pulses: Normal pulses.     Heart sounds: Murmur heard.  Pulmonary:     Effort: Pulmonary effort is normal.     Breath sounds: Normal breath sounds. No wheezing, rhonchi or rales.  Abdominal:     General: Abdomen is flat. Bowel sounds are normal. There is no distension.     Palpations: Abdomen is soft.     Tenderness: There is no abdominal tenderness.  Musculoskeletal:        General: No swelling or deformity. Normal range of motion.     Cervical back: Normal range of motion.     Right lower leg: Edema present.     Left lower leg: Edema present.  Skin:    General: Skin is warm and dry.     Capillary Refill: Capillary refill takes less than 2 seconds.  Neurological:      General: No focal deficit present.     Mental Status: He is alert and oriented to person, place, and time.     Motor: No weakness.     Gait: Gait abnormal.  Psychiatric:        Mood and Affect: Mood normal.        Behavior: Behavior normal.        Thought Content: Thought content normal.   Last CBC Lab Results  Component Value Date   WBC 7.1 05/03/2023   HGB 11.4 (L) 05/03/2023   HCT 37.9 05/03/2023   MCV 84 05/03/2023   MCH 25.2 (L) 05/03/2023   RDW 14.7 05/03/2023   PLT 403 05/03/2023   Last metabolic panel Lab Results  Component Value Date   GLUCOSE 85 05/03/2023   NA 146 (H) 05/03/2023   K 3.6 05/03/2023   CL 104 05/03/2023   CO2 30 (H) 05/03/2023   BUN 42 (H) 05/03/2023   CREATININE 2.39 (H) 05/03/2023   EGFR 31 (L) 05/03/2023   CALCIUM  9.1 05/03/2023   PHOS 2.9 05/19/2021   PROT 7.1 05/03/2023   ALBUMIN 3.7 (L) 05/03/2023   LABGLOB 3.4 05/03/2023   AGRATIO 1.2 03/16/2022   BILITOT 0.2 05/03/2023   ALKPHOS 71 05/03/2023   AST 21 05/03/2023   ALT 13 05/03/2023   ANIONGAP 5 06/06/2021   Last lipids Lab Results  Component Value Date   CHOL 176 05/03/2023   HDL 42 05/03/2023   LDLCALC 121 (H) 05/03/2023   LDLDIRECT 58 12/05/2022   TRIG 71 05/03/2023   CHOLHDL 4.2 05/03/2023   Last hemoglobin A1c Lab Results  Component Value Date   HGBA1C 5.3 05/03/2023   Last thyroid  functions Lab Results  Component Value Date   TSH 1.200 05/03/2023   Last vitamin D  Lab Results  Component Value Date   VD25OH 29.9 (L) 05/03/2023   Last vitamin B12 and Folate Lab Results  Component Value Date   VITAMINB12 462 05/03/2023   FOLATE 10.2 05/03/2023     Assessment & Plan:   Problem List Items Addressed This Visit       Hypertension   Remains adequately controlled on current antihypertensive regimen, which consists of Procardia  XL 60 mg daily, chlorthalidone  25 mg daily, clonidine  0.3 mg twice daily, hydralazine  100 mg 3 times daily, and Toprol -XL 50 mg daily.   No changes are indicated today.      DMII (diabetes mellitus,  type 2) (HCC)   A1c 5.3 on labs from October.  He is currently prescribed glimepiride  1 mg daily and Farxiga  10 mg daily. -Discontinue glimepiride  given well-controlled A1c and potential risk for hypoglycemia.  Continue Farxiga  10 mg daily. -Urine microalbumin/creatinine ratio ordered today -Repeat A1c at follow-up in 3 months      CKD (chronic kidney disease) stage 3, GFR 30-59 ml/min (HCC)   CKD 3B.  Followed by nephrology.  Currently prescribed Farxiga  10 mg daily.  Nephrology follow-up is scheduled for later this month (1/24).      Dyslipidemia   Lipid panel updated in October.  Total cholesterol 176 and LDL 121.  He is currently prescribed atorvastatin  80 mg daily, Zetia  10 mg daily, and Repatha .  He previously reported that he had been out of atorvastatin  prior to labs being obtained in October.  This was refilled.  Currently reports that he is out of Repatha . -Medications refilled today.  Repeat lipid panel at follow-up in 3 months.      Return in about 3 months (around 11/05/2023).   Manus FORBES Fireman, MD

## 2023-08-07 NOTE — Assessment & Plan Note (Signed)
 Remains adequately controlled on current antihypertensive regimen, which consists of Procardia  XL 60 mg daily, chlorthalidone  25 mg daily, clonidine  0.3 mg twice daily, hydralazine  100 mg 3 times daily, and Toprol -XL 50 mg daily.  No changes are indicated today.

## 2023-08-07 NOTE — Assessment & Plan Note (Signed)
 Lipid panel updated in October.  Total cholesterol 176 and LDL 121.  He is currently prescribed atorvastatin  80 mg daily, Zetia  10 mg daily, and Repatha .  He previously reported that he had been out of atorvastatin  prior to labs being obtained in October.  This was refilled.  Currently reports that he is out of Repatha . -Medications refilled today.  Repeat lipid panel at follow-up in 3 months.

## 2023-08-07 NOTE — Patient Instructions (Signed)
 It was a pleasure to see you today.  Thank you for giving [place] the opportunity to be involved in your care.  Below is a brief recap of your visit and next steps.  We will plan to see you again in 3 months.  Summary No medication changes today Refills provided Follow up in 3 months

## 2023-08-07 NOTE — Assessment & Plan Note (Signed)
 CKD 3B.  Followed by nephrology.  Currently prescribed Farxiga 10 mg daily.  Nephrology follow-up is scheduled for later this month (1/24).

## 2023-08-08 ENCOUNTER — Other Ambulatory Visit: Payer: Self-pay | Admitting: Internal Medicine

## 2023-08-08 NOTE — Telephone Encounter (Signed)
 Copied from CRM [id number]. Topic: Clinical - Medication Refill >> Aug 08, 2023  2:58 PM Chase BROCKS wrote: Most Recent Primary Care Visit:  Provider: DIXON, PHILLIP E  Department: [facility]  Visit Type: OFFICE VISIT  Date: 08/07/2023  Medication: Evolocumab  (REPATHA  SURECLICK) 140 MG/ML SOAJ  Has the patient contacted their pharmacy? Yes (Agent: If no, request that the patient contact the pharmacy for the refill. If patient does not wish to contact the pharmacy document the reason why and proceed with request.) (Agent: If yes, when and what did the pharmacy advise?)  Is this the correct pharmacy for this prescription? Yes If no, delete pharmacy and type the correct one.  This is the patient's preferred pharmacy:  [facility]  [address] [place], [place] [address] Phone: [phone]    Has the prescription been filled recently? Yes  Is the patient out of the medication? Yes  Has the patient been seen for an appointment in the last year OR does the patient have an upcoming appointment? Yes  Can we respond through MyChart? Yes  Agent: Please be advised that Rx refills may take up to 3 business days. We ask that you follow-up with your pharmacy.

## 2023-08-13 NOTE — Telephone Encounter (Signed)
 PAP: Application for Marcelline Deist has been submitted to [employer] ([employer]), via fax

## 2023-08-17 DIAGNOSIS — N189 Chronic kidney disease, unspecified: Secondary | ICD-10-CM | POA: Diagnosis not present

## 2023-08-17 DIAGNOSIS — E211 Secondary hyperparathyroidism, not elsewhere classified: Secondary | ICD-10-CM | POA: Diagnosis not present

## 2023-08-17 DIAGNOSIS — R809 Proteinuria, unspecified: Secondary | ICD-10-CM | POA: Diagnosis not present

## 2023-08-17 DIAGNOSIS — D631 Anemia in chronic kidney disease: Secondary | ICD-10-CM | POA: Diagnosis not present

## 2023-08-17 NOTE — Telephone Encounter (Signed)
PAP: Patient assistance application Brandon French for has been approved by PAP Companies: [employer] from 08/01/2023 to 07/30/2024. Medication should be delivered to PAP Delivery: Home For further shipping updates, please contact [employer] ([employer]) at [phone] Pt ID is: [id number]

## 2023-08-17 NOTE — Progress Notes (Signed)
Pharmacy Medication Assistance Program Note    08/17/2023  Patient ID: Brandon French, male   DOB: Jun 22, 1966, 58 y.o.   MRN: [id number]     08/03/2023  Outreach Medication One  Manufacturer Medication One Nurse, adult Drugs Farxiga  Dose of Farxiga 10mg /day  Type of Assistance Manufacturer Assistance  Date Application Sent to Patient 08/03/2023  Application Items Requested Application  Date Application Sent to Prescriber 08/03/2023  Name of Prescriber Christel Mormon  Date Application Received From Patient 08/13/2023  Application Items Received From Patient Application  Date Application Received From Provider 08/06/2023  Date Application Submitted to Manufacturer 08/13/2023  Patient Assistance Determination Approved  Approval Start Date 08/01/2023  Approval End Date 07/30/2024  Patient Notification Method MyChart     Signature Tresea Mall, CPHT/Patient Advocate [place] Direct Line: [phone] Fax: [phone]

## 2023-08-31 ENCOUNTER — Other Ambulatory Visit: Payer: Self-pay | Admitting: Internal Medicine

## 2023-08-31 DIAGNOSIS — I639 Cerebral infarction, unspecified: Secondary | ICD-10-CM

## 2023-09-05 ENCOUNTER — Other Ambulatory Visit: Payer: Self-pay | Admitting: Internal Medicine

## 2023-09-05 ENCOUNTER — Other Ambulatory Visit: Payer: Self-pay

## 2023-09-05 ENCOUNTER — Ambulatory Visit: Payer: Self-pay | Admitting: Internal Medicine

## 2023-09-05 DIAGNOSIS — N1832 Chronic kidney disease, stage 3b: Secondary | ICD-10-CM

## 2023-09-05 DIAGNOSIS — E1122 Type 2 diabetes mellitus with diabetic chronic kidney disease: Secondary | ICD-10-CM

## 2023-09-05 DIAGNOSIS — I10 Essential (primary) hypertension: Secondary | ICD-10-CM

## 2023-09-05 DIAGNOSIS — D508 Other iron deficiency anemias: Secondary | ICD-10-CM

## 2023-09-05 MED ORDER — DAPAGLIFLOZIN PROPANEDIOL 10 MG PO TABS: 10.0000 mg | ORAL_TABLET | Freq: Every day | ORAL | 0 refills | Status: DC

## 2023-09-05 MED ORDER — FERROUS SULFATE 325 (65 FE) MG PO TABS: 325.0000 mg | ORAL_TABLET | Freq: Every day | ORAL | 0 refills | Status: DC

## 2023-09-05 MED ORDER — METOPROLOL SUCCINATE ER 50 MG PO TB24: 50.0000 mg | ORAL_TABLET | Freq: Every day | ORAL | 0 refills | Status: DC

## 2023-09-05 NOTE — Telephone Encounter (Signed)
Last Fill: metoprolol succinate: 05/03/23  ferrous sulfate: 08/02/23  Last OV: 08/07/23 Next OV: 11/05/23  Routing to provider for review/authorization.

## 2023-09-05 NOTE — Telephone Encounter (Signed)
 Copied from CRM [id number]. Topic: Clinical - Medication Question >> Sep 05, 2023  2:05 PM Montie POUR wrote: Reason for CRM: Ancel called and needs his Kidney medication refilled and he does not know the name of medication. I tried to look for it but no medication on list tells me it is for kidney. He has been out for 2 weeks   Chief Complaint: Medication Request Disposition: [] ED /[] Urgent Care (no appt availability in office) / [] Appointment(In office/virtual)/ []  [facility]/ [] Home Care/ [] Refused Recommended Disposition /[] [facility]/ [x]  Follow-up with PCP Additional Notes: Attempted to contact patient at this time, no answer, left voicemail.

## 2023-09-05 NOTE — Telephone Encounter (Signed)
 Copied from CRM [id number]. Topic: Clinical - Medication Refill >> Sep 05, 2023  2:07 PM Montie POUR wrote: Most Recent Primary Care Visit:  Provider: DIXON, PHILLIP E  Department: [facility]  Visit Type: OFFICE VISIT  Date: 08/07/2023  Medication: ferrous sulfate  325 (65 FE) MG tablet AND metoprolol  succinate (TOPROL -XL) 50 MG 24 hr tablet   Has the patient contacted their pharmacy? No (Agent: If no, request that the patient contact the pharmacy for the refill. If patient does not wish to contact the pharmacy document the reason why and proceed with request.) (Agent: If yes, when and what did the pharmacy advise?)  Is this the correct pharmacy for this prescription? Yes If no, delete pharmacy and type the correct one.  This is the patient's preferred pharmacy:   [id number] [place], [place] - [address] AT [facility] [address] [place]  [address] Phone: [phone] Fax: [phone]   Has the prescription been filled recently? No  Is the patient out of the medication? No 1 or 2 left  Has the patient been seen for an appointment in the last year OR does the patient have an upcoming appointment? Yes  Can we respond through MyChart? No  Agent: Please be advised that Rx refills may take up to 3 business days. We ask that you follow-up with your pharmacy.

## 2023-09-05 NOTE — Telephone Encounter (Signed)
 Refills sent to pharmacy.

## 2023-09-05 NOTE — Telephone Encounter (Signed)
2nd attempt to call pt no answer left voicemail

## 2023-09-12 DIAGNOSIS — E1122 Type 2 diabetes mellitus with diabetic chronic kidney disease: Secondary | ICD-10-CM | POA: Diagnosis not present

## 2023-09-12 DIAGNOSIS — R809 Proteinuria, unspecified: Secondary | ICD-10-CM | POA: Diagnosis not present

## 2023-09-12 DIAGNOSIS — I129 Hypertensive chronic kidney disease with stage 1 through stage 4 chronic kidney disease, or unspecified chronic kidney disease: Secondary | ICD-10-CM | POA: Diagnosis not present

## 2023-09-12 DIAGNOSIS — N1832 Chronic kidney disease, stage 3b: Secondary | ICD-10-CM | POA: Diagnosis not present

## 2023-09-13 ENCOUNTER — Other Ambulatory Visit: Payer: Self-pay

## 2023-09-13 DIAGNOSIS — D508 Other iron deficiency anemias: Secondary | ICD-10-CM

## 2023-09-13 MED ORDER — FERROUS SULFATE 325 (65 FE) MG PO TABS: 325.0000 mg | ORAL_TABLET | Freq: Every day | ORAL | 0 refills | Status: DC

## 2023-09-17 ENCOUNTER — Other Ambulatory Visit: Payer: Self-pay | Admitting: Internal Medicine

## 2023-09-17 DIAGNOSIS — D508 Other iron deficiency anemias: Secondary | ICD-10-CM

## 2023-09-17 MED ORDER — FERROUS SULFATE 325 (65 FE) MG PO TABS: 325.0000 mg | ORAL_TABLET | Freq: Every day | ORAL | 0 refills | Status: DC

## 2023-10-29 ENCOUNTER — Other Ambulatory Visit: Payer: Self-pay | Admitting: Internal Medicine

## 2023-10-29 DIAGNOSIS — E782 Mixed hyperlipidemia: Secondary | ICD-10-CM

## 2023-10-29 NOTE — Telephone Encounter (Signed)
 Patient has refills available on medication, atorvastatin. Cancelled reorder, closing encounter. Patient needs to call pharmacy for refill.

## 2023-10-29 NOTE — Telephone Encounter (Signed)
 Copied from CRM [id number]. Topic: Clinical - Medication Refill >> Oct 29, 2023 10:30 AM Shelah Lewandowsky wrote: Most Recent Primary Care Visit:  Provider: Billie Lade  Department: [facility]  Visit Type: OFFICE VISIT  Date: 08/07/2023  Medication: atorvastatin (LIPITOR) 80 MG tablet  Has the patient contacted their pharmacy? No (Agent: If no, request that the patient contact the pharmacy for the refill. If patient does not wish to contact the pharmacy document the reason why and proceed with request.) (Agent: If yes, when and what did the pharmacy advise?)  Is this the correct pharmacy for this prescription? Yes If no, delete pharmacy and type the correct one.  This is the patient's preferred pharmacy:  [id number] [place], [place] - [address] AT [facility] [address] [place] [address] Phone: [phone] Fax: [phone]   Has the prescription been filled recently? Yes  Is the patient out of the medication? Yes  Has the patient been seen for an appointment in the last year OR does the patient have an upcoming appointment? Yes  Can we respond through MyChart? Yes  Agent: Please be advised that Rx refills may take up to 3 business days. We ask that you follow-up with your pharmacy.

## 2023-11-05 ENCOUNTER — Ambulatory Visit: Payer: No Typology Code available for payment source | Admitting: Internal Medicine

## 2023-11-06 ENCOUNTER — Ambulatory Visit: Admitting: Internal Medicine

## 2023-12-06 ENCOUNTER — Other Ambulatory Visit: Payer: Self-pay | Admitting: Internal Medicine

## 2023-12-06 DIAGNOSIS — I10 Essential (primary) hypertension: Secondary | ICD-10-CM

## 2023-12-28 ENCOUNTER — Encounter: Payer: Self-pay | Admitting: Internal Medicine

## 2023-12-28 ENCOUNTER — Telehealth: Payer: Self-pay | Admitting: Pharmacy Technician

## 2023-12-28 ENCOUNTER — Ambulatory Visit: Admitting: Internal Medicine

## 2023-12-28 ENCOUNTER — Other Ambulatory Visit (HOSPITAL_COMMUNITY): Payer: Self-pay

## 2023-12-28 VITALS — BP 129/62 | HR 101 | Ht 72.0 in | Wt 307.4 lb

## 2023-12-28 DIAGNOSIS — E1122 Type 2 diabetes mellitus with diabetic chronic kidney disease: Secondary | ICD-10-CM

## 2023-12-28 DIAGNOSIS — I10 Essential (primary) hypertension: Secondary | ICD-10-CM | POA: Diagnosis not present

## 2023-12-28 DIAGNOSIS — N1832 Chronic kidney disease, stage 3b: Secondary | ICD-10-CM | POA: Diagnosis not present

## 2023-12-28 DIAGNOSIS — E782 Mixed hyperlipidemia: Secondary | ICD-10-CM | POA: Diagnosis not present

## 2023-12-28 MED ORDER — REPATHA SURECLICK 140 MG/ML ~~LOC~~ SOAJ
1.0000 mL | SUBCUTANEOUS | 11 refills | Status: AC
Start: 2023-12-28 — End: ?

## 2023-12-28 NOTE — Assessment & Plan Note (Signed)
 A1c 5.3 on labs from October 2024.  Glimepiride  was discontinued at his last appointment in the setting of well-controlled diabetes mellitus and potential risk for hypoglycemia.  He was continued on Farxiga  10 mg daily.  Repeat A1c ordered today.

## 2023-12-28 NOTE — Progress Notes (Signed)
 Established Patient Office Visit  Subjective   Patient ID: Brandon French, male    DOB: Apr 08, 1966  Age: 58 y.o. MRN: [id number]  Chief Complaint  Patient presents with   Diabetes    Three month follow up    Brandon French returns to care today for routine follow-up.  He was last evaluated by me on 1/7 at which time glimepiride  was discontinued in the setting of well-controlled diabetes mellitus and the potential risk for hypoglycemia.  No additional medication changes were made and 54-month follow-up was arranged.  In the interim he has been seen by nephrology for follow-up.  There have otherwise been no acute interval events.  Today he reports feeling well and has no acute concerns to discuss.  Past Medical History:  Diagnosis Date   Chronic kidney disease    Diabetes mellitus (HCC)    Elevated cholesterol    High blood pressure    Past Surgical History:  Procedure Laterality Date   TRANSURETHRAL RESECTION OF PROSTATE  2020   Social History   Tobacco Use   Smoking status: Former    Current packs/day: 0.00    Average packs/day: 0.5 packs/day for 8.0 years (4.0 ttl pk-yrs)    Types: Cigarettes    Start date: 2003    Quit date: 2011    Years since quitting: 14.4   Smokeless tobacco: Never   Tobacco comments:    Pt stated that he smoked for about 8 years , he quit 2011.  Vaping Use   Vaping status: Never Used  Substance Use Topics   Alcohol use: Never   Drug use: Never   Family History  Problem Relation Age of Onset   Stroke Mother    Diabetes Mother    Cancer Mother    Diabetes Father    Kidney failure Father    Colon cancer Half-Brother    Lung cancer Half-Brother    Allergies  Allergen Reactions   Aleve [Naproxen]    Review of Systems  Constitutional:  Negative for chills and fever.  HENT:  Negative for sore throat.   Respiratory:  Negative for cough and shortness of breath.   Cardiovascular:  Negative for chest pain, palpitations and leg swelling.   Gastrointestinal:  Negative for abdominal pain, blood in stool, constipation, diarrhea, nausea and vomiting.  Genitourinary:  Negative for dysuria and hematuria.  Musculoskeletal:  Negative for myalgias.  Skin:  Negative for itching and rash.  Neurological:  Negative for dizziness and headaches.  Psychiatric/Behavioral:  Negative for depression and suicidal ideas.      Objective:     BP 129/62   Pulse (!) 101   Ht 6' (1.829 m)   Wt (!) 307 lb 6.4 oz (139.4 kg)   SpO2 92%   BMI 41.69 kg/m  BP Readings from Last 3 Encounters:  12/28/23 129/62  08/07/23 119/60  05/03/23 124/74   Physical Exam Vitals reviewed.  Constitutional:      General: He is not in acute distress.    Appearance: Normal appearance. He is obese. He is not ill-appearing.  HENT:     Head: Normocephalic and atraumatic.     Right Ear: External ear normal.     Left Ear: External ear normal.     Nose: Nose normal. No congestion or rhinorrhea.     Mouth/Throat:     Mouth: Mucous membranes are moist.     Pharynx: Oropharynx is clear.  Eyes:     General: No scleral icterus.  Extraocular Movements: Extraocular movements intact.     Conjunctiva/sclera: Conjunctivae normal.     Pupils: Pupils are equal, round, and reactive to light.  Cardiovascular:     Rate and Rhythm: Normal rate and regular rhythm.     Pulses: Normal pulses.     Heart sounds: Murmur heard.  Pulmonary:     Effort: Pulmonary effort is normal.     Breath sounds: Normal breath sounds. No wheezing, rhonchi or rales.  Abdominal:     General: Abdomen is flat. Bowel sounds are normal. There is no distension.     Palpations: Abdomen is soft.     Tenderness: There is no abdominal tenderness.  Musculoskeletal:        General: No swelling or deformity. Normal range of motion.     Cervical back: Normal range of motion.  Skin:    General: Skin is warm and dry.     Capillary Refill: Capillary refill takes less than 2 seconds.  Neurological:      General: No focal deficit present.     Mental Status: He is alert and oriented to person, place, and time.     Motor: No weakness.     Gait: Gait abnormal.  Psychiatric:        Mood and Affect: Mood normal.        Behavior: Behavior normal.        Thought Content: Thought content normal.   Last CBC Lab Results  Component Value Date   WBC 7.1 05/03/2023   HGB 11.4 (L) 05/03/2023   HCT 37.9 05/03/2023   MCV 84 05/03/2023   MCH 25.2 (L) 05/03/2023   RDW 14.7 05/03/2023   PLT 403 05/03/2023   Last metabolic panel Lab Results  Component Value Date   GLUCOSE 85 05/03/2023   NA 146 (H) 05/03/2023   K 3.6 05/03/2023   CL 104 05/03/2023   CO2 30 (H) 05/03/2023   BUN 42 (H) 05/03/2023   CREATININE 2.39 (H) 05/03/2023   EGFR 31 (L) 05/03/2023   CALCIUM  9.1 05/03/2023   PHOS 2.9 05/19/2021   PROT 7.1 05/03/2023   ALBUMIN 3.7 (L) 05/03/2023   LABGLOB 3.4 05/03/2023   AGRATIO 1.2 03/16/2022   BILITOT 0.2 05/03/2023   ALKPHOS 71 05/03/2023   AST 21 05/03/2023   ALT 13 05/03/2023   ANIONGAP 5 06/06/2021   Last lipids Lab Results  Component Value Date   CHOL 176 05/03/2023   HDL 42 05/03/2023   LDLCALC 121 (H) 05/03/2023   LDLDIRECT 58 12/05/2022   TRIG 71 05/03/2023   CHOLHDL 4.2 05/03/2023   Last hemoglobin A1c Lab Results  Component Value Date   HGBA1C 5.3 05/03/2023   Last thyroid  functions Lab Results  Component Value Date   TSH 1.200 05/03/2023   Last vitamin D  Lab Results  Component Value Date   VD25OH 29.9 (L) 05/03/2023   Last vitamin B12 and Folate Lab Results  Component Value Date   VITAMINB12 462 05/03/2023   FOLATE 10.2 05/03/2023     Assessment & Plan:   Problem List Items Addressed This Visit       Hypertension - Primary   Adequately controlled on current antihypertensive regimen consisting of Procardia  XL 60 mg daily, chlorthalidone  25 mg daily, clonidine  0.3 mg twice daily, hydralazine  100 mg 3 times daily, and Toprol -XL 50 mg daily.   No medication changes are indicated at this time.      DMII (diabetes mellitus, type 2) (HCC)   A1c  5.3 on labs from October 2024.  Glimepiride  was discontinued at his last appointment in the setting of well-controlled diabetes mellitus and potential risk for hypoglycemia.  He was continued on Farxiga  10 mg daily.  Repeat A1c ordered today.      CKD (chronic kidney disease) stage 3, GFR 30-59 ml/min (HCC)   Renal function stable on labs from October 2024.  He was seen by nephrology for follow-up in February.  His next appointment is scheduled for 6/9.  He remains on Farxiga .  Repeat BMP ordered today.      HLD (hyperlipidemia)   Lipid panel updated in October 2024.  Total cholesterol 176 and LDL 121.  He is currently prescribed Repatha , atorvastatin  80 mg daily, and Zetia  10 mg daily.  Repeat lipid panel ordered today.      Return in about 6 months (around 06/29/2024).   Tobi Fortes, MD

## 2023-12-28 NOTE — Patient Instructions (Signed)
 It was a pleasure to see you today.  Thank you for giving us  the opportunity to be involved in your care.  Below is a brief recap of your visit and next steps.  We will plan to see you again in 6 months.  Summary No medication changes today. Repatha  refilled. Repeat labs ordered Follow up in 6 months

## 2023-12-28 NOTE — Assessment & Plan Note (Signed)
 Lipid panel updated in October 2024.  Total cholesterol 176 and LDL 121.  He is currently prescribed Repatha , atorvastatin  80 mg daily, and Zetia  10 mg daily.  Repeat lipid panel ordered today.

## 2023-12-28 NOTE — Assessment & Plan Note (Signed)
 Adequately controlled on current antihypertensive regimen consisting of Procardia  XL 60 mg daily, chlorthalidone  25 mg daily, clonidine  0.3 mg twice daily, hydralazine  100 mg 3 times daily, and Toprol -XL 50 mg daily.  No medication changes are indicated at this time.

## 2023-12-28 NOTE — Assessment & Plan Note (Signed)
 Renal function stable on labs from October 2024.  He was seen by nephrology for follow-up in February.  His next appointment is scheduled for 6/9.  He remains on Farxiga .  Repeat BMP ordered today.

## 2023-12-28 NOTE — Telephone Encounter (Signed)
 Pharmacy Patient Advocate Encounter  Received notification from [facility] that Prior Authorization for Repatha  SureClick 140MG /ML auto-injectors has been APPROVED from 12/28/2023 to 06/29/2024. Ran test claim, Copay is $47.00. This test claim was processed through [facility]- copay amounts may vary at other pharmacies due to pharmacy/plan contracts, or as the patient moves through the different stages of their insurance plan.   PA #/Case ID/Reference #: [id number]

## 2023-12-29 LAB — CBC WITH DIFFERENTIAL/PLATELET
Basophils Absolute: 0 10*3/uL (ref 0.0–0.2)
Basos: 0 %
EOS (ABSOLUTE): 0.1 10*3/uL (ref 0.0–0.4)
Eos: 1 %
Hematocrit: 42.7 % (ref 37.5–51.0)
Hemoglobin: 13.4 g/dL (ref 13.0–17.7)
Immature Grans (Abs): 0 10*3/uL (ref 0.0–0.1)
Immature Granulocytes: 0 %
Lymphocytes Absolute: 1.8 10*3/uL (ref 0.7–3.1)
Lymphs: 23 %
MCH: 25.7 pg — ABNORMAL LOW (ref 26.6–33.0)
MCHC: 31.4 g/dL — ABNORMAL LOW (ref 31.5–35.7)
MCV: 82 fL (ref 79–97)
Monocytes Absolute: 0.9 10*3/uL (ref 0.1–0.9)
Monocytes: 11 %
Neutrophils Absolute: 5 10*3/uL (ref 1.4–7.0)
Neutrophils: 65 %
Platelets: 366 10*3/uL (ref 150–450)
RBC: 5.21 x10E6/uL (ref 4.14–5.80)
RDW: 14.6 % (ref 11.6–15.4)
WBC: 7.8 10*3/uL (ref 3.4–10.8)

## 2023-12-29 LAB — BASIC METABOLIC PANEL WITH GFR
BUN/Creatinine Ratio: 14 (ref 9–20)
BUN: 36 mg/dL — ABNORMAL HIGH (ref 6–24)
CO2: 22 mmol/L (ref 20–29)
Calcium: 8.8 mg/dL (ref 8.7–10.2)
Chloride: 99 mmol/L (ref 96–106)
Creatinine, Ser: 2.58 mg/dL — ABNORMAL HIGH (ref 0.76–1.27)
Glucose: 151 mg/dL — ABNORMAL HIGH (ref 70–99)
Potassium: 3.5 mmol/L (ref 3.5–5.2)
Sodium: 141 mmol/L (ref 134–144)
eGFR: 28 mL/min/{1.73_m2} — ABNORMAL LOW (ref >59)

## 2023-12-29 LAB — LIPID PANEL
Chol/HDL Ratio: 3.2 ratio (ref 0.0–5.0)
Cholesterol, Total: 126 mg/dL (ref 100–199)
HDL: 40 mg/dL (ref >39)
LDL Chol Calc (NIH): 72 mg/dL (ref 0–99)
Triglycerides: 65 mg/dL (ref 0–149)
VLDL Cholesterol Cal: 14 mg/dL (ref 5–40)

## 2023-12-29 LAB — HEMOGLOBIN A1C
Est. average glucose Bld gHb Est-mCnc: 140 mg/dL
Hgb A1c MFr Bld: 6.5 % — ABNORMAL HIGH (ref 4.8–5.6)

## 2023-12-31 ENCOUNTER — Ambulatory Visit: Payer: No Typology Code available for payment source

## 2023-12-31 ENCOUNTER — Ambulatory Visit: Payer: Self-pay | Admitting: Internal Medicine

## 2023-12-31 VITALS — BP 129/65 | Ht 72.0 in | Wt 307.0 lb

## 2023-12-31 DIAGNOSIS — E119 Type 2 diabetes mellitus without complications: Secondary | ICD-10-CM

## 2023-12-31 DIAGNOSIS — Z0000 Encounter for general adult medical examination without abnormal findings: Secondary | ICD-10-CM | POA: Diagnosis not present

## 2023-12-31 DIAGNOSIS — Z5982 Transportation insecurity: Secondary | ICD-10-CM | POA: Diagnosis not present

## 2023-12-31 DIAGNOSIS — I10 Essential (primary) hypertension: Secondary | ICD-10-CM

## 2023-12-31 DIAGNOSIS — N1832 Chronic kidney disease, stage 3b: Secondary | ICD-10-CM

## 2023-12-31 DIAGNOSIS — I639 Cerebral infarction, unspecified: Secondary | ICD-10-CM

## 2023-12-31 DIAGNOSIS — E1122 Type 2 diabetes mellitus with diabetic chronic kidney disease: Secondary | ICD-10-CM

## 2023-12-31 NOTE — Progress Notes (Signed)
 Subjective:   Brandon French is a 58 y.o. who presents for a Medicare Wellness preventive visit.  As a reminder, Annual Wellness Visits don't include a physical exam, and some assessments may be limited, especially if this visit is performed virtually. We may recommend an in-person follow-up visit with your provider if needed.  Visit Complete: Virtual I connected with  Brandon French on 12/31/23 by a audio enabled telemedicine application and verified that I am speaking with the correct person using two identifiers.  Patient Location: Home  Provider Location: Office/Clinic  I discussed the limitations of evaluation and management by telemedicine. The patient expressed understanding and agreed to proceed.  Vital Signs: Because this visit was a virtual/telehealth visit, some criteria may be missing or patient reported. Any vitals not documented were not able to be obtained and vitals that have been documented are patient reported.  VideoDeclined- This patient declined Librarian, academic. Therefore the visit was completed with audio only.  Persons Participating in Visit: Patient.  AWV Questionnaire: No: Patient Medicare AWV questionnaire was not completed prior to this visit.  Cardiac Risk Factors include: advanced age (>75men, >62 women);diabetes mellitus;male gender;obesity (BMI >30kg/m2);sedentary lifestyle     Objective:     Today's Vitals   12/31/23 1332  BP: 129/65  Weight: (!) 307 lb (139.3 kg)  Height: 6' (1.829 m)   Body mass index is 41.64 kg/m.     12/31/2023    1:45 PM 12/28/2022    1:08 PM 09/13/2021    2:48 PM 09/01/2021    8:13 AM 05/20/2021    3:15 PM 05/17/2021    1:00 PM  Advanced Directives  Does Patient Have a Medical Advance Directive? No No No No No No  Would patient like information on creating a medical advance directive? No - Patient declined No - Patient declined  Yes (ED - Information included in AVS) No - Patient  declined No - Patient declined    Current Medications (verified) Outpatient Encounter Medications as of 12/31/2023  Medication Sig   atorvastatin  (LIPITOR) 80 MG tablet Take 1 tablet (80 mg total) by mouth daily.   chlorthalidone  (HYGROTON ) 25 MG tablet Take 1 tablet (25 mg total) by mouth daily.   cloNIDine  (CATAPRES ) 0.3 MG tablet Take 1 tablet (0.3 mg total) by mouth 2 (two) times daily.   clopidogrel  (PLAVIX ) 75 MG tablet TAKE 1 TABLET BY MOUTH EVERY DAY   dapagliflozin  propanediol (FARXIGA ) 10 MG TABS tablet Take 1 tablet (10 mg total) by mouth daily before breakfast.   Evolocumab  (REPATHA  SURECLICK) 140 MG/ML SOAJ Inject 140 mg into the skin every 14 (fourteen) days.   ezetimibe  (ZETIA ) 10 MG tablet Take 1 tablet (10 mg total) by mouth daily.   ferrous sulfate  325 (65 FE) MG tablet Take 1 tablet (325 mg total) by mouth daily.   hydrALAZINE  (APRESOLINE ) 100 MG tablet TAKE 1 TABLET BY MOUTH 3 TIMES DAILY.   metoprolol  succinate (TOPROL -XL) 50 MG 24 hr tablet Take 1 tablet (50 mg total) by mouth daily. Take with or immediately following a meal.   NIFEdipine  (PROCARDIA  XL) 60 MG 24 hr tablet Take 1 tablet (60 mg total) by mouth daily.   No facility-administered encounter medications on file as of 12/31/2023.    Allergies (verified) Aleve [naproxen]   History: Past Medical History:  Diagnosis Date   Chronic kidney disease    Diabetes mellitus (HCC)    Elevated cholesterol    High blood pressure  Past Surgical History:  Procedure Laterality Date   TRANSURETHRAL RESECTION OF PROSTATE  2020   Family History  Problem Relation Age of Onset   Stroke Mother    Diabetes Mother    Cancer Mother    Diabetes Father    Kidney failure Father    Colon cancer Half-Brother    Lung cancer Half-Brother    Social History   Socioeconomic History   Marital status: Divorced    Spouse name: Not on file   Number of children: 2   Years of education: 11   Highest education level: Not on  file  Occupational History   Not on file  Tobacco Use   Smoking status: Former    Current packs/day: 0.00    Average packs/day: 0.5 packs/day for 8.0 years (4.0 ttl pk-yrs)    Types: Cigarettes    Start date: 2003    Quit date: 2011    Years since quitting: 14.4   Smokeless tobacco: Never   Tobacco comments:    Pt stated that he smoked for about 8 years , he quit 2011.  Vaping Use   Vaping status: Never Used  Substance and Sexual Activity   Alcohol use: Never   Drug use: Never   Sexual activity: Not Currently  Other Topics Concern   Not on file  Social History Narrative   Pt lives with his friends.    Social Drivers of Corporate investment banker Strain: Low Risk  (12/31/2023)   Overall Financial Resource Strain (CARDIA)    Difficulty of Paying Living Expenses: Not hard at all  Food Insecurity: No Food Insecurity (12/31/2023)   Hunger Vital Sign    Worried About Running Out of Food in the Last Year: Never true    Ran Out of Food in the Last Year: Never true  Transportation Needs: Unmet Transportation Needs (12/31/2023)   PRAPARE - Administrator, Civil Service (Medical): Yes    Lack of Transportation (Non-Medical): No  Physical Activity: Sufficiently Active (12/31/2023)   Exercise Vital Sign    Days of Exercise per Week: 7 days    Minutes of Exercise per Session: 30 min  Stress: No Stress Concern Present (12/31/2023)   [employer] of Occupational Health - Occupational Stress Questionnaire    Feeling of Stress : Not at all  Social Connections: Moderately Isolated (12/31/2023)   Social Connection and Isolation Panel [NHANES]    Frequency of Communication with Friends and Family: More than three times a week    Frequency of Social Gatherings with Friends and Family: Never    Attends Religious Services: Never    Database administrator or Organizations: No    Attends Engineer, structural: More than 4 times per year    Marital Status: Divorced    Tobacco  Counseling Counseling given: Yes Tobacco comments: Pt stated that he smoked for about 8 years , he quit 2011.    Clinical Intake:  Pre-visit preparation completed: Yes  Pain : No/denies pain     BMI - recorded: 41.64 Nutritional Status: BMI > 30  Obese Nutritional Risks: None Diabetes: Yes CBG done?: No (telehealth visit. unable to obtain cbg) Did pt. bring in CBG monitor from home?: No  Lab Results  Component Value Date   HGBA1C 6.5 (H) 12/28/2023   HGBA1C 5.3 05/03/2023   HGBA1C 5.7 (H) 03/16/2022     How often do you need to have someone help you when you read instructions, pamphlets,  or other written materials from your doctor or pharmacy?: 1 - Never  Interpreter Needed?: No  Information entered by :: Sally Crazier CMA   Activities of Daily Living     12/31/2023    1:35 PM  In your present state of health, do you have any difficulty performing the following activities:  Hearing? 0  Vision? 0  Difficulty concentrating or making decisions? 0  Walking or climbing stairs? 0  Dressing or bathing? 0  Doing errands, shopping? 0  Preparing Food and eating ? N  Using the Toilet? N  In the past six months, have you accidently leaked urine? N  Do you have problems with loss of bowel control? N  Managing your Medications? N  Managing your Finances? N  Housekeeping or managing your Housekeeping? N    Patient Care Team: Alison Irvine, FNP as PCP - General (Family Medicine) Jann Melody, MD as PCP - Cardiology (Cardiology)  I have updated your Care Teams any recent Medical Services you may have received from other providers in the past year.     Assessment:    This is a routine wellness examination for Brandon French.  Hearing/Vision screen Hearing Screening - Comments:: Patient denies any hearing difficulties.   Vision Screening - Comments:: Wears rx glasses -diabetic retinal screening scheduled to be completed in office scheduled for patient today    Goals  Addressed             This Visit's Progress    Patient Stated   On track    Get his own apartment       Depression Screen     12/31/2023    1:42 PM 12/28/2023   10:02 AM 08/07/2023    1:03 PM 05/03/2023    3:23 PM 12/28/2022    1:09 PM 12/28/2022    1:08 PM 12/05/2022    2:44 PM  PHQ 2/9 Scores  PHQ - 2 Score 0 0 0 0 0 0 0  PHQ- 9 Score 0 0   0 0 0    Fall Risk     12/31/2023    1:46 PM 12/28/2023   10:02 AM 08/07/2023    1:03 PM 05/03/2023    3:23 PM 12/28/2022    1:09 PM  Fall Risk   Falls in the past year? 0 0 0 1 0  Number falls in past yr: 0 0 0 0 0  Injury with Fall? 0 0 0 0 0  Risk for fall due to : No Fall Risks No Fall Risks No Fall Risks  No Fall Risks  Follow up Falls evaluation completed Falls evaluation completed Falls evaluation completed  Falls evaluation completed    MEDICARE RISK AT HOME:  Medicare Risk at Home Any stairs in or around the home?: No If so, are there any without handrails?: No Home free of loose throw rugs in walkways, pet beds, electrical cords, etc?: Yes Adequate lighting in your home to reduce risk of falls?: Yes Life alert?: No Use of a cane, walker or w/c?: No Grab bars in the bathroom?: Yes Shower chair or bench in shower?: Yes Elevated toilet seat or a handicapped toilet?: Yes  TIMED UP AND GO:  Was the test performed?  No  Cognitive Function: 6CIT completed    12/28/2022    1:09 PM  MMSE - Mini Mental State Exam  Not completed: Unable to complete        12/31/2023    1:47 PM 12/28/2022  1:10 PM 09/01/2021    8:16 AM  6CIT Screen  What Year? 0 points 0 points 0 points  What month? 0 points 0 points 0 points  What time? 0 points 0 points 0 points  Count back from 20 0 points 0 points 0 points  Months in reverse 0 points 0 points 0 points  Repeat phrase 0 points 0 points 0 points  Total Score 0 points 0 points 0 points    Immunizations Immunization History  Administered Date(s) Administered   Influenza,inj,Quad PF,6+  Mos 05/19/2021, 04/18/2022    Screening Tests Health Maintenance  Topic Date Due   DTaP/Tdap/Td (1 - Tdap) Never done   Pneumococcal Vaccine 20-29 Years old (1 of 2 - PCV) Never done   Zoster Vaccines- Shingrix (1 of 2) Never done   OPHTHALMOLOGY EXAM  10/26/2022   Diabetic kidney evaluation - Urine ACR  03/17/2023   FOOT EXAM  03/29/2023   INFLUENZA VACCINE  02/29/2024   HEMOGLOBIN A1C  06/29/2024   Fecal DNA (Cologuard)  10/06/2024   Diabetic kidney evaluation - eGFR measurement  12/27/2024   Medicare Annual Wellness (AWV)  12/30/2024   Hepatitis C Screening  Completed   HIV Screening  Completed   HPV VACCINES  Aged Out   Meningococcal B Vaccine  Aged Out   Colonoscopy  Discontinued   COVID-19 Vaccine  Discontinued    Health Maintenance  Health Maintenance Due  Topic Date Due   DTaP/Tdap/Td (1 - Tdap) Never done   Pneumococcal Vaccine 7-54 Years old (1 of 2 - PCV) Never done   Zoster Vaccines- Shingrix (1 of 2) Never done   OPHTHALMOLOGY EXAM  10/26/2022   Diabetic kidney evaluation - Urine ACR  03/17/2023   FOOT EXAM  03/29/2023   Health Maintenance Items Addressed: Diabetic Foot Exam recommended, Labs Ordered: UACR, Retinal Screen Scheduled. Podiatry Referral Placed. Discussed with patient the importance of routine eye exams, foot exams, and labs with verbal understanding  Additional Screening:  Vision Screening: Recommended annual ophthalmology exams for early detection of glaucoma and other disorders of the eye. Would you like a referral to an eye doctor? No   Diabetic Retinal Screening scheduled for in office Dental Screening: Recommended annual dental exams for proper oral hygiene  Community Resource Referral / Chronic Care Management: CRR required this visit?  Yes   CCM required this visit?  No   Plan:    I have personally reviewed and noted the following in the patient's chart:   Medical and social history Use of alcohol, tobacco or illicit drugs   Current medications and supplements including opioid prescriptions. Patient is not currently taking opioid prescriptions. Functional ability and status Nutritional status Physical activity Advanced directives List of other physicians Hospitalizations, surgeries, and ER visits in previous 12 months Vitals Screenings to include cognitive, depression, and falls Referrals and appointments  In addition, I have reviewed and discussed with patient certain preventive protocols, quality metrics, and best practice recommendations. A written personalized care plan for preventive services as well as general preventive health recommendations were provided to patient.   Tazia Illescas, CMA   12/31/2023   After Visit Summary: (Mail) Due to this being a telephonic visit, the after visit summary with patients personalized plan was offered to patient via mail   Notes: Please refer to Routing Comments.a

## 2023-12-31 NOTE — Patient Instructions (Signed)
 [name] , Thank you for taking time out of your busy schedule to complete your Annual Wellness Visit with me. I enjoyed our conversation and look forward to speaking with you again next year. I, as well as your care team,  appreciate your ongoing commitment to your health goals. Please review the following plan we discussed and let me know if I can assist you in the future. Your Game plan/ To Do List    Referrals: If you haven't heard from the office you've been referred to, please reach out to them at the phone number provided.  You've been referred to a foot doctor to have a diabetic foot exam. Their information is below if you don't receive a phone call from their office.  Contact Information [facility] ([facility]) [address], [place] [address] Main Phone:[phone]  A referral has been placed for you to check and see what additional resources are available to you.   If you haven't heard from anyone within the next 7 business days, please call them and let them know a referral has been placed for you Phone: [phone]  Follow up Visits: Next Medicare AWV with our clinical staff:   December 31, 2024 at 10:00 telephone visit Have you seen your provider in the last 6 months (3 months if uncontrolled diabetes)? Yes Next Office Visit with your provider:   July 04, 2024 at 10:20 am with Brandon French Diabetic Retinal Screening: January 16, 2024 at 10:00 at [facility]  Clinician Recommendations:   Aim for 30 minutes of exercise or brisk walking, 6-8 glasses of water, and 5 servings of fruits and vegetables each day.  I enjoyed our conversation today and look forward to talking with you again next year!! Have a wonderful and safe year. All the best, Brandon French This is a list of the screening recommended for you and due dates:  Health Maintenance  Topic Date Due   DTaP/Tdap/Td vaccine (1 - Tdap) Never done   Pneumococcal Vaccination (1 of 2 - PCV) Never  done   Zoster (Shingles) Vaccine (1 of 2) Never done   Eye exam for diabetics  10/26/2022   Yearly kidney health urinalysis for diabetes  03/17/2023   Complete foot exam   03/29/2023   Flu Shot  02/29/2024   Hemoglobin A1C  06/29/2024   Cologuard (Stool DNA test)  10/06/2024   Yearly kidney function blood test for diabetes  12/27/2024   Medicare Annual Wellness Visit  12/30/2024   Hepatitis C Screening  Completed   HIV Screening  Completed   HPV Vaccine  Aged Out   Meningitis B Vaccine  Aged Out   Colon Cancer Screening  Discontinued   COVID-19 Vaccine  Discontinued    Advanced directives: (Declined) Advance directive discussed with you today. Even though you declined this today, please call our office should you change your mind, and we can give you the proper paperwork for you to fill out. Advance Care Planning is important because it:  [x]  Makes sure you receive the medical care that is consistent with your values, goals, and preferences  [x]  It provides guidance to your family and loved ones and reduces their decisional burden about whether or not they are making the right decisions based on your wishes.  Follow the link provided in your after visit summary or read over the paperwork we have mailed to you to help you started getting your Advance Directives in place. If you need assistance  in completing these, please reach out to us  so that we can help you! See attachments for Preventive Care and Fall Prevention Tips.  Understanding Your Risk for Falls Millions of people have serious injuries from falls each year. It is important to understand your risk of falling. Talk with your health care provider about your risk and what you can do to lower it. If you do have a serious fall, make sure to tell your provider. Falling once raises your risk of falling again. How can falls affect me? Serious injuries from falls are common. These include: Broken bones, such as hip fractures. Head  injuries, such as traumatic brain injuries (TBI) or concussions. A fear of falling can cause you to avoid activities and stay at home. This can make your muscles weaker and raise your risk for a fall. What can increase my risk? There are a number of risk factors that increase your risk for falling. The more risk factors you have, the higher your risk of falling. Serious injuries from a fall happen most often to people who are older than 57 years old. Teenagers and young adults ages 50-29 are also at higher risk. Common risk factors include: Weakness in the lower body. Being generally weak or confused due to long-term (chronic) illness. Dizziness or balance problems. Poor vision. Medicines that cause dizziness or drowsiness. These may include: Medicines for your blood pressure, heart, anxiety, insomnia, or swelling (edema). Pain medicines. Muscle relaxants. Other risk factors include: Drinking alcohol. Having had a fall in the past. Having foot pain or wearing improper footwear. Working at a dangerous job. Having any of the following in your home: Tripping hazards, such as floor clutter or loose rugs. Poor lighting. Pets. Having dementia or memory loss. What actions can I take to lower my risk of falling?     Physical activity Stay physically fit. Do strength and balance exercises. Consider taking a regular class to build strength and balance. Yoga and tai chi are good options. Vision Have your eyes checked every year and your prescription for glasses or contacts updated as needed. Shoes and walking aids Wear non-skid shoes. Wear shoes that have rubber soles and low heels. Do not wear high heels. Do not walk around the house in socks or slippers. Use a cane or walker as told by your provider. Home safety Attach secure railings on both sides of your stairs. Install grab bars for your bathtub, shower, and toilet. Use a non-skid mat in your bathtub or shower. Attach bath mats  securely with double-sided, non-slip rug tape. Use good lighting in all rooms. Keep a flashlight near your bed. Make sure there is a clear path from your bed to the bathroom. Use night-lights. Do not use throw rugs. Make sure all carpeting is taped or tacked down securely. Remove all clutter from walkways and stairways, including extension cords. Repair uneven or broken steps and floors. Avoid walking on icy or slippery surfaces. Walk on the grass instead of on icy or slick sidewalks. Use ice melter to get rid of ice on walkways in the winter. Use a cordless phone. Questions to ask your health care provider Can you help me check my risk for a fall? Do any of my medicines make me more likely to fall? Should I take a vitamin D  supplement? What exercises can I do to improve my strength and balance? Should I make an appointment to have my vision checked? Do I need a bone density test to check for weak  bones (osteoporosis)? Would it help to use a cane or a walker? Where to find more information Centers for Disease Control and Prevention, STEADI: TonerPromos.no Community-Based Fall Prevention Programs: TonerPromos.no [employer] on Aging: BaseRingTones.pl Contact a health care provider if: You fall at home. You are afraid of falling at home. You feel weak, drowsy, or dizzy. This information is not intended to replace advice given to you by your health care provider. Make sure you discuss any questions you have with your health care provider. Document Revised: 03/20/2022 Document Reviewed: 03/20/2022 Elsevier Patient Education  2024 [employer].  Understanding Your Risk for Falls Millions of people have serious injuries from falls each year. It is important to understand your risk of falling. Talk with your health care provider about your risk and what you can do to lower it. If you do have a serious fall, make sure to tell your provider. Falling once raises your risk of falling again. How can falls  affect me? Serious injuries from falls are common. These include: Broken bones, such as hip fractures. Head injuries, such as traumatic brain injuries (TBI) or concussions. A fear of falling can cause you to avoid activities and stay at home. This can make your muscles weaker and raise your risk for a fall. What can increase my risk? There are a number of risk factors that increase your risk for falling. The more risk factors you have, the higher your risk of falling. Serious injuries from a fall happen most often to people who are older than 57 years old. Teenagers and young adults ages 74-29 are also at higher risk. Common risk factors include: Weakness in the lower body. Being generally weak or confused due to long-term (chronic) illness. Dizziness or balance problems. Poor vision. Medicines that cause dizziness or drowsiness. These may include: Medicines for your blood pressure, heart, anxiety, insomnia, or swelling (edema). Pain medicines. Muscle relaxants. Other risk factors include: Drinking alcohol. Having had a fall in the past. Having foot pain or wearing improper footwear. Working at a dangerous job. Having any of the following in your home: Tripping hazards, such as floor clutter or loose rugs. Poor lighting. Pets. Having dementia or memory loss. What actions can I take to lower my risk of falling?     Physical activity Stay physically fit. Do strength and balance exercises. Consider taking a regular class to build strength and balance. Yoga and tai chi are good options. Vision Have your eyes checked every year and your prescription for glasses or contacts updated as needed. Shoes and walking aids Wear non-skid shoes. Wear shoes that have rubber soles and low heels. Do not wear high heels. Do not walk around the house in socks or slippers. Use a cane or walker as told by your provider. Home safety Attach secure railings on both sides of your stairs. Install grab  bars for your bathtub, shower, and toilet. Use a non-skid mat in your bathtub or shower. Attach bath mats securely with double-sided, non-slip rug tape. Use good lighting in all rooms. Keep a flashlight near your bed. Make sure there is a clear path from your bed to the bathroom. Use night-lights. Do not use throw rugs. Make sure all carpeting is taped or tacked down securely. Remove all clutter from walkways and stairways, including extension cords. Repair uneven or broken steps and floors. Avoid walking on icy or slippery surfaces. Walk on the grass instead of on icy or slick sidewalks. Use ice melter to get rid of  ice on walkways in the winter. Use a cordless phone. Questions to ask your health care provider Can you help me check my risk for a fall? Do any of my medicines make me more likely to fall? Should I take a vitamin D  supplement? What exercises can I do to improve my strength and balance? Should I make an appointment to have my vision checked? Do I need a bone density test to check for weak bones (osteoporosis)? Would it help to use a cane or a walker? Where to find more information Centers for Disease Control and Prevention, STEADI: TonerPromos.no Community-Based Fall Prevention Programs: TonerPromos.no [employer] on Aging: BaseRingTones.pl Contact a health care provider if: You fall at home. You are afraid of falling at home. You feel weak, drowsy, or dizzy. This information is not intended to replace advice given to you by your health care provider. Make sure you discuss any questions you have with your health care provider. Document Revised: 03/20/2022 Document Reviewed: 03/20/2022 Elsevier Patient Education  2024 [employer].

## 2024-01-01 ENCOUNTER — Telehealth: Payer: Self-pay

## 2024-01-01 NOTE — Progress Notes (Signed)
 Complex Care Management Note  Care Guide Note 01/01/2024 Name: Brandon French MRN: [id number] DOB: [birth year]  Brandon French is a 58 y.o. year old male who sees Alison Irvine, FNP for primary care. I reached out to Laveda Ports by phone today to offer complex care management services.  Mr. Allegretto was given information about Complex Care Management services today including:   The Complex Care Management services include support from the care team which includes your Nurse Care Manager, Clinical Social Worker, or Pharmacist.  The Complex Care Management team is here to help remove barriers to the health concerns and goals most important to you. Complex Care Management services are voluntary, and the patient may decline or stop services at any time by request to their care team member.   Complex Care Management Consent Status: Patient agreed to services and verbal consent obtained.   Follow up plan:  Telephone appointment with complex care management team member scheduled for:  01/25/2024  Encounter Outcome:  Patient Scheduled  Lenton Rail , RMA     [place]  [facility], [facility] Guide  Direct Dial: [phone]  Website: [email or web address]

## 2024-01-02 ENCOUNTER — Telehealth: Payer: Self-pay

## 2024-01-02 NOTE — Progress Notes (Signed)
 Complex Care Management Note Care Guide Note  01/02/2024 Name: [name] MRN: [id number] DOB: [birth year]  Brandon French is a 58 y.o. year old male who is a primary care patient of Alison Irvine, [place] . The community resource team was consulted for assistance with Transportation Needs   SDOH screenings and interventions completed:  Yes  Social Drivers of Health From This Encounter   Food Insecurity: No Food Insecurity (01/02/2024)   Hunger Vital Sign    Worried About Running Out of Food in the Last Year: Never true    Ran Out of Food in the Last Year: Never true  Housing: Low Risk  (01/02/2024)   Housing Stability Vital Sign    Unable to Pay for Housing in the Last Year: No    Number of Times Moved in the Last Year: 0    Homeless in the Last Year: No  Financial Resource Strain: Low Risk  (01/02/2024)   Overall Financial Resource Strain (CARDIA)    Difficulty of Paying Living Expenses: Not hard at all  Transportation Needs: No Transportation Needs (01/02/2024)   PRAPARE - Administrator, Civil Service (Medical): No    Lack of Transportation (Non-Medical): No  Utilities: Not At Risk (01/02/2024)   Utilities    Threatened with loss of utilities: No    SDOH Interventions Today    Flowsheet Row Most Recent Value  SDOH Interventions   Transportation Interventions Other (Comment)  [Patient stated he uses [facility] to get to his appointments. He does not neet assistance at this time.]        Care guide performed the following interventions: Patient provided with information about care guide support team and interviewed to confirm resource needs.  Follow Up Plan:  No further follow up planned at this time. The patient has been provided with needed resources.  Encounter Outcome:  Patient Visit Completed  Mateen Franssen [facility]  [facility] Guide Direct Dial: [phone]  Fax: [phone] Website: [email or web address]

## 2024-01-04 DIAGNOSIS — R809 Proteinuria, unspecified: Secondary | ICD-10-CM | POA: Diagnosis not present

## 2024-01-04 DIAGNOSIS — N189 Chronic kidney disease, unspecified: Secondary | ICD-10-CM | POA: Diagnosis not present

## 2024-01-04 DIAGNOSIS — D631 Anemia in chronic kidney disease: Secondary | ICD-10-CM | POA: Diagnosis not present

## 2024-01-07 NOTE — Progress Notes (Signed)
 This visit was performed by a medical professional under my direct supervision. I was immediately available for consultation/collaboration. I have reviewed and agree with the Annual Wellness Visit documentation.

## 2024-01-11 DIAGNOSIS — R809 Proteinuria, unspecified: Secondary | ICD-10-CM | POA: Diagnosis not present

## 2024-01-11 DIAGNOSIS — E1129 Type 2 diabetes mellitus with other diabetic kidney complication: Secondary | ICD-10-CM | POA: Diagnosis not present

## 2024-01-11 DIAGNOSIS — E1122 Type 2 diabetes mellitus with diabetic chronic kidney disease: Secondary | ICD-10-CM | POA: Diagnosis not present

## 2024-01-11 DIAGNOSIS — I129 Hypertensive chronic kidney disease with stage 1 through stage 4 chronic kidney disease, or unspecified chronic kidney disease: Secondary | ICD-10-CM | POA: Diagnosis not present

## 2024-01-16 ENCOUNTER — Ambulatory Visit

## 2024-01-22 ENCOUNTER — Ambulatory Visit: Payer: Self-pay

## 2024-01-25 ENCOUNTER — Other Ambulatory Visit: Payer: Self-pay | Admitting: *Deleted

## 2024-01-25 ENCOUNTER — Encounter: Payer: Self-pay | Admitting: *Deleted

## 2024-01-25 NOTE — Patient Outreach (Signed)
 Complex Care Management   Visit Note  01/25/2024  Name:  Brandon French MRN: [id number] DOB: April 15, 1966  Situation: Referral received for Complex Care Management related to Diabetes with Complications I obtained verbal consent from Patient.  Visit completed with patient  on the phone  Background:   Past Medical History:  Diagnosis Date   Chronic kidney disease    Diabetes mellitus (HCC)    Elevated cholesterol    High blood pressure     Assessment: Patient Reported Symptoms:  Cognitive Cognitive Status: Able to follow simple commands, Alert and oriented to person, place, and time Cognitive/Intellectual Conditions Management [RPT]: None reported or documented in medical history or problem list   Health Maintenance Behaviors: Annual physical exam  Neurological Neurological Review of Symptoms: No symptoms reported Neurological Conditions: Stroke, hemorrhagic Neurological Management Strategies: Medication therapy, Routine screening  HEENT HEENT Symptoms Reported: No symptoms reported      Cardiovascular Cardiovascular Symptoms Reported: No symptoms reported Cardiovascular Conditions: High blood cholesterol, Hypertension  Respiratory Respiratory Symptoms Reported: No symptoms reported    Endocrine Patient reports the following symptoms related to hypoglycemia or hyperglycemia : No symptoms reported Is patient diabetic?: Yes Is patient checking blood sugars at home?: No Endocrine Conditions: Diabetes  Gastrointestinal Gastrointestinal Symptoms Reported: No symptoms reported   Nutrition Risk Screen (CP): No indicators present  Genitourinary Genitourinary Symptoms Reported: No symptoms reported Genitourinary Conditions: Chronic kidney disease  Integumentary Integumentary Symptoms Reported: No symptoms reported    Musculoskeletal Musculoskelatal Symptoms Reviewed: No symptoms reported   Falls in the past year?: No Number of falls in past year: 1 or less Was there an injury  with Fall?: No Fall Risk Category Calculator: 0 Patient Fall Risk Level: Low Fall Risk Patient at Risk for Falls Due to: No Fall Risks Fall risk Follow up: Falls evaluation completed  Psychosocial       Quality of Family Relationships: helpful, involved, supportive Do you feel physically threatened by others?: No      01/25/2024    2:44 PM  Depression screen PHQ 2/9  Decreased Interest 0  Down, Depressed, Hopeless 0  PHQ - 2 Score 0    There were no vitals filed for this visit.  Medications Reviewed Today     Reviewed by Bertrum Rosina HERO, RN (Registered Nurse) on 01/25/24 at 1442  Med List Status: <None>   Medication Order Taking? Sig Documenting Provider Last Dose Status Informant  atorvastatin  (LIPITOR) 80 MG tablet [id number] Yes Take 1 tablet (80 mg total) by mouth daily. Melvenia Manus BRAVO, MD  Active   chlorthalidone  (HYGROTON ) 25 MG tablet [id number] Yes Take 1 tablet (25 mg total) by mouth daily. Melvenia Manus BRAVO, MD  Active   cloNIDine  (CATAPRES ) 0.3 MG tablet [id number] Yes Take 1 tablet (0.3 mg total) by mouth 2 (two) times daily.  Patient taking differently: Take 0.3 mg by mouth daily.   Melvenia Manus BRAVO, MD  Active   clopidogrel  (PLAVIX ) 75 MG tablet [id number] Yes TAKE 1 TABLET BY MOUTH EVERY DAY Melvenia Manus BRAVO, MD  Active   dapagliflozin  propanediol (FARXIGA ) 10 MG TABS tablet [id number] Yes Take 1 tablet (10 mg total) by mouth daily before breakfast. Melvenia Manus BRAVO, MD  Active   Evolocumab  (REPATHA  SURECLICK) 140 MG/ML SOAJ [id number]  Inject 140 mg into the skin every 14 (fourteen) days. Melvenia Manus BRAVO, MD  Active   ezetimibe  (ZETIA ) 10 MG tablet [id number] Yes Take 1 tablet (10 mg total) by mouth daily. Melvenia Manus BRAVO,  MD  Active   ferrous sulfate  325 (65 FE) MG tablet [id number] Yes Take 1 tablet (325 mg total) by mouth daily. Melvenia Manus BRAVO, MD  Active   hydrALAZINE  (APRESOLINE ) 100 MG tablet [id number] Yes TAKE 1 TABLET BY MOUTH 3 TIMES DAILY. Melvenia Manus BRAVO, MD  Active   metoprolol  succinate (TOPROL -XL) 50 MG 24 hr tablet [id number] Yes Take 1 tablet (50 mg total) by mouth daily. Take with or immediately following a meal. Melvenia Manus BRAVO, MD  Active   NIFEdipine  (PROCARDIA  XL) 60 MG 24 hr tablet [id number] Yes Take 1 tablet (60 mg total) by mouth daily. Melvenia Manus BRAVO, MD  Active             Recommendation:   CBG Meter  Follow Up Plan:   Telephone follow-up within 2 weeks  Rosina Forte, BSN RN [facility], [facility] Health RN Care Manager Direct Dial: [phone]  Fax: [phone]

## 2024-01-25 NOTE — Patient Instructions (Signed)
 Visit Information  Thank you for taking time to visit with me today. Please don't hesitate to contact me if I can be of assistance to you before our next scheduled appointment.  Our next appointment is by telephone on 02-06-2024 at 2:00 pm Please call the care guide team at [phone] if you need to cancel or reschedule your appointment.   Following is a copy of your care plan:   Goals Addressed             This Visit's Progress    VBCI RN Care Plan: DM       Problems:  Chronic Disease Management support and education needs related to DMII  Goal: Over the next 90 days the Patient will attend all scheduled medical appointments: with primary care provider and specialist as evidenced by keeping all scheduled appointments        continue to work with RN Care Manager and/or Social Worker to address care management and care coordination needs related to DMII as evidenced by adherence to care management team scheduled appointments     take all medications exactly as prescribed and will call provider for medication related questions as evidenced by compliance with all medications    verbalize basic understanding of DMII disease process and self health management plan as evidenced by verbal explanation, recognizing/monitoring symptoms, lifestyle modifications  Interventions:   Diabetes Interventions: Assessed patient's understanding of A1c goal: <6.5% Provided education to patient about basic DM disease process Reviewed medications with patient and discussed importance of medication adherence Counseled on importance of regular laboratory monitoring as prescribed Discussed plans with patient for ongoing care management follow up and provided patient with direct contact information for care management team Provided patient with written educational materials related to hypo and hyperglycemia and importance of correct treatment Reviewed scheduled/upcoming provider appointments including:  07-04-2024 with PCP Advised patient, providing education and rationale, to check cbg at minimum daily, fasting and record, calling provider for findings outside established parameters Review of patient status, including review of consultants reports, relevant laboratory and other test results, and medications completed Screening for signs and symptoms of depression related to chronic disease state  Assessed social determinant of health barriers Lab Results  Component Value Date   HGBA1C 6.5 (H) 12/28/2023    Patient Self-Care Activities:  Attend all scheduled provider appointments Attend church or other social activities Call pharmacy for medication refills 3-7 days in advance of running out of medications Call provider office for new concerns or questions  Perform all self care activities independently  Perform IADL's (shopping, preparing meals, housekeeping, managing finances) independently Take medications as prescribed   schedule appointment with eye doctor check blood sugar at prescribed times: before meals and at bedtime and when you have symptoms of low or high blood sugar check feet daily for cuts, sores or redness drink 6 to 8 glasses of water each day fill half of plate with vegetables join a weight loss program limit fast food meals to no more than 1 per week manage portion size prepare main meal at home 3 to 5 days each week set a realistic goal switch to low-fat or skim milk switch to sugar-free drinks keep feet up while sitting wash and dry feet carefully every day wear comfortable, cotton socks wear comfortable, well-fitting shoes  Plan:  Telephone follow up appointment with care management team member scheduled for:  02-06-2024 at 2:00 pm             Please call the  Suicide and Crisis Lifeline: 988 call the [place]  National Suicide Prevention Lifeline: 708-745-4536 or TTY: (772)145-5832 TTY 570-267-0169) to talk to a trained counselor call 1-800-273-TALK  (toll free, 24 hour hotline) call the [facility]: 260-171-8156 call 911 if you are experiencing a Mental Health or Behavioral Health Crisis or need someone to talk to.  Patient verbalizes understanding of instructions and care plan provided today and agrees to view in MyChart. Active MyChart status and patient understanding of how to access instructions and care plan via MyChart confirmed with patient.     Rosina Forte, BSN RN [facility], [facility] Health RN Care Manager Direct Dial: [phone]  Fax: [phone]   Carbohydrate Counting for Diabetes Mellitus, Adult Carbohydrate counting is a method of keeping track of how many carbohydrates you eat. Eating carbohydrates increases the amount of sugar (glucose) in the blood. Counting how many carbohydrates you eat improves how well you manage your blood glucose. This, in turn, helps you manage your diabetes. Carbohydrates are measured in grams (g) per serving. It is important to know how many carbohydrates (in grams or by serving size) you can have in each meal. This is different for every person. A dietitian can help you make a meal plan and calculate how many carbohydrates you should have at each meal and snack. What foods contain carbohydrates? Carbohydrates are found in the following foods: Grains, such as breads and cereals. Dried beans and soy products. Starchy vegetables, such as potatoes, peas, and corn. Fruit and fruit juices. Milk and yogurt. Sweets and snack foods, such as cake, cookies, candy, chips, and soft drinks. How do I count carbohydrates in foods? There are two ways to count carbohydrates in food. You can read food labels or learn standard serving sizes of foods. You can use either of these methods or a combination of both. Using the Nutrition Facts label The Nutrition Facts list is included on the labels of almost all packaged foods and beverages in the [place] .  It includes: The serving size. Information about nutrients in each serving, including the grams of carbohydrate per serving. To use the Nutrition Facts, decide how many servings you will have. Then, multiply the number of servings by the number of carbohydrates per serving. The resulting number is the total grams of carbohydrates that you will be having. Learning the standard serving sizes of foods When you eat carbohydrate foods that are not packaged or do not include Nutrition Facts on the label, you need to measure the servings in order to count the grams of carbohydrates. Measure the foods that you will eat with a food scale or measuring cup, if needed. Decide how many standard-size servings you will eat. Multiply the number of servings by 15. For foods that contain carbohydrates, one serving equals 15 g of carbohydrates. For example, if you eat 2 cups or 10 oz (300 g) of strawberries, you will have eaten 2 servings and 30 g of carbohydrates (2 servings x 15 g = 30 g). For foods that have more than one food mixed, such as soups and casseroles, you must count the carbohydrates in each food that is included. The following list contains standard serving sizes of common carbohydrate-rich foods. Each of these servings has about 15 g of carbohydrates: 1 slice of bread. 1 six-inch (15 cm) tortilla. ? cup or 2 oz (53 g) cooked rice or pasta.  cup or 3 oz (85 g) cooked or canned, drained and rinsed beans or lentils.  cup or 3 oz (85 g) starchy vegetable, such as peas, corn, or squash.  cup or 4 oz (120 g) hot cereal.  cup or 3 oz (85 g) boiled or mashed potatoes, or  or 3 oz (85 g) of a large baked potato.  cup or 4 fl oz (118 mL) fruit juice. 1 cup or 8 fl oz (237 mL) milk. 1 small or 4 oz (106 g) apple.  or 2 oz (63 g) of a medium banana. 1 cup or 5 oz (150 g) strawberries. 3 cups or 1 oz (28.3 g) popped popcorn. What is an example of carbohydrate counting? To calculate the grams of  carbohydrates in this sample meal, follow the steps shown below. Sample meal 3 oz (85 g) chicken breast. ? cup or 4 oz (106 g) brown rice.  cup or 3 oz (85 g) corn. 1 cup or 8 fl oz (237 mL) milk. 1 cup or 5 oz (150 g) strawberries with sugar-free whipped topping. Carbohydrate calculation Identify the foods that contain carbohydrates: Rice. Corn. Milk. Strawberries. Calculate how many servings you have of each food: 2 servings rice. 1 serving corn. 1 serving milk. 1 serving strawberries. Multiply each number of servings by 15 g: 2 servings rice x 15 g = 30 g. 1 serving corn x 15 g = 15 g. 1 serving milk x 15 g = 15 g. 1 serving strawberries x 15 g = 15 g. Add together all of the amounts to find the total grams of carbohydrates eaten: 30 g + 15 g + 15 g + 15 g = 75 g of carbohydrates total. What are tips for following this plan? Shopping Develop a meal plan and then make a shopping list. Buy fresh and frozen vegetables, fresh and frozen fruit, dairy, eggs, beans, lentils, and whole grains. Look at food labels. Choose foods that have more fiber and less sugar. Avoid processed foods and foods with added sugars. Meal planning Aim to have the same number of grams of carbohydrates at each meal and for each snack time. Plan to have regular, balanced meals and snacks. Where to find more information American Diabetes Association: diabetes.org Centers for Disease Control and Prevention: TonerPromos.no Academy of Nutrition and Dietetics: eatright.org Association of Diabetes Care & Education Specialists: diabeteseducator.org Summary Carbohydrate counting is a method of keeping track of how many carbohydrates you eat. Eating carbohydrates increases the amount of sugar (glucose) in your blood. Counting how many carbohydrates you eat improves how well you manage your blood glucose. This helps you manage your diabetes. A dietitian can help you make a meal plan and calculate how many  carbohydrates you should have at each meal and snack. This information is not intended to replace advice given to you by your health care provider. Make sure you discuss any questions you have with your health care provider. Document Revised: 02/17/2020 Document Reviewed: 02/18/2020 Elsevier Patient Education  2024 [employer].

## 2024-01-30 ENCOUNTER — Telehealth: Payer: Self-pay | Admitting: *Deleted

## 2024-01-30 ENCOUNTER — Other Ambulatory Visit: Payer: Self-pay

## 2024-01-30 MED ORDER — LANCETS MISC. MISC: 0 refills | Status: AC

## 2024-01-30 MED ORDER — BLOOD GLUCOSE TEST VI STRP: ORAL_STRIP | 5 refills | Status: AC

## 2024-01-30 MED ORDER — LANCET DEVICE MISC: 0 refills | Status: AC

## 2024-01-30 MED ORDER — BLOOD GLUCOSE MONITORING SUPPL DEVI: 0 refills | Status: AC

## 2024-01-30 NOTE — Patient Outreach (Signed)
  Care Management   Follow Up Note   01/30/2024 Name: Brandon French MRN: [id number] DOB: [birth year]   Referred by: Bevely Doffing, FNP Reason for referral : Complex Care Management (F/U on glucometer request)   Patient called requesting update on glucometer request as he has not received any calls from pharmacy or office. RNCM send message to practice administrator and clinical practice lead requesting an update. RNCM will follow up during next outreach with patient on 02-06-2024  Follow Up Plan: The care management team will reach out to the patient again over the next 7 days.   Rosina Forte, BSN RN [facility], [facility] Health RN Care Manager Direct Dial: [phone]  Fax: [phone]

## 2024-02-06 ENCOUNTER — Telehealth: Payer: Self-pay | Admitting: *Deleted

## 2024-02-06 ENCOUNTER — Encounter: Payer: Self-pay | Admitting: *Deleted

## 2024-02-06 ENCOUNTER — Telehealth: Payer: Self-pay

## 2024-02-06 NOTE — Telephone Encounter (Signed)
 Copied from CRM [id number]. Topic: General - Call Back - No Documentation >> Feb 06, 2024  2:28 PM Chiquita SQUIBB wrote: Reason for CRM: Patient is calling Rosina back, please contact the patient back.

## 2024-02-18 ENCOUNTER — Telehealth: Payer: Self-pay

## 2024-02-18 NOTE — Telephone Encounter (Signed)
 Received a refill request fax from [place] patient assistance company for patients FARXIGA  medication.   Please send a 90 day supply (with refills) to [facility] if applicable. Thanks!

## 2024-02-26 ENCOUNTER — Other Ambulatory Visit: Payer: Self-pay

## 2024-02-26 DIAGNOSIS — E1122 Type 2 diabetes mellitus with diabetic chronic kidney disease: Secondary | ICD-10-CM

## 2024-02-26 DIAGNOSIS — N1832 Chronic kidney disease, stage 3b: Secondary | ICD-10-CM

## 2024-02-26 MED ORDER — DAPAGLIFLOZIN PROPANEDIOL 10 MG PO TABS: 10.0000 mg | ORAL_TABLET | Freq: Every day | ORAL | 3 refills | Status: AC

## 2024-02-26 NOTE — Telephone Encounter (Signed)
 Received a refill request fax from [place] patient assistance company for patients FARXIGA  medication.   Please send a 90 day supply (with refills) to [facility] if applicable. Thanks!

## 2024-02-28 ENCOUNTER — Other Ambulatory Visit: Payer: Self-pay | Admitting: Internal Medicine

## 2024-02-28 DIAGNOSIS — E118 Type 2 diabetes mellitus with unspecified complications: Secondary | ICD-10-CM

## 2024-03-04 ENCOUNTER — Other Ambulatory Visit: Payer: Self-pay | Admitting: Internal Medicine

## 2024-03-04 DIAGNOSIS — I10 Essential (primary) hypertension: Secondary | ICD-10-CM

## 2024-04-02 ENCOUNTER — Other Ambulatory Visit: Payer: Self-pay

## 2024-04-02 DIAGNOSIS — D508 Other iron deficiency anemias: Secondary | ICD-10-CM

## 2024-04-02 NOTE — Telephone Encounter (Unsigned)
 Copied from CRM #[id number]. Topic: Clinical - Medication Refill >> Apr 02, 2024 12:01 PM Everette C wrote: Medication: ferrous sulfate  325 (65 FE) MG tablet [[id number]]  Has the patient contacted their pharmacy? No (Agent: If no, request that the patient contact the pharmacy for the refill. If patient does not wish to contact the pharmacy document the reason why and proceed with request.) (Agent: If yes, when and what did the pharmacy advise?)  This is the patient's preferred pharmacy:  [id number] - [place], [place] - [address] AT [facility] [address] [place] [address] Phone: [phone] Fax: [phone]  Is this the correct pharmacy for this prescription? Yes If no, delete pharmacy and type the correct one.   Has the prescription been filled recently? No  Is the patient out of the medication? Yes  Has the patient been seen for an appointment in the last year OR does the patient have an upcoming appointment? Yes  Can we respond through MyChart? No  Agent: Please be advised that Rx refills may take up to 3 business days. We ask that you follow-up with your pharmacy.

## 2024-04-02 NOTE — Telephone Encounter (Signed)
 FYI Only or Action Required?: Action required by provider: medication refill request.  Patient was last seen in primary care on 12/28/2023 by Melvenia Manus BRAVO, MD.  Called Nurse Triage reporting Medication Refill.  Symptoms began today.  Interventions attempted: Nothing.  Symptoms are: stable.  Triage Disposition: No disposition on file.  Patient/caregiver understands and will follow disposition?:

## 2024-04-03 MED ORDER — FERROUS SULFATE 325 (65 FE) MG PO TABS: 325.0000 mg | ORAL_TABLET | Freq: Every day | ORAL | 0 refills | Status: AC

## 2024-04-11 ENCOUNTER — Encounter: Payer: Self-pay | Admitting: *Deleted

## 2024-04-11 ENCOUNTER — Telehealth: Payer: Self-pay | Admitting: *Deleted

## 2024-04-11 NOTE — Patient Instructions (Signed)
 Brandon French - I am sorry I was unable to reach you today for our scheduled appointment. I work with Bevely Doffing, FNP and am calling to support your healthcare needs. Please contact me at [phone] at your earliest convenience. I look forward to speaking with you soon.   Thank you,  Rosina Forte, BSN RN [facility], [facility] RN Care Manager Direct Dial: [phone]  Fax: [phone]

## 2024-04-23 NOTE — Telephone Encounter (Unsigned)
 Copied from CRM #[id number]. Topic: Clinical - Medication Refill >> Apr 23, 2024 10:13 AM Shanda MATSU wrote: Medication: ferrous sulfate  325 (65 FE) MG tablet, atorvastatin  (LIPITOR) 80 MG tablet, Evolocumab  (REPATHA  SURECLICK) 140 MG/ML SOAJ [[id number]]  Has the patient contacted their pharmacy? No (Agent: If no, request that the patient contact the pharmacy for the refill. If patient does not wish to contact the pharmacy document the reason why and proceed with request.) (Agent: If yes, when and what did the pharmacy advise?)  This is the patient's preferred pharmacy:  [id number] - [place], [place] - [address] AT [facility] [address] [place] [address] Phone: [phone] Fax: [phone]  Is this the correct pharmacy for this prescription? Yes If no, delete pharmacy and type the correct one.   Has the prescription been filled recently? No  Is the patient out of the medication? Yes  Has the patient been seen for an appointment in the last year OR does the patient have an upcoming appointment? Yes  Can we respond through MyChart? No  Agent: Please be advised that Rx refills may take up to 3 business days. We ask that you follow-up with your pharmacy.

## 2024-04-30 ENCOUNTER — Encounter: Payer: Self-pay | Admitting: *Deleted

## 2024-04-30 ENCOUNTER — Telehealth: Payer: Self-pay | Admitting: *Deleted

## 2024-04-30 NOTE — Patient Instructions (Signed)
 Brandon French - I have attempted to call you three times but have been unsuccessful in reaching you. I work with Bevely Doffing, FNP and am calling to support your healthcare needs. If I can be of assistance to you, please contact me at [phone].     Thank you,  Rosina Forte, BSN RN [facility], [facility] Health RN Care Manager Direct Dial: [phone]  Fax: [phone]

## 2024-05-02 DIAGNOSIS — R809 Proteinuria, unspecified: Secondary | ICD-10-CM | POA: Diagnosis not present

## 2024-05-02 DIAGNOSIS — E1122 Type 2 diabetes mellitus with diabetic chronic kidney disease: Secondary | ICD-10-CM | POA: Diagnosis not present

## 2024-05-02 DIAGNOSIS — I129 Hypertensive chronic kidney disease with stage 1 through stage 4 chronic kidney disease, or unspecified chronic kidney disease: Secondary | ICD-10-CM | POA: Diagnosis not present

## 2024-05-09 ENCOUNTER — Other Ambulatory Visit: Payer: Self-pay | Admitting: Internal Medicine

## 2024-05-09 DIAGNOSIS — E782 Mixed hyperlipidemia: Secondary | ICD-10-CM

## 2024-05-10 ENCOUNTER — Other Ambulatory Visit: Payer: Self-pay | Admitting: Internal Medicine

## 2024-05-10 DIAGNOSIS — E782 Mixed hyperlipidemia: Secondary | ICD-10-CM

## 2024-06-05 ENCOUNTER — Other Ambulatory Visit: Payer: Self-pay

## 2024-06-05 DIAGNOSIS — I10 Essential (primary) hypertension: Secondary | ICD-10-CM

## 2024-06-09 ENCOUNTER — Other Ambulatory Visit: Payer: Self-pay

## 2024-06-09 ENCOUNTER — Telehealth: Payer: Self-pay

## 2024-06-09 DIAGNOSIS — I10 Essential (primary) hypertension: Secondary | ICD-10-CM

## 2024-06-09 MED ORDER — CHLORTHALIDONE 25 MG PO TABS: 25.0000 mg | ORAL_TABLET | Freq: Every day | ORAL | 1 refills | Status: AC

## 2024-06-09 MED ORDER — NIFEDIPINE ER OSMOTIC RELEASE 60 MG PO TB24: 60.0000 mg | ORAL_TABLET | Freq: Every day | ORAL | 1 refills | Status: AC

## 2024-06-09 NOTE — Telephone Encounter (Signed)
 PATIENTS APPOINTMENT WAS CANCLED 12/5 DUE TO PROVIDER BEING OUT OF OFFICE. PATIENT NEEDS MED REFILLS AND REQUESTING TO BE SEEEN DEC 2 AT 9AM IN PCP CHART SLOT. PLEASE ADVISE

## 2024-06-10 NOTE — Telephone Encounter (Signed)
 Noted.Patient scheduled. I will call  patient to verbally inform him about the appointment.

## 2024-06-25 NOTE — Progress Notes (Signed)
 Brandon French                                          MRN: [id number]   06/25/2024   The VBCI Quality Team Specialist reviewed this patient medical record for the purposes of chart review for care gap closure. The following were reviewed: chart review for care gap closure-kidney health evaluation for diabetes:eGFR  and uACR.    VBCI Quality Team

## 2024-06-25 NOTE — Progress Notes (Signed)
 Brandon French                                          MRN: [id number]   06/25/2024   The [facility] Quality Team Specialist reviewed this patient medical record for the purposes of chart review for care gap closure. The following were reviewed: abstraction for care gap closure-glycemic status assessment.    [facility] Quality Team

## 2024-07-01 ENCOUNTER — Ambulatory Visit

## 2024-07-04 ENCOUNTER — Ambulatory Visit

## 2024-07-08 ENCOUNTER — Telehealth: Payer: Self-pay

## 2024-07-08 NOTE — Telephone Encounter (Signed)
 Patient enrolled with [employer] patient assistance for Farxiga  until 07/30/24.

## 2024-07-18 ENCOUNTER — Ambulatory Visit (INDEPENDENT_AMBULATORY_CARE_PROVIDER_SITE_OTHER)

## 2024-07-18 VITALS — BP 119/60 | HR 57 | Ht 72.0 in | Wt 309.1 lb

## 2024-07-18 DIAGNOSIS — I10 Essential (primary) hypertension: Secondary | ICD-10-CM | POA: Diagnosis not present

## 2024-07-18 DIAGNOSIS — N1832 Chronic kidney disease, stage 3b: Secondary | ICD-10-CM

## 2024-07-18 DIAGNOSIS — Z7984 Long term (current) use of oral hypoglycemic drugs: Secondary | ICD-10-CM

## 2024-07-18 DIAGNOSIS — E782 Mixed hyperlipidemia: Secondary | ICD-10-CM | POA: Diagnosis not present

## 2024-07-18 DIAGNOSIS — Z23 Encounter for immunization: Secondary | ICD-10-CM

## 2024-07-18 DIAGNOSIS — I639 Cerebral infarction, unspecified: Secondary | ICD-10-CM

## 2024-07-18 DIAGNOSIS — Z8673 Personal history of transient ischemic attack (TIA), and cerebral infarction without residual deficits: Secondary | ICD-10-CM | POA: Diagnosis not present

## 2024-07-18 DIAGNOSIS — E1122 Type 2 diabetes mellitus with diabetic chronic kidney disease: Secondary | ICD-10-CM | POA: Diagnosis not present

## 2024-07-18 MED ORDER — CLOPIDOGREL BISULFATE 75 MG PO TABS: 75.0000 mg | ORAL_TABLET | Freq: Every day | ORAL | 3 refills | Status: AC

## 2024-07-18 NOTE — Progress Notes (Unsigned)
 "  Established Patient Office Visit  Subjective   Patient ID: Brandon French, male    DOB: 03-Jan-1966  Age: 58 y.o. MRN: [id number]  Chief Complaint  Patient presents with   Medical Management of Chronic Issues    Pt is here for a follow up and Pt needs a refill on a medication that he threw away and he does not know the name of it or what it was for     HPI Discussed the use of AI scribe software for clinical note transcription with the patient, who gave verbal consent to proceed.  History of Present Illness    Brandon French is a 58 year old male with hypertension and coronary artery disease who presents for a follow-up visit and medication refills.  Hypertension and coronary artery disease - No current symptoms or concerns related to blood pressure or cardiac status - Blood pressure is well controlled - Medication regimen includes hydralazine  100 mg, with an adequate supply - Recently received Farxiga  10 mg - Uncertain about the need for clopidogrel  (Plavix ) 75 mg - Discontinued one medication (100 mg), unable to recall the name  Peripheral edema - Intermittent swelling in the left leg - Edema has decreased recently  Weight loss - Significant weight reduction from a previous weight of 440 pounds to 309 pounds     Patient Active Problem List   Diagnosis Date Noted   Encounter for HCV screening test for low risk patient 09/01/2022   Preventative health care 05/18/2022   Palpitations 05/02/2022   Suspected cardiomyopathy 05/02/2022   Need for immunization against influenza 04/18/2022   Bilateral lower extremity edema 04/18/2022   Diabetes mellitus with coincident hypertension (HCC) 08/25/2021   CKD (chronic kidney disease) stage 3, GFR 30-59 ml/min (HCC) 08/25/2021   Benign prostatic hyperplasia without lower urinary tract symptoms 08/25/2021   Insomnia 08/25/2021   Screening for colon cancer 08/25/2021   Hypertrophic cardiomyopathy (HCC) 06/09/2021   CVA (cerebral  vascular accident) (HCC) 05/20/2021   Dyslipidemia    Dysphagia, post-stroke    Acute CVA (cerebrovascular accident) (HCC) 05/17/2021   Stroke (HCC) 05/16/2021   DMII (diabetes mellitus, type 2) (HCC) 05/16/2021   Hypertension 05/16/2021   HLD (hyperlipidemia) 05/16/2021    ROS    Objective:     BP 119/60   Pulse (!) 57   Ht 6' (1.829 m)   Wt (!) 309 lb 1.9 oz (140.2 kg)   SpO2 94%   BMI 41.92 kg/m  BP Readings from Last 3 Encounters:  07/18/24 119/60  12/31/23 129/65  12/28/23 129/62   Wt Readings from Last 3 Encounters:  07/18/24 (!) 309 lb 1.9 oz (140.2 kg)  12/31/23 (!) 307 lb (139.3 kg)  12/28/23 (!) 307 lb 6.4 oz (139.4 kg)      Physical Exam Vitals and nursing note reviewed.  Constitutional:      Appearance: Normal appearance.  HENT:     Head: Normocephalic.  Eyes:     Extraocular Movements: Extraocular movements intact.     Pupils: Pupils are equal, round, and reactive to light.  Cardiovascular:     Rate and Rhythm: Normal rate and regular rhythm.  Pulmonary:     Effort: Pulmonary effort is normal.     Breath sounds: Normal breath sounds.  Musculoskeletal:     Cervical back: Normal range of motion and neck supple.  Neurological:     Mental Status: He is alert and oriented to person, place, and time.  Psychiatric:  Mood and Affect: Mood normal.        Thought Content: Thought content normal.     Last CBC Lab Results  Component Value Date   WBC 7.5 07/18/2024   HGB 12.6 (L) 07/18/2024   HCT 41.4 07/18/2024   MCV 84 07/18/2024   MCH 25.6 (L) 07/18/2024   RDW 14.4 07/18/2024   PLT 371 07/18/2024   Last metabolic panel Lab Results  Component Value Date   GLUCOSE 149 (H) 07/18/2024   NA 142 07/18/2024   K 3.6 07/18/2024   CL 102 07/18/2024   CO2 27 07/18/2024   BUN 38 (H) 07/18/2024   CREATININE 2.43 (H) 07/18/2024   EGFR 30 (L) 07/18/2024   CALCIUM  9.3 07/18/2024   PHOS 2.9 05/19/2021   PROT 6.6 07/18/2024   ALBUMIN 3.7 (L)  07/18/2024   LABGLOB 2.9 07/18/2024   AGRATIO 1.2 03/16/2022   BILITOT 0.3 07/18/2024   ALKPHOS 88 07/18/2024   AST 20 07/18/2024   ALT 17 07/18/2024   ANIONGAP 5 06/06/2021   Last lipids Lab Results  Component Value Date   CHOL 115 07/18/2024   HDL 43 07/18/2024   LDLCALC 57 07/18/2024   LDLDIRECT 58 12/05/2022   TRIG 73 07/18/2024   CHOLHDL 2.7 07/18/2024   Last hemoglobin A1c Lab Results  Component Value Date   HGBA1C 6.4 (H) 07/18/2024   Last thyroid  functions Lab Results  Component Value Date   TSH 1.200 05/03/2023   FREET4 1.23 05/03/2023   Last vitamin D  Lab Results  Component Value Date   VD25OH 29.9 (L) 05/03/2023   Last vitamin B12 and Folate Lab Results  Component Value Date   VITAMINB12 462 05/03/2023   FOLATE 10.2 05/03/2023      The ASCVD Risk score (Arnett DK, et al., 2019) failed to calculate for the following reasons:   Risk score cannot be calculated because patient has a medical history suggesting prior/existing ASCVD   * - Cholesterol units were assumed    Assessment & Plan:   Problem List Items Addressed This Visit       Cardiovascular and Mediastinum   Hypertension   Adequately controlled on current antihypertensive regimen consisting of Procardia  XL 60 mg daily, chlorthalidone  25 mg daily, clonidine  0.3 mg twice daily, hydralazine  100 mg 3 times daily, and Toprol -XL 50 mg daily.  No medication changes are indicated at this time.      Relevant Orders   CBC with Differential (Completed)   CMP14+EGFR (Completed)   CVA (cerebral vascular accident) (HCC)   History of CVA in October 2022 (left internal capsule/lateral thalamus).  He remains on Plavix  monotherapy and atorvastatin .  No changes have been made today.      Relevant Medications   clopidogrel  (PLAVIX ) 75 MG tablet     Endocrine   DMII (diabetes mellitus, type 2) (HCC) - Primary   He is currently managed with Farxiga  10 mg daily.  Repeat A1c ordered today.       Relevant Orders   HgB A1c (Completed)   Microalbumin / creatinine urine ratio (Completed)   CBC with Differential (Completed)   CMP14+EGFR (Completed)     Genitourinary   CKD (chronic kidney disease) stage 3, GFR 30-59 ml/min (HCC)   Renal function stable on labs from October 2024.  He was seen by nephrology for follow-up in February.  His next appointment is scheduled for 6/9.  He remains on Farxiga .  Repeat BMP ordered today.      Relevant Medications  clopidogrel  (PLAVIX ) 75 MG tablet   Other Relevant Orders   Microalbumin / creatinine urine ratio (Completed)   CMP14+EGFR (Completed)     Other   HLD (hyperlipidemia)   He is currently prescribed Repatha , atorvastatin  80 mg daily, and Zetia  10 mg daily.  Repeat lipid panel ordered today.      Relevant Orders   Lipid Profile (Completed)   CMP14+EGFR (Completed)   Other Visit Diagnoses       Encounter for immunization       Relevant Orders   Flu vaccine trivalent PF, 6mos and older(Flulaval,Afluria,Fluarix,Fluzone) (Completed)      Return in about 6 months (around 01/16/2025) for chronic follow-up with PCP.    Leita Longs, FNP  "

## 2024-07-20 LAB — CMP14+EGFR
ALT: 17 IU/L (ref 0–44)
AST: 20 IU/L (ref 0–40)
Albumin: 3.7 g/dL — ABNORMAL LOW (ref 3.8–4.9)
Alkaline Phosphatase: 88 IU/L (ref 47–123)
BUN/Creatinine Ratio: 16 (ref 9–20)
BUN: 38 mg/dL — ABNORMAL HIGH (ref 6–24)
Bilirubin Total: 0.3 mg/dL (ref 0.0–1.2)
CO2: 27 mmol/L (ref 20–29)
Calcium: 9.3 mg/dL (ref 8.7–10.2)
Chloride: 102 mmol/L (ref 96–106)
Creatinine, Ser: 2.43 mg/dL — ABNORMAL HIGH (ref 0.76–1.27)
Globulin, Total: 2.9 g/dL (ref 1.5–4.5)
Glucose: 149 mg/dL — ABNORMAL HIGH (ref 70–99)
Potassium: 3.6 mmol/L (ref 3.5–5.2)
Sodium: 142 mmol/L (ref 134–144)
Total Protein: 6.6 g/dL (ref 6.0–8.5)
eGFR: 30 mL/min/1.73 — ABNORMAL LOW

## 2024-07-20 LAB — LIPID PANEL
Chol/HDL Ratio: 2.7 ratio (ref 0.0–5.0)
Cholesterol, Total: 115 mg/dL (ref 100–199)
HDL: 43 mg/dL
LDL Chol Calc (NIH): 57 mg/dL (ref 0–99)
Triglycerides: 73 mg/dL (ref 0–149)
VLDL Cholesterol Cal: 15 mg/dL (ref 5–40)

## 2024-07-20 LAB — CBC WITH DIFFERENTIAL/PLATELET
Basophils Absolute: 0.1 x10E3/uL (ref 0.0–0.2)
Basos: 1 %
EOS (ABSOLUTE): 0.2 x10E3/uL (ref 0.0–0.4)
Eos: 2 %
Hematocrit: 41.4 % (ref 37.5–51.0)
Hemoglobin: 12.6 g/dL — ABNORMAL LOW (ref 13.0–17.7)
Immature Grans (Abs): 0 x10E3/uL (ref 0.0–0.1)
Immature Granulocytes: 0 %
Lymphocytes Absolute: 1.5 x10E3/uL (ref 0.7–3.1)
Lymphs: 20 %
MCH: 25.6 pg — ABNORMAL LOW (ref 26.6–33.0)
MCHC: 30.4 g/dL — ABNORMAL LOW (ref 31.5–35.7)
MCV: 84 fL (ref 79–97)
Monocytes Absolute: 0.6 x10E3/uL (ref 0.1–0.9)
Monocytes: 8 %
Neutrophils Absolute: 5.2 x10E3/uL (ref 1.4–7.0)
Neutrophils: 69 %
Platelets: 371 x10E3/uL (ref 150–450)
RBC: 4.92 x10E6/uL (ref 4.14–5.80)
RDW: 14.4 % (ref 11.6–15.4)
WBC: 7.5 x10E3/uL (ref 3.4–10.8)

## 2024-07-20 LAB — HEMOGLOBIN A1C
Est. average glucose Bld gHb Est-mCnc: 137 mg/dL
Hgb A1c MFr Bld: 6.4 % — ABNORMAL HIGH (ref 4.8–5.6)

## 2024-07-20 LAB — MICROALBUMIN / CREATININE URINE RATIO
Creatinine, Urine: 87.8 mg/dL
Microalb/Creat Ratio: 45 mg/g{creat} — AB (ref 0–29)
Microalbumin, Urine: 39.5 ug/mL

## 2024-07-20 NOTE — Assessment & Plan Note (Signed)
 Adequately controlled on current antihypertensive regimen consisting of Procardia  XL 60 mg daily, chlorthalidone  25 mg daily, clonidine  0.3 mg twice daily, hydralazine  100 mg 3 times daily, and Toprol -XL 50 mg daily.  No medication changes are indicated at this time.

## 2024-07-20 NOTE — Assessment & Plan Note (Signed)
 Renal function stable on labs from October 2024.  He was seen by nephrology for follow-up in February.  His next appointment is scheduled for 6/9.  He remains on Farxiga .  Repeat BMP ordered today.

## 2024-07-20 NOTE — Assessment & Plan Note (Signed)
History of CVA in October 2022 (left internal capsule/lateral thalamus).  He remains on Plavix monotherapy and atorvastatin.  No changes have been made today.

## 2024-07-20 NOTE — Assessment & Plan Note (Signed)
 He is currently prescribed Repatha , atorvastatin  80 mg daily, and Zetia  10 mg daily.  Repeat lipid panel ordered today.

## 2024-07-20 NOTE — Assessment & Plan Note (Signed)
 He is currently managed with Farxiga  10 mg daily.  Repeat A1c ordered today.

## 2024-08-14 ENCOUNTER — Telehealth: Payer: Self-pay

## 2024-08-14 NOTE — Telephone Encounter (Signed)
No phone number provided.

## 2024-08-14 NOTE — Telephone Encounter (Signed)
 Copied from CRM [id number]. Topic: General - Other >> Aug 13, 2024  4:44 PM Victoria B wrote: Reason for CRM: Caron from [employer] , asking for patients chronic health dx

## 2024-08-15 ENCOUNTER — Other Ambulatory Visit: Payer: Self-pay | Admitting: Internal Medicine

## 2024-08-15 DIAGNOSIS — I10 Essential (primary) hypertension: Secondary | ICD-10-CM

## 2024-08-18 ENCOUNTER — Telehealth: Payer: Self-pay

## 2024-08-18 NOTE — Telephone Encounter (Signed)
 Was this received? Copied from CRM #[id number]. Topic: General - Other >> Aug 15, 2024 10:42 AM Thersia BROCKS wrote: Reason for CRM: [employer] - applying for chronic condition program  did send fax yesterday  will be re faxing >> Aug 18, 2024  9:51 AM Hadassah PARAS wrote: Ezzard from [facility] sent fax over Jan 16th requesting pt's chronic condition in order to qualify for a chronic condition special needs plan.  Phone #[phone] OPT 4  Fax [phone] >> Aug 15, 2024 11:04 AM Katheryn POUR wrote: Received gave to provider

## 2024-08-18 NOTE — Telephone Encounter (Signed)
 Signed, faxed, sent to scan

## 2024-12-31 ENCOUNTER — Ambulatory Visit

## 2025-01-02 ENCOUNTER — Ambulatory Visit

## 2025-01-16 ENCOUNTER — Ambulatory Visit: Payer: Self-pay
